# Patient Record
Sex: Female | Born: 1935
Health system: Southern US, Community
[De-identification: ages and names within clinical notes are randomized; demographics above are authoritative.]

## PROBLEM LIST (undated history)

## (undated) DIAGNOSIS — M179 Osteoarthritis of knee, unspecified: Secondary | ICD-10-CM

## (undated) DIAGNOSIS — K635 Polyp of colon: Secondary | ICD-10-CM

## (undated) DIAGNOSIS — E785 Hyperlipidemia, unspecified: Secondary | ICD-10-CM

## (undated) DIAGNOSIS — K5730 Diverticulosis of large intestine without perforation or abscess without bleeding: Secondary | ICD-10-CM

## (undated) DIAGNOSIS — M8580 Other specified disorders of bone density and structure, unspecified site: Secondary | ICD-10-CM

## (undated) DIAGNOSIS — M419 Scoliosis, unspecified: Secondary | ICD-10-CM

## (undated) DIAGNOSIS — IMO0001 Reserved for inherently not codable concepts without codable children: Secondary | ICD-10-CM

## (undated) DIAGNOSIS — D369 Benign neoplasm, unspecified site: Secondary | ICD-10-CM

## (undated) DIAGNOSIS — Z9889 Other specified postprocedural states: Secondary | ICD-10-CM

## (undated) DIAGNOSIS — K589 Irritable bowel syndrome without diarrhea: Secondary | ICD-10-CM

## (undated) DIAGNOSIS — K219 Gastro-esophageal reflux disease without esophagitis: Secondary | ICD-10-CM

## (undated) DIAGNOSIS — M1710 Unilateral primary osteoarthritis, unspecified knee: Secondary | ICD-10-CM

## (undated) DIAGNOSIS — M7060 Trochanteric bursitis, unspecified hip: Secondary | ICD-10-CM

## (undated) DIAGNOSIS — G8929 Other chronic pain: Secondary | ICD-10-CM

## (undated) DIAGNOSIS — R2681 Unsteadiness on feet: Secondary | ICD-10-CM

## (undated) DIAGNOSIS — G629 Polyneuropathy, unspecified: Secondary | ICD-10-CM

## (undated) DIAGNOSIS — M26629 Arthralgia of temporomandibular joint, unspecified side: Secondary | ICD-10-CM

## (undated) DIAGNOSIS — M5030 Other cervical disc degeneration, unspecified cervical region: Secondary | ICD-10-CM

## (undated) DIAGNOSIS — R112 Nausea with vomiting, unspecified: Secondary | ICD-10-CM

## (undated) DIAGNOSIS — M79606 Pain in leg, unspecified: Secondary | ICD-10-CM

## (undated) HISTORY — DX: Scoliosis, unspecified: M41.9

## (undated) HISTORY — DX: Arthralgia of temporomandibular joint, unspecified side: M26.629

## (undated) HISTORY — DX: Benign neoplasm, unspecified site: D36.9

## (undated) HISTORY — DX: Trochanteric bursitis, unspecified hip: M70.60

## (undated) HISTORY — DX: Hyperlipidemia, unspecified: E78.5

## (undated) HISTORY — DX: Other specified disorders of bone density and structure, unspecified site: M85.80

## (undated) HISTORY — DX: Polyp of colon: K63.5

## (undated) HISTORY — DX: Osteoarthritis of knee, unspecified: M17.9

## (undated) HISTORY — DX: Diverticulosis of large intestine without perforation or abscess without bleeding: K57.30

## (undated) HISTORY — DX: Irritable bowel syndrome, unspecified: K58.9

## (undated) HISTORY — PX: TOTAL ABDOMINAL HYSTERECTOMY: SHX209

## (undated) HISTORY — DX: Gastro-esophageal reflux disease without esophagitis: K21.9

## (undated) HISTORY — PX: REPLACEMENT TOTAL KNEE: SUR1224

## (undated) HISTORY — DX: Unilateral primary osteoarthritis, unspecified knee: M17.10

## (undated) HISTORY — DX: Pain in leg, unspecified: M79.606

## (undated) HISTORY — PX: HEMORRHOID SURGERY: SHX153

## (undated) HISTORY — DX: Other chronic pain: G89.29

---

## 1999-04-01 ENCOUNTER — Inpatient Hospital Stay (HOSPITAL_COMMUNITY)
Admission: RE | Admit: 1999-04-01 | Discharge: 1999-04-10 | Payer: Self-pay | Admitting: Physical Medicine and Rehabilitation

## 1999-04-01 ENCOUNTER — Encounter: Payer: Self-pay | Admitting: Physical Medicine and Rehabilitation

## 1999-04-02 ENCOUNTER — Encounter: Payer: Self-pay | Admitting: Physical Medicine and Rehabilitation

## 1999-04-03 ENCOUNTER — Encounter: Payer: Self-pay | Admitting: Physical Medicine and Rehabilitation

## 1999-04-09 ENCOUNTER — Encounter: Payer: Self-pay | Admitting: Physical Medicine and Rehabilitation

## 1999-12-02 HISTORY — PX: CEREBRAL ANEURYSM REPAIR: SHX164

## 2004-05-01 ENCOUNTER — Other Ambulatory Visit: Admission: RE | Admit: 2004-05-01 | Discharge: 2004-05-01 | Payer: Self-pay | Admitting: Family Medicine

## 2004-05-01 ENCOUNTER — Encounter (INDEPENDENT_AMBULATORY_CARE_PROVIDER_SITE_OTHER): Payer: Self-pay | Admitting: *Deleted

## 2004-05-01 LAB — CONVERTED CEMR LAB: Pap Smear: NEGATIVE

## 2004-10-11 ENCOUNTER — Ambulatory Visit: Payer: Self-pay | Admitting: Family Medicine

## 2005-10-10 ENCOUNTER — Ambulatory Visit: Payer: Self-pay | Admitting: Family Medicine

## 2005-12-30 ENCOUNTER — Ambulatory Visit: Payer: Self-pay | Admitting: Family Medicine

## 2006-02-26 ENCOUNTER — Ambulatory Visit: Payer: Self-pay | Admitting: Family Medicine

## 2006-08-06 ENCOUNTER — Ambulatory Visit: Payer: Self-pay | Admitting: Rheumatology

## 2006-12-04 ENCOUNTER — Ambulatory Visit: Payer: Self-pay | Admitting: Family Medicine

## 2007-05-26 ENCOUNTER — Ambulatory Visit: Payer: Self-pay | Admitting: Internal Medicine

## 2008-11-20 ENCOUNTER — Ambulatory Visit: Payer: Self-pay | Admitting: Family Medicine

## 2008-12-01 HISTORY — PX: FOOT SURGERY: SHX648

## 2009-02-06 ENCOUNTER — Encounter: Payer: Self-pay | Admitting: Internal Medicine

## 2009-04-25 ENCOUNTER — Encounter: Payer: Self-pay | Admitting: Internal Medicine

## 2009-05-03 ENCOUNTER — Ambulatory Visit: Payer: Self-pay | Admitting: Rheumatology

## 2009-07-24 ENCOUNTER — Ambulatory Visit: Payer: Self-pay | Admitting: Family Medicine

## 2009-07-24 ENCOUNTER — Encounter (INDEPENDENT_AMBULATORY_CARE_PROVIDER_SITE_OTHER): Payer: Self-pay | Admitting: *Deleted

## 2009-07-24 DIAGNOSIS — K5790 Diverticulosis of intestine, part unspecified, without perforation or abscess without bleeding: Secondary | ICD-10-CM | POA: Insufficient documentation

## 2009-07-24 DIAGNOSIS — Z860100 Personal history of colon polyps, unspecified: Secondary | ICD-10-CM | POA: Insufficient documentation

## 2009-07-24 DIAGNOSIS — Z780 Asymptomatic menopausal state: Secondary | ICD-10-CM | POA: Insufficient documentation

## 2009-07-24 DIAGNOSIS — E781 Pure hyperglyceridemia: Secondary | ICD-10-CM | POA: Insufficient documentation

## 2009-07-24 DIAGNOSIS — M1990 Unspecified osteoarthritis, unspecified site: Secondary | ICD-10-CM | POA: Insufficient documentation

## 2009-07-24 DIAGNOSIS — K5730 Diverticulosis of large intestine without perforation or abscess without bleeding: Secondary | ICD-10-CM | POA: Insufficient documentation

## 2009-07-25 ENCOUNTER — Encounter: Payer: Self-pay | Admitting: Family Medicine

## 2009-08-03 ENCOUNTER — Encounter: Payer: Self-pay | Admitting: Family Medicine

## 2009-08-14 ENCOUNTER — Encounter (INDEPENDENT_AMBULATORY_CARE_PROVIDER_SITE_OTHER): Payer: Self-pay | Admitting: *Deleted

## 2009-08-27 ENCOUNTER — Ambulatory Visit: Payer: Self-pay | Admitting: Family Medicine

## 2009-08-27 DIAGNOSIS — M810 Age-related osteoporosis without current pathological fracture: Secondary | ICD-10-CM | POA: Insufficient documentation

## 2009-11-13 ENCOUNTER — Encounter: Payer: Self-pay | Admitting: Family Medicine

## 2009-12-01 HISTORY — PX: COLONOSCOPY: SHX174

## 2009-12-25 ENCOUNTER — Encounter: Payer: Self-pay | Admitting: Family Medicine

## 2009-12-25 ENCOUNTER — Ambulatory Visit: Payer: Self-pay | Admitting: Unknown Physician Specialty

## 2009-12-25 LAB — HM COLONOSCOPY

## 2010-03-20 ENCOUNTER — Encounter: Payer: Self-pay | Admitting: Family Medicine

## 2010-03-26 ENCOUNTER — Ambulatory Visit: Payer: Self-pay | Admitting: Family Medicine

## 2010-03-26 DIAGNOSIS — K589 Irritable bowel syndrome without diarrhea: Secondary | ICD-10-CM | POA: Insufficient documentation

## 2010-03-26 DIAGNOSIS — K3189 Other diseases of stomach and duodenum: Secondary | ICD-10-CM | POA: Insufficient documentation

## 2010-03-26 DIAGNOSIS — K219 Gastro-esophageal reflux disease without esophagitis: Secondary | ICD-10-CM | POA: Insufficient documentation

## 2010-03-26 DIAGNOSIS — R1013 Epigastric pain: Secondary | ICD-10-CM

## 2010-04-24 ENCOUNTER — Encounter: Payer: Self-pay | Admitting: Family Medicine

## 2010-05-28 ENCOUNTER — Telehealth: Payer: Self-pay | Admitting: Family Medicine

## 2010-08-09 ENCOUNTER — Telehealth (INDEPENDENT_AMBULATORY_CARE_PROVIDER_SITE_OTHER): Payer: Self-pay | Admitting: *Deleted

## 2010-08-13 ENCOUNTER — Ambulatory Visit: Payer: Self-pay | Admitting: Family Medicine

## 2010-08-13 LAB — CONVERTED CEMR LAB
ALT: 12 units/L (ref 0–35)
AST: 15 units/L (ref 0–37)
Albumin: 3.8 g/dL (ref 3.5–5.2)
BUN: 11 mg/dL (ref 6–23)
Basophils Absolute: 0 10*3/uL (ref 0.0–0.1)
Basophils Relative: 0.5 % (ref 0.0–3.0)
CO2: 26 meq/L (ref 19–32)
Calcium: 8.9 mg/dL (ref 8.4–10.5)
Chloride: 107 meq/L (ref 96–112)
Cholesterol: 228 mg/dL — ABNORMAL HIGH (ref 0–200)
Creatinine, Ser: 0.8 mg/dL (ref 0.4–1.2)
Direct LDL: 138.6 mg/dL
Eosinophils Absolute: 0.2 10*3/uL (ref 0.0–0.7)
Eosinophils Relative: 2.2 % (ref 0.0–5.0)
GFR calc non Af Amer: 76.18 mL/min (ref >60)
Glucose, Bld: 83 mg/dL (ref 70–99)
HCT: 38 % (ref 36.0–46.0)
HDL: 40.7 mg/dL (ref >39.00)
Hemoglobin: 13.1 g/dL (ref 12.0–15.0)
Lymphocytes Relative: 22.7 % (ref 12.0–46.0)
Lymphs Abs: 1.6 10*3/uL (ref 0.7–4.0)
MCHC: 34.6 g/dL (ref 30.0–36.0)
MCV: 88 fL (ref 78.0–100.0)
Monocytes Absolute: 0.4 10*3/uL (ref 0.1–1.0)
Monocytes Relative: 6.2 % (ref 3.0–12.0)
Neutro Abs: 4.7 10*3/uL (ref 1.4–7.7)
Neutrophils Relative %: 68.4 % (ref 43.0–77.0)
Phosphorus: 3.2 mg/dL (ref 2.3–4.6)
Platelets: 268 10*3/uL (ref 150.0–400.0)
Potassium: 3.8 meq/L (ref 3.5–5.1)
RBC: 4.32 M/uL (ref 3.87–5.11)
RDW: 13.9 % (ref 11.5–14.6)
Sodium: 142 meq/L (ref 135–145)
TSH: 1.23 microintl units/mL (ref 0.35–5.50)
Total CHOL/HDL Ratio: 6
Triglycerides: 268 mg/dL — ABNORMAL HIGH (ref 0.0–149.0)
VLDL: 53.6 mg/dL — ABNORMAL HIGH (ref 0.0–40.0)
WBC: 6.9 10*3/uL (ref 4.5–10.5)

## 2010-08-14 LAB — CONVERTED CEMR LAB: Vit D, 25-Hydroxy: 12 ng/mL — ABNORMAL LOW (ref 30–89)

## 2010-08-16 ENCOUNTER — Ambulatory Visit: Payer: Self-pay | Admitting: Family Medicine

## 2010-08-16 DIAGNOSIS — E559 Vitamin D deficiency, unspecified: Secondary | ICD-10-CM | POA: Insufficient documentation

## 2010-08-21 ENCOUNTER — Telehealth: Payer: Self-pay | Admitting: Family Medicine

## 2010-08-22 ENCOUNTER — Encounter: Payer: Self-pay | Admitting: Family Medicine

## 2010-08-23 ENCOUNTER — Telehealth: Payer: Self-pay | Admitting: Family Medicine

## 2010-08-24 ENCOUNTER — Ambulatory Visit: Payer: Self-pay | Admitting: Family Medicine

## 2010-09-04 ENCOUNTER — Encounter: Payer: Self-pay | Admitting: Family Medicine

## 2010-09-05 ENCOUNTER — Telehealth: Payer: Self-pay | Admitting: Family Medicine

## 2010-09-13 ENCOUNTER — Telehealth: Payer: Self-pay | Admitting: Family Medicine

## 2010-09-13 DIAGNOSIS — N63 Unspecified lump in unspecified breast: Secondary | ICD-10-CM | POA: Insufficient documentation

## 2010-09-20 ENCOUNTER — Telehealth: Payer: Self-pay | Admitting: Family Medicine

## 2010-09-24 ENCOUNTER — Encounter: Payer: Self-pay | Admitting: Family Medicine

## 2010-11-12 ENCOUNTER — Ambulatory Visit: Payer: Self-pay | Admitting: Family Medicine

## 2010-11-13 ENCOUNTER — Encounter: Payer: Self-pay | Admitting: Family Medicine

## 2010-11-18 LAB — CONVERTED CEMR LAB: Vit D, 25-Hydroxy: 15 ng/mL — ABNORMAL LOW (ref 30–89)

## 2010-12-10 ENCOUNTER — Ambulatory Visit
Admission: RE | Admit: 2010-12-10 | Discharge: 2010-12-10 | Payer: Self-pay | Source: Home / Self Care | Attending: Family Medicine | Admitting: Family Medicine

## 2010-12-10 DIAGNOSIS — M545 Low back pain, unspecified: Secondary | ICD-10-CM | POA: Insufficient documentation

## 2010-12-13 ENCOUNTER — Telehealth: Payer: Self-pay | Admitting: Family Medicine

## 2010-12-31 NOTE — Letter (Signed)
Summary: [facility]/Recheck-Rheumatology/Dr. [facility]/Recheck-[facility]heumatology/Dr. [facility]   Imported By: Eleonore Chiquito 02/13/2009 14:16:51  _____________________________________________________________________  External Attachment:    Type:   Image     Comment:   External Document  Appended Document: [facility]/Recheck-Rheumatology/Dr. [facility] right shoulder injected for subacromial bursitis

## 2010-12-31 NOTE — Assessment & Plan Note (Signed)
Summary: follow up - mult issues   Vital Signs:  Patient profile:   77 year old female Height:      61.25 inches Weight:      178 pounds BMI:     33.48 Temp:     98 degrees F oral Pulse rate:   96 / minute Pulse rhythm:   regular BP sitting:   130 / 80  (left arm) Cuff size:   regular  Vitals Entered By: Liane Comber CMA (July 24, 2009 2:12 PM)  History of Present Illness: here for check up today- mult med issues - and review health mt list  has been feeling ok overall  is worried about wt gain - but wt is stable   has bad diet -- overall some junk food and does not watch it  has not been active -- foot surgery on both feet- is getting better  still some pain- but thinks she could start some exercise- walking  rides a stationary bike on and off  does not tolerate much    lipids -- in past LDL in 130s does not watch diet and not motivated to do so   injured her rotator cuff and had to PT which has helped   bp is good  colonosc 8/05(Dr Markham Jordan)- had polyp and mother had colon ca   last mam 01 -- ? if had one and did not send to me  needs to schedule no lumps on self exam   pap 05 - and no problems at all  no hx of abn paps and has hx of hyst   long time since bone density test -- ? 10 years  takes ca and D occas -does not remember  lost 2 inches of height at least  has not broken any bones   has urgent stools after she eats  ? IBS  is afraid to go anywhere- occ has accidents takes kaopectate occ - if she has to go somewhere   does not want pneumonia vaccine   already had shingles years ago -- does not think she wants vaccine        Allergies (verified): No Known Drug Allergies  Past History:  Family History: Last updated: 07/24/2009 Mother- Colon CA Brother- CVA? HTN, heart problem Brother- ColonCA Maternal Aunt- Colon [place]  Social History: Last updated: 07/24/2009 Retired Married Former Smoker Alcohol use-no Drug use-no Regular  exercise-yes  Risk Factors: Exercise: yes (07/24/2009)  Risk Factors: Smoking Status: quit (07/24/2009)  Past Medical History: GERD, reflux esophagitis/ duodenitis Chronic leg pain/ troch bursitis Osteoarthritis -knees TMJ Seb dermatitis Hyperlipidemia Colonic polyps, hx of Diverticulosis, colon internal  adenoma hemorrhoids scoliosis chronic diarrhea/urgent stools (? IBS)  Past Surgical History: Hysterectomy- total surgery- cerebral/ aneurysm hemprrhoid surgery Dexa Colonoscopy([facility]) colonoscopy (polyp int hemm, diverticulosis) 8/05 EGD- esophagitis foot surg bilat 2010 - hammer toes and "knot"  Review of Systems General:  Denies fatigue, fever, loss of appetite, and malaise. Eyes:  Denies eye pain. CV:  Denies chest pain or discomfort, lightheadness, palpitations, and shortness of breath with exertion. Resp:  Denies cough and wheezing. GI:  Denies abdominal pain, bloody stools, change in bowel habits, and indigestion. GU:  Denies hematuria and urinary frequency. MS:  Denies joint pain, joint redness, and joint swelling. Derm:  Denies itching, lesion(s), poor wound healing, and rash. Neuro:  Denies numbness and tingling. Psych:  mood is generally ok . Endo:  Denies cold intolerance, excessive thirst, excessive urination, and heat intolerance. Heme:  Denies abnormal  bruising and bleeding.  Physical Exam  General:  overweight but generally well appearing  Head:  normocephalic, atraumatic, and no abnormalities observed.   Eyes:  vision grossly intact, pupils equal, pupils round, and pupils reactive to light.  no conjunctival pallor, injection or icterus  Ears:  R ear normal and L ear normal.   Nose:  no nasal discharge.   Mouth:  pharynx pink and moist.   Neck:  supple with full rom and no masses or thyromegally, no JVD or carotid bruit  Chest Wall:  No deformities, masses, or tenderness noted. Breasts:  No mass, nodules, thickening, tenderness, bulging,  retraction, inflamation, nipple discharge or skin changes noted.   Lungs:  Normal respiratory effort, chest expands symmetrically. Lungs are clear to auscultation, no crackles or wheezes. Heart:  Normal rate and regular rhythm. S1 and S2 normal without gallop, murmur, click, rub or other extra sounds. Abdomen:  Bowel sounds positive,abdomen soft and non-tender without masses, organomegaly or hernias noted. no renal bruits  Msk:  No deformity or scoliosis noted of thoracic or lumbar spine.  no acute joint changes  Pulses:  R and L carotid,radial,femoral,dorsalis pedis and posterior tibial pulses are full and equal bilaterally Extremities:  No clubbing, cyanosis, edema, or deformity noted with normal full range of motion of all joints.   Neurologic:  sensation intact to light touch, gait normal, and DTRs symmetrical and normal.   Skin:  many SKs and angiomas  lentigos diffusely Cervical Nodes:  No lymphadenopathy noted Axillary Nodes:  No palpable lymphadenopathy Inguinal Nodes:  No significant adenopathy Psych:  normal affect, talkative and pleasant    Impression & Recommendations:  Problem # 1:  COLONIC POLYPS, HX OF (ICD-V12.72) Assessment Comment Only will refer for 5 year colonoscopy- also in light of family hx  urgent stools have not changed/see below  Orders: Gastroenterology Referral (GI)  Problem # 2:  DIARRHEA, PERSISTENT (ICD-787.91) Assessment: Unchanged chronic urgent loose stools given handout on IBS disc fiber supplementation to try also for colonosc - can disc further with Dr Mechele Collin  no blood or pain  included cbc and tsh and bmet in lab orders  Problem # 3:  SPECIAL SCREENING FOR OSTEOPOROSIS (ICD-V82.81) Assessment: Comment Only is due for screening dexa , pt is post menopausal also mother had OP-per pt report  disc imp of ca and vit D and wt bearing exercise some ht loss but no fx in past Orders: Radiology Referral (Radiology)  Problem # 4:  HYPERLIPIDEMIA  (ICD-272.4) Assessment: Unchanged per pt - not watching diet  adv low sat fat diet  given order for [facility] to check lipids and other labs   Problem # 5:  OTHER SCREENING MAMMOGRAM (ICD-V76.12) Assessment: Comment Only annual mammogram scheduled adv pt to continue regular self breast exams non remarkable breast exam today  Orders: Radiology Referral (Radiology)  Complete Medication List: 1)  Tylenol Extra Strength 500 Mg Tabs (Acetaminophen) .... As needed pain  Patient Instructions: 1)  the current recommendation for calcium intake is 1200-1500 mg daily with 1000 iIU of vitamin D 2)  for urgent stools -- get citrucel fiber supplement - mix with water and take once daily (this may slow down stools) 3)  if you are interested in a pneumonia vaccine in futrue - please call to schedule  4)  if you are interested in shingles vaccine in the future - call your insurance to check on coverage and call us to schedule  5)  we will schedule mammogram  and bone density test at check out  6)  we will refer you to GI for colonoscopy at check out  7)  here is an order to take to [facility] to have your blood drawn   Prior Medications (reviewed today): TYLENOL EXTRA STRENGTH 500 MG TABS (ACETAMINOPHEN) as needed pain Current Allergies (reviewed today): No known allergies  Current Medications (including changes made in today's visit):  TYLENOL EXTRA STRENGTH 500 MG TABS (ACETAMINOPHEN) as needed pain

## 2010-12-31 NOTE — Assessment & Plan Note (Signed)
Summary: SINUS COUGH   Vital Signs:  Patient Profile:   77 Years Old Female Weight:      174.38 pounds Temp:     98.2 degrees F oral Pulse rate:   76 / minute BP sitting:   144 / 68  (left arm)  Vitals Entered By: Wandra Mannan (May 26, 2007 10:35 AM)               Chief Complaint:  sinus and cough.  History of Present Illness: Feels lousy Fighting a sinus infection for about a week Up all night coughing--keeping husband up also No fever, chills or sweats Cough is mostly from PND and gets worse when laying down Trying saline spray and claritin but not working too well No SOB but has to breathe through her mouth Throat is sore from cough Slight ear pain--intermittent Clear rhinorrhea--swallows mucus from cough  No stomach trouble       Review of Systems      See HPI   Physical Exam  General:     NAD Head:     + maxillary sinus tenderness Ears:     R ear normal and L ear normal.   Nose:     marked inflammation bilat Mouth:     no erythema and no exudates.   Neck:     supple and no cervical lymphadenopathy.   Lungs:     normal respiratory effort and normal breath sounds.      Impression & Recommendations:  Problem # 1:  SINUSITIS, ACUTE (ICD-461.9) Assessment: New Duration at least 1 week and worsening Needs relief from night cough also Can use analgesics also--uses tylenol  Her updated medication list for this problem includes:    Amoxicillin 500 Mg Caps (Amoxicillin) .Marland Kitchen... 2 two times a day for 10 days    Codiclear Dh 3.5-300 Mg/25ml Syrp (Hydrocodone-guaifenesin) .Marland Kitchen... 2 teaspoons at bedtime as needed for  severe cough   Medications Added to Medication List This Visit: 1)  Amoxicillin 500 Mg Caps (Amoxicillin) .... 2 two times a day for 10 days 2)  Codiclear Dh 3.5-300 Mg/3ml Syrp (Hydrocodone-guaifenesin) .... 2 teaspoons at bedtime as needed for  severe cough   Patient Instructions: 1)  Please schedule a follow-up appointment as  needed. 2)  Take 650-1000mg  of Tylenol every 4-6 hours as needed for relief of pain or comfort of fever AVOID taking more than 4000mg   in a 24 hour period (can cause liver damage in higher doses).    Prescriptions: CODICLEAR DH 3.5-300 MG/5ML  SYRP (HYDROCODONE-GUAIFENESIN) 2 teaspoons at bedtime as needed for  severe cough  #8oz x 0   Entered and Authorized by:   Cindee Salt MD   Signed by:   Cindee Salt MD on 05/26/2007   Method used:   Print then Give to Patient   RxID:   [id number] AMOXICILLIN 500 MG  CAPS (AMOXICILLIN) 2 two times a day for 10 days  #40 x 0   Entered and Authorized by:   Cindee Salt MD   Signed by:   Cindee Salt MD on 05/26/2007   Method used:   Print then Give to Patient   RxID:   [id number]

## 2010-12-31 NOTE — Consult Note (Signed)
Summary: [facility]  [facility]   Imported By: Maryln Gottron 10/02/2010 13:24:58  _____________________________________________________________________  External Attachment:    Type:   Image     Comment:   External Document

## 2010-12-31 NOTE — Progress Notes (Signed)
Summary: ??celebrex  Phone Note Call from Patient Call back at Home Phone [phone]   Caller: Patient Call For: [name] MD Summary of Call: Patient was seen by Orthopedic dr. this morning. They prescribed her celebrex for the arthritis in her knees. Julie [name] wants to know your opinion of this before starting the med. Please advise.  Initial call taken by: Melody Comas,  May 28, 2010 1:48 PM  Follow-up for Phone Call        this is an anti inflammatory -- possible side effects include stomach pain or ulcers/ worse gerd/ leg swelling/ increase in blood pressure  continue her pepcid and if GI upset stop it  watch for other side effects  do not mix with other nsaids (I see arthrotek on her list )  use with caution  Follow-up by: [name] MD,  May 28, 2010 4:58 PM  Additional Follow-up for Phone Call Additional follow up Details #1::        Patient notified as instructed by telephone. Lewanda Rife LPN  May 28, 2010 5:10 PM

## 2010-12-31 NOTE — Assessment & Plan Note (Signed)
Summary: GO OVER BONE DENSITY TEST/DLO   Vital Signs:  Patient profile:   77 year old female Height:      61.25 inches Weight:      177 pounds BMI:     33.29 Temp:     98.1 degrees F oral Pulse rate:   80 / minute Pulse rhythm:   regular BP sitting:   120 / 76  (left arm) Cuff size:   regular  Vitals Entered By: Liane Comber CMA Duncan Dull) (August 27, 2009 11:59 AM)  History of Present Illness: here for f/u of dexa had it in sept 2010- showed significant osteopenia   thinks she has lost 2 in of ht  LS T score -2.06 and left FN Tscore -2.27  fam hx-- mother had OP with hip fx  had a personal fracture in her wrist over 15 years ago    ca and D is taking that now  takes 2 (ca 1000, and 400 international units vit D) daily  also takes mg   is riding stationary bike is also working with resistance bands for her shoulder takes 2 tylenol each night - this helps   does occasionally have heartburn with spicy foods or greasy foods (does still eat some fried chicken) hot dogs too   no hx of jaw tumor or problem     Allergies: No Known Drug Allergies  Past History:  Past Medical History: Last updated: 08/14/2009 GERD, reflux esophagitis/ duodenitis Chronic leg pain/ troch bursitis Osteoarthritis -knees TMJ Seb dermatitis Hyperlipidemia Colonic polyps, hx of Diverticulosis, colon internal  adenoma hemorrhoids scoliosis chronic diarrhea/urgent stools (? IBS) osteopenia   Past Surgical History: Last updated: 08/14/2009 Hysterectomy- total surgery- cerebral/ aneurysm hemprrhoid surgery Dexa Colonoscopy([place]) colonoscopy (polyp int hemm, diverticulosis) 8/05 EGD- esophagitis foot surg bilat 2010 - hammer toes and "knot" dexa 9/10- osteopenia   Family History: Last updated: 08/27/2009 Mother- Colon CA, OP with hip fx Brother- CVA? HTN, heart problem Brother- ColonCA Maternal Aunt- Colon [place]  Social History: Last updated:  07/24/2009 Retired Married Former Smoker Alcohol use-no Drug use-no Regular exercise-yes  Risk Factors: Exercise: yes (07/24/2009)  Risk Factors: Smoking Status: quit (07/24/2009)  Family History: Mother- Colon [place], OP with hip fx Brother- CVA? HTN, heart problem Brother- ColonCA Maternal Aunt- Colon [place]  Review of Systems General:  Denies fatigue, loss of appetite, and malaise. Eyes:  Denies blurring and eye irritation. CV:  Denies palpitations. Resp:  Denies cough and wheezing. GI:  Complains of indigestion; denies abdominal pain and change in bowel habits; heartburn only if she eats spicy or fatty foods . MS:  Complains of joint pain; knees and feet bother her to walk .  Physical Exam  General:  overweight but generally well appearing  Eyes:  vision grossly intact, pupils equal, pupils round, and pupils reactive to light.   Neck:  nl rom  Msk:  nl rom spine  no bony tenderness  has rounded shoulders- but no kyphosis petite frame  Skin:  Intact without suspicious lesions or rashes fair complexion Cervical Nodes:  No lymphadenopathy noted Psych:  nl affect    Impression & Recommendations:  Problem # 1:  OSTEOPENIA (ICD-733.90) Assessment New disc osteopenia and fx risk  rec made for ca and D (for D - over 1000 international units per day) disc wt bearing exercise she can tolerate  start fosamax- adv if any side eff/ esp reflux-- to stop it and update me  evista would be another opt  disc safety issues/fall  risks info given - handout aafp does have addn risk of fam hx  Her updated medication list for this problem includes:    Fosamax 70 Mg Tabs (Alendronate sodium) .Marland Kitchen... 1 by mouth once per week as directed  Complete Medication List: 1)  Tylenol Extra Strength 500 Mg Tabs (Acetaminophen) .... As needed pain 2)  Calcium 1000 Mg and Vit D 400 Iu  .Marland Kitchen.. 1 by mouth two times a day 3)  Vitamin D3 400 Iu  .Marland Kitchen.. 1 by mouth once daily 4)  Fosamax 70 Mg Tabs  (Alendronate sodium) .Marland Kitchen.. 1 by mouth once per week as directed  Patient Instructions: 1)  continue your current ca and vit D supplement 2)  please buy extra vitamin D 3 supplement 400 international units daily  3)  exercise is good for your bones- especially walking  4)  start fosamax as directed  5)  if any side effects - including heartburn or reflux - stop it and let me know  6)  we will plan your next dexa scan / bone density test in 2 years  Prescriptions: FOSAMAX 70 MG TABS (ALENDRONATE SODIUM) 1 by mouth once per week as directed  #4 x 11   Entered and Authorized by:   [name] MD   Signed by:   [name] MD on 08/27/2009   Method used:   Print then Give to Patient   RxID:   [id number]

## 2010-12-31 NOTE — Assessment & Plan Note (Signed)
Summary: cold/dlo   Vital Signs:  Julie [name] Profile:   77 Years Old Female Weight:      178.38 pounds Temp:     98 degrees F oral Pulse rate:   88 / minute Pulse rhythm:   regular BP sitting:   148 / 90  (left arm) Cuff size:   regular  Vitals Entered ByMarland Kitchen Delilah Shan (November 20, 2008 12:27 PM)                 Chief Complaint:  Cold & URI symptoms.  History of Present Illness: 77 year old Julie [name] here for acute visit:  Acute Visit History:      The Julie [name] complains of cough, headache, nasal discharge, sinus problems, and sore throat.  She denies abdominal pain, chest pain, constipation, diarrhea, earache, eye symptoms, fever, genitourinary symptoms, musculoskeletal symptoms, nausea, rash, and vomiting.  Other comments include: Started on Friday Children and grandkids coming into town Awake all night headache st cough nonprod. no tob .        The cough interferes with her sleep.  There is no history of wheezing, shortness of breath, respiratory retractions, tachypnea, cyanosis, or interference with oral intake associated with her cough.        'Cold' or URI symptoms have been present with the sore throat.  There is no history of dysphagia, drooling, or recent exposure to strep.        She complains of sinus pressure, teeth aching, nasal congestion, and frontal headache.  The Julie [name] has had a past history of sinusitis.  She denies previous sinus surgery or sinusitis in the last 2 months.        Urine output has been normal.  She is tolerating clear liquids.              Review of Systems      See HPI   Physical Exam  General:     Well-developed,well-nourished,in no acute distress; alert,appropriate and cooperative throughout examination Head:     Normocephalic and atraumatic without obvious abnormalities. No apparent alopecia or balding. B maxillary sinuses mod tender to palpate Ears:     R ear more dull and slightly red compared to the L Nose:     no  external deformity.   Mouth:     Oral mucosa and oropharynx without lesions or exudates.  Teeth in good repair. Neck:     No deformities, masses, or tenderness noted. Lungs:     Normal respiratory effort, chest expands symmetrically. Lungs are clear to auscultation, no crackles or wheezes. Heart:     Normal rate and regular rhythm. S1 and S2 normal without gallop, murmur, click, rub or other extra sounds. Abdomen:     Bowel sounds positive,abdomen soft and non-tender without masses, organomegaly or hernias noted.    Impression & Recommendations:  Problem # 1:  URI (ICD-465.9) Assessment: New Treat with supportive care, fluids, Tylenol, Motrin Tessalon as needed  Nyquil at night   Some developing sinus pain - given closeness to Christmas, given ABX paper and told to not fill unless acutely worsening at end of week  The following medications were removed from the medication list:    Codiclear Dh 3.5-300 Mg/69ml Syrp (Hydrocodone-guaifenesin) .Marland Kitchen... 2 teaspoons at bedtime as needed for  severe cough  Her updated medication list for this problem includes:    Tessalon 200 Mg Caps (Benzonatate) .Marland Kitchen... Take one capsule by mouth three times a day as needed for cough   Complete Medication  List: 1)  Tessalon 200 Mg Caps (Benzonatate) .... Take one capsule by mouth three times a day as needed for cough 2)  Azithromycin 250 Mg Tabs (Azithromycin) .... 2 by  mouth today and then 1 daily for 4 days    Prescriptions: AZITHROMYCIN 250 MG  TABS (AZITHROMYCIN) 2 by  mouth today and then 1 daily for 4 days  #6 x 0   Entered and Authorized by:   Hannah Beat MD   Signed by:   Hannah Beat MD on 11/20/2008   Method used:   Print then Give to Julie [name]   RxID:   [id number] TESSALON 200 MG CAPS (BENZONATATE) Take one capsule by mouth three times a day as needed for cough  #40 x 0   Entered and Authorized by:   Hannah Beat MD   Signed by:   Hannah Beat MD on 11/20/2008   Method  used:   Print then Give to Julie [name]   RxID:   [id number]  ]  Current Medications (including changes made in today's visit):  TESSALON 200 MG CAPS (BENZONATATE) Take one capsule by mouth three times a day as needed for cough AZITHROMYCIN 250 MG  TABS (AZITHROMYCIN) 2 by  mouth today and then 1 daily for 4 days

## 2010-12-31 NOTE — Letter (Signed)
Summary: [facility]  [facility]   Imported By: Lanelle Bal 04/01/2010 09:16:40  _____________________________________________________________________  External Attachment:    Type:   Image     Comment:   External Document

## 2010-12-31 NOTE — Assessment & Plan Note (Signed)
Summary: cpx/rbh   Vital Signs:  Patient profile:   77 year old female Height:      61.25 inches Weight:      180.25 pounds BMI:     33.90 Temp:     98.3 degrees F oral Pulse rate:   76 / minute Pulse rhythm:   regular BP sitting:   122 / 80  (left arm) Cuff size:   regular  Vitals Entered By: Lewanda Rife LPN (August 16, 2010 10:31 AM) CC: check up / past hysterect   History of Present Illness: here for check up of chronic med problems and to review health mt list  is ok except for allergies and post nasal drip and cough  is taking claritin  also not sleeping at night  cough is much worse this week- sometimes with white phlegm feels a little worn out but no fever no n/v  wt is down 2 lb with bmi of 33  122/80- good bp  lipids-- are a bit high trig 268, 40, 138  diet is fair -- did try to quit eating fried foods   osteopenia 9/10 ca and vit D-- not taking but got off schedule  has ibs with diarrhea -- needs to f/u with her GI - Markham Jordan  dairy makes diarrhea better     tot hyst in past  pap nl in 05  colonosc 1/11  mam 9/10- needs to schedule  self exam- no breast lumps   TD 06 flu shot - will wait for flu shot clinic  ? pneumovax--is interested  zoster status   Allergies: 1)  ! Codeine  Past History:  Past Medical History: Last updated: 03/26/2010 GERD, reflux esophagitis/ duodenitis Chronic leg pain/ troch bursitis Osteoarthritis -knees TMJ Seb dermatitis Hyperlipidemia Colonic polyps, hx of Diverticulosis, colon internal----------------------GI Dr Mechele Collin  adenoma hemorrhoids IBS wth pp diarrhea   scoliosis chronic diarrhea/urgent stools (? IBS) osteopenia   Past Surgical History: Last updated: 03/26/2010 Hysterectomy- total surgery- cerebral/ aneurysm hemprrhoid surgery Dexa Colonoscopy([place]) colonoscopy (polyp int hemm, diverticulosis) 8/05 EGD- esophagitis foot surg bilat 2010 - hammer toes and "knot" dexa 9/10-  osteopenia  1/11 colonosc - polyps - hyperplastic and adenomatous   Family History: Last updated: 08/27/2009 Mother- Colon CA, OP with hip fx Brother- CVA? HTN, heart problem Brother- ColonCA Maternal Aunt- Colon CA  Social History: Last updated: 07/24/2009 Retired Married Former Smoker Alcohol use-no Drug use-no Regular exercise-yes  Risk Factors: Exercise: yes (07/24/2009)  Risk Factors: Smoking Status: quit (07/24/2009)  Review of Systems General:  Denies fatigue, fever, loss of appetite, and malaise. Eyes:  Denies blurring and eye irritation. CV:  Denies chest pain or discomfort, palpitations, and shortness of breath with exertion. Resp:  Denies cough, shortness of breath, and wheezing. GI:  Complains of diarrhea; denies abdominal pain, bloody stools, change in bowel habits, and indigestion. GU:  Denies abnormal vaginal bleeding, discharge, dysuria, and urinary frequency. MS:  Denies joint pain, joint redness, and joint swelling. Derm:  Denies itching, lesion(s), poor wound healing, and rash. Neuro:  Denies numbness and tingling. Psych:  Denies anxiety and depression. Endo:  Denies cold intolerance, excessive thirst, excessive urination, and heat intolerance. Heme:  Denies abnormal bruising and bleeding.  Physical Exam  General:  overweight but generally well appearing  Head:  normocephalic, atraumatic, and no abnormalities observed.   Eyes:  vision grossly intact, pupils equal, pupils round, and pupils reactive to light.  no conjunctival pallor, injection or icterus  Ears:  R ear normal  and L ear normal.   Nose:  nares are boggy but clear  Mouth:  pharynx pink and moist, no erythema, and no exudates.   Neck:  supple with full rom and no masses or thyromegally, no JVD or carotid bruit  Chest Wall:  No deformities, masses, or tenderness noted. Breasts:  No mass, nodules, thickening, tenderness, bulging, retraction, inflamation, nipple discharge or skin changes  noted.   Lungs:  CTA with good air exch  no rhonchi or wheeze harsh cough  mild rales heard at R base  Heart:  Normal rate and regular rhythm. S1 and S2 normal without gallop, murmur, click, rub or other extra sounds. Abdomen:  Bowel sounds positive,abdomen soft and non-tender without masses, organomegaly or hernias noted. no renal bruits  Msk:  No deformity or scoliosis noted of thoracic or lumbar spine.  no acute joint changes  Pulses:  R and L carotid,radial,femoral,dorsalis pedis and posterior tibial pulses are full and equal bilaterally Extremities:  No clubbing, cyanosis, edema, or deformity noted with normal full range of motion of all joints.   Neurologic:  sensation intact to light touch, gait normal, and DTRs symmetrical and normal.   Skin:  Intact without suspicious lesions or rashes Cervical Nodes:  No lymphadenopathy noted Axillary Nodes:  No palpable lymphadenopathy Inguinal Nodes:  No significant adenopathy Psych:  normal affect, talkative and pleasant    Impression & Recommendations:  Problem # 1:  OTHER SCREENING MAMMOGRAM (ICD-V76.12) Assessment Comment Only annual mammogram scheduled adv pt to continue regular self breast exams non remarkable breast exam today  Orders: Radiology Referral (Radiology) Prescription Created Electronically [phone])  Problem # 2:  IRRITABLE BOWEL SYNDROME (ICD-564.1) Assessment: Deteriorated  this is worse - adv to f/u with GI continue fiber calcium may also help the diarrhea  Orders: Prescription Created Electronically [phone])  Problem # 3:  OSTEOPENIA (ICD-733.90) Assessment: Unchanged  no problems with fosamax tx vit D def start back on ca  dexa next fall  Her updated medication list for this problem includes:    Fosamax 70 Mg Tabs (Alendronate sodium) .Marland Kitchen... 1 by mouth once per week as directed    Ergocalciferol 50000 Unit Caps (Ergocalciferol) .Marland Kitchen... 1 by mouth once weekly for 12 weeks  Orders: Prescription Created  Electronically [phone])  Problem # 4:  UNSPECIFIED VITAMIN D DEFICIENCY (ICD-268.9) Assessment: New  tx this in light of low bone mass 50.000 international units weekly for 12 weeks and then re check  Orders: Prescription Created Electronically [phone])  Problem # 5:  PNEUMONIA, RIGHT LOWER LOBE (ICD-486) Assessment: New  by exam - I suspect very mild/ early pneumonia in R lobe (crackles) cover with zithromax and watch closely 2 wk re check  put off pneumovax until better  pt advised to update me if symptoms worsen or do not improve  Her updated medication list for this problem includes:    Zithromax Z-pak 250 Mg Tabs (Azithromycin) .Marland Kitchen... Take by mouth as directed  Orders: Prescription Created Electronically [phone])  Problem # 6:  HYPERLIPIDEMIA (ICD-272.4) Assessment: Unchanged  lipids mildly high rev low sat fat diet  will continue to follow  Labs Reviewed: SGOT: 15 (08/13/2010)   SGPT: 12 (08/13/2010)   HDL:40.70 (08/13/2010)  Chol:228 (08/13/2010)  Trig:268.0 (08/13/2010)  Orders: Prescription Created Electronically [phone])  Complete Medication List: 1)  Tylenol Extra Strength 500 Mg Tabs (Acetaminophen) .... As needed pain 2)  Calcium 1000 Mg and Vit D 400 Iu  .Marland Kitchen.. 1 by mouth two times a day 3)  Vitamin D3 400 Iu  .Marland Kitchen.. 1 by mouth once daily 4)  Fosamax 70 Mg Tabs (Alendronate sodium) .Marland Kitchen.. 1 by mouth once per week as directed 5)  Claritin 10 Mg Tabs (Loratadine) .... Otc as directed. 6)  Arthrotec 75-200 Mg-mcg Tabs (Diclofenac-misoprostol) .... Take one tablet two times a day with food as needed pain 7)  Pepcid Ac 10 Mg Tabs (Famotidine) .Marland Kitchen.. 1 by mouth once daily 8)  Zithromax Z-pak 250 Mg Tabs (Azithromycin) .... Take by mouth as directed 9)  Ergocalciferol 50000 Unit Caps (Ergocalciferol) .Marland Kitchen.. 1 by mouth once weekly for 12 weeks  Patient Instructions: 1)  the current recommendation for calcium intake is 1200-1500 mg daily  2)  I recommend you follow up with  Dr Markham Jordan for your IBS problem to address that specifically 3)  you can raise your HDL (good cholesterol) by increasing exercise and eating omega 3 fatty acid supplement like fish oil or flax seed oil over the counter 4)  you can lower LDL (bad cholesterol) by limiting saturated fats in diet like red meat, fried foods, egg yolks, fatty breakfast meats, high fat dairy products and shellfish  5)  if you want shingles vaccine - check with your insurance and call us back in the spring 6)  try zyrtec for post nasal drip instead od claritin  7)  take zithromax for upper respiratory infection - update me if worse 8)  follow up with me for a re check in 2 weeks  9)  take the vitamin D weekly as prescribed  10)  make lab appt in 12 weeks for vitamin D level 11)  we will schedule mammogram at check out Prescriptions: FOSAMAX 70 MG TABS (ALENDRONATE SODIUM) 1 by mouth once per week as directed  #4 x 11   Entered and Authorized by:   [name] MD   Signed by:   [name] MD on 08/16/2010   Method used:   Electronically to        [facility] [address] (retail)       [address], [address]       [place], [place]  [address]       Ph: [phone]       Fax: [phone]   RxID:   [id number] ZITHROMAX Z-PAK 250 MG TABS (AZITHROMYCIN) take by mouth as directed  #1 pack x 0   Entered and Authorized by:   [name] MD   Signed by:   [name] MD on 08/16/2010   Method used:   Electronically to        [facility] [address] (retail)       [address], [address]       [place], [place]  [address]       Ph: [phone]       Fax: [phone]   RxID:   [id number] ERGOCALCIFEROL 50000 UNIT CAPS (ERGOCALCIFEROL) 1 by mouth once weekly for 12 weeks  #12 x 0   Entered and Authorized by:   [name] MD   Signed by:   [name] MD on 08/16/2010   Method used:   Electronically to        [facility]  [address] (retail)       [address]       [place], [place]  [address]       Ph: [phone]       Fax: [phone]   RxID:   [id number]   Current Allergies (reviewed today): ! CODEINE  Appended Document: cpx/rbh Copy of physical faxed to [facility] for pt's appt.

## 2010-12-31 NOTE — Letter (Signed)
Summary: [facility]  [facility]   Imported By: Lanelle Bal 05/08/2010 08:21:16  _____________________________________________________________________  External Attachment:    Type:   Image     Comment:   External Document

## 2010-12-31 NOTE — Progress Notes (Signed)
Summary: not any better  Phone Note Call from Patient Call back at Home Phone [phone]   Caller: Patient Call For: [name] MD Summary of Call: Pt was seen on friday for URI.  She finished abx yesterday.  Still has a bad cough, no fever.  Advised her that abx is still working in her system and that the cough can linger. Advised her to drink plenty of fluids to losen congestion. She is worried she has pneumonia and wants more abx or office visit. Initial call taken by: Lowella Petties CMA,  August 21, 2010 8:32 AM  Follow-up for Phone Call        please have her come in for cxr today -- let me know when she arrives and I will order cxr for question pneumonia - thanks Follow-up by: [name] MD,  August 21, 2010 9:48 AM  Additional Follow-up for Phone Call Additional follow up Details #1::        Pt says she doesnt have time to come in here today, asks if she can go to [facility] tomorrow, she is going for a mammogram.  [facility] does not tend to give me reports in a timely manner- and she needs to know that -- but if that is what she wants to do -- is ok send this back to me in am and I will do the order --MT Additional Follow-up by: Lowella Petties CMA,  August 21, 2010 10:01 AM    Additional Follow-up for Phone Call Additional follow up Details #2::    This is what she needs to do.  Her husband is going to a funeral today and cant bring the pt to the office.      Lowella Petties CMA  August 21, 2010 11:02 AM  ok -- I will go ahead and do order so she can schedule with [facility] tomorrow  Follow-up by: [name] MD,  August 21, 2010 11:03 AM  Additional Follow-up for Phone Call Additional follow up Details #3:: Details for Additional Follow-up Action Taken: [facility] for pt to call.             Lowella Petties CMA  August 21, 2010 11:16 AM  Advised pt she can call [facility] and scheule cxr around mammogram appt time  tomorrow.  Order will be sent over, per Dr. Milinda Antis. Additional Follow-up by: Lowella Petties CMA,  August 21, 2010 12:29 PM

## 2010-12-31 NOTE — Assessment & Plan Note (Signed)
Summary: RECHECK CONGESTION PER DR TOWER/RBH   Vital Signs:  Patient profile:   77 year old female Height:      61.25 inches (155.57 cm) Weight:      177.75 pounds (80.80 kg) BMI:     33.43 O2 Sat:      91 % on Room air Temp:     98.1 degrees F (36.72 degrees C) oral Pulse rate:   97 / minute BP sitting:   130 / 80  (left arm) Cuff size:   large  Vitals Entered By: Lucious Groves CMA (August 24, 2010 9:28 AM)  O2 Flow:  Room air CC: Re-check congestion per Dr. Imogene Burn Is Patient Diabetic? No Comments Patient notes that she does not feel any better and has a terrible cough that keeps her awake at night. Patient denies HA, fever and color of mucous being produced with cough is unknown./kb   History of Present Illness: Pt seen in Sat morning clinic.   2 week hx of productive cough. No fever but chills off and on. Just finished course of Z-max but no better on antibioitic. Nonsmoker.  No dyspnea. Cough keeping awake at night.  OTC meds no help.  CXR 2 days ago no acute findings. Cough worse at night.  Hx GERD but no active GERD symptoms. Former smoker.  Current Medications (verified): 1)  Tylenol Extra Strength 500 Mg Tabs (Acetaminophen) .... As Needed Pain 2)  Calcium 1000 Mg and Vit D 400 Iu .Marland Kitchen.. 1 By Mouth Two Times A Day 3)  Vitamin D3 400 Iu .Marland Kitchen.. 1 By Mouth Once Daily 4)  Fosamax 70 Mg Tabs (Alendronate Sodium) .Marland Kitchen.. 1 By Mouth Once Per Week As Directed 5)  Claritin 10 Mg Tabs (Loratadine) .... Otc As Directed. 6)  Arthrotec 75-200 Mg-Mcg Tabs (Diclofenac-Misoprostol) .... Take One Tablet Two Times A Day With Food As Needed Pain 7)  Pepcid Ac 10 Mg Tabs (Famotidine) .Marland Kitchen.. 1 By Mouth Once Daily 8)  Ergocalciferol 50000 Unit Caps (Ergocalciferol) .Marland Kitchen.. 1 By Mouth Once Weekly For 12 Weeks  Allergies (verified): 1)  ! Codeine  Past History:  Past Medical History: Last updated: 03/26/2010 GERD, reflux esophagitis/ duodenitis Chronic leg pain/ troch  bursitis Osteoarthritis -knees TMJ Seb dermatitis Hyperlipidemia Colonic polyps, hx of Diverticulosis, colon internal----------------------GI Dr Mechele Collin  adenoma hemorrhoids IBS wth pp diarrhea   scoliosis chronic diarrhea/urgent stools (? IBS) osteopenia   Social History: Last updated: 07/24/2009 Retired Married Former Smoker Alcohol use-no Drug use-no Regular exercise-yes PMH reviewed for relevance  Physical Exam  General:  Well-developed,well-nourished,in no acute distress; alert,appropriate and cooperative throughout examination Ears:  External ear exam shows no significant lesions or deformities.  Otoscopic examination reveals clear canals, tympanic membranes are intact bilaterally without bulging, retraction, inflammation or discharge. Hearing is grossly normal bilaterally. Mouth:  Oral mucosa and oropharynx without lesions or exudates.  Teeth in good repair. Neck:  No deformities, masses, or tenderness noted. Lungs:  Normal respiratory effort, chest expands symmetrically. Lungs are clear to auscultation, no crackles or wheezes. Heart:  Normal rate and regular rhythm. S1 and S2 normal without gallop, murmur, click, rub or other extra sounds.   Impression & Recommendations:  Problem # 1:  ACUTE BRONCHITIS (ICD-466.0) Try Tessalon for cough suppression.  Prior intolerance to codeine. The following medications were removed from the medication list:    Zithromax Z-pak 250 Mg Tabs (Azithromycin) .Marland Kitchen... Take by mouth as directed Her updated medication list for this problem includes:    Benzonatate 200 Mg  Caps (Benzonatate) ..... One by mouth three times a day as needed cough  Complete Medication List: 1)  Tylenol Extra Strength 500 Mg Tabs (Acetaminophen) .... As needed pain 2)  Calcium 1000 Mg and Vit D 400 Iu  .Marland Kitchen.. 1 by mouth two times a day 3)  Vitamin D3 400 Iu  .Marland Kitchen.. 1 by mouth once daily 4)  Fosamax 70 Mg Tabs (Alendronate sodium) .Marland Kitchen.. 1 by mouth once per week as  directed 5)  Claritin 10 Mg Tabs (Loratadine) .... Otc as directed. 6)  Arthrotec 75-200 Mg-mcg Tabs (Diclofenac-misoprostol) .... Take one tablet two times a day with food as needed pain 7)  Pepcid Ac 10 Mg Tabs (Famotidine) .Marland Kitchen.. 1 by mouth once daily 8)  Ergocalciferol 50000 Unit Caps (Ergocalciferol) .Marland Kitchen.. 1 by mouth once weekly for 12 weeks 9)  Benzonatate 200 Mg Caps (Benzonatate) .... One by mouth three times a day as needed cough  Patient Instructions: 1)  Acute Bronchitis symptoms for less then 10 days are not  helped by antibiotics. Take over the counter cough medications. Call if no improvement in 5-7 days, sooner if increasing cough, fever, or new symptoms ( shortness of breath, chest pain) .  Prescriptions: BENZONATATE 200 MG CAPS (BENZONATATE) one by mouth three times a day as needed cough  #30 x 0   Entered and Authorized by:   Evelena Peat MD   Signed by:   Evelena Peat MD on 08/24/2010   Method used:   Electronically to        [facility] [address] ([facility])       [address], [address]       [place], [place]  [address]       Ph: [phone]       Fax: [phone]   RxID:   [id number]

## 2010-12-31 NOTE — Assessment & Plan Note (Signed)
Summary: stomach problems/alc   Vital Signs:  Patient profile:   77 year old female Height:      61.25 inches Weight:      182.25 pounds BMI:     34.28 Temp:     98.6 degrees F oral Pulse rate:   76 / minute Pulse rhythm:   regular BP sitting:   124 / 76  (left arm) Cuff size:   regular  Vitals Entered By: Lewanda Rife LPN (March 26, 2010 12:17 PM) CC: on 03/23/10 had upper stomach pain and vomited x1. No pain or vomiting since.   History of Present Illness: here for GI complaints  saturday - ate out with family  after eating - got epigastric discomfort - then vomited in bathroom ate broccoli cassarole/ beets and fried squash   no episodes since -- feels just fine   she just had a big GI work up  IBS -- has pp diarrhea    GI hx incl recent colonosc with Dr Mechele Collin in [place] - with both adenomatous and hyperplastic polyp/ hx tics/ fam hx colon ca IBS with pp diarrhea remote hx of esophagitis   takes fosamax takes arthrotek - for arthritis in both knees  (also had voltaren gel to use)      Allergies (verified): 1)  ! Codeine  Past History:  Family History: Last updated: 08/27/2009 Mother- Colon CA, OP with hip fx Brother- CVA? HTN, heart problem Brother- ColonCA Maternal Aunt- Colon CA  Social History: Last updated: 07/24/2009 Retired Married Former Smoker Alcohol use-no Drug use-no Regular exercise-yes  Risk Factors: Exercise: yes (07/24/2009)  Risk Factors: Smoking Status: quit (07/24/2009)  Past Medical History: GERD, reflux esophagitis/ duodenitis Chronic leg pain/ troch bursitis Osteoarthritis -knees TMJ Seb dermatitis Hyperlipidemia Colonic polyps, hx of Diverticulosis, colon internal----------------------GI Dr Mechele Collin  adenoma hemorrhoids IBS wth pp diarrhea   scoliosis chronic diarrhea/urgent stools (? IBS) osteopenia   Past Surgical History: Hysterectomy- total surgery- cerebral/ aneurysm hemprrhoid  surgery Dexa Colonoscopy([place]) colonoscopy (polyp int hemm, diverticulosis) 8/05 EGD- esophagitis foot surg bilat 2010 - hammer toes and "knot" dexa 9/10- osteopenia  1/11 colonosc - polyps - hyperplastic and adenomatous   Review of Systems General:  Denies fatigue, fever, loss of appetite, and malaise. Eyes:  Denies blurring and eye pain. CV:  Denies chest pain or discomfort, palpitations, and swelling of feet. Resp:  Denies cough, shortness of breath, sputum productive, and wheezing. GI:  Complains of gas, hemorrhoids, and indigestion; denies bloody stools, dark tarry stools, and vomiting blood. GU:  Denies dysuria and urinary frequency. MS:  Complains of joint pain and stiffness; denies joint redness and joint swelling. Derm:  Denies lesion(s), poor wound healing, and rash. Psych:  Denies anxiety and depression. Endo:  Denies cold intolerance, excessive thirst, excessive urination, and heat intolerance. Heme:  Denies abnormal bruising and bleeding.  Physical Exam  General:  overweight but generally well appearing  Head:  normocephalic, atraumatic, and no abnormalities observed.   Eyes:  vision grossly intact, pupils equal, pupils round, and pupils reactive to light.  no conjunctival pallor, injection or icterus  Mouth:  pharynx pink and moist.   Neck:  supple with full rom and no masses or thyromegally, no JVD or carotid bruit  Chest Wall:  No deformities, masses, or tenderness noted. Lungs:  Normal respiratory effort, chest expands symmetrically. Lungs are clear to auscultation, no crackles or wheezes. Heart:  Normal rate and regular rhythm. S1 and S2 normal without gallop, murmur, click, rub or other  extra sounds. Abdomen:  Bowel sounds positive,abdomen soft and non-tender without masses, organomegaly or hernias noted. Msk:  poor rom knees  Extremities:  No clubbing, cyanosis, edema, or deformity noted with normal full range of motion of all joints.   Neurologic:   sensation intact to light touch, gait normal, and DTRs symmetrical and normal.   Skin:  Intact without suspicious lesions or rashes no pallor or jaundice  Cervical Nodes:  No lymphadenopathy noted Inguinal Nodes:  No significant adenopathy Psych:  normal affect, talkative and pleasant    Impression & Recommendations:  Problem # 1:  DYSPEPSIA (ICD-536.8) Assessment New one episode brief with vomiting- now totally resolved background of IBS and also esophagitis in past (and recent colonosc) disc potential GI irritants like her fosamax and more recent arthrotek adv to hold arthrotek (in favor of voltaren gel) - unless abs necessary  update asap if this happens again - may consider hold fosamax or image gallbladder   Problem # 2:  GERD (ICD-530.81) Assessment: New with indigestion more than twice weekly (and remote hx of esophagitis )  adv she use med (pepcid) daily instead of as needed-- disc why / risks  upate if not imp- would consider change to PPI wt loss would also help  Her updated medication list for this problem includes:    Pepcid Ac 10 Mg Tabs (Famotidine) .Marland Kitchen... 1 by mouth once daily  Problem # 3:  IRRITABLE BOWEL SYNDROME (ICD-564.1) Assessment: New routine post prandial diarrhea with no imp after stopping mag recent colonosc with polyps  I recommend trial of fiber for this daily / citrucel also immodium as needed occ for trips  update if no imp or f/u with GI/ Dr Markham Jordan   Complete Medication List: 1)  Tylenol Extra Strength 500 Mg Tabs (Acetaminophen) .... As needed pain 2)  Calcium 1000 Mg and Vit D 400 Iu  .Marland Kitchen.. 1 by mouth two times a day 3)  Vitamin D3 400 Iu  .Marland Kitchen.. 1 by mouth once daily 4)  Fosamax 70 Mg Tabs (Alendronate sodium) .Marland Kitchen.. 1 by mouth once per week as directed 5)  Claritin 10 Mg Tabs (Loratadine) .... Otc as directed. 6)  Arthrotec 75-200 Mg-mcg Tabs (Diclofenac-misoprostol) .... Take one tablet two times a day with food as needed pain 7)  Pepcid Ac 10  Mg Tabs (Famotidine) .Marland Kitchen.. 1 by mouth once daily  Patient Instructions: 1)  stop the arthrotek - this can cause stomach upset (or only take it when absolutely necessary) 2)  use your voltaren gel instead  3)  if you have another episode of vomiting or pain let me know  4)  take the pepcid every day instead of as needed ( if that does not work will have to try prilosec)  5)  also for irritable bowel - start a fiber supplement daily like citrucel   Current Allergies (reviewed today): ! CODEINE

## 2010-12-31 NOTE — Letter (Signed)
Summary: [facility]  [facility]   Imported By: Lanelle Bal 12/10/2009 10:45:09  _____________________________________________________________________  External Attachment:    Type:   Image     Comment:   External Document

## 2010-12-31 NOTE — Letter (Signed)
Summary: [name]  [facility]   Imported By: Maryln Gottron 05/07/2009 16:00:33  _____________________________________________________________________  External Attachment:    Type:   Image     Comment:   External Document  Appended Document: [name] capsulitis of shoulder sending to PT  tramadol

## 2010-12-31 NOTE — Progress Notes (Signed)
----   Converted from flag ---- ---- 08/08/2010 9:16 PM, Colon Flattery Tower MD wrote: please check lipid/renal/ cbc with diff/ ast/alt/ tsh / vit d level for 272, gerd, osteopenia - thanks  ---- 08/08/2010 11:01 AM, Liane Comber CMA (AAMA) wrote: Lab orders please! Hello! This pt is scheduled for cpx labs next week, which labs to draw and dx codes to use? Thanks Tasha ------------------------------

## 2010-12-31 NOTE — Progress Notes (Signed)
Summary: wants refill on tesselon  Phone Note Call from Patient Call back at Home Phone [phone]   Caller: Patient Call For: [name] MD Summary of Call: [facility].  She was given tesselon, which helped.  She says the cough is back and she is asking if she can get a refill called to [facility] [address]. She says this really helped her last time. Initial call taken by: Lowella Petties CMA,  September 20, 2010 9:19 AM  Follow-up for Phone Call        that is fine - but if cough worsens or becomes productive please f/u px written on EMR for call in  Follow-up by: [name] MD,  September 20, 2010 9:29 AM  Additional Follow-up for Phone Call Additional follow up Details #1::        Medication phoned to [facility] [address] pharmacy as instructed. Patient notified as instructed by telephone. Lewanda Rife LPN  September 20, 2010 10:59 AM     Prescriptions: BENZONATATE 200 MG CAPS (BENZONATATE) one by mouth three times a day as needed cough  #30 x 1   Entered and Authorized by:   [name] MD   Signed by:   Lewanda Rife LPN on 35/57/3220   Method used:   Telephoned to ...       [facility] [address]       [address], [address]       [place], [place]  [address]       Ph: [phone]       Fax: [phone]   RxID:   [id number]

## 2010-12-31 NOTE — Progress Notes (Signed)
Summary: wants surgical consult  Phone Note Call from Patient Call back at Home Phone [phone]   Caller: Patient Call For: [name] MD Summary of Call: Pt has decided that she wants a surgical consult regarding her breast ultrasound.  She wants to see either Dr. Renda Rolls or Dr. Rosezetta Schlatter in [place] and she would like to see someone asap, she has a trip planned in december and wants to be over the bx by then. Initial call taken by: Lowella Petties CMA,  September 13, 2010 8:54 AM  Follow-up for Phone Call        will do ref for [facility] Follow-up by: [name] MD,  September 13, 2010 9:52 AM  New Problems: BREAST MASS, RIGHT (ICD-611.72)   New Problems: BREAST MASS, RIGHT (ICD-611.72)

## 2010-12-31 NOTE — Letter (Signed)
Summary: Generic Letter  [facility]  [address] [place], [place] [address]   Phone: [phone]  Fax: [phone]    08/14/2009   [name] [address] [place], [place]  [address]    Dear Ms. Bryden,     Your mammogram was normal, please repeat screening in one year.    Sincerely,   Liane Comber CMA (AAMA)

## 2010-12-31 NOTE — Procedures (Signed)
Summary: Colonoscopy by Dr[facility]  Colonoscopy by Dr[facility]   Imported By: Beau Fanny 12/28/2009 09:41:12  _____________________________________________________________________  External Attachment:    Type:   Image     Comment:   External Document  Appended Document: Colonoscopy by Dr[facility]Robert [facility]    Clinical Lists Changes  Observations: Added new observation of PAST MED HX: GERD, reflux esophagitis/ duodenitis Chronic leg pain/ troch bursitis Osteoarthritis -knees TMJ Seb dermatitis Hyperlipidemia Colonic polyps, hx of Diverticulosis, colon internal----------------------GI Dr Mechele Collin  adenoma hemorrhoids scoliosis chronic diarrhea/urgent stools (? IBS) osteopenia   (12/28/2009 12:38) Added new observation of COLONOSCOPY: Adenomatous Polyp (12/25/2009 12:38)       Past Medical History:    GERD, reflux esophagitis/ duodenitis    Chronic leg pain/ troch bursitis    Osteoarthritis -knees    TMJ    Seb dermatitis    Hyperlipidemia    Colonic polyps, hx of    Diverticulosis, colon internal----------------------GI Dr Mechele Collin     adenoma hemorrhoids    scoliosis    chronic diarrhea/urgent stools (? IBS)    osteopenia     Preventive Care Screening  Colonoscopy:    Date:  12/25/2009    Results:  Adenomatous Polyp

## 2010-12-31 NOTE — Progress Notes (Signed)
Summary: please review test results  Phone Note Call from Patient Call back at Home Phone [phone]   Caller: Patient Call For: [name] MD Summary of Call: Pt had repeat mammogram views and and ultrasound yesterday and she is asking that you review report and advise her on what to do.  They suggested she have a biopsy, or repeat tests in 6 months. Initial call taken by: Lowella Petties CMA,  September 05, 2010 11:09 AM  Follow-up for Phone Call        the report recommends 6 month re- imaging and I am comfortable with that  of course - if she is very nerous about that and would rather have a surgery consult to review it with her - I am happy to do the referral so let me know  Follow-up by: [name] MD,  September 05, 2010 12:35 PM  Additional Follow-up for Phone Call Additional follow up Details #1::        Patient notified as instructed by telephone. Pt said she would have to think about whether to wait 6 months or get a surgical referral. Pt will call back when she decides what to do.Lewanda Rife LPN  September 05, 2010 3:40 PM

## 2010-12-31 NOTE — Progress Notes (Signed)
Summary: asking for x-ray results and pt update on condition  Phone Note Call from Patient Call back at Home Phone [phone]   Caller: Patient Call For: [name] MD Summary of Call: Pt is asking for results of CXR.  Please review report and advise. Initial call taken by: Lowella Petties CMA,  August 23, 2010 12:20 PM  Follow-up for Phone Call        x ray looks ok - no pneumonia or bronchitis seen which is re- assuring  how are symptoms now? Follow-up by: [name] MD,  August 23, 2010 1:35 PM  Additional Follow-up for Phone Call Additional follow up Details #1::        Patient notified as instructed by telephone. Pt said still coughing, no better. Cough is productive but pt swallows phlegm so she does not know the color. Pt still has congestion in chest and congestion in her head. pt is light headed on and off. Pt does not have a thermometer but she does feel warm to touch on and off. Pt said she feels bad. Pt is supposed to go to family gathering this [place] and pt wonders if she is contagious. Pt is taking OTC Zyrtec. [facility] [address] if [facility] is needed. Pt can be reached at [phone].Lewanda Rife LPN  August 23, 2010 3:03 PM     Additional Follow-up for Phone Call Additional follow up Details #2::    needs to be seen if still feeling that bad - even though cxr was re- assuring  she may still be contagious  [facility] no [facility]ts [facility] today Follow-up by: [name] MD,  August 23, 2010 3:27 PM  Additional Follow-up for Phone Call Additional follow up Details #3:: Details for Additional Follow-up Action Taken: Patient notified as instructed by telephone. Zella Ball is making an appt [facility].Lewanda Rife LPN  August 23, 2010 4:24 PM

## 2011-01-02 NOTE — Progress Notes (Signed)
Summary: called with name of medicine  Phone Note Call from Patient Call back at Home Phone [phone]   Caller: Patient Call For: [name] MD Summary of Call: Pt called and said that she was supposed to call you with the name of a medicine that she was taking, it's arthrotec- but she is no longer taking this because she is out of it.  [facility]ses [facility] [address]. Initial call taken by: Lowella Petties CMA, AAMA,  December 13, 2010 10:19 AM  Follow-up for Phone Call        ok- I will remove it from the list  Follow-up by: [name] MD,  December 13, 2010 3:34 PM

## 2011-01-02 NOTE — Assessment & Plan Note (Signed)
Summary: F/U AFTER LABS/DLO   Vital Signs:  Patient profile:   77 year old female Height:      61.25 inches Weight:      181.25 pounds BMI:     34.09 Temp:     99.2 degrees F oral Pulse rate:   96 / minute Pulse rhythm:   regular BP sitting:   128 / 80  (left arm) Cuff size:   large  Vitals Entered By: Lewanda Rife LPN (December 10, 2010 2:09 PM) CC: follow-up visit after labs and head congested with non productive cough at night.   History of Present Illness: here for f/u of low vit D (it went from 12 to 15) also cough and head congestion  also having lower back pain   not really sure if she took the vitamin D or not  got off schedule  got her new 12 week schedule   thinks she has a cold  lots of head congestion for 2 weeks zyrtec is helping  is running its course and getting better  dry cough - she denies productivity   now having some lower back pain -- without known injury -- less than 6 months  does not exercise reguarly  in conj with pain in R buttock area - worse when she gets up  this does not feel like her hip bursitis worse when getting up best when reclining in a chair  in bed at night -- low back hurts - helps to prop up feet     has chronic oa in the knees and a lot of foot surgery- keep her from exercise  not ready for knee replacement       Allergies: 1)  ! Codeine  Past History:  Past Medical History: Last updated: 03/26/2010 GERD, reflux esophagitis/ duodenitis Chronic leg pain/ troch bursitis Osteoarthritis -knees TMJ Seb dermatitis Hyperlipidemia Colonic polyps, hx of Diverticulosis, colon internal----------------------GI Dr Mechele Collin  adenoma hemorrhoids IBS wth pp diarrhea   scoliosis chronic diarrhea/urgent stools (? IBS) osteopenia   Past Surgical History: Last updated: 03/26/2010 Hysterectomy- total surgery- cerebral/ aneurysm hemprrhoid surgery Dexa Colonoscopy([facility]) colonoscopy (polyp int hemm, diverticulosis)  8/05 EGD- esophagitis foot surg bilat 2010 - hammer toes and "knot" dexa 9/10- osteopenia  1/11 colonosc - polyps - hyperplastic and adenomatous   Family History: Last updated: 08/27/2009 Mother- Colon CA, OP with hip fx Brother- CVA? HTN, heart problem Brother- ColonCA Maternal Aunt- Colon CA  Social History: Last updated: 07/24/2009 Retired Married Former Smoker Alcohol use-no Drug use-no Regular exercise-yes  Risk Factors: Exercise: yes (07/24/2009)  Risk Factors: Smoking Status: quit (07/24/2009)  Review of Systems General:  Complains of fatigue; denies chills and fever. Eyes:  Denies blurring and eye irritation. ENT:  Complains of nasal congestion, postnasal drainage, and sore throat; denies earache and sinus pressure. CV:  Denies chest pain or discomfort and palpitations. Resp:  Complains of cough; denies pleuritic, shortness of breath, sputum productive, and wheezing. GI:  Denies abdominal pain and change in bowel habits. GU:  Denies dysuria, hematuria, urinary frequency, and urinary hesitancy. MS:  Complains of low back pain and stiffness; denies cramps and muscle weakness. Derm:  Denies itching, lesion(s), poor wound healing, and rash. Neuro:  Denies numbness and tingling. Endo:  Denies excessive thirst and excessive urination. Heme:  Denies abnormal bruising and bleeding.  Physical Exam  General:  overweight but generally well appearing  Head:  normocephalic, atraumatic, and no abnormalities observed.  no sinus tenderness  Eyes:  vision grossly  intact, pupils equal, pupils round, pupils reactive to light, and no injection.   Ears:  R ear normal and L ear normal.   Nose:  nares are injected and congested bilaterally  Mouth:  pharynx pink and moist, no erythema, and no exudates.   Neck:  supple with full rom and no masses or thyromegally, no JVD or carotid bruit  Chest Wall:  No deformities, masses, or tenderness noted. Lungs:  Normal respiratory effort,  chest expands symmetrically. Lungs are clear to auscultation, no crackles or wheezes. Heart:  Normal rate and regular rhythm. S1 and S2 normal without gallop, murmur, click, rub or other extra sounds. Abdomen:  no suprapubic tenderness or fullness felt  Msk:  kyphosis noted poor rom knees slt L5- S1 tenderness  tender in R lat musculature and SI joint  neg slr  nl rot hips LS flex 90 deg and ext 10 deg pain on R lat flex also   Extremities:  No clubbing, cyanosis, edema, or deformity noted with normal full range of motion of all joints.   Neurologic:  strength normal in all extremities, sensation intact to pinprick, gait normal, and DTRs symmetrical and normal.   Skin:  Intact without suspicious lesions or rashes Cervical Nodes:  No lymphadenopathy noted Inguinal Nodes:  No significant adenopathy Psych:  normal affect, talkative and pleasant    Impression & Recommendations:  Problem # 1:  BACK PAIN, LUMBAR (ICD-724.2) Assessment New this is new and worse on R with some perispinal muscular tenderness and poss SI tenderness ? strain vs disc vs oa check x ray trial of flexeril at night  update after x ray Her updated medication list for this problem includes:    Tylenol Extra Strength 500 Mg Tabs (Acetaminophen) .Marland Kitchen... As needed pain    Arthrotec 75-200 Mg-mcg Tabs (Diclofenac-misoprostol) .Marland Kitchen... Take one tablet two times a day with food as needed pain    Flexeril 10 Mg Tabs (Cyclobenzaprine hcl) .Marland Kitchen... 1 by mouth at bedtime as needed back pain warn- may sedate  Orders: T-Lumbar Spine 2 Views (72100TC) Prescription Created Electronically [phone])  Problem # 2:  VIRAL URI (ICD-465.9) Assessment: New recommend sympt care- see pt instructions   pt advised to update me if symptoms worsen or do not improve  The following medications were removed from the medication list:    Claritin 10 Mg Tabs (Loratadine) ..... Otc as directed.    Benzonatate 200 Mg Caps (Benzonatate) ..... One by  mouth three times a day as needed cough Her updated medication list for this problem includes:    Tylenol Extra Strength 500 Mg Tabs (Acetaminophen) .Marland Kitchen... As needed pain    Arthrotec 75-200 Mg-mcg Tabs (Diclofenac-misoprostol) .Marland Kitchen... Take one tablet two times a day with food as needed pain    Zyrtec Allergy 10 Mg Tabs (Cetirizine hcl) ..... Otc as directed.  Problem # 3:  UNSPECIFIED VITAMIN D DEFICIENCY (ICD-268.9) Assessment: Unchanged pt generally confused about her medicines - did long review  is now taking 12 wk of high dose vit d and will schedule labs in 12 weeks  disc imp of this to bone and overall health  Complete Medication List: 1)  Tylenol Extra Strength 500 Mg Tabs (Acetaminophen) .... As needed pain 2)  Calcium 1000 Mg and Vit D 400 Iu  .Marland Kitchen.. 1 by mouth two times a day 3)  Vitamin D3 400 Iu  .Marland Kitchen.. 1 by mouth once daily 4)  Fosamax 70 Mg Tabs (Alendronate sodium) .Marland Kitchen.. 1 by mouth once per  week as directed 5)  Arthrotec 75-200 Mg-mcg Tabs (Diclofenac-misoprostol) .... Take one tablet two times a day with food as needed pain 6)  Pepcid Ac 10 Mg Tabs (Famotidine) .Marland Kitchen.. 1 by mouth once daily as needed 7)  Ergocalciferol 50000 Unit Caps (Ergocalciferol) .Marland Kitchen.. 1 by mouth once weekly for 12 weeks 8)  Zyrtec Allergy 10 Mg Tabs (Cetirizine hcl) .... Otc as directed. 9)  Flexeril 10 Mg Tabs (Cyclobenzaprine hcl) .Marland Kitchen.. 1 by mouth at bedtime as needed back pain warn- may sedate  Patient Instructions: 1)  you can try mucinex over the counter twice daily as directed and nasal saline spray for congestion 2)  tylenol over the counter as directed may help with aches, headache and fever 3)  call if symptoms worsen or if not improved in 4-5 days  4)  try the flexeril at night for back pain - is a muscle relaxer -- this can make you sleepy- so use caution 5)  will do back x ray today - and then update you with a plan  6)  take your vitamin D as directed 7)  schedule lab in 12 weeks for vit D level  (she would like Tasha to draw her blood if possible )  8)  let me know if you are on arthrotek or not  Prescriptions: FLEXERIL 10 MG TABS (CYCLOBENZAPRINE HCL) 1 by mouth at bedtime as needed back pain warn- may sedate  #30 x 0   Entered and Authorized by:   [name] MD   Signed by:   [name] MD on 12/10/2010   Method used:   Electronically to        [facility] [address]       [address], [address]       [place], [place]  [address]       Ph: [phone]       Fax: [phone]   RxID:   [id number]    Orders Added: 1)  T-Lumbar Spine 2 Views [72100TC] 2)  Prescription Created Electronically [G8553] 3)  Est. Patient Level IV [[address]]    Current Allergies (reviewed today): ! CODEINE

## 2011-01-13 ENCOUNTER — Telehealth: Payer: Self-pay | Admitting: Family Medicine

## 2011-01-22 NOTE — Progress Notes (Signed)
Summary: refill request for flexeril  Phone Note Refill Request Call back at Home Phone [phone] Message from:  Patient  Refills Requested: Medication #1:  FLEXERIL 10 MG TABS 1 by mouth at bedtime as needed back pain warn- may sedate. Phoned request from pt, [facility] [address], pt says she has been out of this for 3 days.  Initial call taken by: Lowella Petties CMA, AAMA,  January 13, 2011 9:44 AM  Follow-up for Phone Call        px written on EMR for call in  Follow-up by: [name] MD,  January 13, 2011 11:59 AM  Additional Follow-up for Phone Call Additional follow up Details #1::        Medication phoned to [facility] [address] pharmacy as instructed. Patient notified as instructed by telephone. Lewanda Rife LPN  January 13, 2011 1:01 PM     Prescriptions: FLEXERIL 10 MG TABS (CYCLOBENZAPRINE HCL) 1 by mouth at bedtime as needed back pain warn- may sedate  #30 x 1   Entered and Authorized by:   [name] MD   Signed by:   [name] MD on 01/13/2011   Method used:   Telephoned to ...       [facility] [address]       [address]       [place], [place]  [address]       Ph: [phone]       Fax: [phone]   RxID:   [id number]

## 2011-03-03 ENCOUNTER — Other Ambulatory Visit: Payer: Self-pay | Admitting: Family Medicine

## 2011-03-03 DIAGNOSIS — E559 Vitamin D deficiency, unspecified: Secondary | ICD-10-CM

## 2011-03-04 ENCOUNTER — Other Ambulatory Visit (INDEPENDENT_AMBULATORY_CARE_PROVIDER_SITE_OTHER): Payer: Medicare Other | Admitting: Family Medicine

## 2011-03-04 DIAGNOSIS — E559 Vitamin D deficiency, unspecified: Secondary | ICD-10-CM

## 2011-03-05 LAB — VITAMIN D 25 HYDROXY (VIT D DEFICIENCY, FRACTURES): Vit D, 25-Hydroxy: 45 ng/mL (ref 30–89)

## 2011-03-06 ENCOUNTER — Telehealth: Payer: Self-pay

## 2011-03-06 NOTE — Telephone Encounter (Signed)
Finish the 50,000 unit pills weekly until gone Then start the 2000 iu daily F/u with me in about 3 months

## 2011-03-06 NOTE — Telephone Encounter (Signed)
I advise she go for the additional views She can also speak to Dr Lemar Livings about it if she wants to Vit D level is much better  Now get 2000 iu vit D3 otc  I do not like the shake diet programs - do not recommend them

## 2011-03-06 NOTE — Telephone Encounter (Signed)
Patient notified as instructed by telephone. Pt said she is still taking Vit  d3 400iu daily and still has 8 pills of the Vitamin D [address] iu that she takes weekly. Pt said she forgot to take the weekly pill sometimes. Should pt continue taking the Vit D 50000iu  Until finished or should she stop the 50000 iu pill now. Pt said she will stop the Vit D3 400iu pill and start the Vit D 2000iu daily. Pt also wants to know when she needs to be seen again. Please advise.

## 2011-03-06 NOTE — Telephone Encounter (Signed)
Patient notified as instructed by telephone. Pt will call back for appt. 

## 2011-03-06 NOTE — Telephone Encounter (Signed)
Pt is scheduled for add'l views Mon 03/10/11 at [facility]. Pt said this will be the 3rd time she has gone for more views and pt does not want to go. Pt wants Dr Royden Purl opinion  If she should go for add'l views of mammogram. I explalined Dr Milinda Antis is out of the office  Until Monday and pt said she needs an answer now.  Pt went for surgical consult to Dr Lemar Livings recently so I advised pt if she needed immediate answer to call Dr Rutherford Nail office at [phone]. Pt also wants to know recent lab results done here at [facility] and she wants to know if she needs f/u appt with Dr Milinda Antis or not. Pt also wants Dr Royden Purl opinion about the Lean protein Shake diet (it is a meal supplement drink). Pt said her neighbor gave her some packets and when pt drank the shake her diarrhea worsened. I told pt not to drink anymore of the shakes until advised by Dr Milinda Antis. Pt understands it will be next week before she gets call back about labs and the shake diet. Pt can be reached at [phone].Please advise.

## 2011-03-10 LAB — HM MAMMOGRAPHY

## 2011-03-13 ENCOUNTER — Telehealth: Payer: Self-pay

## 2011-03-13 NOTE — Telephone Encounter (Signed)
Notified pt the mammogram done on 03/10/11 was OK. There were benign appearing calcifications seen and radiologist wants 6 month bilateral diagnostic mammogram. Pt said technician told her that the 6 month mammogram would be in Oct but pt left before getting an exact date. I offered to call and schedule for pt but she said she would call in AM and make appt and call me back with appt date.

## 2011-03-14 ENCOUNTER — Telehealth: Payer: Self-pay

## 2011-03-14 ENCOUNTER — Encounter: Payer: Self-pay | Admitting: Family Medicine

## 2011-03-14 NOTE — Telephone Encounter (Signed)
Head congested, when blows nose mucus is clear, feels warm (pt does not have thermometer), irritated throat, sinus drainage,occasional productive cough with clear mucus. No earache. Pt has another appt today so cannot be seen. For 3 days pt has taken Allegra allergy OTC and no relief from symptoms. Pt plans to go out of town 03/18/11 and would like suggestion of another OTC med or prescription med to take. Pt [facility] [address] if pharmacy needed. Pt can be reached at [phone] and may leave message on answering machine.

## 2011-03-14 NOTE — Telephone Encounter (Signed)
Pt called back and 6 month bilateral diagnostic mammogram is scheduled for 09-09-11 at 2:30pm at [facility]. Report sent for scanning.

## 2011-03-14 NOTE — Telephone Encounter (Signed)
Recommend start daily nasal saline irrigation (OTC) as well as simple mucinex with plenty of fluid.  If not better, to update [place] next week.  If worsening over weekend, may need to seek care at [facility].

## 2011-03-14 NOTE — Telephone Encounter (Signed)
Pt notified mammogram is OK, benign appearing calcifications seen and 6 mth bilateral diagnostic mammogram scheduled 09/09/11 at 2:30pm at [facility]. Report sent for scanning.

## 2011-03-14 NOTE — Telephone Encounter (Signed)
Patient notified as instructed by telephone. 

## 2011-03-24 ENCOUNTER — Encounter: Payer: Self-pay | Admitting: Family Medicine

## 2011-04-29 ENCOUNTER — Other Ambulatory Visit: Payer: Self-pay | Admitting: *Deleted

## 2011-04-29 MED ORDER — CYCLOBENZAPRINE HCL 10 MG PO TABS: ORAL_TABLET | ORAL | Status: DC

## 2011-04-29 NOTE — Telephone Encounter (Signed)
Rx called in as directed.   

## 2011-04-29 NOTE — Telephone Encounter (Signed)
Px written for call in   

## 2011-07-29 ENCOUNTER — Encounter: Payer: Self-pay | Admitting: Family Medicine

## 2011-07-30 ENCOUNTER — Encounter: Payer: Self-pay | Admitting: Family Medicine

## 2011-07-30 ENCOUNTER — Ambulatory Visit (INDEPENDENT_AMBULATORY_CARE_PROVIDER_SITE_OTHER): Payer: Medicare Other | Admitting: Family Medicine

## 2011-07-30 DIAGNOSIS — M545 Low back pain, unspecified: Secondary | ICD-10-CM

## 2011-07-30 DIAGNOSIS — M6281 Muscle weakness (generalized): Secondary | ICD-10-CM

## 2011-07-30 DIAGNOSIS — R29898 Other symptoms and signs involving the musculoskeletal system: Secondary | ICD-10-CM | POA: Insufficient documentation

## 2011-07-30 NOTE — Patient Instructions (Signed)
Please schedule MRI of lumbar spine at check out (will have to see if it is ok to do with your clips in brain)  I will make a plan based on results  Work on weight loss for back pain

## 2011-07-30 NOTE — Progress Notes (Signed)
Subjective:    Patient ID: Julie [name], female    DOB: 1932/02/11, 77 y.o.   MRN: [id number]  HPI Pt is here for back pain  Has hx of degenerative disk dz and also scoliosis  Wt is up 1 lb   Back hurts in low back - worse when trying to get out of a seat Can hurt on either side  Also can get struck with muscle spasms at times that are brief  Is very restless at night -- but muscle relaxer and 2 tylenol seem to help   Takes tylenol during the day occasionally - makes her sleepy   Pain does shoot her R leg No numbness or weakness in that leg or foot   Rides stationary bike to exercise   Has scoliosis in her LS and TS  Also deg disc at T12 and also at L4-5 Also facet joint injection  She discussed this with her orthopedist -- Dr Ninfa Linden(? At [facility] ortho) at [place] [facility] He recommended MRI  And thought injection may help  She wanted to show him some of her x ray reports      Also has pain in L knee - getting synervisc type injections in that  ? If will need knee replacement in the future  Patient Active Problem List  Diagnoses  . UNSPECIFIED VITAMIN D DEFICIENCY  . HYPERLIPIDEMIA  . GERD  . DYSPEPSIA  . DIVERTICULOSIS, COLON  . IRRITABLE BOWEL SYNDROME  . BREAST MASS, RIGHT  . OSTEOARTHRITIS  . OSTEOPENIA  . DIARRHEA, PERSISTENT  . COLONIC POLYPS, HX OF  . POSTMENOPAUSAL STATUS  . BACK PAIN, LUMBAR  . Weakness of left leg   Past Medical History  Diagnosis Date  . GERD (gastroesophageal reflux disease)   . Esophagitis   . Duodenitis   . Chronic leg pain   . Trochanteric bursitis   . OA (osteoarthritis) of knee   . TMJ syndrome   . Seborrheic dermatitis   . HLD (hyperlipidemia)   . Colon polyp   . Diverticulosis of colon     internal----Dr. Mechele Collin  . Hemorrhoids   . Adenoma   . IBS (irritable bowel syndrome)     with PP diarrhea  . Scoliosis   . Osteopenia    Past Surgical History  Procedure Date  . Total abdominal hysterectomy   .  Cerebral aneurysm repair   . Hemorrhoid surgery   . Foot surgery 2010    bilateral hammer toes and "knot"  . Colonoscopy 1/11    polyps-hyperplastic and adenomatous   History  Substance Use Topics  . Smoking status: Former Games developer  . Smokeless tobacco: Not on file  . Alcohol Use: No   Family History  Problem Relation Age of Onset  . Colon cancer Mother   . Osteoporosis      hip fracture  . Stroke Brother   . Hypertension Brother   . Other Brother     Heart problem  . Colon cancer Brother   . Colon cancer Maternal Aunt    Allergies  Allergen Reactions  . Codeine     REACTION: headache and nausea and vomiting   Current Outpatient Prescriptions on File Prior to Visit  Medication Sig Dispense Refill  . acetaminophen (TYLENOL) 500 MG tablet Take 500 mg by mouth every 6 (six) hours as needed.        . Calcium Carb-Cholecalciferol (CALCIUM 1000 + D PO) Take 1 tablet by mouth daily.        Marland Kitchen  Cholecalciferol (VITAMIN D3) 400 UNITS CAPS Take 1 capsule by mouth daily.        . cyclobenzaprine (FLEXERIL) 10 MG tablet Take one tablet by mouth at bedtime as needed for back pain (may cause drowsiness)  30 tablet  1  . famotidine (PEPCID AC) 10 MG chewable tablet Chew 10 mg by mouth daily as needed.        Marland Kitchen alendronate (FOSAMAX) 70 MG tablet Take 70 mg by mouth every 7 (seven) days. Take with a full glass of water on an empty stomach.       . cetirizine (ZYRTEC) 10 MG tablet Take 10 mg by mouth daily.        . Vitamin D, Ergocalciferol, (DRISDOL) 50000 UNITS CAPS Take 50,000 Units by mouth every 7 (seven) days.               Review of Systems Review of Systems  Constitutional: Negative for fever, appetite change, fatigue and unexpected weight change.  Eyes: Negative for pain and visual disturbance.  Respiratory: Negative for cough and shortness of breath.   Cardiovascular: Negative.  for cp or palpitations  Gastrointestinal: Negative for nausea, diarrhea and constipation.    Genitourinary: Negative for urgency and frequency.  Skin: Negative for pallor. or rash MSK pos for back pain  Neurological: Negative for weakness, light-headedness, numbness and headaches.  Hematological: Negative for adenopathy. Does not bruise/bleed easily.  Psychiatric/Behavioral: Negative for dysphoric mood. The patient is not nervous/anxious.         Objective:   Physical Exam  Constitutional: She appears well-developed and well-nourished.  HENT:  Head: Normocephalic and atraumatic.  Eyes: Conjunctivae and EOM are normal. Pupils are equal, round, and reactive to light.  Neck: Normal range of motion. Neck supple. No JVD present. No thyromegaly present.  Cardiovascular: Normal rate, regular rhythm and normal heart sounds.   Pulmonary/Chest: Effort normal and breath sounds normal. No respiratory distress. She has no wheezes.  Abdominal: Soft. Bowel sounds are normal.  Musculoskeletal: She exhibits tenderness. She exhibits no edema.       thoracoscoliosis noted Tenderness over L4-L5 spinous processes  No thoracic tenderness R iliac/piriformis tenderness Pos R SLR for leg pain  L leg - unable to raise due to weakness of quadricep  Lymphadenopathy:    She has no cervical adenopathy.  Neurological: She is alert. She has normal reflexes. She displays no atrophy and no tremor. No cranial nerve deficit or sensory deficit. She exhibits normal muscle tone. Coordination normal.       Weakness of L quadricep noted  Favors R leg with gait   Skin: Skin is warm and dry. No rash noted. No erythema. No pallor.  Psychiatric: She has a normal mood and affect.          Assessment & Plan:

## 2011-07-30 NOTE — Assessment & Plan Note (Signed)
With multiple etiologies incl thoracolumbar scoliosis and deg disk dz (lumbar and low thoracic ) as well as facet joint arthritis  Pain is worsening and pt is becoming more disabled - she is ready to take next step in dx and tx  On exam today - has significant weakness in L leg and pos SLR on R  I feel this warrants MRI of spine if able to do (pt has clips from prior aneurysm in brain)  Will see if this is doable - then decide tx based on result

## 2011-08-03 ENCOUNTER — Ambulatory Visit
Admission: RE | Admit: 2011-08-03 | Discharge: 2011-08-03 | Disposition: A | Discharge status: Home / Self Care | Payer: Medicare Other | Source: Ambulatory Visit | Attending: Family Medicine | Admitting: Family Medicine

## 2011-08-03 DIAGNOSIS — M545 Low back pain, unspecified: Secondary | ICD-10-CM

## 2011-08-03 DIAGNOSIS — R29898 Other symptoms and signs involving the musculoskeletal system: Secondary | ICD-10-CM

## 2011-08-07 ENCOUNTER — Telehealth: Payer: Self-pay | Admitting: Family Medicine

## 2011-08-07 DIAGNOSIS — M546 Pain in thoracic spine: Secondary | ICD-10-CM | POA: Insufficient documentation

## 2011-08-07 NOTE — Telephone Encounter (Signed)
Message copied by Judy Pimple on Thu Aug 07, 2011  2:16 PM ------      Message from: Patience Musca      Created: Wed Aug 06, 2011  6:09 PM      Regarding: orthopedic referral       Please see result note.      ----- Message -----         From: Roxy Manns, MD         Sent: 08/05/2011   9:21 PM           To: Yetta Glassman, LPN            MRI shows some arthritis (facet joint), and scoliosis (as expected) as well as some disks bulging       Next I would like to refer her to orthopedics

## 2011-08-07 NOTE — Telephone Encounter (Signed)
I do not think she will likely need surgery- more likely possible injections Mostly I just want her evaluated Will ref to ortho

## 2011-08-07 NOTE — Telephone Encounter (Signed)
Patient notified as instructed by telephone. 

## 2011-09-10 ENCOUNTER — Encounter: Payer: Self-pay | Admitting: Family Medicine

## 2011-09-15 ENCOUNTER — Encounter: Payer: Self-pay | Admitting: *Deleted

## 2011-10-22 ENCOUNTER — Ambulatory Visit: Payer: Self-pay | Admitting: Orthopedic Surgery

## 2011-11-06 ENCOUNTER — Telehealth: Payer: Self-pay | Admitting: Internal Medicine

## 2011-11-06 NOTE — Telephone Encounter (Signed)
Patient called and stated she didn't know if she had food poison or stomach bug but she just when on a trip and they ate at the [employer] and she has been sick emesis and diarrhea she states that she thinks she is dehydrated and she has a headache and wanted to know what she could take until she could get in tomorrow for her appt.  I advised her to Tylenol and drink lots of liquids until tomorrow.

## 2011-11-06 NOTE — Telephone Encounter (Signed)
If she has dry mouth/ rapid heartbeat/ headache / dizziness (signs of dehydration ) and cannot keep anything down , I want her to go to UC or ER where she can get IV fluids  Otherwise take steady small sips of fluids until she can f/u

## 2011-11-07 NOTE — Telephone Encounter (Signed)
Patient notified as instructed by telephone. Pt said no symptoms other than h/a. If pt worsens she will go to ER and if still symptoms next week pt will call back.

## 2012-07-10 ENCOUNTER — Emergency Department: Payer: Self-pay | Admitting: Emergency Medicine

## 2012-07-12 ENCOUNTER — Emergency Department: Payer: Self-pay | Admitting: Emergency Medicine

## 2013-01-03 ENCOUNTER — Other Ambulatory Visit: Payer: Self-pay | Admitting: Orthopedic Surgery

## 2013-01-11 ENCOUNTER — Encounter (HOSPITAL_BASED_OUTPATIENT_CLINIC_OR_DEPARTMENT_OTHER): Payer: Self-pay | Admitting: *Deleted

## 2013-01-11 NOTE — Progress Notes (Signed)
Pt has no heart or resp problems

## 2013-01-18 ENCOUNTER — Encounter (HOSPITAL_BASED_OUTPATIENT_CLINIC_OR_DEPARTMENT_OTHER)
Admission: RE | Disposition: A | Discharge status: Home / Self Care | Payer: Self-pay | Source: Ambulatory Visit | Attending: Orthopedic Surgery

## 2013-01-18 ENCOUNTER — Ambulatory Visit (HOSPITAL_BASED_OUTPATIENT_CLINIC_OR_DEPARTMENT_OTHER)
Admission: RE | Admit: 2013-01-18 | Discharge: 2013-01-18 | Disposition: A | Discharge status: Home / Self Care | Payer: Medicare Other | Source: Ambulatory Visit | Attending: Orthopedic Surgery | Admitting: Orthopedic Surgery

## 2013-01-18 ENCOUNTER — Encounter (HOSPITAL_BASED_OUTPATIENT_CLINIC_OR_DEPARTMENT_OTHER): Payer: Self-pay | Admitting: Certified Registered Nurse Anesthetist

## 2013-01-18 ENCOUNTER — Ambulatory Visit (HOSPITAL_BASED_OUTPATIENT_CLINIC_OR_DEPARTMENT_OTHER): Payer: Medicare Other | Admitting: Certified Registered Nurse Anesthetist

## 2013-01-18 ENCOUNTER — Encounter (HOSPITAL_BASED_OUTPATIENT_CLINIC_OR_DEPARTMENT_OTHER): Payer: Self-pay | Admitting: *Deleted

## 2013-01-18 DIAGNOSIS — G5600 Carpal tunnel syndrome, unspecified upper limb: Secondary | ICD-10-CM | POA: Insufficient documentation

## 2013-01-18 DIAGNOSIS — IMO0002 Reserved for concepts with insufficient information to code with codable children: Secondary | ICD-10-CM | POA: Insufficient documentation

## 2013-01-18 HISTORY — PX: CARPAL TUNNEL RELEASE: SHX101

## 2013-01-18 HISTORY — PX: WRIST OSTEOTOMY: SHX1093

## 2013-01-18 HISTORY — DX: Other cervical disc degeneration, unspecified cervical region: M50.30

## 2013-01-18 LAB — POCT HEMOGLOBIN-HEMACUE: Hemoglobin: 13.8 g/dL (ref 12.0–15.0)

## 2013-01-18 SURGERY — CARPAL TUNNEL RELEASE
Anesthesia: Regional | Site: Wrist | Laterality: Left | Wound class: Clean

## 2013-01-18 MED ORDER — HYDROMORPHONE HCL PF 1 MG/ML IJ SOLN: 0.2500 mg | INTRAMUSCULAR | Status: DC | PRN

## 2013-01-18 MED ORDER — SUCCINYLCHOLINE CHLORIDE 20 MG/ML IJ SOLN
INTRAMUSCULAR | Status: DC | PRN
  Administered 2013-01-18: 100 mg via INTRAVENOUS

## 2013-01-18 MED ORDER — CHLORHEXIDINE GLUCONATE 4 % EX LIQD: 60.0000 mL | Freq: Once | CUTANEOUS | Status: DC

## 2013-01-18 MED ORDER — CEFAZOLIN SODIUM-DEXTROSE 2-3 GM-% IV SOLR
2.0000 g | INTRAVENOUS | Status: AC
  Administered 2013-01-18: 2 g via INTRAVENOUS

## 2013-01-18 MED ORDER — MIDAZOLAM HCL 2 MG/2ML IJ SOLN
1.0000 mg | INTRAMUSCULAR | Status: DC | PRN
  Administered 2013-01-18: 2 mg via INTRAVENOUS

## 2013-01-18 MED ORDER — FENTANYL CITRATE 0.05 MG/ML IJ SOLN
50.0000 ug | INTRAMUSCULAR | Status: DC | PRN
  Administered 2013-01-18 (×2): 50 ug via INTRAVENOUS

## 2013-01-18 MED ORDER — BUPIVACAINE-EPINEPHRINE PF 0.5-1:200000 % IJ SOLN
INTRAMUSCULAR | Status: DC | PRN
  Administered 2013-01-18: 25 mL

## 2013-01-18 MED ORDER — ONDANSETRON HCL 4 MG/2ML IJ SOLN: 4.0000 mg | Freq: Once | INTRAMUSCULAR | Status: DC | PRN

## 2013-01-18 MED ORDER — FENTANYL CITRATE 0.05 MG/ML IJ SOLN
INTRAMUSCULAR | Status: DC | PRN
  Administered 2013-01-18 (×2): 25 ug via INTRAVENOUS

## 2013-01-18 MED ORDER — PROPOFOL 10 MG/ML IV BOLUS
INTRAVENOUS | Status: DC | PRN
  Administered 2013-01-18: 50 mg via INTRAVENOUS
  Administered 2013-01-18: 30 mg via INTRAVENOUS
  Administered 2013-01-18: 200 mg via INTRAVENOUS

## 2013-01-18 MED ORDER — OXYCODONE HCL 5 MG PO TABS: 5.0000 mg | ORAL_TABLET | Freq: Once | ORAL | Status: DC | PRN

## 2013-01-18 MED ORDER — ONDANSETRON HCL 4 MG/2ML IJ SOLN
INTRAMUSCULAR | Status: DC | PRN
  Administered 2013-01-18: 4 mg via INTRAVENOUS

## 2013-01-18 MED ORDER — OXYCODONE HCL 5 MG/5ML PO SOLN: 5.0000 mg | Freq: Once | ORAL | Status: DC | PRN

## 2013-01-18 MED ORDER — LACTATED RINGERS IV SOLN
INTRAVENOUS | Status: DC
  Administered 2013-01-18 (×2): via INTRAVENOUS

## 2013-01-18 MED ORDER — DEXAMETHASONE SODIUM PHOSPHATE 10 MG/ML IJ SOLN
INTRAMUSCULAR | Status: DC | PRN
  Administered 2013-01-18: 5 mg via INTRAVENOUS
  Administered 2013-01-18: 10 mg

## 2013-01-18 MED ORDER — OXYCODONE-ACETAMINOPHEN 7.5-325 MG PO TABS: 1.0000 | ORAL_TABLET | ORAL | Status: DC | PRN

## 2013-01-18 MED ORDER — LIDOCAINE HCL (CARDIAC) 20 MG/ML IV SOLN
INTRAVENOUS | Status: DC | PRN
  Administered 2013-01-18: 80 mg via INTRAVENOUS

## 2013-01-18 MED ORDER — CEFAZOLIN SODIUM-DEXTROSE 2-3 GM-% IV SOLR: 2.0000 g | INTRAVENOUS | Status: DC

## 2013-01-18 SURGICAL SUPPLY — 87 items
BAG DECANTER FOR FLEXI CONT (MISCELLANEOUS) IMPLANT
BANDAGE GAUZE ELAST BULKY 4 IN (GAUZE/BANDAGES/DRESSINGS) ×2 IMPLANT
BIT DRILL 2 FAST STEP (BIT) ×1 IMPLANT
BIT DRILL 2.5X4 QC (BIT) ×1 IMPLANT
BLADE AVERAGE 25X9 (BLADE) IMPLANT
BLADE MINI RND TIP GREEN BEAV (BLADE) ×1 IMPLANT
BLADE SURG 15 STRL LF DISP TIS (BLADE) ×2 IMPLANT
BLADE SURG 15 STRL SS (BLADE) ×4
BNDG CMPR 9X4 STRL LF SNTH (GAUZE/BANDAGES/DRESSINGS) ×1
BNDG COHESIVE 3X5 TAN STRL LF (GAUZE/BANDAGES/DRESSINGS) ×2 IMPLANT
BNDG ESMARK 4X9 LF (GAUZE/BANDAGES/DRESSINGS) ×2 IMPLANT
BONE CHIP PRESERV 5CC PCAN5 (Bone Implant) ×2 IMPLANT
BUR EGG 3PK/BX (BURR) IMPLANT
CANISTER SUCTION 1200CC (MISCELLANEOUS) IMPLANT
CHLORAPREP W/TINT 26ML (MISCELLANEOUS) ×2 IMPLANT
CLOTH BEACON ORANGE TIMEOUT ST (SAFETY) ×2 IMPLANT
CORDS BIPOLAR (ELECTRODE) ×2 IMPLANT
COVER MAYO STAND STRL (DRAPES) ×2 IMPLANT
COVER TABLE BACK 60X90 (DRAPES) ×2 IMPLANT
CUFF TOURNIQUET SINGLE 18IN (TOURNIQUET CUFF) ×2 IMPLANT
DECANTER SPIKE VIAL GLASS SM (MISCELLANEOUS) IMPLANT
DRAIN TLS ROUND 10FR (DRAIN) IMPLANT
DRAPE EXTREMITY T 121X128X90 (DRAPE) ×2 IMPLANT
DRAPE INCISE IOBAN 66X45 STRL (DRAPES) IMPLANT
DRAPE OEC MINIVIEW 54X84 (DRAPES) ×2 IMPLANT
DRAPE PED LAPAROTOMY (DRAPES) IMPLANT
DRAPE SURG 17X23 STRL (DRAPES) ×2 IMPLANT
DRSG KUZMA FLUFF (GAUZE/BANDAGES/DRESSINGS) ×2 IMPLANT
DRSG PAD ABDOMINAL 8X10 ST (GAUZE/BANDAGES/DRESSINGS) IMPLANT
ELECT REM PT RETURN 9FT ADLT (ELECTROSURGICAL)
ELECTRODE REM PT RTRN 9FT ADLT (ELECTROSURGICAL) IMPLANT
GAUZE SPONGE 4X4 16PLY XRAY LF (GAUZE/BANDAGES/DRESSINGS) IMPLANT
GAUZE XEROFORM 1X8 LF (GAUZE/BANDAGES/DRESSINGS) ×3 IMPLANT
GLOVE BIO SURGEON STRL SZ 6.5 (GLOVE) ×3 IMPLANT
GLOVE BIOGEL M 8.0 STRL (GLOVE) ×1 IMPLANT
GLOVE BIOGEL PI IND STRL 8 (GLOVE) IMPLANT
GLOVE BIOGEL PI IND STRL 8.5 (GLOVE) ×1 IMPLANT
GLOVE BIOGEL PI INDICATOR 8 (GLOVE) ×1
GLOVE BIOGEL PI INDICATOR 8.5 (GLOVE) ×1
GLOVE INDICATOR 7.0 STRL GRN (GLOVE) ×1 IMPLANT
GLOVE SURG ORTHO 8.0 STRL STRW (GLOVE) ×2 IMPLANT
GOWN BRE IMP PREV XXLGXLNG (GOWN DISPOSABLE) ×3 IMPLANT
GOWN PREVENTION PLUS XLARGE (GOWN DISPOSABLE) ×2 IMPLANT
GRAFT BNE CANC CHIPS 1-8 5CC (Bone Implant) IMPLANT
K-WIRE .062X4 (WIRE) ×1 IMPLANT
LOOP VESSEL MAXI BLUE (MISCELLANEOUS) IMPLANT
NEEDLE 27GAX1X1/2 (NEEDLE) IMPLANT
NS IRRIG 1000ML POUR BTL (IV SOLUTION) ×2 IMPLANT
PACK BASIN DAY SURGERY FS (CUSTOM PROCEDURE TRAY) ×2 IMPLANT
PAD CAST 3X4 CTTN HI CHSV (CAST SUPPLIES) ×1 IMPLANT
PADDING CAST ABS 3INX4YD NS (CAST SUPPLIES) ×1
PADDING CAST ABS 4INX4YD NS (CAST SUPPLIES) ×1
PADDING CAST ABS COTTON 3X4 (CAST SUPPLIES) ×1 IMPLANT
PADDING CAST ABS COTTON 4X4 ST (CAST SUPPLIES) ×1 IMPLANT
PADDING CAST COTTON 3X4 STRL (CAST SUPPLIES) ×2
PENCIL BUTTON HOLSTER BLD 10FT (ELECTRODE) IMPLANT
PLATE SHORT 21.6X48.9 NRRW LT (Plate) ×1 IMPLANT
SCREW BN 12X3.5XNS CORT TI (Screw) IMPLANT
SCREW CORT 3.5X10 LNG (Screw) ×2 IMPLANT
SCREW CORT 3.5X12 (Screw) ×2 IMPLANT
SCREW PEG LOCK 2.5X16 (Peg) ×1 IMPLANT
SCREW PEG LOCK 2.5X18 (Peg) ×3 IMPLANT
SCREW PEG LOCK 2.5X20 (Peg) ×2 IMPLANT
SLEEVE SCD COMPRESS KNEE MED (MISCELLANEOUS) ×1 IMPLANT
SPLINT PLASTER CAST XFAST 3X15 (CAST SUPPLIES) IMPLANT
SPLINT PLASTER XTRA FASTSET 3X (CAST SUPPLIES)
SPONGE GAUZE 4X4 12PLY (GAUZE/BANDAGES/DRESSINGS) ×2 IMPLANT
SPONGE LAP 4X18 X RAY DECT (DISPOSABLE) IMPLANT
STOCKINETTE 4X48 STRL (DRAPES) ×2 IMPLANT
SUCTION FRAZIER TIP 10 FR DISP (SUCTIONS) IMPLANT
SUT CHROMIC 4 0 RB 1X27 (SUTURE) IMPLANT
SUT ETHIBOND 3-0 V-5 (SUTURE) IMPLANT
SUT MERSILENE 4 0 P 3 (SUTURE) IMPLANT
SUT NOVA NAB GS-22 2 0 T19 (SUTURE) IMPLANT
SUT VIC AB 0 CT1 27 (SUTURE)
SUT VIC AB 0 CT1 27XBRD ANBCTR (SUTURE) IMPLANT
SUT VIC AB 2-0 SH 27 (SUTURE)
SUT VIC AB 2-0 SH 27XBRD (SUTURE) IMPLANT
SUT VICRYL 4-0 PS2 18IN ABS (SUTURE) ×2 IMPLANT
SUT VICRYL RAPID 5 0 P 3 (SUTURE) IMPLANT
SUT VICRYL RAPIDE 4/0 PS 2 (SUTURE) ×2 IMPLANT
SYR BULB 3OZ (MISCELLANEOUS) ×2 IMPLANT
SYR CONTROL 10ML LL (SYRINGE) IMPLANT
TOWEL OR 17X24 6PK STRL BLUE (TOWEL DISPOSABLE) ×2 IMPLANT
TUBE CONNECTING 20X1/4 (TUBING) IMPLANT
UNDERPAD 30X30 INCONTINENT (UNDERPADS AND DIAPERS) ×2 IMPLANT
WATER STERILE IRR 1000ML POUR (IV SOLUTION) ×2 IMPLANT

## 2013-01-18 NOTE — H&P (Signed)
Julie [name] is a 79 year old right hand dominant female referred by Dr. Otelia Sergeant for a consultation with problems with her left hand. She suffered a fracture in July when she fell at [facility] in [place]. She was seen at Urgent Care and referred to the [facility] where she was placed in a splint. They saw her multiple times changing her cast. She has continued complaints of pain and discomfort in her hand. She centers these on the radial aspect. She was told she has arthritis. No history of diabetes, thyroid problems, or gout. She has had back problems for which she has been seeing Dr. Otelia Sergeant. She has been taking Hydrocodone. She states that nothing helps. She complains of constant, moderate burning type pain with a feeling of weakness. Activity makes it worse and rest makes it better. She has been wearing a brace. She has no prior history of injury.   PAST MEDICAL HISTORY: She is allergic to Codeine. She is on Cymbalta, Hydrocodone. She has had a hysterectomy, collectomy and aneurysm .  FAMILY H ISTORY: Negative.  SOCIAL HISTORY: She does not smoke or drink. She is married.  REVIEW OF SYSTEMS: Negative except for sleep disorder. Julie [name] is an 79 y.o. female.   Chief Complaint: Numbness , pain left wrist HPI: see above  Past Medical History  Diagnosis Date  . GERD (gastroesophageal reflux disease)   . Esophagitis   . Duodenitis   . Chronic leg pain   . Trochanteric bursitis   . OA (osteoarthritis) of knee   . TMJ syndrome   . Seborrheic dermatitis   . HLD (hyperlipidemia)   . Colon polyp   . Diverticulosis of colon     internal----Dr. Mechele Collin  . Hemorrhoids   . Adenoma   . IBS (irritable bowel syndrome)     with PP diarrhea  . Scoliosis   . Osteopenia   . DDD (degenerative disc disease), cervical     Past Surgical History  Procedure Laterality Date  . Total abdominal hysterectomy    . Hemorrhoid surgery    . Foot surgery  2010    bilateral hammer toes and  "knot"  . Colonoscopy  1/11    polyps-hyperplastic and adenomatous  . Cerebral aneurysm repair  2001    Family History  Problem Relation Age of Onset  . Colon cancer Mother   . Osteoporosis      hip fracture  . Stroke Brother   . Hypertension Brother   . Other Brother     Heart problem  . Colon cancer Brother   . Colon cancer Maternal Aunt    Social History:  reports that she has never smoked. She does not have any smokeless tobacco history on file. She reports that she does not drink alcohol or use illicit drugs.  Allergies:  Allergies  Allergen Reactions  . Codeine     REACTION: headache and nausea and vomiting    Medications Prior to Admission  Medication Sig Dispense Refill  . acetaminophen (TYLENOL) 500 MG tablet Take 500 mg by mouth every 6 (six) hours as needed.        . Calcium Carb-Cholecalciferol (CALCIUM 1000 + D PO) Take 1 tablet by mouth daily.        . Cholecalciferol (VITAMIN D3) 400 UNITS CAPS Take 1 capsule by mouth daily.        . famotidine (PEPCID AC) 10 MG chewable tablet Chew 10 mg by mouth daily as needed.        Marland Kitchen  HYDROcodone-acetaminophen (NORCO/VICODIN) 5-325 MG per tablet Take 1 tablet by mouth every 6 (six) hours as needed for pain.      . Vitamin D, Ergocalciferol, (DRISDOL) 50000 UNITS CAPS Take 50,000 Units by mouth every 7 (seven) days.        . cetirizine (ZYRTEC) 10 MG tablet Take 10 mg by mouth daily.        . cyclobenzaprine (FLEXERIL) 10 MG tablet Take one tablet by mouth at bedtime as needed for back pain (may cause drowsiness)  30 tablet  1    No results found for this or any previous visit (from the past 48 hour(s)).  No results found.   Pertinent items are noted in HPI.  Blood pressure 145/66, pulse 76, temperature 97.6 F (36.4 C), temperature source Oral, resp. rate 16, height 5\' 1"  (1.549 m), weight 77.565 kg (171 lb), SpO2 96.00%.  General appearance: alert, cooperative and appears stated age Head: Normocephalic, without  obvious abnormality Neck: no adenopathy and no JVD Resp: clear to auscultation bilaterally Cardio: regular rate and rhythm, S1, S2 normal, no murmur, click, rub or gallop GI: soft, non-tender; bowel sounds normal; no masses,  no organomegaly Extremities: extremities normal, atraumatic, no cyanosis or edema Pulses: 2+ and symmetric Skin: Skin color, texture, turgor normal. No rashes or lesions Neurologic: Grossly normal Incision/Wound: na  Assessment/Plan rays of her wrist reveals a distal radius malunion with a 60 degree angulation dorsally, a mid carpal instability present. She does have mild degenerative change at the [facility] joint of her thumb. She is complaining of catching of her left thumb, popping especially in the morning. She does have a nodule present. There is no discrete triggering at the present time. She shows hyperextension of the MCP joints. CMC grind and stress does produce pain for her. Finkelstein's is negative. These are all negative on the right not affected side.  Diagnosis: (1) Malunion distal radius with mid carpal instability. (2) CMC arthritis, STS and  carpal tunnel syndrome. (3) Questionable dystrophy. Plan CTR with corrective  Osteotomy radius  Julie [name] R 01/18/2013, 9:21 AM

## 2013-01-18 NOTE — Anesthesia Postprocedure Evaluation (Signed)
  Anesthesia Post-op Note  Patient: Julie [name]  Procedure(s) Performed: Procedure(s) with comments: CARPAL TUNNEL RELEASE (Left) - OSTEOTOMY LEFT DISTAL RADIUS BONE CHIPS CARPAL TUNNEL RELEASE LEFT  WRIST OSTEOTOMY (Left)  Patient Location: PACU  Anesthesia Type:GA combined with regional for post-op pain  Level of Consciousness: awake, alert  and oriented  Airway and Oxygen Therapy: Patient Spontanous Breathing  Post-op Pain: none  Post-op Assessment: Post-op Vital signs reviewed  Post-op Vital Signs: Reviewed  Complications: No apparent anesthesia complications

## 2013-01-18 NOTE — Anesthesia Procedure Notes (Addendum)
Procedure Name: Intubation Date/Time: 01/18/2013 9:52 AM Performed by: Burna Cash Pre-anesthesia Checklist: Patient identified, Emergency Drugs available, Suction available and Patient being monitored Patient Re-evaluated:Patient Re-evaluated prior to inductionOxygen Delivery Method: Circle System Utilized Preoxygenation: Pre-oxygenation with 100% oxygen Intubation Type: IV induction Ventilation: Mask ventilation without difficulty Grade View: Grade I Tube type: Oral Tube size: 7.0 mm Number of attempts: 1 Airway Equipment and Method: stylet and oral airway Placement Confirmation: ETT inserted through vocal cords under direct vision,  positive ETCO2 and breath sounds checked- equal and bilateral Secured at: 20 cm Tube secured with: Tape Dental Injury: Teeth and Oropharynx as per pre-operative assessment    Anesthesia Regional Block:  Supraclavicular block  Pre-Anesthetic Checklist: ,, timeout performed, Correct Patient, Correct Site, Correct Laterality, Correct Procedure, Correct Position, site marked, Risks and benefits discussed,  Surgical consent,  Pre-op evaluation,  At surgeon's request and post-op pain management  Laterality: Left and Upper  Prep: chloraprep       Needles:  Injection technique: Single-shot  Needle Type: Echogenic Needle     Needle Length: 5cm 5 cm Needle Gauge: 21 and 21 G    Additional Needles:  Procedures: ultrasound guided (picture in chart) Supraclavicular block Narrative:  Start time: 01/18/2013 9:24 AM End time: 01/18/2013 9:30 AM Injection made incrementally with aspirations every 5 mL.  Performed by: Personally  Anesthesiologist: Sheldon Silvan  Supraclavicular block

## 2013-01-18 NOTE — Op Note (Signed)
Julie [name], Julie [name]             ACCOUNT NO.:  [id number]  MEDICAL RECORD NO.:  [id number]  LOCATION:                                 FACILITY:  PHYSICIAN:  Cindee Salt, M.D.            DATE OF BIRTH:  DATE OF PROCEDURE:  01/18/2013 DATE OF DISCHARGE:                              OPERATIVE REPORT   PREOPERATIVE DIAGNOSIS:  Malunion left distal radius with carpal tunnel syndrome, left hand.  POSTOPERATIVE DIAGNOSIS:  Malunion left distal radius with carpal tunnel syndrome, left hand.  OPERATION:  Osteotomy with allograft, volar plating with [employer], DVR plate, left wrist with carpal tunnel release, left hand.  SURGEON:  Cindee Salt, MD  ASSISTANT:  Betha Loa, MD  ANESTHESIA:  Supraclavicular block general.  ANESTHESIOLOGIST:  Sheldon Silvan, MD  HISTORY:  The patient is a 79 year old female, who suffered a fall approximately 8 months ago, suffering a distal radius fracture which has gone on to a malunion with significant dorsal extension of her distal fragment with significant mid carpal instability and carpal tunnel syndrome.  EMG nerve conductions positive.  She has elected to undergo carpal tunnel release along with osteotomy of the distal radius in effort to realign this.  Pre, peri, and postoperative course have been discussed along with risks and complications.  She is aware that there is no guarantee with the surgery; possibility of infection; recurrence of injury to arteries, nerves, tendons, incomplete relief of symptoms, dystrophy.  In the preoperative area, the patient was seen, the extremity marked by both patient and surgeon.  Antibiotic given.  DESCRIPTION OF PROCEDURE:  The patient was brought to the operating room where a supraclavicular block general anesthetic was carried out without difficulty.  She was prepped using ChloraPrep, supine position with the left arm free.  A 3-minute dry time was allowed.  Time-out taken, confirming patient and  procedure.  The limb was exsanguinated with an Esmarch bandage.  Tourniquet placed high and the arm was inflated to 275 mmHg.  A radial incision was made, carried to the radial aspect of the radial styloid, then ulnarly.  This was carried down through subcutaneous tissue.  Bleeders were electrocauterized with bipolar. Dissection carried through the flexor carpi radialis tendon sheath. Radial artery was identified along with the superficial branch.  The pronator quadratus was incised.  Brachioradialis was step cut to allow repair at the end of the procedure.  This was extremely tight, shortened.  The first dorsal compartment tendons were identified along with the wrist extensors.  This was then elevated subperiosteally.  The malunion was immediately apparent.  A left DVR plate was then selected. This was placed on the distal fragment after elevating the pronator quadratus to the ulnar side of her wrist.  The plate was fixated with two 0.62 K-wires.  X-rays taken confirming placement just proximal to the articular surface in good position.  The locking screws were then placed.  These measured between 18 and 22 mm and were inset with fully threaded locking screws.  This firmly fixed the distal fragment.  The proximal plate was well elevated off from the distal radius.  A 0.62 K- wire was then  placed for anticipated placement of the osteotomy.  The periosteum was protected and a oscillating saw was used to cut the bone after confirmation of this being approximately leveled with the old fracture.  The dorsal periosteum was then cut proximally, allowed to retract distally and the plate was affixed to the proximal radius, clamped in position, confirming position.  This derotated the distal fragment, placed it into the osteotomy site with the articular surface well aligned.  Screws were in the articular surface.  The radial tilt was reapproximated to approximately neutral to slight volar  flexion. The plate was then affixed proximally with two 10 and one 12 mm screw. This firmly fixed the construct in position.  This was copiously irrigated with saline.  A separate incision was then made for carpal tunnel release longitudinally in the palm, carried down through subcutaneous tissue.  Bleeders again electrocauterized.  Palmar fascia was split.  Superficial palmar arch identified.  Flexor tendon to the ring little finger identified to the ulnar side of median nerve.  Carpal retinaculum was incised with sharp dissection.  Right angle and Sewall retractor were placed between skin and forearm fascia.  The fascia was released for approximately a centimeter and half to 2 cm proximal to the wrist crease.  The canal was explored.  Air compression to the nerve was apparent.  Motor branch was noted to be intact.  This was irrigated and closed with interrupted 4-0 Vicryl Rapide sutures.  The bone graft was then placed into the distal radius osteotomy site.  This was chipped allograft, 5 mL used.  This was confirmed on x-ray.  This was done after drilling the proximal cortex of the distal fragment to allow revascularization in that this was in the shaft of the radius proximally.  X-rays confirmed good bone graft placement.  The brachioradialis was then repaired with 2-0 Vicryl sutures.  The pronator quadratus was able to be closed entirely over the plate with interrupted 2-0 Vicryl, the subcutaneous tissue closed with interrupted 2-0 Vicryl, and the skin with interrupted 4-0 Vicryl Rapide sutures.  Sterile compressive dressing, dorsal palmar splint applied.  On deflation of the tourniquet, all fingers immediately pinked.  She was taken to the recovery room for observation in satisfactory condition.  She will be discharged home to return to the [facility] [place] in 1 week on [place] [facility].          ______________________________ Cindee Salt, M.D.     GK/MEDQ  D:  01/18/2013   T:  01/18/2013  Job:  [id number]

## 2013-01-18 NOTE — Op Note (Signed)
  Dictation Number [id number]

## 2013-01-18 NOTE — Transfer of Care (Signed)
Immediate Anesthesia Transfer of Care Note  Patient: Julie [name]  Procedure(s) Performed: Procedure(s) with comments: CARPAL TUNNEL RELEASE (Left) - OSTEOTOMY LEFT DISTAL RADIUS BONE CHIPS CARPAL TUNNEL RELEASE LEFT  WRIST OSTEOTOMY (Left)  Patient Location: PACU  Anesthesia Type:GA combined with regional for post-op pain  Level of Consciousness: sedated  Airway & Oxygen Therapy: Patient Spontanous Breathing and Patient connected to face mask oxygen  Post-op Assessment: Report given to PACU RN and Post -op Vital signs reviewed and stable  Post vital signs: Reviewed and stable  Complications: No apparent anesthesia complications

## 2013-01-18 NOTE — Progress Notes (Signed)
  Assisted Dr. Crews with left, ultrasound guided, supraclavicular block. Side rails up, monitors on throughout procedure. See vital signs in flow sheet. Tolerated Procedure well. 

## 2013-01-18 NOTE — Addendum Note (Signed)
Addendum created 01/18/13 1332 by Kerby Nora, MD   Modules edited: Anesthesia Blocks and Procedures, Anesthesia Medication Administration

## 2013-01-18 NOTE — Brief Op Note (Signed)
01/18/2013  11:35 AM  PATIENT:  Wyn Quaker  79 y.o. female  PRE-OPERATIVE DIAGNOSIS:  malunion left distal radius carpal tunnel syndrome   POST-OPERATIVE DIAGNOSIS:  malunion left distal radius carpal tunnel syndrome  PROCEDURE:  Procedure(s) with comments: CARPAL TUNNEL RELEASE (Left) - OSTEOTOMY LEFT DISTAL RADIUS BONE CHIPS CARPAL TUNNEL RELEASE LEFT  WRIST OSTEOTOMY (Left)  SURGEON:  Surgeon(s) and Role:    * Nicki Reaper, MD - Primary    * Tami Ribas, MD - Assisting  PHYSICIAN ASSISTANT:   ASSISTANTS: K Jayani Rozman,MD   ANESTHESIA:   regional and general  EBL:  Total I/O In: 1300 [I.V.:1300] Out: -   BLOOD ADMINISTERED:none  DRAINS: none   LOCAL MEDICATIONS USED:  NONE  SPECIMEN:  No Specimen  DISPOSITION OF SPECIMEN:  N/A  COUNTS:  YES  TOURNIQUET:   Total Tourniquet Time Documented: Upper Arm (Left) - 86 minutes Total: Upper Arm (Left) - 86 minutes   DICTATION: .Other Dictation: Dictation Number [id number]  PLAN OF CARE: Discharge to home after PACU  PATIENT DISPOSITION:  PACU - hemodynamically stable.

## 2013-01-18 NOTE — Progress Notes (Signed)
Slight swelling noted to site where block performed (L neck)- Dr. Ivin Booty aware.

## 2013-01-18 NOTE — Anesthesia Preprocedure Evaluation (Signed)

## 2013-01-20 ENCOUNTER — Encounter (HOSPITAL_BASED_OUTPATIENT_CLINIC_OR_DEPARTMENT_OTHER): Payer: Self-pay | Admitting: Orthopedic Surgery

## 2013-03-14 ENCOUNTER — Ambulatory Visit (INDEPENDENT_AMBULATORY_CARE_PROVIDER_SITE_OTHER): Payer: Medicare Other | Admitting: Family Medicine

## 2013-03-14 ENCOUNTER — Encounter: Payer: Self-pay | Admitting: Family Medicine

## 2013-03-14 VITALS — BP 146/84 | HR 80 | Temp 98.8°F | Ht 61.25 in | Wt 168.2 lb

## 2013-03-14 DIAGNOSIS — N9089 Other specified noninflammatory disorders of vulva and perineum: Secondary | ICD-10-CM | POA: Insufficient documentation

## 2013-03-14 DIAGNOSIS — M949 Disorder of cartilage, unspecified: Secondary | ICD-10-CM

## 2013-03-14 DIAGNOSIS — M899 Disorder of bone, unspecified: Secondary | ICD-10-CM

## 2013-03-14 NOTE — Progress Notes (Signed)
Subjective:    Patient ID: Julie [name], female    DOB: 1932-12-01, 79 y.o.   MRN: [id number]  HPI Here for a growth? On vulvar area  She noticed it recently while bathing - and it is tender to the past 4 mo , and thinks it may have grown  Cannot tell if it is bleeding or draining  No affect on urination  Never had anything like it before   Is tender to the touch but not painful in between   Washes area with soap ([employer])   Is r side and superior   No vaginal discharge or itching    Had a bad year- bad back problems and also fractured her arm (surgery)   Patient Active Problem List  Diagnosis  . UNSPECIFIED VITAMIN D DEFICIENCY  . HYPERLIPIDEMIA  . GERD  . DYSPEPSIA  . DIVERTICULOSIS, COLON  . IRRITABLE BOWEL SYNDROME  . BREAST MASS, RIGHT  . OSTEOARTHRITIS  . OSTEOPENIA  . DIARRHEA, PERSISTENT  . COLONIC POLYPS, HX OF  . POSTMENOPAUSAL STATUS  . BACK PAIN, LUMBAR  . Weakness of left leg  . Back pain, thoracic   Past Medical History  Diagnosis Date  . GERD (gastroesophageal reflux disease)   . Esophagitis   . Duodenitis   . Chronic leg pain   . Trochanteric bursitis   . OA (osteoarthritis) of knee   . TMJ syndrome   . Seborrheic dermatitis   . HLD (hyperlipidemia)   . Colon polyp   . Diverticulosis of colon     internal----Dr. Mechele Collin  . Hemorrhoids   . Adenoma   . IBS (irritable bowel syndrome)     with PP diarrhea  . Scoliosis   . Osteopenia   . DDD (degenerative disc disease), cervical    Past Surgical History  Procedure Laterality Date  . Total abdominal hysterectomy    . Hemorrhoid surgery    . Foot surgery  2010    bilateral hammer toes and "knot"  . Colonoscopy  1/11    polyps-hyperplastic and adenomatous  . Cerebral aneurysm repair  2001  . Carpal tunnel release Left 01/18/2013    Procedure: CARPAL TUNNEL RELEASE;  Surgeon: Nicki Reaper, MD;  Location: [facility];  Service: Orthopedics;  Laterality: Left;   OSTEOTOMY LEFT DISTAL RADIUS BONE CHIPS CARPAL TUNNEL RELEASE LEFT   . Wrist osteotomy Left 01/18/2013    Procedure: WRIST OSTEOTOMY;  Surgeon: Nicki Reaper, MD;  Location: [facility];  Service: Orthopedics;  Laterality: Left;   History  Substance Use Topics  . Smoking status: Never Smoker   . Smokeless tobacco: Not on file  . Alcohol Use: No   Family History  Problem Relation Age of Onset  . Colon cancer Mother   . Osteoporosis      hip fracture  . Stroke Brother   . Hypertension Brother   . Other Brother     Heart problem  . Colon cancer Brother   . Colon cancer Maternal Aunt    Allergies  Allergen Reactions  . Codeine     REACTION: headache and nausea and vomiting   Current Outpatient Prescriptions on File Prior to Visit  Medication Sig Dispense Refill  . acetaminophen (TYLENOL) 500 MG tablet Take 500 mg by mouth every 6 (six) hours as needed.        . Calcium Carb-Cholecalciferol (CALCIUM 1000 + D PO) Take 1 tablet by mouth daily.        Marland Kitchen  Cholecalciferol (VITAMIN D3) 400 UNITS CAPS Take 1 capsule by mouth daily.        . famotidine (PEPCID AC) 10 MG chewable tablet Chew 10 mg by mouth daily as needed.        Marland Kitchen oxyCODONE-acetaminophen (PERCOCET) 7.5-325 MG per tablet Take 1 tablet by mouth every 4 (four) hours as needed for pain.  30 tablet  0  . Vitamin D, Ergocalciferol, (DRISDOL) 50000 UNITS CAPS Take 50,000 Units by mouth every 7 (seven) days.         No current facility-administered medications on file prior to visit.    Review of Systems    Review of Systems  Constitutional: Negative for fever, appetite change, fatigue and unexpected weight change.  Eyes: Negative for pain and visual disturbance.  Respiratory: Negative for cough and shortness of breath.   Cardiovascular: Negative for cp or palpitations    Gastrointestinal: Negative for nausea, diarrhea and constipation.  Genitourinary: Negative for urgency and frequency.  Skin: Negative for  pallor or rash   MSK pos for chronic back pain / and also arm pain healing from fracture  Neurological: Negative for weakness, light-headedness, numbness and headaches.  Hematological: Negative for adenopathy. Does not bruise/bleed easily.  Psychiatric/Behavioral: Negative for dysphoric mood. The patient is not nervous/anxious.      Objective:   Physical Exam  Constitutional: She appears well-developed and well-nourished. No distress.  HENT:  Head: Normocephalic and atraumatic.  Eyes: Conjunctivae and EOM are normal. Pupils are equal, round, and reactive to light.  Neck: Normal range of motion. Neck supple.  Cardiovascular: Normal rate and regular rhythm.   Pulmonary/Chest: Effort normal and breath sounds normal.  Genitourinary: No labial fusion. There is lesion on the right labia. There is no rash, tenderness or injury on the right labia. There is no rash, tenderness, lesion or injury on the left labia. No bleeding around the vagina. No signs of injury around the vagina. No vaginal discharge found.  R upper labia majory- 3-4 mm flesh colored skin tag  Non tender, no redness or signs of infection   Musculoskeletal:  L arm in brace   Neurological: She is alert.  Skin: Skin is warm and dry. No rash noted.  Psychiatric: She has a normal mood and affect.          Assessment & Plan:

## 2013-03-14 NOTE — Patient Instructions (Addendum)
The area I see on your vulva looks like a skin tag Unless it gets bigger or bothers you - I would not do anything except watch it  Please let me know if this worsens - and we would set you up with a gynecologist for that   In the future - if you need to use a walker to prevent falls- please do

## 2013-03-14 NOTE — Assessment & Plan Note (Signed)
This appears benign- R labia majora Adv to keep an eye on it - if it grows or becomes bothersome- will refer to gyn to remove it

## 2013-07-04 ENCOUNTER — Telehealth: Payer: Self-pay

## 2013-07-04 NOTE — Telephone Encounter (Signed)
Pt has lab appt on 07/06/13 and pt wants to know if needs to fast; advised pt note said pt does need to fast;pt voiced understanding.

## 2013-07-05 ENCOUNTER — Telehealth: Payer: Self-pay | Admitting: Family Medicine

## 2013-07-05 DIAGNOSIS — M949 Disorder of cartilage, unspecified: Secondary | ICD-10-CM

## 2013-07-05 DIAGNOSIS — M899 Disorder of bone, unspecified: Secondary | ICD-10-CM

## 2013-07-05 DIAGNOSIS — E785 Hyperlipidemia, unspecified: Secondary | ICD-10-CM

## 2013-07-05 DIAGNOSIS — K219 Gastro-esophageal reflux disease without esophagitis: Secondary | ICD-10-CM

## 2013-07-05 DIAGNOSIS — E559 Vitamin D deficiency, unspecified: Secondary | ICD-10-CM

## 2013-07-05 NOTE — Telephone Encounter (Signed)
Message copied by Judy Pimple on Tue Jul 05, 2013  9:46 PM ------      Message from: Julie [name]      Created: Tue Jun 21, 2013 11:57 AM      Regarding: f/u labs Wed 8/6       Please order  future cpx labs for pt's upcoming lab appt.      Thanks      Tasha       ------

## 2013-07-06 ENCOUNTER — Other Ambulatory Visit (INDEPENDENT_AMBULATORY_CARE_PROVIDER_SITE_OTHER): Payer: Medicare Other

## 2013-07-06 DIAGNOSIS — E559 Vitamin D deficiency, unspecified: Secondary | ICD-10-CM

## 2013-07-06 DIAGNOSIS — M899 Disorder of bone, unspecified: Secondary | ICD-10-CM

## 2013-07-06 DIAGNOSIS — R197 Diarrhea, unspecified: Secondary | ICD-10-CM

## 2013-07-06 DIAGNOSIS — K5730 Diverticulosis of large intestine without perforation or abscess without bleeding: Secondary | ICD-10-CM

## 2013-07-06 DIAGNOSIS — K219 Gastro-esophageal reflux disease without esophagitis: Secondary | ICD-10-CM

## 2013-07-06 DIAGNOSIS — R29898 Other symptoms and signs involving the musculoskeletal system: Secondary | ICD-10-CM

## 2013-07-06 DIAGNOSIS — E785 Hyperlipidemia, unspecified: Secondary | ICD-10-CM

## 2013-07-06 LAB — CBC WITH DIFFERENTIAL/PLATELET
Basophils Absolute: 0 10*3/uL (ref 0.0–0.1)
Basophils Relative: 0.5 % (ref 0.0–3.0)
Eosinophils Absolute: 0.1 10*3/uL (ref 0.0–0.7)
Eosinophils Relative: 0.7 % (ref 0.0–5.0)
HCT: 39.3 % (ref 36.0–46.0)
Hemoglobin: 12.9 g/dL (ref 12.0–15.0)
Lymphocytes Relative: 19.1 % (ref 12.0–46.0)
Lymphs Abs: 1.6 10*3/uL (ref 0.7–4.0)
MCHC: 32.8 g/dL (ref 30.0–36.0)
MCV: 86.2 fl (ref 78.0–100.0)
Monocytes Absolute: 0.6 10*3/uL (ref 0.1–1.0)
Monocytes Relative: 7.1 % (ref 3.0–12.0)
Neutro Abs: 6.2 10*3/uL (ref 1.4–7.7)
Neutrophils Relative %: 72.6 % (ref 43.0–77.0)
Platelets: 295 10*3/uL (ref 150.0–400.0)
RBC: 4.56 Mil/uL (ref 3.87–5.11)
RDW: 14 % (ref 11.5–14.6)
WBC: 8.6 10*3/uL (ref 4.5–10.5)

## 2013-07-06 LAB — COMPREHENSIVE METABOLIC PANEL
ALT: 11 U/L (ref 0–35)
AST: 14 U/L (ref 0–37)
Albumin: 3.8 g/dL (ref 3.5–5.2)
Alkaline Phosphatase: 98 U/L (ref 39–117)
BUN: 14 mg/dL (ref 6–23)
CO2: 25 mEq/L (ref 19–32)
Calcium: 9 mg/dL (ref 8.4–10.5)
Chloride: 104 mEq/L (ref 96–112)
Creatinine, Ser: 0.9 mg/dL (ref 0.4–1.2)
GFR: 65.78 mL/min (ref >60.00)
Glucose, Bld: 79 mg/dL (ref 70–99)
Potassium: 3.8 mEq/L (ref 3.5–5.1)
Sodium: 137 mEq/L (ref 135–145)
Total Bilirubin: 0.6 mg/dL (ref 0.3–1.2)
Total Protein: 7.3 g/dL (ref 6.0–8.3)

## 2013-07-06 LAB — LIPID PANEL
Cholesterol: 211 mg/dL — ABNORMAL HIGH (ref 0–200)
HDL: 47.2 mg/dL (ref >39.00)
Total CHOL/HDL Ratio: 4
Triglycerides: 197 mg/dL — ABNORMAL HIGH (ref 0.0–149.0)
VLDL: 39.4 mg/dL (ref 0.0–40.0)

## 2013-07-06 LAB — LDL CHOLESTEROL, DIRECT: Direct LDL: 134.7 mg/dL

## 2013-07-06 LAB — TSH: TSH: 1.39 u[IU]/mL (ref 0.35–5.50)

## 2013-07-07 LAB — VITAMIN D 25 HYDROXY (VIT D DEFICIENCY, FRACTURES): Vit D, 25-Hydroxy: 17 ng/mL — ABNORMAL LOW (ref 30–89)

## 2013-07-13 ENCOUNTER — Ambulatory Visit (INDEPENDENT_AMBULATORY_CARE_PROVIDER_SITE_OTHER): Payer: Medicare Other | Admitting: Family Medicine

## 2013-07-13 ENCOUNTER — Encounter: Payer: Self-pay | Admitting: Family Medicine

## 2013-07-13 VITALS — BP 112/72 | HR 71 | Temp 98.7°F | Ht 61.75 in | Wt 171.8 lb

## 2013-07-13 DIAGNOSIS — Z0000 Encounter for general adult medical examination without abnormal findings: Secondary | ICD-10-CM

## 2013-07-13 DIAGNOSIS — E559 Vitamin D deficiency, unspecified: Secondary | ICD-10-CM

## 2013-07-13 DIAGNOSIS — E785 Hyperlipidemia, unspecified: Secondary | ICD-10-CM

## 2013-07-13 DIAGNOSIS — M899 Disorder of bone, unspecified: Secondary | ICD-10-CM

## 2013-07-13 DIAGNOSIS — M949 Disorder of cartilage, unspecified: Secondary | ICD-10-CM

## 2013-07-13 DIAGNOSIS — Z9181 History of falling: Secondary | ICD-10-CM | POA: Insufficient documentation

## 2013-07-13 DIAGNOSIS — Z23 Encounter for immunization: Secondary | ICD-10-CM

## 2013-07-13 MED ORDER — ERGOCALCIFEROL 1.25 MG (50000 UT) PO CAPS: 50000.0000 [IU] | ORAL_CAPSULE | ORAL | Status: DC

## 2013-07-13 NOTE — Patient Instructions (Addendum)
You have a high fall risk- I prefer that you use your walker at all times  In the future when you are ready- I would like to refer you to physical therapy to work on balance If you are interested in a shingles/zoster vaccine - call your insurance to check on coverage,( you should not get it within 1 month of other vaccines) , then call us for a prescription  for it to take to a pharmacy that gives the shot , or make a nurse visit to get it here depending on your coverage  Pneumonia vaccine Please send for last mammogram report at check out  Take the ergocalciferol (vitamin D)- once weekly for 12 weeks Schedule labs in 12 weeks for vitamin D level  We will schedule a bone density test at check out

## 2013-07-13 NOTE — Progress Notes (Signed)
Subjective:    Patient ID: Julie [name], female    DOB: [birth year], 79 y.o.   MRN: [id number]  HPI I have personally reviewed the Medicare Annual Wellness questionnaire and have noted 1. The patient's medical and social history 2. Their use of alcohol, tobacco or illicit drugs 3. Their current medications and supplements 4. The patient's functional ability including ADL's, fall risks, home safety risks and hearing or visual             impairment. 5. Diet and physical activities 6. Evidence for depression or mood disorders  The patients weight, height, BMI have been recorded in the chart and visual acuity is per eye clinic.  I have made referrals, counseling and provided education to the patient based review of the above and I have provided the pt with a written personalized care plan for preventive services.  Feels fair  Back gives her a lot of trouble   See scanned forms.  Routine anticipatory guidance given to patient.  See health maintenance. Flu- shot in fall of 2013 Shingles- has had shingles but not the vaccine  PNA-will get the vaccine today Tetanus had vaccine 6/06 Colonoscopy 1/11 - polyps - ? If has a recall at her age  Breast cancer screening-last time needed a follow up - in [place] - ? When -does not think it is due  No lumps on self exam No gyn problems  Advance directive-has a living will and power of attorney (husband)  Cognitive function addressed- see scanned forms- and if abnormal then additional documentation follows.  No major problems with memory   Falls- fell on Monday night- got up out of a chair and lost her balance - fell into her walker  She thinks her bad knee is a problem She needs to use her walker all the time  May consider PT in the future-but has had to go through a lot lately fx in 2013  Mood--is fine/ not depressed   Osteopenia - needs dexa set up  Vit D def- level is 17- does not take any vit D  Does not exercise due to her  chronic back pain much (she has a pedal machine she can use sitting)  Lab Results  Component Value Date   CHOL 211* 07/06/2013   HDL 47.20 07/06/2013   LDLDIRECT 134.7 07/06/2013   TRIG 197.0* 07/06/2013   CHOLHDL 4 07/06/2013      Chemistry      Component Value Date/Time   NA 137 07/06/2013 0950   K 3.8 07/06/2013 0950   CL 104 07/06/2013 0950   CO2 25 07/06/2013 0950   BUN 14 07/06/2013 0950   CREATININE 0.9 07/06/2013 0950      Component Value Date/Time   CALCIUM 9.0 07/06/2013 0950   ALKPHOS 98 07/06/2013 0950   AST 14 07/06/2013 0950   ALT 11 07/06/2013 0950   BILITOT 0.6 07/06/2013 0950        PMH and SH reviewed  Meds, vitals, and allergies reviewed.   ROS: See HPI.  Otherwise negative.     Patient Active Problem List   Diagnosis Date Noted  . Encounter for Medicare annual wellness exam 07/13/2013  . Risk for falls 07/13/2013  . Skin tag of labia 03/14/2013  . Back pain, thoracic 08/07/2011  . Weakness of left leg 07/30/2011  . BACK PAIN, LUMBAR 12/10/2010  . BREAST MASS, RIGHT 09/13/2010  . UNSPECIFIED VITAMIN D DEFICIENCY 08/16/2010  . GERD 03/26/2010  . DYSPEPSIA 03/26/2010  .  IRRITABLE BOWEL SYNDROME 03/26/2010  . OSTEOPENIA 08/27/2009  . HYPERLIPIDEMIA 07/24/2009  . DIVERTICULOSIS, COLON 07/24/2009  . OSTEOARTHRITIS 07/24/2009  . DIARRHEA, PERSISTENT 07/24/2009  . COLONIC POLYPS, HX OF 07/24/2009  . POSTMENOPAUSAL STATUS 07/24/2009   Past Medical History  Diagnosis Date  . GERD (gastroesophageal reflux disease)   . Esophagitis   . Duodenitis   . Chronic leg pain   . Trochanteric bursitis   . OA (osteoarthritis) of knee   . TMJ syndrome   . Seborrheic dermatitis   . HLD (hyperlipidemia)   . Colon polyp   . Diverticulosis of colon     internal----Dr. Mechele Collin  . Hemorrhoids   . Adenoma   . IBS (irritable bowel syndrome)     with PP diarrhea  . Scoliosis   . Osteopenia   . DDD (degenerative disc disease), cervical    Past Surgical History  Procedure Laterality  Date  . Total abdominal hysterectomy    . Hemorrhoid surgery    . Foot surgery  2010    bilateral hammer toes and "knot"  . Colonoscopy  1/11    polyps-hyperplastic and adenomatous  . Cerebral aneurysm repair  2001  . Carpal tunnel release Left 01/18/2013    Procedure: CARPAL TUNNEL RELEASE;  Surgeon: Nicki Reaper, MD;  Location: [facility];  Service: Orthopedics;  Laterality: Left;  OSTEOTOMY LEFT DISTAL RADIUS BONE CHIPS CARPAL TUNNEL RELEASE LEFT   . Wrist osteotomy Left 01/18/2013    Procedure: WRIST OSTEOTOMY;  Surgeon: Nicki Reaper, MD;  Location: [facility];  Service: Orthopedics;  Laterality: Left;   History  Substance Use Topics  . Smoking status: Never Smoker   . Smokeless tobacco: Not on file  . Alcohol Use: No   Family History  Problem Relation Age of Onset  . Colon cancer Mother   . Osteoporosis      hip fracture  . Stroke Brother   . Hypertension Brother   . Other Brother     Heart problem  . Colon cancer Brother   . Colon cancer Maternal Aunt    Allergies  Allergen Reactions  . Codeine     REACTION: headache and nausea and vomiting   Current Outpatient Prescriptions on File Prior to Visit  Medication Sig Dispense Refill  . acetaminophen (TYLENOL) 500 MG tablet Take 500 mg by mouth every 6 (six) hours as needed.        . famotidine (PEPCID AC) 10 MG chewable tablet Chew 10 mg by mouth daily as needed.        Marland Kitchen oxyCODONE-acetaminophen (PERCOCET) 7.5-325 MG per tablet Take 1 tablet by mouth every 4 (four) hours as needed for pain.  30 tablet  0   No current facility-administered medications on file prior to visit.    Review of Systems Review of Systems  Constitutional: Negative for fever, appetite change, fatigue and unexpected weight change.  Eyes: Negative for pain and visual disturbance.  Respiratory: Negative for cough and shortness of breath.   Cardiovascular: Negative for cp or palpitations    Gastrointestinal: Negative  for nausea, diarrhea and constipation.  Genitourinary: Negative for urgency and frequency.  Skin: Negative for pallor or rash   MSK pos for chronic back pain  Neurological: Negative for weakness, light-headedness, numbness and headaches. pos for poor balance  Hematological: Negative for adenopathy. Does not bruise/bleed easily.  Psychiatric/Behavioral: Negative for dysphoric mood. The patient is not nervous/anxious.         Objective:  Physical Exam  Constitutional: She appears well-developed and well-nourished. No distress.  HENT:  Head: Normocephalic and atraumatic.  Right Ear: External ear normal.  Left Ear: External ear normal.  Nose: Nose normal.  Mouth/Throat: Oropharynx is clear and moist.  Eyes: Conjunctivae and EOM are normal. Pupils are equal, round, and reactive to light. Right eye exhibits no discharge. Left eye exhibits no discharge. No scleral icterus.  Neck: Normal range of motion. Neck supple. No JVD present. Carotid bruit is not present. No thyromegaly present.  Cardiovascular: Normal rate, regular rhythm, normal heart sounds and intact distal pulses.  Exam reveals no gallop.   Pulmonary/Chest: Effort normal and breath sounds normal. No respiratory distress. She has no wheezes.  Abdominal: Soft. Bowel sounds are normal. She exhibits no distension, no abdominal bruit and no mass. There is no tenderness.  Genitourinary: No breast swelling, tenderness, discharge or bleeding.  Breast exam: No mass, nodules, thickening, tenderness, bulging, retraction, inflamation, nipple discharge or skin changes noted.  No axillary or clavicular LA.  Chaperoned exam.    Musculoskeletal: She exhibits no edema and no tenderness.  Poor rom spine  Lymphadenopathy:    She has no cervical adenopathy.  Neurological: She is alert. She has normal reflexes. No cranial nerve deficit. She exhibits normal muscle tone. Coordination normal.  Wide based gait-unsteady (not shuffling)  Skin: Skin is warm  and dry. No rash noted. No erythema. No pallor.  Psychiatric: She has a normal mood and affect.          Assessment & Plan:

## 2013-07-14 NOTE — Assessment & Plan Note (Signed)
Disc goals for lipids and reasons to control them Rev labs with pt Rev low sat fat diet in detail   

## 2013-07-14 NOTE — Assessment & Plan Note (Signed)
Will start ergocalciferol 50.000 u weekly for 12 weeks and then re check  Disc imp to overall and bone health

## 2013-07-14 NOTE — Assessment & Plan Note (Signed)
Reviewed health habits including diet and exercise and skin cancer prevention Also reviewed health mt list, fam hx and immunizations  See HPI

## 2013-07-14 NOTE — Assessment & Plan Note (Signed)
Schedule dexa  Disc imp of ca and D and fall prevention  Adv she use walker at all times Exercise disc

## 2013-07-18 ENCOUNTER — Ambulatory Visit: Payer: Self-pay | Admitting: Family Medicine

## 2013-07-18 LAB — HM DEXA SCAN

## 2013-07-25 ENCOUNTER — Encounter: Payer: Self-pay | Admitting: Family Medicine

## 2013-07-26 ENCOUNTER — Encounter: Payer: Self-pay | Admitting: Family Medicine

## 2013-07-28 ENCOUNTER — Encounter: Payer: Self-pay | Admitting: *Deleted

## 2013-07-28 ENCOUNTER — Encounter: Payer: Self-pay | Admitting: Family Medicine

## 2013-08-09 ENCOUNTER — Telehealth: Payer: Self-pay

## 2013-08-09 MED ORDER — ZOSTER VACCINE LIVE 19400 UNT/0.65ML ~~LOC~~ SOLR: 0.6500 mL | Freq: Once | SUBCUTANEOUS | Status: DC

## 2013-08-09 NOTE — Telephone Encounter (Signed)
I will send the px for the zoster vaccine and let her know about mammogram when I get it

## 2013-08-09 NOTE — Telephone Encounter (Signed)
Pt request shingles vaccine prescription sent to [facility].. Pt request cb when rx sent. Pt will wait until after 09/*13/14 before getting flu vaccine since she had a pneumonia vaccine on 07/13/13. Pt also requested mammogram report; Lyla Son spoke with [facility] and they are waiting for outside images before sending mammogram report.

## 2013-08-10 NOTE — Telephone Encounter (Signed)
Pt left v/m requesting cb [phone]

## 2013-08-10 NOTE — Telephone Encounter (Signed)
Left voicemail letting pt know Rx for vaccine sent to pharmacy and we will call her once we get her mammogram results back

## 2013-08-11 NOTE — Telephone Encounter (Signed)
Left voicemail returning pt call.

## 2013-08-12 ENCOUNTER — Encounter: Payer: Self-pay | Admitting: Family Medicine

## 2013-08-15 NOTE — Telephone Encounter (Signed)
We received mammogram results, left voicemail requesting pt to call office

## 2013-08-18 ENCOUNTER — Encounter: Payer: Self-pay | Admitting: *Deleted

## 2013-08-18 NOTE — Telephone Encounter (Signed)
Letter mailed with mammogram results to pt

## 2013-09-06 ENCOUNTER — Encounter: Payer: Self-pay | Admitting: Family Medicine

## 2013-09-06 ENCOUNTER — Ambulatory Visit (INDEPENDENT_AMBULATORY_CARE_PROVIDER_SITE_OTHER)
Admission: RE | Admit: 2013-09-06 | Discharge: 2013-09-06 | Disposition: A | Discharge status: Home / Self Care | Payer: Medicare Other | Source: Ambulatory Visit | Attending: Family Medicine | Admitting: Family Medicine

## 2013-09-06 ENCOUNTER — Ambulatory Visit (INDEPENDENT_AMBULATORY_CARE_PROVIDER_SITE_OTHER): Payer: Medicare Other | Admitting: Family Medicine

## 2013-09-06 VITALS — BP 122/68 | HR 99 | Temp 98.5°F | Ht 61.75 in | Wt 176.2 lb

## 2013-09-06 DIAGNOSIS — S8990XA Unspecified injury of unspecified lower leg, initial encounter: Secondary | ICD-10-CM

## 2013-09-06 DIAGNOSIS — M810 Age-related osteoporosis without current pathological fracture: Secondary | ICD-10-CM

## 2013-09-06 DIAGNOSIS — S99922A Unspecified injury of left foot, initial encounter: Secondary | ICD-10-CM

## 2013-09-06 NOTE — Patient Instructions (Addendum)
I do not see a fracture on your xray today- but I'm waiting for the radiologist to read the film Elevate foot Use intermittent cold and warm treatment  We will call with xray report  Here is a copy of your bone density  When your toe is healed up - we can discuss some options for fracture prevention Continue calcium and vitamin D

## 2013-09-06 NOTE — Progress Notes (Signed)
Subjective:    Patient ID: Julie [name], female    DOB: [birth year], 79 y.o.   MRN: [id number]  HPI Here for L 3rd toe injury  She stubbed it on carpet edge and heard it pop - Sunday evening  It hurt a lot and continues  Soaking it in epsom salts and water  Cannot bear wt without pain  Black and blue and swollen   Last dexa in aug shows osteoporosis  Pt was prev not compliant with ca and D  No prev meds for this  No significant hx of fx / fragility Some ht loss and kyphosis    Patient Active Problem List   Diagnosis Date Noted  . Injury of toe on left foot 09/06/2013  . Encounter for Medicare annual wellness exam 07/13/2013  . Risk for falls 07/13/2013  . Skin tag of labia 03/14/2013  . Back pain, thoracic 08/07/2011  . Weakness of left leg 07/30/2011  . BACK PAIN, LUMBAR 12/10/2010  . BREAST MASS, RIGHT 09/13/2010  . UNSPECIFIED VITAMIN D DEFICIENCY 08/16/2010  . GERD 03/26/2010  . DYSPEPSIA 03/26/2010  . IRRITABLE BOWEL SYNDROME 03/26/2010  . Osteoporosis, unspecified 08/27/2009  . HYPERLIPIDEMIA 07/24/2009  . DIVERTICULOSIS, COLON 07/24/2009  . OSTEOARTHRITIS 07/24/2009  . DIARRHEA, PERSISTENT 07/24/2009  . COLONIC POLYPS, HX OF 07/24/2009  . POSTMENOPAUSAL STATUS 07/24/2009   Past Medical History  Diagnosis Date  . GERD (gastroesophageal reflux disease)   . Esophagitis   . Duodenitis   . Chronic leg pain   . Trochanteric bursitis   . OA (osteoarthritis) of knee   . TMJ syndrome   . Seborrheic dermatitis   . HLD (hyperlipidemia)   . Colon polyp   . Diverticulosis of colon     internal----Dr. Mechele Collin  . Hemorrhoids   . Adenoma   . IBS (irritable bowel syndrome)     with PP diarrhea  . Scoliosis   . Osteopenia   . DDD (degenerative disc disease), cervical    Past Surgical History  Procedure Laterality Date  . Total abdominal hysterectomy    . Hemorrhoid surgery    . Foot surgery  2010    bilateral hammer toes and "knot"  . Colonoscopy  1/11    polyps-hyperplastic and adenomatous  . Cerebral aneurysm repair  2001  . Carpal tunnel release Left 01/18/2013    Procedure: CARPAL TUNNEL RELEASE;  Surgeon: Nicki Reaper, MD;  Location: [facility];  Service: Orthopedics;  Laterality: Left;  OSTEOTOMY LEFT DISTAL RADIUS BONE CHIPS CARPAL TUNNEL RELEASE LEFT   . Wrist osteotomy Left 01/18/2013    Procedure: WRIST OSTEOTOMY;  Surgeon: Nicki Reaper, MD;  Location: [facility];  Service: Orthopedics;  Laterality: Left;   History  Substance Use Topics  . Smoking status: Never Smoker   . Smokeless tobacco: Not on file  . Alcohol Use: No   Family History  Problem Relation Age of Onset  . Colon cancer Mother   . Osteoporosis      hip fracture  . Stroke Brother   . Hypertension Brother   . Other Brother     Heart problem  . Colon cancer Brother   . Colon cancer Maternal Aunt    Allergies  Allergen Reactions  . Codeine     REACTION: headache and nausea and vomiting   Current Outpatient Prescriptions on File Prior to Visit  Medication Sig Dispense Refill  . acetaminophen (TYLENOL) 500 MG tablet Take 500 mg by mouth every  6 (six) hours as needed.        . ergocalciferol (VITAMIN D2) 50000 UNITS capsule Take 1 capsule (50,000 Units total) by mouth once a week.  12 capsule  0  . famotidine (PEPCID AC) 10 MG chewable tablet Chew 10 mg by mouth daily as needed.        . Glucosamine Sulfate 1000 MG CAPS Take 2,000 mg by mouth daily.      Marland Kitchen oxyCODONE-acetaminophen (PERCOCET) 7.5-325 MG per tablet Take 1 tablet by mouth every 4 (four) hours as needed for pain.  30 tablet  0   No current facility-administered medications on file prior to visit.    Review of Systems Review of Systems  Constitutional: Negative for fever, appetite change, fatigue and unexpected weight change.  Eyes: Negative for pain and visual disturbance.  Respiratory: Negative for cough and shortness of breath.   Cardiovascular: Negative for cp or  palpitations    Gastrointestinal: Negative for nausea, diarrhea and constipation.  Genitourinary: Negative for urgency and frequency.  Skin: Negative for pallor or rash   MSK pos for L 3rd toe pain and bruising Neurological: Negative for weakness, light-headedness, numbness and headaches.  Hematological: Negative for adenopathy. Does not bruise/bleed easily.  Psychiatric/Behavioral: Negative for dysphoric mood. The patient is not nervous/anxious.         Objective:   Physical Exam  Constitutional: She appears well-developed and well-nourished. No distress.  HENT:  Head: Normocephalic and atraumatic.  Eyes: Conjunctivae and EOM are normal. Pupils are equal, round, and reactive to light.  Neck: Normal range of motion. Neck supple.  Cardiovascular: Normal rate and regular rhythm.   Musculoskeletal: She exhibits edema and tenderness.  Mild kyphosis  L 3rd toe is ecchymotic and mildly swollen , tender proximally  Nl rom with pain  No crepitus Nl perf and sensation  Neurological: She is alert.  Skin: Skin is warm and dry. No erythema.  Psychiatric: She has a normal mood and affect.          Assessment & Plan:

## 2013-09-07 NOTE — Assessment & Plan Note (Signed)
Ecchymosis and swelling/ tenderness No fx seen on xr For comfort- taped to her 2nd toe with paper tape Recommend elevation and cool/ warm compress intermittently  Update if not starting to improve in a week or if worsening

## 2013-09-07 NOTE — Assessment & Plan Note (Signed)
Rev dexa  May benefit from tx  Will disc after toe is healed  Rev ca and D Exercise when able

## 2013-09-21 ENCOUNTER — Encounter: Payer: Self-pay | Admitting: Family Medicine

## 2013-09-21 ENCOUNTER — Ambulatory Visit (INDEPENDENT_AMBULATORY_CARE_PROVIDER_SITE_OTHER): Payer: Medicare Other | Admitting: Family Medicine

## 2013-09-21 ENCOUNTER — Ambulatory Visit (INDEPENDENT_AMBULATORY_CARE_PROVIDER_SITE_OTHER)
Admission: RE | Admit: 2013-09-21 | Discharge: 2013-09-21 | Disposition: A | Discharge status: Home / Self Care | Payer: Medicare Other | Source: Ambulatory Visit | Attending: Family Medicine | Admitting: Family Medicine

## 2013-09-21 VITALS — BP 122/68 | HR 71 | Temp 97.7°F | Ht 61.75 in | Wt 174.2 lb

## 2013-09-21 DIAGNOSIS — M25539 Pain in unspecified wrist: Secondary | ICD-10-CM

## 2013-09-21 DIAGNOSIS — M79641 Pain in right hand: Secondary | ICD-10-CM | POA: Insufficient documentation

## 2013-09-21 DIAGNOSIS — M25531 Pain in right wrist: Secondary | ICD-10-CM

## 2013-09-21 DIAGNOSIS — M79609 Pain in unspecified limb: Secondary | ICD-10-CM

## 2013-09-21 DIAGNOSIS — M1990 Unspecified osteoarthritis, unspecified site: Secondary | ICD-10-CM

## 2013-09-21 LAB — SEDIMENTATION RATE: Sed Rate: 52 mm/hr — ABNORMAL HIGH (ref 0–22)

## 2013-09-21 NOTE — Progress Notes (Signed)
Subjective:    Patient ID: Julie [name], female    DOB: 25-Mar-1932, 79 y.o.   MRN: [id number]  HPI Here for arthritis pain in her arms She can "hardly use her hands"  Wears a brace on her L hand - for a fx  Now has much difficulty getting up   Dr Otelia Sergeant looked at her R wrist - and agreed she has changes of arthritis  The pain starts in wrist and goes all the way up her arm to the neck  Neck is very difficult to move  Shoulders hurt   Is having a hard time raising her arm to do her hair Also difficulty in wiping herself after a bm (had to cancel her colonoscopy)   No red or hot joints  Some swelling in R wrist  Some pain in her muscles   No injury to her r wrist - but using it more since she broke her L wrist   Takes ibuprofen 2 pills otc bid  Takes percocet for her back at night   Needs vitamin D checked after completing 3 mo of tx   Sees Dr Merlyn Lot for hand specialist   Patient Active Problem List   Diagnosis Date Noted  . Injury of toe on left foot 09/06/2013  . Encounter for Medicare annual wellness exam 07/13/2013  . Risk for falls 07/13/2013  . Skin tag of labia 03/14/2013  . Back pain, thoracic 08/07/2011  . Weakness of left leg 07/30/2011  . BACK PAIN, LUMBAR 12/10/2010  . BREAST MASS, RIGHT 09/13/2010  . UNSPECIFIED VITAMIN D DEFICIENCY 08/16/2010  . GERD 03/26/2010  . DYSPEPSIA 03/26/2010  . IRRITABLE BOWEL SYNDROME 03/26/2010  . Osteoporosis, unspecified 08/27/2009  . HYPERLIPIDEMIA 07/24/2009  . DIVERTICULOSIS, COLON 07/24/2009  . OSTEOARTHRITIS 07/24/2009  . DIARRHEA, PERSISTENT 07/24/2009  . COLONIC POLYPS, HX OF 07/24/2009  . POSTMENOPAUSAL STATUS 07/24/2009   Past Medical History  Diagnosis Date  . GERD (gastroesophageal reflux disease)   . Esophagitis   . Duodenitis   . Chronic leg pain   . Trochanteric bursitis   . OA (osteoarthritis) of knee   . TMJ syndrome   . Seborrheic dermatitis   . HLD (hyperlipidemia)   . Colon polyp   .  Diverticulosis of colon     internal----Dr. Mechele Collin  . Hemorrhoids   . Adenoma   . IBS (irritable bowel syndrome)     with PP diarrhea  . Scoliosis   . Osteopenia   . DDD (degenerative disc disease), cervical    Past Surgical History  Procedure Laterality Date  . Total abdominal hysterectomy    . Hemorrhoid surgery    . Foot surgery  2010    bilateral hammer toes and "knot"  . Colonoscopy  1/11    polyps-hyperplastic and adenomatous  . Cerebral aneurysm repair  2001  . Carpal tunnel release Left 01/18/2013    Procedure: CARPAL TUNNEL RELEASE;  Surgeon: Nicki Reaper, MD;  Location: [facility];  Service: Orthopedics;  Laterality: Left;  OSTEOTOMY LEFT DISTAL RADIUS BONE CHIPS CARPAL TUNNEL RELEASE LEFT   . Wrist osteotomy Left 01/18/2013    Procedure: WRIST OSTEOTOMY;  Surgeon: Nicki Reaper, MD;  Location: [facility];  Service: Orthopedics;  Laterality: Left;   History  Substance Use Topics  . Smoking status: Never Smoker   . Smokeless tobacco: Not on file  . Alcohol Use: No   Family History  Problem Relation Age of Onset  . Colon  cancer Mother   . Osteoporosis      hip fracture  . Stroke Brother   . Hypertension Brother   . Other Brother     Heart problem  . Colon cancer Brother   . Colon cancer Maternal Aunt    Allergies  Allergen Reactions  . Codeine     REACTION: headache and nausea and vomiting   Current Outpatient Prescriptions on File Prior to Visit  Medication Sig Dispense Refill  . acetaminophen (TYLENOL) 500 MG tablet Take 500 mg by mouth every 6 (six) hours as needed.        . ergocalciferol (VITAMIN D2) 50000 UNITS capsule Take 1 capsule (50,000 Units total) by mouth once a week.  12 capsule  0  . famotidine (PEPCID AC) 10 MG chewable tablet Chew 10 mg by mouth daily as needed.        . Glucosamine Sulfate 1000 MG CAPS Take 2,000 mg by mouth daily.      Marland Kitchen oxyCODONE-acetaminophen (PERCOCET) 7.5-325 MG per tablet Take 1 tablet  by mouth every 4 (four) hours as needed for pain.  30 tablet  0   No current facility-administered medications on file prior to visit.    Review of Systems Review of Systems  Constitutional: Negative for fever, appetite change,  and unexpected weight change.  Eyes: Negative for pain and visual disturbance.  Respiratory: Negative for cough and shortness of breath.   Cardiovascular: Negative for cp or palpitations    Gastrointestinal: Negative for nausea, diarrhea and constipation.  Genitourinary: Negative for urgency and frequency.  Skin: Negative for pallor or rash   MSK pos for knee pain from end stage OA (s/p injection) , pain in toe from recent injury and pain in upper extremeties and neck (no swelling other than R wrist) Neurological: Negative for weakness, light-headedness, numbness and headaches.  Hematological: Negative for adenopathy. Does not bruise/bleed easily.  Psychiatric/Behavioral: Negative for dysphoric mood. The patient is not nervous/anxious.         Objective:   Physical Exam  Constitutional: She appears well-developed and well-nourished.  obese and well appearing - sitting on her walker  HENT:  Head: Normocephalic and atraumatic.  Eyes: Conjunctivae and EOM are normal. Pupils are equal, round, and reactive to light.  Neck: Normal range of motion. Neck supple.  No cervical tenderness or crepitus  Full rom with some pain on full flex and ext   Cardiovascular: Normal rate and regular rhythm.   Pulmonary/Chest: Effort normal and breath sounds normal.  Musculoskeletal: She exhibits edema and tenderness.  R wrist is diffusely enlarged (? Swelling vs deformity) with tenderness medially and no skin change or crepitus  Also tender in MCP joints  Pain to flex MCP and make a fist  Pain to flex wrist - with limited rom  Pain to supinate   No elbow deformity or tenderness  No shoulder deformity or tenderness Neg hawkings test  Limited abd of arm (to 90 deg) Tender  in trapezius bilaterally   Lymphadenopathy:    She has no cervical adenopathy.  Neurological: She is alert. She has normal reflexes.  Skin: Skin is warm and dry. No rash noted. No erythema. No pallor.  Psychiatric: Her mood appears anxious.  Somewhat tearful today discussing marital problems - husband is not sympathetic to her ongoing pain issues          Assessment & Plan:

## 2013-09-21 NOTE — Patient Instructions (Signed)
Xray of wrist and hand today- looking for arthritis and erosive changes  Also some labs for other conditions that cause joint pain (PMR or rheumatoid arthritis)  We will contact you with results and make a plan

## 2013-09-21 NOTE — Assessment & Plan Note (Signed)
Xray today  Suspect OA but also want to r/o autoimmune joint issue

## 2013-09-21 NOTE — Assessment & Plan Note (Signed)
With swelling/deformity and limited rom with pain  Has been favoring this hand since she fx the other wrist  Xray today  Labs for rheum dz  She takes percocet qhs , ibuprofen bid  May consider f/u Dr Merlyn Lot if labs are nl  In light of UE pain in whole extremety --- checking ESR to r/o PMR as well

## 2013-09-21 NOTE — Assessment & Plan Note (Signed)
End stage in knee-s/p lubricant injection -sees Dr Otelia Sergeant Suspect also in spine and hands R wrist and hand xray today

## 2013-09-22 ENCOUNTER — Telehealth: Payer: Self-pay | Admitting: Family Medicine

## 2013-09-22 DIAGNOSIS — M25531 Pain in right wrist: Secondary | ICD-10-CM

## 2013-09-22 DIAGNOSIS — M79641 Pain in right hand: Secondary | ICD-10-CM

## 2013-09-22 LAB — RHEUMATOID FACTOR: Rhuematoid fact SerPl-aCnc: <10 IU/mL (ref <=14)

## 2013-09-22 LAB — ANA: Anti Nuclear Antibody(ANA): NEGATIVE

## 2013-09-22 NOTE — Telephone Encounter (Signed)
Then I want to get her back to her hand specialist  I will do a referral to get her in asap

## 2013-09-22 NOTE — Telephone Encounter (Signed)
Message copied by Judy Pimple on Thu Sep 22, 2013  4:20 PM ------      Message from: Shon Millet      Created: Thu Sep 22, 2013  4:10 PM       Pt advise of xray results and she let me know that her pain and swelling is worse, pt said she is in so much pain she couldn't even hold her fork to eat, please advise what she should do ------

## 2013-09-23 NOTE — Telephone Encounter (Signed)
Pt aware.

## 2013-10-05 ENCOUNTER — Other Ambulatory Visit (INDEPENDENT_AMBULATORY_CARE_PROVIDER_SITE_OTHER): Payer: Medicare Other

## 2013-10-05 ENCOUNTER — Telehealth (INDEPENDENT_AMBULATORY_CARE_PROVIDER_SITE_OTHER): Payer: Medicare Other | Admitting: Family Medicine

## 2013-10-05 DIAGNOSIS — M810 Age-related osteoporosis without current pathological fracture: Secondary | ICD-10-CM

## 2013-10-05 DIAGNOSIS — E785 Hyperlipidemia, unspecified: Secondary | ICD-10-CM

## 2013-10-05 DIAGNOSIS — M79641 Pain in right hand: Secondary | ICD-10-CM

## 2013-10-05 DIAGNOSIS — M25531 Pain in right wrist: Secondary | ICD-10-CM

## 2013-10-05 DIAGNOSIS — E559 Vitamin D deficiency, unspecified: Secondary | ICD-10-CM

## 2013-10-05 DIAGNOSIS — M25539 Pain in unspecified wrist: Secondary | ICD-10-CM

## 2013-10-05 DIAGNOSIS — M79609 Pain in unspecified limb: Secondary | ICD-10-CM

## 2013-10-05 LAB — SEDIMENTATION RATE: Sed Rate: 34 mm/hr — ABNORMAL HIGH (ref 0–22)

## 2013-10-05 NOTE — Telephone Encounter (Signed)
Message copied by Judy Pimple on Wed Oct 05, 2013  6:13 AM ------      Message from: Alvina Chou      Created: Tue Oct 04, 2013  9:35 AM      Regarding: Lab orders for Wednesday, 11.5.14       F/u labs ------

## 2013-10-06 LAB — VITAMIN D 25 HYDROXY (VIT D DEFICIENCY, FRACTURES): Vit D, 25-Hydroxy: 37 ng/mL (ref 30–89)

## 2013-10-11 ENCOUNTER — Encounter: Payer: Self-pay | Admitting: Family Medicine

## 2013-10-11 ENCOUNTER — Ambulatory Visit (INDEPENDENT_AMBULATORY_CARE_PROVIDER_SITE_OTHER): Payer: Medicare Other | Admitting: Family Medicine

## 2013-10-11 VITALS — BP 122/68 | HR 78 | Temp 99.1°F | Ht 61.75 in | Wt 173.0 lb

## 2013-10-11 DIAGNOSIS — Z860100 Personal history of colon polyps, unspecified: Secondary | ICD-10-CM

## 2013-10-11 DIAGNOSIS — M353 Polymyalgia rheumatica: Secondary | ICD-10-CM

## 2013-10-11 MED ORDER — PREDNISONE 20 MG PO TABS: 20.0000 mg | ORAL_TABLET | Freq: Every day | ORAL | Status: DC

## 2013-10-11 NOTE — Patient Instructions (Signed)
I think you have a condition called PMR (polymyalgia rheumatica)- it causes pain mainly in the upper body I recommend you take prednisone 20 mg daily for a month- and then we will gradually lower the dose  Let me know in the next week if you are not improving  Follow up with me in about a month

## 2013-10-11 NOTE — Progress Notes (Signed)
Subjective:    Patient ID: Julie [name], female    DOB: 06-Feb-1932, 79 y.o.   MRN: [id number]  HPI Here for f/u of UE pain  Saw hand ortho doctor after having neg Rheum panel here (with the exception of ESR at 54)  Seen there- dx [facility] arthritis as well as ulnar joint deg change and CS deg change Put in splint Put on pred dosepak rec see Dr Ophelia Charter for neck ( actually saw Dr Otelia Sergeant and will be having nerve tests soon for neck)  Repeat ESR 34   Initially the steroids really helped - and they helped immensely  Medrol dosepack followed by 10 mg prednisone  Now is confused about dosing - 10 mg daily   It does give her difficulty sleeping   Was given hydrocodone   Patient Active Problem List   Diagnosis Date Noted  . Right wrist pain 09/21/2013  . Right hand pain 09/21/2013  . Injury of toe on left foot 09/06/2013  . Encounter for Medicare annual wellness exam 07/13/2013  . Risk for falls 07/13/2013  . Skin tag of labia 03/14/2013  . Back pain, thoracic 08/07/2011  . Weakness of left leg 07/30/2011  . BACK PAIN, LUMBAR 12/10/2010  . BREAST MASS, RIGHT 09/13/2010  . UNSPECIFIED VITAMIN D DEFICIENCY 08/16/2010  . GERD 03/26/2010  . DYSPEPSIA 03/26/2010  . IRRITABLE BOWEL SYNDROME 03/26/2010  . Osteoporosis, unspecified 08/27/2009  . HYPERLIPIDEMIA 07/24/2009  . DIVERTICULOSIS, COLON 07/24/2009  . OSTEOARTHRITIS 07/24/2009  . DIARRHEA, PERSISTENT 07/24/2009  . COLONIC POLYPS, HX OF 07/24/2009  . POSTMENOPAUSAL STATUS 07/24/2009   Past Medical History  Diagnosis Date  . GERD (gastroesophageal reflux disease)   . Esophagitis   . Duodenitis   . Chronic leg pain   . Trochanteric bursitis   . OA (osteoarthritis) of knee   . TMJ syndrome   . Seborrheic dermatitis   . HLD (hyperlipidemia)   . Colon polyp   . Diverticulosis of colon     internal----Dr. Mechele Collin  . Hemorrhoids   . Adenoma   . IBS (irritable bowel syndrome)     with PP diarrhea  . Scoliosis   .  Osteopenia   . DDD (degenerative disc disease), cervical    Past Surgical History  Procedure Laterality Date  . Total abdominal hysterectomy    . Hemorrhoid surgery    . Foot surgery  2010    bilateral hammer toes and "knot"  . Colonoscopy  1/11    polyps-hyperplastic and adenomatous  . Cerebral aneurysm repair  2001  . Carpal tunnel release Left 01/18/2013    Procedure: CARPAL TUNNEL RELEASE;  Surgeon: Nicki Reaper, MD;  Location: [facility];  Service: Orthopedics;  Laterality: Left;  OSTEOTOMY LEFT DISTAL RADIUS BONE CHIPS CARPAL TUNNEL RELEASE LEFT   . Wrist osteotomy Left 01/18/2013    Procedure: WRIST OSTEOTOMY;  Surgeon: Nicki Reaper, MD;  Location: [facility];  Service: Orthopedics;  Laterality: Left;   History  Substance Use Topics  . Smoking status: Never Smoker   . Smokeless tobacco: Not on file  . Alcohol Use: No   Family History  Problem Relation Age of Onset  . Colon cancer Mother   . Osteoporosis      hip fracture  . Stroke Brother   . Hypertension Brother   . Other Brother     Heart problem  . Colon cancer Brother   . Colon cancer Maternal Aunt    Allergies  Allergen Reactions  . Codeine     REACTION: headache and nausea and vomiting   Current Outpatient Prescriptions on File Prior to Visit  Medication Sig Dispense Refill  . acetaminophen (TYLENOL) 500 MG tablet Take 500 mg by mouth every 6 (six) hours as needed.        . ergocalciferol (VITAMIN D2) 50000 UNITS capsule Take 1 capsule (50,000 Units total) by mouth once a week.  12 capsule  0  . famotidine (PEPCID AC) 10 MG chewable tablet Chew 10 mg by mouth daily as needed.        . Glucosamine Sulfate 1000 MG CAPS Take 2,000 mg by mouth daily.      Marland Kitchen oxyCODONE-acetaminophen (PERCOCET) 7.5-325 MG per tablet Take 1 tablet by mouth every 4 (four) hours as needed for pain.  30 tablet  0   No current facility-administered medications on file prior to visit.      Review of  Systems Review of Systems  Constitutional: Negative for fever, appetite change,  and unexpected weight change. pos for fatigue  Eyes: Negative for pain and visual disturbance.  Respiratory: Negative for cough and shortness of breath.   Cardiovascular: Negative for cp or palpitations    Gastrointestinal: Negative for nausea, diarrhea and constipation.  Genitourinary: Negative for urgency and frequency.  Skin: Negative for pallor or rash   MSK pos for chronic pain / worse in UE with some wrist swelling  Neurological: Negative for weakness, light-headedness, numbness and headaches.  Hematological: Negative for adenopathy. Does not bruise/bleed easily.  Psychiatric/Behavioral: Negative for dysphoric mood. The patient is not nervous/anxious.         Objective:   Physical Exam  Constitutional: She appears well-developed and well-nourished. No distress.  obese and well appearing   HENT:  Head: Normocephalic and atraumatic.  Mouth/Throat: Oropharynx is clear and moist.  Eyes: Conjunctivae and EOM are normal. Pupils are equal, round, and reactive to light. No scleral icterus.  Neck: Normal range of motion. Neck supple.  Cardiovascular: Normal rate and regular rhythm.   Pulmonary/Chest: Effort normal and breath sounds normal.  Musculoskeletal: She exhibits tenderness. She exhibits no edema.  Limited rom - in upper ext / shoulder girdle and wrists due to pain Wearing soft splints on both wrists  No myofascial trigger points No temporal tenderness  Lymphadenopathy:    She has no cervical adenopathy.  Neurological: She is alert. She has normal reflexes. No cranial nerve deficit. She exhibits normal muscle tone. Coordination normal.  Skin: Skin is warm and dry. No rash noted. No erythema.  Psychiatric: She has a normal mood and affect.          Assessment & Plan:

## 2013-10-11 NOTE — Progress Notes (Signed)
Pre-visit discussion using our clinic review tool. No additional management support is needed unless otherwise documented below in the visit note.  

## 2013-10-12 NOTE — Assessment & Plan Note (Addendum)
Mixed picture of this with OA of neck and wrists/hands  Pt had great imp at original higher pred dose/ medrol and now symptoms are back   (also interval dec in ESR with steroids) Handout given on disorder Px pred 20 daily for 1 mo and then f/u Disc side eff in great detail as well Will update if no imp

## 2013-10-31 ENCOUNTER — Encounter (HOSPITAL_BASED_OUTPATIENT_CLINIC_OR_DEPARTMENT_OTHER): Payer: Self-pay | Admitting: *Deleted

## 2013-10-31 ENCOUNTER — Other Ambulatory Visit: Payer: Self-pay | Admitting: Orthopedic Surgery

## 2013-10-31 NOTE — Progress Notes (Signed)
Pt has severe arthritis-on prednisone-will need [employer]

## 2013-11-01 ENCOUNTER — Ambulatory Visit (HOSPITAL_BASED_OUTPATIENT_CLINIC_OR_DEPARTMENT_OTHER): Payer: Medicare Other | Admitting: Anesthesiology

## 2013-11-01 ENCOUNTER — Encounter (HOSPITAL_BASED_OUTPATIENT_CLINIC_OR_DEPARTMENT_OTHER)
Admission: RE | Disposition: A | Discharge status: Home / Self Care | Payer: Self-pay | Source: Ambulatory Visit | Attending: Orthopedic Surgery

## 2013-11-01 ENCOUNTER — Encounter (HOSPITAL_BASED_OUTPATIENT_CLINIC_OR_DEPARTMENT_OTHER): Payer: Self-pay | Admitting: Orthopedic Surgery

## 2013-11-01 ENCOUNTER — Encounter (HOSPITAL_BASED_OUTPATIENT_CLINIC_OR_DEPARTMENT_OTHER): Payer: Medicare Other | Admitting: Anesthesiology

## 2013-11-01 ENCOUNTER — Ambulatory Visit (HOSPITAL_BASED_OUTPATIENT_CLINIC_OR_DEPARTMENT_OTHER)
Admission: RE | Admit: 2013-11-01 | Discharge: 2013-11-01 | Disposition: A | Discharge status: Home / Self Care | Payer: Medicare Other | Source: Ambulatory Visit | Attending: Orthopedic Surgery | Admitting: Orthopedic Surgery

## 2013-11-01 DIAGNOSIS — IMO0002 Reserved for concepts with insufficient information to code with codable children: Secondary | ICD-10-CM | POA: Insufficient documentation

## 2013-11-01 DIAGNOSIS — W19XXXA Unspecified fall, initial encounter: Secondary | ICD-10-CM | POA: Insufficient documentation

## 2013-11-01 DIAGNOSIS — S63639A Sprain of interphalangeal joint of unspecified finger, initial encounter: Secondary | ICD-10-CM | POA: Insufficient documentation

## 2013-11-01 HISTORY — PX: OPEN REDUCTION INTERNAL FIXATION (ORIF) FINGER WITH RADIAL BONE GRAFT: SHX5666

## 2013-11-01 SURGERY — OPEN REDUCTION INTERNAL FIXATION (ORIF) FINGER WITH RADIAL BONE GRAFT
Anesthesia: General | Site: Finger | Laterality: Left

## 2013-11-01 MED ORDER — FENTANYL CITRATE 0.05 MG/ML IJ SOLN
INTRAMUSCULAR | Status: DC | PRN
  Administered 2013-11-01: 75 ug via INTRAVENOUS
  Administered 2013-11-01: 25 ug via INTRAVENOUS

## 2013-11-01 MED ORDER — LACTATED RINGERS IV SOLN
INTRAVENOUS | Status: DC
  Administered 2013-11-01: 12:00:00 via INTRAVENOUS

## 2013-11-01 MED ORDER — FENTANYL CITRATE 0.05 MG/ML IJ SOLN: 50.0000 ug | INTRAMUSCULAR | Status: DC | PRN

## 2013-11-01 MED ORDER — HYDROCODONE-ACETAMINOPHEN 5-325 MG PO TABS: 1.0000 | ORAL_TABLET | Freq: Four times a day (QID) | ORAL | Status: DC | PRN

## 2013-11-01 MED ORDER — CHLORHEXIDINE GLUCONATE 4 % EX LIQD: 60.0000 mL | Freq: Once | CUTANEOUS | Status: DC

## 2013-11-01 MED ORDER — CEFAZOLIN SODIUM-DEXTROSE 2-3 GM-% IV SOLR
2.0000 g | INTRAVENOUS | Status: AC
  Administered 2013-11-01: 2 g via INTRAVENOUS

## 2013-11-01 MED ORDER — DEXAMETHASONE SODIUM PHOSPHATE 10 MG/ML IJ SOLN
INTRAMUSCULAR | Status: DC | PRN
  Administered 2013-11-01: 5 mg via INTRAVENOUS

## 2013-11-01 MED ORDER — MIDAZOLAM HCL 2 MG/2ML IJ SOLN: 1.0000 mg | INTRAMUSCULAR | Status: DC | PRN

## 2013-11-01 MED ORDER — PROPOFOL 10 MG/ML IV BOLUS
INTRAVENOUS | Status: DC | PRN
  Administered 2013-11-01: 150 mg via INTRAVENOUS
  Administered 2013-11-01: 30 mg via INTRAVENOUS

## 2013-11-01 MED ORDER — ONDANSETRON HCL 4 MG/2ML IJ SOLN
INTRAMUSCULAR | Status: DC | PRN
  Administered 2013-11-01: 4 mg via INTRAVENOUS

## 2013-11-01 MED ORDER — FENTANYL CITRATE 0.05 MG/ML IJ SOLN
INTRAMUSCULAR | Status: AC
  Filled 2013-11-01: qty 2

## 2013-11-01 MED ORDER — HYDROCODONE-ACETAMINOPHEN 5-325 MG PO TABS
1.0000 | ORAL_TABLET | Freq: Once | ORAL | Status: AC
  Administered 2013-11-01: 1 via ORAL

## 2013-11-01 MED ORDER — PROPOFOL 10 MG/ML IV BOLUS
INTRAVENOUS | Status: AC
  Filled 2013-11-01: qty 20

## 2013-11-01 MED ORDER — 0.9 % SODIUM CHLORIDE (POUR BTL) OPTIME
TOPICAL | Status: DC | PRN
  Administered 2013-11-01: 200 mL

## 2013-11-01 MED ORDER — FENTANYL CITRATE 0.05 MG/ML IJ SOLN
INTRAMUSCULAR | Status: AC
  Filled 2013-11-01: qty 4

## 2013-11-01 MED ORDER — LIDOCAINE HCL (CARDIAC) 20 MG/ML IV SOLN
INTRAVENOUS | Status: DC | PRN
  Administered 2013-11-01: 100 mg via INTRAVENOUS

## 2013-11-01 MED ORDER — PROMETHAZINE HCL 25 MG/ML IJ SOLN: 6.2500 mg | INTRAMUSCULAR | Status: DC | PRN

## 2013-11-01 MED ORDER — FENTANYL CITRATE 0.05 MG/ML IJ SOLN
25.0000 ug | INTRAMUSCULAR | Status: DC | PRN
  Administered 2013-11-01 (×2): 25 ug via INTRAVENOUS

## 2013-11-01 MED ORDER — BUPIVACAINE HCL (PF) 0.25 % IJ SOLN
INTRAMUSCULAR | Status: DC | PRN
  Administered 2013-11-01: 5 mL

## 2013-11-01 MED ORDER — HYDROCODONE-ACETAMINOPHEN 5-325 MG PO TABS
ORAL_TABLET | ORAL | Status: AC
  Filled 2013-11-01: qty 1

## 2013-11-01 MED ORDER — FENTANYL CITRATE 0.05 MG/ML IJ SOLN: 50.0000 ug | Freq: Once | INTRAMUSCULAR | Status: DC

## 2013-11-01 MED ORDER — MIDAZOLAM HCL 2 MG/2ML IJ SOLN
INTRAMUSCULAR | Status: AC
  Filled 2013-11-01: qty 2

## 2013-11-01 SURGICAL SUPPLY — 54 items
BANDAGE GAUZE ELAST BULKY 4 IN (GAUZE/BANDAGES/DRESSINGS) IMPLANT
BLADE MINI RND TIP GREEN BEAV (BLADE) IMPLANT
BLADE SURG 15 STRL LF DISP TIS (BLADE) ×1 IMPLANT
BLADE SURG 15 STRL SS (BLADE) ×2
BNDG CMPR 9X4 STRL LF SNTH (GAUZE/BANDAGES/DRESSINGS) ×1
BNDG COHESIVE 3X5 TAN STRL LF (GAUZE/BANDAGES/DRESSINGS) ×1 IMPLANT
BNDG ESMARK 4X9 LF (GAUZE/BANDAGES/DRESSINGS) ×2 IMPLANT
CHLORAPREP W/TINT 26ML (MISCELLANEOUS) ×2 IMPLANT
CORDS BIPOLAR (ELECTRODE) ×2 IMPLANT
COVER MAYO STAND STRL (DRAPES) ×2 IMPLANT
COVER TABLE BACK 60X90 (DRAPES) ×2 IMPLANT
CUFF TOURNIQUET SINGLE 18IN (TOURNIQUET CUFF) ×1 IMPLANT
DECANTER SPIKE VIAL GLASS SM (MISCELLANEOUS) IMPLANT
DRAPE EXTREMITY T 121X128X90 (DRAPE) ×2 IMPLANT
DRAPE OEC MINIVIEW 54X84 (DRAPES) ×2 IMPLANT
DRAPE SURG 17X23 STRL (DRAPES) ×2 IMPLANT
GAUZE XEROFORM 1X8 LF (GAUZE/BANDAGES/DRESSINGS) ×2 IMPLANT
GLOVE BIO SURGEON STRL SZ 6.5 (GLOVE) ×1 IMPLANT
GLOVE BIO SURGEON STRL SZ7.5 (GLOVE) ×1 IMPLANT
GLOVE BIOGEL PI IND STRL 7.0 (GLOVE) IMPLANT
GLOVE BIOGEL PI IND STRL 8 (GLOVE) IMPLANT
GLOVE BIOGEL PI IND STRL 8.5 (GLOVE) ×1 IMPLANT
GLOVE BIOGEL PI INDICATOR 7.0 (GLOVE) ×1
GLOVE BIOGEL PI INDICATOR 8 (GLOVE) ×1
GLOVE BIOGEL PI INDICATOR 8.5 (GLOVE) ×1
GLOVE ECLIPSE 6.5 STRL STRAW (GLOVE) ×1 IMPLANT
GLOVE EXAM NITRILE LRG STRL (GLOVE) ×1 IMPLANT
GLOVE SURG ORTHO 8.0 STRL STRW (GLOVE) ×2 IMPLANT
GOWN BRE IMP PREV XXLGXLNG (GOWN DISPOSABLE) ×3 IMPLANT
GOWN PREVENTION PLUS XLARGE (GOWN DISPOSABLE) ×1 IMPLANT
K-WIRE PROS .028 4 (WIRE) ×1 IMPLANT
NEEDLE 27GAX1X1/2 (NEEDLE) ×1 IMPLANT
NS IRRIG 1000ML POUR BTL (IV SOLUTION) ×2 IMPLANT
PACK BASIN DAY SURGERY FS (CUSTOM PROCEDURE TRAY) ×2 IMPLANT
PAD CAST 3X4 CTTN HI CHSV (CAST SUPPLIES) ×1 IMPLANT
PADDING CAST ABS 4INX4YD NS (CAST SUPPLIES) ×1
PADDING CAST ABS COTTON 4X4 ST (CAST SUPPLIES) ×1 IMPLANT
PADDING CAST COTTON 3X4 STRL (CAST SUPPLIES)
SLEEVE SCD COMPRESS KNEE MED (MISCELLANEOUS) ×1 IMPLANT
SPLINT FINGER 5/8X2.25 (CAST SUPPLIES) IMPLANT
SPLINT PLASTALUME 2 1/4 (CAST SUPPLIES) ×2
SPLINT PLASTER CAST XFAST 3X15 (CAST SUPPLIES) IMPLANT
SPLINT PLASTER XTRA FASTSET 3X (CAST SUPPLIES)
SPONGE GAUZE 4X4 12PLY (GAUZE/BANDAGES/DRESSINGS) ×2 IMPLANT
STOCKINETTE 4X48 STRL (DRAPES) ×2 IMPLANT
SUT CHROMIC 5 0 P 3 (SUTURE) IMPLANT
SUT MERSILENE 4 0 P 3 (SUTURE) IMPLANT
SUT VICRYL 4-0 PS2 18IN ABS (SUTURE) IMPLANT
SUT VICRYL RAPID 5 0 P 3 (SUTURE) IMPLANT
SUT VICRYL RAPIDE 4/0 PS 2 (SUTURE) ×2 IMPLANT
SYR BULB 3OZ (MISCELLANEOUS) ×2 IMPLANT
SYR CONTROL 10ML LL (SYRINGE) ×1 IMPLANT
TOWEL OR 17X24 6PK STRL BLUE (TOWEL DISPOSABLE) ×2 IMPLANT
UNDERPAD 30X30 INCONTINENT (UNDERPADS AND DIAPERS) ×2 IMPLANT

## 2013-11-01 NOTE — Op Note (Signed)
Dictation Number [id number]

## 2013-11-01 NOTE — Anesthesia Postprocedure Evaluation (Signed)
  Anesthesia Post-op Note  Patient: Julie [name]  Procedure(s) Performed: Procedure(s): OPEN REDUCTION INTERNAL FIXATION (ORIF) LEFT SMALL FINGER (Left)  Patient Location: PACU  Anesthesia Type:General  Level of Consciousness: awake and alert   Airway and Oxygen Therapy: Patient Spontanous Breathing  Post-op Pain: mild  Post-op Assessment: Post-op Vital signs reviewed, Patient's Cardiovascular Status Stable, Respiratory Function Stable, Patent Airway, No signs of Nausea or vomiting and Pain level controlled  Post-op Vital Signs: Reviewed and stable  Complications: No apparent anesthesia complications

## 2013-11-01 NOTE — Anesthesia Preprocedure Evaluation (Signed)
Anesthesia Evaluation  Patient identified by MRN, date of birth, ID band Patient awake    Reviewed: Allergy & Precautions, H&P , NPO status , Patient's Chart, lab work & pertinent test results  Airway Mallampati: II TM Distance: <3 FB Neck ROM: Full    Dental   Pulmonary  breath sounds clear to auscultation        Cardiovascular Rhythm:Regular Rate:Normal     Neuro/Psych H/o cerebral art aneurysm    GI/Hepatic GERD-  Controlled,  Endo/Other    Renal/GU      Musculoskeletal   Abdominal (+) + obese,   Peds  Hematology   Anesthesia Other Findings TMJ  Reproductive/Obstetrics                           Anesthesia Physical Anesthesia Plan  ASA: II  Anesthesia Plan: General   Post-op Pain Management:    Induction: Intravenous  Airway Management Planned: LMA  Additional Equipment:   Intra-op Plan:   Post-operative Plan: Extubation in OR  Informed Consent: I have reviewed the patients History and Physical, chart, labs and discussed the procedure including the risks, benefits and alternatives for the proposed anesthesia with the patient or authorized representative who has indicated his/her understanding and acceptance.     Plan Discussed with: CRNA and Surgeon  Anesthesia Plan Comments:         Anesthesia Quick Evaluation

## 2013-11-01 NOTE — H&P (Signed)
Julie [name] is a 79 year-old right-hand dominant female who suffered a fall on 10/26/13 sustaining injury to her left small finger. She buddy taped this. She complains of pain, swelling at the PIP joint of her left small finger. She has no prior history of injuries. She states that she lost her balance falling into TV console when she was attempting to answer the phone.  Her sister is in Hospice and she was concerned with respect to this.  She states that she simply lost her balance.  She complains of a moderate aching type pain with a feeling of swelling.  Any movement causes increased discomfort for her. She has no history of diabetes.   ALLERGIES:  Codeine.  MEDICATIONS:  Cymbalta.  She has taken hydrocodone.    SURGICAL HISTORY:  Hysterectomy, colectomy, aneurysm, correction of malunion and carpal tunnel release on her right hand.  FAMILY MEDICAL HISTORY:  Negative.  SOCIAL HISTORY:   She does not smoke or drink. She is married.   REVIEW OF SYSTEMS:   Positive for sleep disorder, otherwise negative 14 points. Julie [name] is an 79 y.o. female.   Chief Complaint: central slip fracture left small finger HPI: see above  Past Medical History  Diagnosis Date  . GERD (gastroesophageal reflux disease)   . Esophagitis   . Duodenitis   . Chronic leg pain   . Trochanteric bursitis   . OA (osteoarthritis) of knee   . TMJ syndrome   . Seborrheic dermatitis   . HLD (hyperlipidemia)   . Colon polyp   . Diverticulosis of colon     internal----Dr. Mechele Collin  . Hemorrhoids   . Adenoma   . IBS (irritable bowel syndrome)     with PP diarrhea  . Scoliosis   . Osteopenia   . DDD (degenerative disc disease), cervical     Past Surgical History  Procedure Laterality Date  . Total abdominal hysterectomy    . Hemorrhoid surgery    . Foot surgery  2010    bilateral hammer toes and "knot"  . Colonoscopy  1/11    polyps-hyperplastic and adenomatous  . Carpal tunnel release Left  01/18/2013    Procedure: CARPAL TUNNEL RELEASE;  Surgeon: Nicki Reaper, MD;  Location: [facility];  Service: Orthopedics;  Laterality: Left;  OSTEOTOMY LEFT DISTAL RADIUS BONE CHIPS CARPAL TUNNEL RELEASE LEFT   . Wrist osteotomy Left 01/18/2013    Procedure: WRIST OSTEOTOMY;  Surgeon: Nicki Reaper, MD;  Location: [facility];  Service: Orthopedics;  Laterality: Left;  . Cerebral aneurysm repair  2001    Family History  Problem Relation Age of Onset  . Colon cancer Mother   . Osteoporosis      hip fracture  . Stroke Brother   . Hypertension Brother   . Other Brother     Heart problem  . Colon cancer Brother   . Colon cancer Maternal Aunt    Social History:  reports that she has never smoked. She does not have any smokeless tobacco history on file. She reports that she does not drink alcohol or use illicit drugs.  Allergies:  Allergies  Allergen Reactions  . Codeine     REACTION: headache and nausea and vomiting    No prescriptions prior to admission    No results found for this or any previous visit (from the past 48 hour(s)).  No results found.   Pertinent items are noted in HPI.  Height 5\' 2"  (1.575  m), weight 170 lb (77.111 kg).  General appearance: alert, cooperative and appears stated age Head: Normocephalic, without obvious abnormality Neck: no JVD Resp: clear to auscultation bilaterally Cardio: regular rate and rhythm, S1, S2 normal, no murmur, click, rub or gallop GI: soft, non-tender; bowel sounds normal; no masses,  no organomegaly Extremities: extremities normal, atraumatic, no cyanosis or edema Pulses: 2+ and symmetric Skin: Skin color, texture, turgor normal. No rashes or lesions Neurologic: Grossly normal Incision/Wound: na  Assessment/Plan RADIOGRAPHS:   X-rays reveal an avulsion of the central slip PIP joint left small finger.  DIAGNOSIS:  Central slip fracture left small finger.  RECOMMENDATIONS/PLAN:   We would  recommend ORIF of this with repair of the extensor tendon to prevent boutonniere deformity.    She is sent to therapy for the application of a splint, hand-based at the present time.  This is scheduled as an outpatient under regional anesthesia.  Miliano Cotten R 11/01/2013, 10:32 AM

## 2013-11-01 NOTE — Brief Op Note (Signed)
11/01/2013  1:39 PM  PATIENT:  Julie [name]  79 y.o. female  PRE-OPERATIVE DIAGNOSIS:  CENTRAL SLIP FRACTURE LEFT SMALL FINGER  POST-OPERATIVE DIAGNOSIS:  CENTRAL SLIP FRACTURE LEFT SMALL FINGER  PROCEDURE:  Procedure(s): OPEN REDUCTION INTERNAL FIXATION (ORIF) LEFT SMALL FINGER (Left)  SURGEON:  Surgeon(s) and Role:    * Nicki Reaper, MD - Primary    * Tami Ribas, MD - Assisting  PHYSICIAN ASSISTANT:   ASSISTANTS: K Jolinda Pinkstaff,MD   ANESTHESIA:   local and general  EBL:  Total I/O In: 700 [I.V.:700] Out: -   BLOOD ADMINISTERED:none  DRAINS: none   LOCAL MEDICATIONS USED:  MARCAINE     SPECIMEN:  No Specimen  DISPOSITION OF SPECIMEN:  N/A  COUNTS:  YES  TOURNIQUET:   Total Tourniquet Time Documented: Upper Arm (Left) - 19 minutes Total: Upper Arm (Left) - 19 minutes   DICTATION: .Other Dictation: Dictation Number [id number]  PLAN OF CARE: Discharge to home after PACU  PATIENT DISPOSITION:  PACU - hemodynamically stable.

## 2013-11-01 NOTE — Anesthesia Procedure Notes (Signed)
Procedure Name: LMA Insertion Date/Time: 11/01/2013 1:07 PM Performed by: Burna Cash Pre-anesthesia Checklist: Patient identified, Emergency Drugs available, Suction available and Patient being monitored Patient Re-evaluated:Patient Re-evaluated prior to inductionOxygen Delivery Method: Circle System Utilized Preoxygenation: Pre-oxygenation with 100% oxygen Intubation Type: IV induction Ventilation: Mask ventilation without difficulty LMA: LMA inserted LMA Size: 4.0 Number of attempts: 1 Airway Equipment and Method: bite block Placement Confirmation: positive ETCO2 Tube secured with: Tape Dental Injury: Teeth and Oropharynx as per pre-operative assessment

## 2013-11-01 NOTE — Transfer of Care (Signed)
Immediate Anesthesia Transfer of Care Note  Patient: Julie [name]  Procedure(s) Performed: Procedure(s): OPEN REDUCTION INTERNAL FIXATION (ORIF) LEFT SMALL FINGER (Left)  Patient Location: PACU  Anesthesia Type:General  Level of Consciousness: sedated  Airway & Oxygen Therapy: Patient Spontanous Breathing and Patient connected to face mask oxygen  Post-op Assessment: Report given to PACU RN and Post -op Vital signs reviewed and stable  Post vital signs: Reviewed and stable  Complications: No apparent anesthesia complications

## 2013-11-02 ENCOUNTER — Encounter (HOSPITAL_BASED_OUTPATIENT_CLINIC_OR_DEPARTMENT_OTHER): Payer: Self-pay | Admitting: Orthopedic Surgery

## 2013-11-02 NOTE — Op Note (Signed)
[name], Julie [name] NO.:  [id number]  MEDICAL RECORD NO.:  [id number]  LOCATION:                                 FACILITY:  PHYSICIAN:  Cindee Salt, M.D.            DATE OF BIRTH:  DATE OF PROCEDURE:  11/01/2013 DATE OF DISCHARGE:                              OPERATIVE REPORT   PREOPERATIVE DIAGNOSIS:  Fracture attachment central slip, left small finger.  POSTOPERATIVE DIAGNOSIS:  Fracture attachment central slip, left small finger.  OPERATION:  Open reduction and internal fixation, repair of central slip of left small finger.  SURGEON:  Cindee Salt, MD  ASSISTANT:  Betha Loa MD  ANESTHESIA:  General with metacarpal block.  ANESTHESIOLOGIST:  Bedelia Person, MD  HISTORY:  The patient is a 79 year old female who suffered a fall with a fracture of her little finger and insertion of the central slip in a small fragment.  She is already showing a near deformity to the finger. She is admitted for repair of the central slip of her left small finger. She is aware of risks and complications including infection, recurrence, stiffness, recurrence of the deformity or arthritic changes, possibility of further surgery being necessary.  In the preoperative area, the patient is seen, the extremity marked by both patient and surgeon. Antibiotic given.  PROCEDURE IN DETAIL:  The patient was brought to the operating room where general anesthetic was carried out without difficulty.  She was prepped using ChloraPrep, supine position, left arm free.  A 3-minute dry time was allowed.  Time-out taken confirming patient and procedure. The limb was exsanguinated with an Esmarch bandage.  Tourniquet placed on the upper arm was inflated to 250 mmHg.  Curvilinear incision was made over the proximal middle phalanx of the left small finger and carried down through subcutaneous tissue.  Bleeders were electrocauterized with bipolar.  The dissection carried down.  The central slip  injury was immediately apparent.  This was opened, cleared with a Beaver blade.  The drill holes were placed for re-insertion of the central tendon back into the fracture fragment.  A 4-0 Mersilene suture was then passed through this leaving the fracture fragment intact as an anchor proximally.  The finger was brought into full extension, pinned with a 0.028 K-wire, and a repair performed to the tendon.  X- rays confirmed full extension of the PIP joint in good position.  The pin was bent and cut short.  The wound was irrigated.  The skin closed with interrupted 5-0 Vicryl Rapide.  A metacarpal block given with 0.25% Marcaine without epinephrine, 6 mL was used.  Sterile compressive dressing and splint to the finger applied.  On deflation of the tourniquet, remaining fingers pinked.  She was taken to the recovery room for observation in satisfactory condition.  She will be discharged home to return in 1 week on Norco.          ______________________________ Cindee Salt, M.D.     GK/MEDQ  D:  11/01/2013  T:  11/02/2013  Job:  [id number]

## 2013-11-16 ENCOUNTER — Ambulatory Visit: Payer: Medicare Other | Admitting: Family Medicine

## 2013-11-18 ENCOUNTER — Ambulatory Visit: Payer: Medicare Other | Admitting: Family Medicine

## 2014-01-01 HISTORY — PX: FRACTURE SURGERY: SHX138

## 2014-03-01 HISTORY — PX: FRACTURE SURGERY: SHX138

## 2014-04-26 ENCOUNTER — Ambulatory Visit (INDEPENDENT_AMBULATORY_CARE_PROVIDER_SITE_OTHER): Payer: Medicare Other | Admitting: Family Medicine

## 2014-04-26 ENCOUNTER — Encounter: Payer: Self-pay | Admitting: Family Medicine

## 2014-04-26 VITALS — BP 128/72 | HR 66 | Temp 98.3°F | Ht 61.75 in | Wt 177.2 lb

## 2014-04-26 DIAGNOSIS — Z9181 History of falling: Secondary | ICD-10-CM | POA: Insufficient documentation

## 2014-04-26 DIAGNOSIS — R531 Weakness: Secondary | ICD-10-CM | POA: Insufficient documentation

## 2014-04-26 DIAGNOSIS — R29818 Other symptoms and signs involving the nervous system: Secondary | ICD-10-CM

## 2014-04-26 DIAGNOSIS — R2689 Other abnormalities of gait and mobility: Secondary | ICD-10-CM | POA: Insufficient documentation

## 2014-04-26 DIAGNOSIS — M6281 Muscle weakness (generalized): Secondary | ICD-10-CM

## 2014-04-26 DIAGNOSIS — M1990 Unspecified osteoarthritis, unspecified site: Secondary | ICD-10-CM

## 2014-04-26 NOTE — Progress Notes (Signed)
Pre visit review using our clinic review tool, if applicable. No additional management support is needed unless otherwise documented below in the visit note. 

## 2014-04-26 NOTE — Progress Notes (Signed)
Subjective:    Patient ID: Julie [name], female    DOB: 01-19-1932, 80 y.o.   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)  Was told this could affect balance   No hearing impairment  No hx of vertigo or chronic dizziness  She thinks she has baseline L sided weakness from previous brain surgery (she does not see a neurologist regularly- is at [facility]) -has never seen a neurologist around here  No headaches except for sinus headaches  No numbness or neuropathy  Has chronic pain in L knee -needs a knee replacement  Also chronic back pain - is problematic (has had shots in her back)--she has never been told that she has foot drop  No confusion or memory problems   Medicine wise - she takes norco if when she goes to bed (from Dr Louanne Skye)  No longer taking prednisone -her PMR symptoms are better   She has railings on her steps  Commode is fixed with rails also /handicapped  Shower is fixed with railings as well  Her husband waits in bathroom when she takes a shower Has shower chair - but she cannot get up from it (or any chair) on her own  Has carpet-no rugs   Declines moving /declines assit living at this time  Husband helps  Other family is not local  Has a sister in [place]    She has not done PT to prevent falls - would be interested in that    Patient Active Problem List   Diagnosis Date Noted  . PMR (polymyalgia rheumatica) 10/11/2013  . Right wrist pain 09/21/2013  . Right hand pain 09/21/2013  . Injury of toe on left foot 09/06/2013  . Encounter for Medicare annual wellness exam 07/13/2013  . Risk for falls 07/13/2013  . Skin tag of labia 03/14/2013  . Back pain, thoracic 08/07/2011  . Weakness of left leg 07/30/2011  . BACK PAIN, LUMBAR 12/10/2010  . BREAST MASS, RIGHT 09/13/2010  . UNSPECIFIED VITAMIN D DEFICIENCY 08/16/2010  . GERD 03/26/2010  . DYSPEPSIA 03/26/2010  . IRRITABLE BOWEL SYNDROME 03/26/2010  . Osteoporosis, unspecified 08/27/2009  . HYPERLIPIDEMIA 07/24/2009  . DIVERTICULOSIS, COLON 07/24/2009  . OSTEOARTHRITIS 07/24/2009  . DIARRHEA, PERSISTENT 07/24/2009  . COLONIC POLYPS, HX OF 07/24/2009  . POSTMENOPAUSAL STATUS 07/24/2009   Past Medical History  Diagnosis Date  . GERD (gastroesophageal reflux disease)   . Esophagitis   . Duodenitis   . Chronic leg pain   . Trochanteric bursitis   . OA (osteoarthritis) of knee   . TMJ syndrome   . Seborrheic dermatitis   . HLD (hyperlipidemia)   . Colon polyp   . Diverticulosis of colon     internal----Dr. Vira Agar  . Hemorrhoids   .  Adenoma   . IBS (irritable bowel syndrome)     with PP diarrhea  . Scoliosis   . Osteopenia   . DDD (degenerative disc disease), cervical    Past Surgical History  Procedure Laterality Date  . Total abdominal hysterectomy    . Hemorrhoid surgery    . Foot surgery  2010    bilateral hammer toes and "knot"  . Colonoscopy  1/11    polyps-hyperplastic and adenomatous  . Carpal tunnel release Left 01/18/2013    Procedure: CARPAL TUNNEL RELEASE;  Surgeon: Wynonia Sours, MD;  Location: [place];  Service: Orthopedics;  Laterality: Left;  OSTEOTOMY LEFT DISTAL RADIUS BONE CHIPS CARPAL TUNNEL RELEASE LEFT   . Wrist osteotomy Left 01/18/2013    Procedure: WRIST OSTEOTOMY;  Surgeon: Wynonia Sours, MD;  Location: [place];   Service: Orthopedics;  Laterality: Left;  . Cerebral aneurysm repair  2001  . Open reduction internal fixation (orif) finger with radial bone graft Left 11/01/2013    Procedure: OPEN REDUCTION INTERNAL FIXATION (ORIF) LEFT SMALL FINGER;  Surgeon: Wynonia Sours, MD;  Location: [place];  Service: Orthopedics;  Laterality: Left;   History  Substance Use Topics  . Smoking status: Never Smoker   . Smokeless tobacco: Not on file  . Alcohol Use: No   Family History  Problem Relation Age of Onset  . Colon cancer Mother   . Osteoporosis      hip fracture  . Stroke Brother   . Hypertension Brother   . Other Brother     Heart problem  . Colon cancer Brother   . Colon cancer Maternal Aunt    Allergies  Allergen Reactions  . Codeine     REACTION: headache and nausea and vomiting   Current Outpatient Prescriptions on File Prior to Visit  Medication Sig Dispense Refill  . acetaminophen (TYLENOL) 500 MG tablet Take 500 mg by mouth every 6 (six) hours as needed.        . ergocalciferol (VITAMIN D2) 50000 UNITS capsule Take 1 capsule (50,000 Units total) by mouth once a week.  12 capsule  0  . famotidine (PEPCID AC) 10 MG chewable tablet Chew 10 mg by mouth daily as needed.        . Glucosamine Sulfate 1000 MG CAPS Take 2,000 mg by mouth daily.      Marland Kitchen HYDROcodone-acetaminophen (NORCO) 5-325 MG per tablet Take 1 tablet by mouth every 6 (six) hours as needed for moderate pain.  30 tablet  0  . HYDROcodone-acetaminophen (NORCO/VICODIN) 5-325 MG per tablet Take 1 tablet by mouth every 6 (six) hours as needed for moderate pain.      . predniSONE (DELTASONE) 20 MG tablet Take 1 tablet (20 mg total) by mouth daily with breakfast.  30 tablet  1   No current facility-administered medications on file prior to visit.    Review of Systems Review of Systems  Constitutional: Negative for fever, appetite change,  and unexpected weight change. pos for easy fatigability ENT neg for sinus pain  or ear pain  Eyes: Negative for pain and pos for  visual disturbance.  Respiratory: Negative for cough and shortness of breath.   Cardiovascular: Negative for cp or palpitations    Gastrointestinal: Negative for nausea, diarrhea and constipation.  Genitourinary: Negative for urgency and frequency. pos for urge incontinence  Skin: Negative for pallor or rash   Neurological: Negative for  light-headedness, numbness and headaches. pos for  baseline L sided weakness MSK pos for L knee pain and limited rom  Hematological: Negative for adenopathy. Does not bruise/bleed easily.  Psychiatric/Behavioral: Negative for dysphoric mood. The patient is  nervous/anxious.         Objective:   Physical Exam  Constitutional: She appears well-developed and well-nourished. No distress.  Frail appearing elderly female   HENT:  Head: Normocephalic and atraumatic.  Mouth/Throat: Oropharynx is clear and moist.  Eyes: Conjunctivae and EOM are normal. Pupils are equal, round, and reactive to light. Right eye exhibits no discharge. Left eye exhibits no discharge. No scleral icterus.  Neck: Normal range of motion. Neck supple. No JVD present. Carotid bruit is not present. No thyromegaly present.  Cardiovascular: Normal rate, regular rhythm and intact distal pulses.  Exam reveals no gallop.   Pulmonary/Chest: Effort normal and breath sounds normal. No respiratory distress. She has no wheezes. She has no rales.  Abdominal: She exhibits no abdominal bruit.  Musculoskeletal: She exhibits no edema and no tenderness.  No myofascial tenderness  Limited rom L knee   Lymphadenopathy:    She has no cervical adenopathy.  Neurological: She is alert. She has normal reflexes. She displays no atrophy. No cranial nerve deficit or sensory deficit. She exhibits normal muscle tone. Gait abnormal. Coordination normal.  Shuffling gait with sliding of L foot and wide base  4/5 strength L foot and leg  Gets along well with wheeled  walker (with seat and brakes)   Skin: Skin is warm and dry. No rash noted. No erythema. No pallor.  Psychiatric: She has a normal mood and affect.          Assessment & Plan:

## 2014-04-26 NOTE — Patient Instructions (Addendum)
Get a smaller table for your kitchen as soon as possible - so you can use your walker in ALL rooms of the house  Have husband help you out of the shower chair (use it) Discuss the norco medicine with Dr Louanne Skye - in light of fall risk with it  Use the knee brace and consider knee surgery if needed  Consider getting a medi alert necklace  Stop at check out for referral to PT for balance/fall prevention and strengthening  If no improvement we may want to send you to neurology     Fall Prevention and Home Safety Falls cause injuries and can affect all age groups. It is possible to use preventive measures to significantly decrease the likelihood of falls. There are many simple measures which can make your home safer and prevent falls. OUTDOORS  Repair cracks and edges of walkways and driveways.  Remove high doorway thresholds.  Trim shrubbery on the main path into your home.  Have good outside lighting.  Clear walkways of tools, rocks, debris, and clutter.  Check that handrails are not broken and are securely fastened. Both sides of steps should have handrails.  Have leaves, snow, and ice cleared regularly.  Use sand or salt on walkways during winter months.  In the garage, clean up grease or oil spills. BATHROOM  Install night lights.  Install grab bars by the toilet and in the tub and shower.  Use non-skid mats or decals in the tub or shower.  Place a plastic non-slip stool in the shower to sit on, if needed.  Keep floors dry and clean up all water on the floor immediately.  Remove soap buildup in the tub or shower on a regular basis.  Secure bath mats with non-slip, double-sided rug tape.  Remove throw rugs and tripping hazards from the floors. BEDROOMS  Install night lights.  Make sure a bedside light is easy to reach.  Do not use oversized bedding.  Keep a telephone by your bedside.  Have a firm chair with side arms to use for getting dressed.  Remove throw  rugs and tripping hazards from the floor. KITCHEN  Keep handles on pots and pans turned toward the center of the stove. Use back burners when possible.  Clean up spills quickly and allow time for drying.  Avoid walking on wet floors.  Avoid hot utensils and knives.  Position shelves so they are not too high or low.  Place commonly used objects within easy reach.  If necessary, use a sturdy step stool with a grab bar when reaching.  Keep electrical cables out of the way.  Do not use floor polish or wax that makes floors slippery. If you must use wax, use non-skid floor wax.  Remove throw rugs and tripping hazards from the floor. STAIRWAYS  Never leave objects on stairs.  Place handrails on both sides of stairways and use them. Fix any loose handrails. Make sure handrails on both sides of the stairways are as long as the stairs.  Check carpeting to make sure it is firmly attached along stairs. Make repairs to worn or loose carpet promptly.  Avoid placing throw rugs at the top or bottom of stairways, or properly secure the rug with carpet tape to prevent slippage. Get rid of throw rugs, if possible.  Have an electrician put in a light switch at the top and bottom of the stairs. OTHER FALL PREVENTION TIPS  Wear low-heel or rubber-soled shoes that are supportive and fit well.  Wear closed toe shoes.  When using a stepladder, make sure it is fully opened and both spreaders are firmly locked. Do not climb a closed stepladder.  Add color or contrast paint or tape to grab bars and handrails in your home. Place contrasting color strips on first and last steps.  Learn and use mobility aids as needed. Install an electrical emergency response system.  Turn on lights to avoid dark areas. Replace light bulbs that burn out immediately. Get light switches that glow.  Arrange furniture to create clear pathways. Keep furniture in the same place.  Firmly attach carpet with non-skid or  double-sided tape.  Eliminate uneven floor surfaces.  Select a carpet pattern that does not visually hide the edge of steps.  Be aware of all pets. OTHER HOME SAFETY TIPS  Set the water temperature for 120 F (48.8 C).  Keep emergency numbers on or near the telephone.  Keep smoke detectors on every level of the home and near sleeping areas. Document Released: 11/07/2002 Document Revised: 05/18/2012 Document Reviewed: 02/06/2012 [facility] Patient Information 2014 [place].

## 2014-04-27 NOTE — Assessment & Plan Note (Signed)
Per pt since her aneurysm surgery  No doubt inc her fall risk Ref to PT

## 2014-04-27 NOTE — Assessment & Plan Note (Signed)
Pt will try a brace Needs knee repl This also inc fall risk

## 2014-04-27 NOTE — Assessment & Plan Note (Signed)
Multifactorial and age rel with many falls Ref to PT for vestib training and to inc strength/stamina

## 2014-04-27 NOTE — Assessment & Plan Note (Addendum)
Multifactorial- age /habitus/chronic pain/ OA with limited rom knee/baseline L sided weakness  Ref to PT for balance and weakness  Use rolling walker when able  Use light weight aluminum walker in house and remove table from kitchen so she can also use it there  Disc rails in home -see handout re: fall prev  If no imp -consider neuro consult

## 2014-05-09 ENCOUNTER — Encounter: Payer: Self-pay | Admitting: Family Medicine

## 2014-05-31 ENCOUNTER — Encounter: Payer: Self-pay | Admitting: Family Medicine

## 2014-07-01 ENCOUNTER — Encounter: Payer: Self-pay | Admitting: Family Medicine

## 2014-09-08 ENCOUNTER — Ambulatory Visit (INDEPENDENT_AMBULATORY_CARE_PROVIDER_SITE_OTHER): Payer: Medicare Other | Admitting: Family Medicine

## 2014-09-08 VITALS — BP 134/84 | HR 72 | Ht 63.0 in | Wt 175.0 lb

## 2014-09-08 DIAGNOSIS — M25562 Pain in left knee: Secondary | ICD-10-CM

## 2014-09-08 DIAGNOSIS — M1991 Primary osteoarthritis, unspecified site: Secondary | ICD-10-CM

## 2014-09-08 DIAGNOSIS — M1990 Unspecified osteoarthritis, unspecified site: Secondary | ICD-10-CM

## 2014-09-08 MED ORDER — DICLOFENAC SODIUM 2 % TD SOLN: TRANSDERMAL | Status: DC

## 2014-09-08 NOTE — Patient Instructions (Signed)
Good to see you.  xrays downstairs today.  Ice 20 minutes 2 times daily. Usually after activity and before bed. Exercises 3 times a week. Alternate the knee and hamstring.  Pennsaid twice daily as neede to the knee Wear brace daily Take tylenol 500 mg three times a day is the best evidence based medicine we have for arthritis. DO NOT TAKE THOUGH IF YOU TOOK YOUR PAIN MEDICINE  Vitamin D 2000 IU daily Tumeric 500mg  twice daily.  Capsaicin topically up to four times a day may also help with pain. Cortisone injections are an option if these interventions do not seem to make a difference or need more relief.  If cortisone injections do not help, there are different types of shots that may help but they take longer to take effect.  We can discuss this at follow up.  It's important that you continue to stay active. Shoe inserts with good arch support may be helpful.  Spenco orthotics at [employer] could help.  Water aerobics and cycling with low resistance are the best two types of exercise for arthritis. Come back and see me in 3 weeks.

## 2014-09-08 NOTE — Progress Notes (Signed)
[facility] [address], [place] [address] Phone: [phone] Subjective:    I'm seeing this patient by the request  of:  [name], MD   CC: Left knee pain GLO:[id number] Julie [name] is a 80 y.o. female coming in with complaint of left knee pain. Patient does have a past medical history significant for left-sided weakness after surgery for an aneurysm. Patient also has a history of polymyalgia rheumatica. Patient notes that she has osteoarthritic changes in multiple joints unfortunately feels like this could be the same problem in her knee. Patient states that it is becoming more difficult to ambulate. Patient states that the severity of pain seems to be increasing in mostly over the medial aspect of the knee. Patient does have poor balance. Patient has been ambulating with the aid of a cane from time to time secondary to this poor balance. Patient states that the pain in the left knee seems to be worse and starting to give her difficulty with her regular daily activities. Patient has tried some home remedies without any significant improvement. States that the severity is 7/10 and can wake her up at night. Patient has seen another provider for the knee before and has been told that she has severe arthritis in the discussed the possibility of surgical intervention which patient declined. Patient would like to avoid it significantly.     Past medical history, social, surgical and family history all reviewed in electronic medical record.   Review of Systems: No headache, visual changes, nausea, vomiting, diarrhea, constipation, dizziness, abdominal pain, skin rash, fevers, chills, night sweats, weight loss, swollen lymph nodes, body aches, joint swelling, muscle aches, chest pain, shortness of breath, mood changes.   Objective Blood pressure 134/84, pulse 72, height 5\' 3"  (1.6 m), weight 175 lb (79.379 kg), SpO2 96.00%.  General: No apparent distress  alert and oriented x3 mood and affect normal, dressed appropriately.  HEENT: Pupils equal, extraocular movements intact  Respiratory: Patient's speak in full sentences and does not appear short of breath  Cardiovascular: +1 lower extremity edema, non tender, no erythema  Skin: Warm dry intact with no signs of infection or rash on extremities or on axial skeleton.  Abdomen: Soft nontender  Neuro: Cranial nerves II through XII are intact, neurovascularly intact in all extremities with 2+ DTRs and 2+ pulses.  Lymph: No lymphadenopathy of posterior or anterior cervical chain or axillae bilaterally.  Gait normal with good balance and coordination.  MSK:  Mildly tender with near full range of motion and good stability and symmetric strength and tone of shoulders, elbows, wrist, hip,  and ankles bilaterally. Multiple joints have osteophytic changes Knee: Left Normal to inspection with no erythema or effusion or obvious bony abnormalities. Lipoma noted over the medial aspect of the knee Patient is tender to palpation mostly over the medial joint line as well as posterior aspect of the hamstring. ROM full in flexion and extension and lower leg rotation. Ligaments with solid consistent endpoints including ACL, PCL, LCL, MCL. Negative Mcmurray's, Apley's, and Thessalonian tests. Mild painful patellar compression. Patellar glide with mild crepitus. Patellar and quadriceps tendons unremarkable.  Contralateral knee minimal crepitus but full range of motion in minimally tender to palpation.  After informed written and verbal consent, patient was seated on exam table. Left knee was prepped with alcohol swab and utilizing anterolateral approach, patient's left knee space was injected with 4:1  marcaine 0.5%: Kenalog 40mg /dL. Patient tolerated the procedure well without  immediate complications.   Impression and Recommendations:     This case required medical decision making of moderate complexity.

## 2014-09-09 ENCOUNTER — Encounter: Payer: Self-pay | Admitting: Family Medicine

## 2014-09-09 DIAGNOSIS — M1991 Primary osteoarthritis, unspecified site: Secondary | ICD-10-CM | POA: Insufficient documentation

## 2014-09-09 NOTE — Assessment & Plan Note (Signed)
The patient was given an injection today. Patient does have pain medication from another provider and can continue this point we also discussed the possibility of scheduling Tylenol. We discussed not taking the Tylenol at the same time of the pain medication to. We also discussed other over-the-counter medications that can be helpful. Patient was given a trial of anti-inflammatories topically that could be beneficial. We discussed an icing protocol. We discussed range of motion exercises at this time. I would like to get x-rays to further evaluate the amount of arthritis but I do think that this be severe to end-stage. Discussed the possibility of formal physical therapy for ambulation as well as balance as well with patient we'll consider, patient will come back and see me again in 3-4 weeks for further evaluation. Depending on results patient may be a candidate for viscous supplementation.

## 2014-09-15 ENCOUNTER — Other Ambulatory Visit (INDEPENDENT_AMBULATORY_CARE_PROVIDER_SITE_OTHER): Payer: Medicare Other

## 2014-09-15 ENCOUNTER — Telehealth: Payer: Self-pay | Admitting: Family Medicine

## 2014-09-15 DIAGNOSIS — M810 Age-related osteoporosis without current pathological fracture: Secondary | ICD-10-CM

## 2014-09-15 DIAGNOSIS — Z0000 Encounter for general adult medical examination without abnormal findings: Secondary | ICD-10-CM

## 2014-09-15 DIAGNOSIS — E559 Vitamin D deficiency, unspecified: Secondary | ICD-10-CM

## 2014-09-15 DIAGNOSIS — E785 Hyperlipidemia, unspecified: Secondary | ICD-10-CM

## 2014-09-15 DIAGNOSIS — Z9181 History of falling: Secondary | ICD-10-CM

## 2014-09-15 LAB — COMPREHENSIVE METABOLIC PANEL
ALT: 11 U/L (ref 0–35)
AST: 14 U/L (ref 0–37)
Albumin: 3.2 g/dL — ABNORMAL LOW (ref 3.5–5.2)
Alkaline Phosphatase: 77 U/L (ref 39–117)
BUN: 17 mg/dL (ref 6–23)
CO2: 23 mEq/L (ref 19–32)
Calcium: 9.1 mg/dL (ref 8.4–10.5)
Chloride: 103 mEq/L (ref 96–112)
Creatinine, Ser: 1 mg/dL (ref 0.4–1.2)
GFR: 57.92 mL/min — ABNORMAL LOW (ref >60.00)
Glucose, Bld: 84 mg/dL (ref 70–99)
Potassium: 3.9 mEq/L (ref 3.5–5.1)
Sodium: 135 mEq/L (ref 135–145)
Total Bilirubin: 0.7 mg/dL (ref 0.2–1.2)
Total Protein: 7.1 g/dL (ref 6.0–8.3)

## 2014-09-15 LAB — CBC WITH DIFFERENTIAL/PLATELET
Basophils Absolute: 0 K/uL (ref 0.0–0.1)
Basophils Relative: 0.3 % (ref 0.0–3.0)
Eosinophils Absolute: 0.1 K/uL (ref 0.0–0.7)
Eosinophils Relative: 1.2 % (ref 0.0–5.0)
HCT: 38 % (ref 36.0–46.0)
Hemoglobin: 12.5 g/dL (ref 12.0–15.0)
Lymphocytes Relative: 17.5 % (ref 12.0–46.0)
Lymphs Abs: 1.5 K/uL (ref 0.7–4.0)
MCHC: 32.8 g/dL (ref 30.0–36.0)
MCV: 82.5 fl (ref 78.0–100.0)
Monocytes Absolute: 0.4 K/uL (ref 0.1–1.0)
Monocytes Relative: 5.1 % (ref 3.0–12.0)
Neutro Abs: 6.7 K/uL (ref 1.4–7.7)
Neutrophils Relative %: 75.9 % (ref 43.0–77.0)
Platelets: 290 K/uL (ref 150.0–400.0)
RBC: 4.6 Mil/uL (ref 3.87–5.11)
RDW: 14.3 % (ref 11.5–15.5)
WBC: 8.8 K/uL (ref 4.0–10.5)

## 2014-09-15 LAB — LIPID PANEL
Cholesterol: 205 mg/dL — ABNORMAL HIGH (ref 0–200)
HDL: 35.2 mg/dL — ABNORMAL LOW (ref >39.00)
NonHDL: 169.8
Total CHOL/HDL Ratio: 6
Triglycerides: 214 mg/dL — ABNORMAL HIGH (ref 0.0–149.0)
VLDL: 42.8 mg/dL — ABNORMAL HIGH (ref 0.0–40.0)

## 2014-09-15 LAB — TSH: TSH: 0.97 u[IU]/mL (ref 0.35–4.50)

## 2014-09-15 LAB — LDL CHOLESTEROL, DIRECT: Direct LDL: 123.4 mg/dL

## 2014-09-15 LAB — VITAMIN D 25 HYDROXY (VIT D DEFICIENCY, FRACTURES): VITD: 48.39 ng/mL (ref 30.00–100.00)

## 2014-09-15 NOTE — Telephone Encounter (Signed)
Message copied by Abner Greenspan on Fri Sep 15, 2014  7:43 AM ------      Message from: Ellamae Sia      Created: Fri Sep 08, 2014 11:49 AM      Regarding: Lab orders for Friday, 10.16.15       Patient is scheduled for CPX labs, please order future labs, Thanks , Terri       ------

## 2014-09-22 ENCOUNTER — Ambulatory Visit (INDEPENDENT_AMBULATORY_CARE_PROVIDER_SITE_OTHER): Payer: Medicare Other | Admitting: Family Medicine

## 2014-09-22 ENCOUNTER — Encounter: Payer: Self-pay | Admitting: Family Medicine

## 2014-09-22 VITALS — BP 128/74 | HR 76 | Temp 98.7°F | Ht 61.0 in | Wt 179.0 lb

## 2014-09-22 DIAGNOSIS — Z9181 History of falling: Secondary | ICD-10-CM

## 2014-09-22 DIAGNOSIS — Z1211 Encounter for screening for malignant neoplasm of colon: Secondary | ICD-10-CM

## 2014-09-22 DIAGNOSIS — Z23 Encounter for immunization: Secondary | ICD-10-CM

## 2014-09-22 DIAGNOSIS — M810 Age-related osteoporosis without current pathological fracture: Secondary | ICD-10-CM

## 2014-09-22 DIAGNOSIS — Z0000 Encounter for general adult medical examination without abnormal findings: Secondary | ICD-10-CM

## 2014-09-22 DIAGNOSIS — E785 Hyperlipidemia, unspecified: Secondary | ICD-10-CM

## 2014-09-22 DIAGNOSIS — E559 Vitamin D deficiency, unspecified: Secondary | ICD-10-CM

## 2014-09-22 NOTE — Progress Notes (Signed)
Subjective:    Patient ID: Julie [name], female    DOB: 1932/05/20, 80 y.o.   MRN: [id number]  HPI I have personally reviewed the Medicare Annual Wellness questionnaire and have noted 1. The patient's medical and social history 2. Their use of alcohol, tobacco or illicit drugs 3. Their current medications and supplements 4. The patient's functional ability including ADL's, fall risks, home safety risks and hearing or visual             impairment. 5. Diet and physical activities 6. Evidence for depression or mood disorders  The patients weight, height, BMI have been recorded in the chart and visual acuity is per eye clinic.  I have made referrals, counseling and provided education to the patient based review of the above and I have provided the pt with a written personalized care plan for preventive services.  Doing about the same  Working on knee problems -seeing Karlye Ihrig at [place] - giving her pain med  She wants to avoid a knee replacement  Using a walker  Doing exercises and wears a knee brace    See scanned forms.  Routine anticipatory guidance given to patient.  See health maintenance. Colon cancer screening colonosc 1/11 (polyp) - and def recall due to age  Breast cancer screening 8/14 - and needs to schedule  Self breast exam no lumps or changes  Flu vaccine 10/15  Tetanus vaccine 6/06  Pneumovax 8/14  Wants to get prevnar today Zoster vaccine 10/14   Advance directive- has that written up  Cognitive function addressed- see scanned forms- and if abnormal then additional documentation follows. No major problems -not concerned   Osteoporosis dexa 8/14  Takes ca and D Has had fracture in the past  D level is 48 No recent fractures   Had one fall since last visit - walked out the front door and fell backwards  Will use railings from now on  Otherwise uses her walker   Hyperlipidemia Lab Results  Component Value Date   CHOL 205* 09/15/2014   CHOL 211*  07/06/2013   CHOL 228* 08/13/2010   Lab Results  Component Value Date   HDL 35.20* 09/15/2014   HDL 47.20 07/06/2013   HDL 40.70 08/13/2010   No results found for this basename: LDLCALC   Lab Results  Component Value Date   TRIG 214.0* 09/15/2014   TRIG 197.0* 07/06/2013   TRIG 268.0* 08/13/2010   Lab Results  Component Value Date   CHOLHDL 6 09/15/2014   CHOLHDL 4 07/06/2013   CHOLHDL 6 08/13/2010   Lab Results  Component Value Date   LDLDIRECT 123.4 09/15/2014   LDLDIRECT 134.7 07/06/2013   LDLDIRECT 138.6 08/13/2010   HDL went down - she has been less active with knee problems  LDL came down a bit  Diet is not optimal (she tries to cut back but she has an addiction to sweets)     PMH and SH reviewed  Meds, vitals, and allergies reviewed.   ROS: See HPI.  Otherwise negative.     Wt is up 4 lb with bmi od 33  OP- dexa 8/14 D level is 48  Patient Active Problem List   Diagnosis Date Noted  . Colon cancer screening 09/22/2014  . Osteoarthritis of left lower extremity 09/09/2014  . History of falling 04/26/2014  . Poor balance 04/26/2014  . Left-sided weakness 04/26/2014  . PMR (polymyalgia rheumatica) 10/11/2013  . Encounter for Medicare annual wellness exam 07/13/2013  .  Risk for falls 07/13/2013  . Skin tag of labia 03/14/2013  . Back pain, thoracic 08/07/2011  . Weakness of left leg 07/30/2011  . BACK PAIN, LUMBAR 12/10/2010  . BREAST MASS, RIGHT 09/13/2010  . Vitamin D deficiency 08/16/2010  . GERD 03/26/2010  . DYSPEPSIA 03/26/2010  . IRRITABLE BOWEL SYNDROME 03/26/2010  . Osteoporosis 08/27/2009  . Hyperlipidemia 07/24/2009  . DIVERTICULOSIS, COLON 07/24/2009  . OSTEOARTHRITIS 07/24/2009  . DIARRHEA, PERSISTENT 07/24/2009  . COLONIC POLYPS, HX OF 07/24/2009  . POSTMENOPAUSAL STATUS 07/24/2009   Past Medical History  Diagnosis Date  . GERD (gastroesophageal reflux disease)   . Esophagitis   . Duodenitis   . Chronic leg pain   . Trochanteric bursitis    . OA (osteoarthritis) of knee   . TMJ syndrome   . Seborrheic dermatitis   . HLD (hyperlipidemia)   . Colon polyp   . Diverticulosis of colon     internal----Dr. Vira Agar  . Hemorrhoids   . Adenoma   . IBS (irritable bowel syndrome)     with PP diarrhea  . Scoliosis   . Osteopenia   . DDD (degenerative disc disease), cervical    Past Surgical History  Procedure Laterality Date  . Total abdominal hysterectomy    . Hemorrhoid surgery    . Foot surgery  2010    bilateral hammer toes and "knot"  . Colonoscopy  1/11    polyps-hyperplastic and adenomatous  . Carpal tunnel release Left 01/18/2013    Procedure: CARPAL TUNNEL RELEASE;  Surgeon: Wynonia Sours, MD;  Location: [facility];  Service: Orthopedics;  Laterality: Left;  OSTEOTOMY LEFT DISTAL RADIUS BONE CHIPS CARPAL TUNNEL RELEASE LEFT   . Wrist osteotomy Left 01/18/2013    Procedure: WRIST OSTEOTOMY;  Surgeon: Wynonia Sours, MD;  Location: [facility];  Service: Orthopedics;  Laterality: Left;  . Cerebral aneurysm repair  2001  . Open reduction internal fixation (orif) finger with radial bone graft Left 11/01/2013    Procedure: OPEN REDUCTION INTERNAL FIXATION (ORIF) LEFT SMALL FINGER;  Surgeon: Wynonia Sours, MD;  Location: [facility];  Service: Orthopedics;  Laterality: Left;   History  Substance Use Topics  . Smoking status: Never Smoker   . Smokeless tobacco: Not on file  . Alcohol Use: No   Family History  Problem Relation Age of Onset  . Colon cancer Mother   . Osteoporosis      hip fracture  . Stroke Brother   . Hypertension Brother   . Other Brother     Heart problem  . Colon cancer Brother   . Colon cancer Maternal Aunt    Allergies  Allergen Reactions  . Codeine     REACTION: headache and nausea and vomiting   Current Outpatient Prescriptions on File Prior to Visit  Medication Sig Dispense Refill  . acetaminophen (TYLENOL) 500 MG tablet Take 500 mg by mouth  every 6 (six) hours as needed.        . Diclofenac Sodium 2 % SOLN Apply twice daily.  112 g  3  . diphenhydramine-acetaminophen (TYLENOL PM) 25-500 MG TABS Take 1 tablet by mouth at bedtime as needed.      . famotidine (PEPCID AC) 10 MG chewable tablet Chew 10 mg by mouth daily as needed.        . Glucosamine Sulfate 1000 MG CAPS Take 2,000 mg by mouth daily.      Marland Kitchen HYDROcodone-acetaminophen (NORCO) 5-325 MG per tablet  Take 1 tablet by mouth every 6 (six) hours as needed for moderate pain.  30 tablet  0  . HYDROcodone-acetaminophen (NORCO/VICODIN) 5-325 MG per tablet Take 1 tablet by mouth every 6 (six) hours as needed for moderate pain.       No current facility-administered medications on file prior to visit.     Review of Systems Review of Systems  Constitutional: Negative for fever, appetite change, fatigue and unexpected weight change.  Eyes: Negative for pain and visual disturbance.  Respiratory: Negative for cough and shortness of breath.   Cardiovascular: Negative for cp or palpitations    Gastrointestinal: Negative for nausea, diarrhea and constipation.  Genitourinary: Negative for urgency and frequency.  Skin: Negative for pallor or rash   MSK pos for ongoing knee pain  Neurological: Negative for weakness, light-headedness, numbness and headaches. pos for poor balance and hx of falls  Hematological: Negative for adenopathy. Does not bruise/bleed easily.  Psychiatric/Behavioral: Negative for dysphoric mood. The patient is not nervous/anxious.         Objective:   Physical Exam  Constitutional: She appears well-developed and well-nourished. No distress.  obese and well appearing   HENT:  Head: Normocephalic and atraumatic.  Right Ear: External ear normal.  Left Ear: External ear normal.  Nose: Nose normal.  Mouth/Throat: Oropharynx is clear and moist.  Eyes: Conjunctivae and EOM are normal. Pupils are equal, round, and reactive to light. Right eye exhibits no discharge.  Left eye exhibits no discharge. No scleral icterus.  Neck: Normal range of motion. Neck supple. No JVD present. No thyromegaly present.  Cardiovascular: Normal rate, regular rhythm, normal heart sounds and intact distal pulses.  Exam reveals no gallop.   Pulmonary/Chest: Effort normal and breath sounds normal. No respiratory distress. She has no wheezes. She has no rales.  Abdominal: Soft. Bowel sounds are normal. She exhibits no distension and no mass. There is no tenderness.  Musculoskeletal: She exhibits no edema and no tenderness.  Pt uses walker for ambulation - walks steadily with it   Has brace on L knee   Cannot get on table due to knee pain today   Lymphadenopathy:    She has no cervical adenopathy.  Neurological: She is alert. She has normal reflexes. No cranial nerve deficit. She exhibits normal muscle tone. Coordination normal.  Skin: Skin is warm and dry. No rash noted. No erythema. No pallor.  Psychiatric: She has a normal mood and affect.          Assessment & Plan:   Problem List Items Addressed This Visit     Musculoskeletal and Integument   Osteoporosis     dexa 8/14 No fractures Disc need for calcium/ vitamin D/ wt bearing exercise and bone density test every 2 y to monitor Disc safety/ fracture risk in detail   D level is therapeutic       Other   Vitamin D deficiency     Vitamin D level is therapeutic with current supplementation Disc importance of this to bone and overall health     Hyperlipidemia     Disc goals for lipids and reasons to control them Rev labs with pt Rev low sat fat diet in detail HDL down-disc inc in activity to improve this - would consider water exercise Low sat fat diet for HDL    Encounter for Medicare annual wellness exam - Primary     Reviewed health habits including diet and exercise and skin cancer prevention Reviewed appropriate screening tests  for age  Also reviewed health mt list, fam hx and immunization status , as  well as social and family history   See HPI imms updated  Pt will schedule  Labs rev in detail      Risk for falls     Rev this - rev safety in home  Will no longer use stairs w/o rails  Feels she is safe  Has walker to use at all times      Colon cancer screening     Given age - IFOB stool card given-would consider colonosc if she wanted one if if IFOB was pos     Relevant Orders      Fecal occult blood, imunochemical    Other Visit Diagnoses   Need for vaccination with 13-polyvalent pneumococcal conjugate vaccine        Relevant Orders       Pneumococcal conjugate vaccine 13-valent (Completed)

## 2014-09-22 NOTE — Progress Notes (Signed)
Pre visit review using our clinic review tool, if applicable. No additional management support is needed unless otherwise documented below in the visit note. 

## 2014-09-22 NOTE — Patient Instructions (Signed)
Please do stool card for colon cancer screening  prevnar vaccine today  Don't forget to schedule your mammogram  Watch diet for cholesterol  (Avoid red meat/ fried foods/ egg yolks/ fatty breakfast meats/ butter, cheese and high fat dairy/ and shellfish )    Fat and Cholesterol Control Diet Fat and cholesterol levels in your blood and organs are influenced by your diet. High levels of fat and cholesterol may lead to diseases of the heart, small and large blood vessels, gallbladder, liver, and pancreas. CONTROLLING FAT AND CHOLESTEROL WITH DIET Although exercise and lifestyle factors are important, your diet is key. That is because certain foods are known to raise cholesterol and others to lower it. The goal is to balance foods for their effect on cholesterol and more importantly, to replace saturated and trans fat with other types of fat, such as monounsaturated fat, polyunsaturated fat, and omega-3 fatty acids. On average, a person should consume no more than 15 to 17 g of saturated fat daily. Saturated and trans fats are considered "bad" fats, and they will raise LDL cholesterol. Saturated fats are primarily found in animal products such as meats, butter, and cream. However, that does not mean you need to give up all your favorite foods. Today, there are good tasting, low-fat, low-cholesterol substitutes for most of the things you like to eat. Choose low-fat or nonfat alternatives. Choose round or loin cuts of red meat. These types of cuts are lowest in fat and cholesterol. Chicken (without the skin), fish, veal, and ground [place] breast are great choices. Eliminate fatty meats, such as hot dogs and salami. Even shellfish have little or no saturated fat. Have a 3 oz (85 g) portion when you eat lean meat, poultry, or fish. Trans fats are also called "partially hydrogenated oils." They are oils that have been scientifically manipulated so that they are solid at room temperature resulting in a longer  shelf life and improved taste and texture of foods in which they are added. Trans fats are found in stick margarine, some tub margarines, cookies, crackers, and baked goods.  When baking and cooking, oils are a great substitute for butter. The monounsaturated oils are especially beneficial since it is believed they lower LDL and raise HDL. The oils you should avoid entirely are saturated tropical oils, such as coconut and palm.  Remember to eat a lot from food groups that are naturally free of saturated and trans fat, including fish, fruit, vegetables, beans, grains (barley, rice, couscous, bulgur wheat), and pasta (without cream sauces).  IDENTIFYING FOODS THAT LOWER FAT AND CHOLESTEROL  Soluble fiber may lower your cholesterol. This type of fiber is found in fruits such as apples, vegetables such as broccoli, potatoes, and carrots, legumes such as beans, peas, and lentils, and grains such as barley. Foods fortified with plant sterols (phytosterol) may also lower cholesterol. You should eat at least 2 g per day of these foods for a cholesterol lowering effect.  Read package labels to identify low-saturated fats, trans fat free, and low-fat foods at the supermarket. Select cheeses that have only 2 to 3 g saturated fat per ounce. Use a heart-healthy tub margarine that is free of trans fats or partially hydrogenated oil. When buying baked goods (cookies, crackers), avoid partially hydrogenated oils. Breads and muffins should be made from whole grains (whole-wheat or whole oat flour, instead of "flour" or "enriched flour"). Buy non-creamy canned soups with reduced salt and no added fats.  FOOD PREPARATION TECHNIQUES  Never deep-fry. If  you must fry, either stir-fry, which uses very little fat, or use non-stick cooking sprays. When possible, broil, bake, or roast meats, and steam vegetables. Instead of putting butter or margarine on vegetables, use lemon and herbs, applesauce, and cinnamon (for squash and sweet  potatoes). Use nonfat yogurt, salsa, and low-fat dressings for salads.  LOW-SATURATED FAT / LOW-FAT FOOD SUBSTITUTES Meats / Saturated Fat (g)  Avoid: Steak, marbled (3 oz/85 g) / 11 g  Choose: Steak, lean (3 oz/85 g) / 4 g  Avoid: Hamburger (3 oz/85 g) / 7 g  Choose: Hamburger, lean (3 oz/85 g) / 5 g  Avoid: Ham (3 oz/85 g) / 6 g  Choose: Ham, lean cut (3 oz/85 g) / 2.4 g  Avoid: Chicken, with skin, dark meat (3 oz/85 g) / 4 g  Choose: Chicken, skin removed, dark meat (3 oz/85 g) / 2 g  Avoid: Chicken, with skin, light meat (3 oz/85 g) / 2.5 g  Choose: Chicken, skin removed, light meat (3 oz/85 g) / 1 g Dairy / Saturated Fat (g)  Avoid: Whole milk (1 cup) / 5 g  Choose: Low-fat milk, 2% (1 cup) / 3 g  Choose: Low-fat milk, 1% (1 cup) / 1.5 g  Choose: Skim milk (1 cup) / 0.3 g  Avoid: Hard cheese (1 oz/28 g) / 6 g  Choose: Skim milk cheese (1 oz/28 g) / 2 to 3 g  Avoid: Cottage cheese, 4% fat (1 cup) / 6.5 g  Choose: Low-fat cottage cheese, 1% fat (1 cup) / 1.5 g  Avoid: Ice cream (1 cup) / 9 g  Choose: Sherbet (1 cup) / 2.5 g  Choose: Nonfat frozen yogurt (1 cup) / 0.3 g  Choose: Frozen fruit bar / trace  Avoid: Whipped cream (1 tbs) / 3.5 g  Choose: Nondairy whipped topping (1 tbs) / 1 g Condiments / Saturated Fat (g)  Avoid: Mayonnaise (1 tbs) / 2 g  Choose: Low-fat mayonnaise (1 tbs) / 1 g  Avoid: Butter (1 tbs) / 7 g  Choose: Extra light margarine (1 tbs) / 1 g  Avoid: Coconut oil (1 tbs) / 11.8 g  Choose: Olive oil (1 tbs) / 1.8 g  Choose: Corn oil (1 tbs) / 1.7 g  Choose: Safflower oil (1 tbs) / 1.2 g  Choose: Sunflower oil (1 tbs) / 1.4 g  Choose: Soybean oil (1 tbs) / 2.4 g  Choose: Canola oil (1 tbs) / 1 g Document Released: 11/17/2005 Document Revised: 03/14/2013 Document Reviewed: 02/15/2014 Exit[employer] Patient Information 2015 [place] , [place]. This information is not intended to replace advice given to you by your health care  provider. Make sure you discuss any questions you have with your health care provider.

## 2014-09-24 NOTE — Assessment & Plan Note (Signed)
Disc goals for lipids and reasons to control them Rev labs with pt Rev low sat fat diet in detail HDL down-disc inc in activity to improve this - would consider water exercise Low sat fat diet for HDL

## 2014-09-24 NOTE — Assessment & Plan Note (Signed)
dexa 8/14 No fractures Disc need for calcium/ vitamin D/ wt bearing exercise and bone density test every 2 y to monitor Disc safety/ fracture risk in detail   D level is therapeutic

## 2014-09-24 NOTE — Assessment & Plan Note (Signed)
Rev this - rev safety in home  Will no longer use stairs w/o rails  Feels she is safe  Has walker to use at all times

## 2014-09-24 NOTE — Assessment & Plan Note (Signed)
Given age - IFOB stool card given-would consider colonosc if she wanted one if if IFOB was pos

## 2014-09-24 NOTE — Assessment & Plan Note (Addendum)
Reviewed health habits including diet and exercise and skin cancer prevention Reviewed appropriate screening tests for age  Also reviewed health mt list, fam hx and immunization status , as well as social and family history   See HPI imms updated  Pt will schedule  Labs rev in detail

## 2014-09-24 NOTE — Assessment & Plan Note (Signed)
Vitamin D level is therapeutic with current supplementation Disc importance of this to bone and overall health  

## 2014-09-29 ENCOUNTER — Ambulatory Visit (INDEPENDENT_AMBULATORY_CARE_PROVIDER_SITE_OTHER)
Admission: RE | Admit: 2014-09-29 | Discharge: 2014-09-29 | Disposition: A | Discharge status: Home / Self Care | Payer: Medicare Other | Source: Ambulatory Visit | Attending: Family Medicine | Admitting: Family Medicine

## 2014-09-29 ENCOUNTER — Encounter: Payer: Self-pay | Admitting: Family Medicine

## 2014-09-29 ENCOUNTER — Ambulatory Visit (INDEPENDENT_AMBULATORY_CARE_PROVIDER_SITE_OTHER): Payer: Medicare Other | Admitting: Family Medicine

## 2014-09-29 VITALS — BP 142/72 | HR 88 | Wt 180.0 lb

## 2014-09-29 DIAGNOSIS — M1990 Unspecified osteoarthritis, unspecified site: Secondary | ICD-10-CM

## 2014-09-29 DIAGNOSIS — M1991 Primary osteoarthritis, unspecified site: Secondary | ICD-10-CM

## 2014-09-29 NOTE — Progress Notes (Signed)
  [name] [address] [place], [place] [address] Phone: [phone] Subjective:     CC: Left knee pain follow-up Julie [name] is a 80 y.o. female coming in with complaint of left knee pain. Patient does have a past medical history significant for left-sided weakness after surgery for an aneurysm. Patient also has a history of polymyalgia rheumatica. Patient notes that she has osteoarthritic changes in multiple joints unfortunately feels like this could be the same problem in her knee.   Patient was seen previously for severe medial joint line tenderness of the left knee. Patient likely does have severe osteophytic changes but patient did not get x-rays that were ordered yet. Patient states though that the injection as well as the bracing has decreased the pain by approximately 50%. Patient has not been doing the icing and home exercises. Patient states that she can walk longer distances. Patient denies any worsening of any swelling. Patient states at the end of the day she does not have a throbbing sensation. Still though having some pain at all times.  Of note patient has had multiple different cortisone injections as well as a viscous supplementation done greater than one year ago.     Past medical history, social, surgical and family history all reviewed in electronic medical record.   Review of Systems: No headache, visual changes, nausea, vomiting, diarrhea, constipation, dizziness, abdominal pain, skin rash, fevers, chills, night sweats, weight loss, swollen lymph nodes, body aches, joint swelling, muscle aches, chest pain, shortness of breath, mood changes.   Objective Blood pressure 142/72, pulse 88, weight 180 lb (81.647 kg), SpO2 92.00%.  General: No apparent distress alert and oriented x3 mood and affect normal, dressed appropriately.  HEENT: Pupils equal, extraocular movements intact  Respiratory: Patient's speak in full  sentences and does not appear short of breath  Cardiovascular: +1 lower extremity edema, non tender, no erythema  Skin: Warm dry intact with no signs of infection or rash on extremities or on axial skeleton.  Abdomen: Soft nontender  Neuro: Cranial nerves II through XII are intact, neurovascularly intact in all extremities with 2+ DTRs and 2+ pulses.  Lymph: No lymphadenopathy of posterior or anterior cervical chain or axillae bilaterally.  Gait normal with good balance and coordination.  MSK:  Mildly tender with near full range of motion and good stability and symmetric strength and tone of shoulders, elbows, wrist, hip,  and ankles bilaterally. Multiple joints have osteophytic changes Knee: Left Normal to inspection with no erythema or effusion or obvious bony abnormalities. Lipoma noted over the medial aspect of the knee no change in size Continued tenderness over the medial joint line. ROM full in flexion and extension and lower leg rotation. Ligaments with solid consistent endpoints including ACL, PCL, LCL, MCL. Negative Mcmurray's, Apley's, and Thessalonian tests. Mild painful patellar compression. Patellar glide with mild crepitus. Patellar and quadriceps tendons unremarkable.  Contralateral knee minimal crepitus but full range of motion in minimally tender to palpation.    Impression and Recommendations:     This case required medical decision making of moderate complexity.

## 2014-09-29 NOTE — Patient Instructions (Signed)
Good to see you Julie [name] is your friend. Wear the brace and tighten the bottom.  Continue the pennsaid DO THE EXERCISES 3 TIMES A WEEK.  Come back in 1 month.

## 2014-09-29 NOTE — Assessment & Plan Note (Signed)
Patient has had moderate improvement with conservative therapy at this time. We discussed the possibility of repeating viscous supplementation which patient declined at this time. We also discussed the possibility of repeating formal physical therapy or even aquatic therapy which patient declined as well. Encourage patient to wear better shoes and she patient again proper technique on wearing the brace. Discuss importance of the home exercises and to do the more of a regular basis. Patient will try these interventions and come back again in 4 weeks for further evaluation and treatment.  Spent greater than 25 minutes with patient face-to-face and had greater than 50% of counseling including as described above in assessment and plan.

## 2014-10-25 ENCOUNTER — Ambulatory Visit: Payer: Self-pay | Admitting: Family Medicine

## 2014-10-25 ENCOUNTER — Encounter: Payer: Self-pay | Admitting: Family Medicine

## 2014-10-30 ENCOUNTER — Encounter: Payer: Self-pay | Admitting: Family Medicine

## 2014-10-30 ENCOUNTER — Encounter: Payer: Self-pay | Admitting: *Deleted

## 2014-10-30 ENCOUNTER — Ambulatory Visit (INDEPENDENT_AMBULATORY_CARE_PROVIDER_SITE_OTHER): Payer: Medicare Other | Admitting: Family Medicine

## 2014-10-30 VITALS — BP 118/74 | HR 68 | Ht 61.0 in | Wt 180.0 lb

## 2014-10-30 DIAGNOSIS — M1991 Primary osteoarthritis, unspecified site: Secondary | ICD-10-CM

## 2014-10-30 DIAGNOSIS — M1990 Unspecified osteoarthritis, unspecified site: Secondary | ICD-10-CM

## 2014-10-30 NOTE — Progress Notes (Signed)
  [name] [place], [name] [address] Phone: [phone] Subjective:     CC: Left knee pain follow-up MWN:[id number] [name] is a 80 y.o. female coming in with complaint of left knee pain. Patient does have a past medical history significant for left-sided weakness after surgery for an aneurysm. Patient also has a history of polymyalgia rheumatica. Patient notes that she has osteoarthritic changes in multiple joints unfortunately feels like this could be the same problem in her knee.   Patient was seen previously for severe medial joint line tenderness of the left knee. Patient likely does have severe osteophytic changes. Patient continues to wear the brace. Patient is now ambulating with the aid of a walker due to recent fall which caused her to have significant pain of the left wrist. Patient has been seen by Dr. Fredna Dow for the wrist. Patient states that she is able to angulate better and is only having pain when she gets up from a seated position. Denies any new symptoms. Denies any swelling. Patient did respond very well to injection previously.  Of note patient has had multiple different cortisone injections as well as a viscous supplementation done greater than one year ago.     Past medical history, social, surgical and family history all reviewed in electronic medical record.   Review of Systems: No headache, visual changes, nausea, vomiting, diarrhea, constipation, dizziness, abdominal pain, skin rash, fevers, chills, night sweats, weight loss, swollen lymph nodes, body aches, joint swelling, muscle aches, chest pain, shortness of breath, mood changes.   Objective Blood pressure 118/74, pulse 68, height 5\' 1"  (1.549 m), weight 180 lb (81.647 kg).  General: No apparent distress alert and oriented x3 mood and affect normal, dressed appropriately.  HEENT: Pupils equal, extraocular movements intact  Respiratory: Patient's speak in  full sentences and does not appear short of breath  Cardiovascular: +1 lower extremity edema, non tender, no erythema  Skin: Warm dry intact with no signs of infection or rash on extremities or on axial skeleton.  Abdomen: Soft nontender  Neuro: Cranial nerves II through XII are intact, neurovascularly intact in all extremities with 2+ DTRs and 2+ pulses.  Lymph: No lymphadenopathy of posterior or anterior cervical chain or axillae bilaterally.  Gait normal with good balance and coordination.  MSK:  Mildly tender with near full range of motion and good stability and symmetric strength and tone of shoulders, elbows, wrist, hip,  and ankles bilaterally. Multiple joints have osteophytic changes Knee: Left Normal to inspection with no erythema or effusion or obvious bony abnormalities. Lipoma noted over the medial aspect of the knee no change in size Continued tenderness over the medial joint line less than previous exam. ROM full in flexion and extension and lower leg rotation. Ligaments with solid consistent endpoints including ACL, PCL, LCL, MCL. Negative Mcmurray's, Apley's, and Thessalonian tests. Mild painful patellar compression but less than previous exam Patellar glide with mild crepitus. Patellar and quadriceps tendons unremarkable.  Contralateral knee minimal crepitus but full range of motion in minimally tender to palpation.    Impression and Recommendations:     This case required medical decision making of moderate complexity.

## 2014-10-30 NOTE — Patient Instructions (Signed)
I am sorry about the hand.  For your knee lets continue with what we are doing.  Please try to do the exercises 3 times a week, this will help.  Ice is your friend  Continue the brace.  See me again after the new year and if needed can do another steroid injection.

## 2014-10-30 NOTE — Assessment & Plan Note (Signed)
Discussing and had great length with patient. We discussed continuing the topical anti-inflammatories, home exercises, and patient will continue her pain medications. Patient has not been doing the exercises regularly and we discussed how this is very important. We discussed the possibility of formal physical therapy which patient adamantly declined. Discuss if patient continues to have weakness on the left side of her body after a CVA possibly walking with the walker would be beneficial for a longer course of time. Discussed that we can repeat steroid injection in one month if necessary. Patient will follow-up in one month. Patient also would be a candidate for viscous supplementation which she's had in the past.   Spent greater than 25 minutes with patient face-to-face and had greater than 50% of counseling including as described above in assessment and plan.

## 2014-12-04 ENCOUNTER — Ambulatory Visit: Payer: Medicare Other | Admitting: Family Medicine

## 2014-12-04 DIAGNOSIS — M25532 Pain in left wrist: Secondary | ICD-10-CM | POA: Diagnosis not present

## 2014-12-04 DIAGNOSIS — M25332 Other instability, left wrist: Secondary | ICD-10-CM | POA: Diagnosis not present

## 2014-12-11 ENCOUNTER — Encounter: Payer: Self-pay | Admitting: Family Medicine

## 2014-12-11 ENCOUNTER — Ambulatory Visit (INDEPENDENT_AMBULATORY_CARE_PROVIDER_SITE_OTHER): Payer: Medicare Other | Admitting: Family Medicine

## 2014-12-11 VITALS — BP 110/68 | HR 71 | Ht 61.0 in | Wt 181.0 lb

## 2014-12-11 DIAGNOSIS — M1990 Unspecified osteoarthritis, unspecified site: Secondary | ICD-10-CM

## 2014-12-11 DIAGNOSIS — M1991 Primary osteoarthritis, unspecified site: Secondary | ICD-10-CM

## 2014-12-11 NOTE — Progress Notes (Signed)
  [facility] [place], [place] [address] Phone: [phone] Subjective:     CC: Left knee pain follow-up CBU:[id number] Julie [name] is a 81 y.o. female coming in with complaint of left knee pain. Patient does have a past medical history significant for left-sided weakness after surgery for an aneurysm. Patient also has a history of polymyalgia rheumatica. Patient notes that she has osteoarthritic changes in multiple joints unfortunately feels like this could be the same problem in her knee.   Patient was seen previously for severe medial joint line tenderness of the left knee. Patient likely does have severe osteophytic changes. Patient continues to wear the brace.  Of note patient has had multiple different cortisone injections as well as a viscous supplementation done greater than one year ago.patient's last steroid injection was 09/08/2014     Past medical history, social, surgical and family history all reviewed in electronic medical record.   Review of Systems: No headache, visual changes, nausea, vomiting, diarrhea, constipation, dizziness, abdominal pain, skin rash, fevers, chills, night sweats, weight loss, swollen lymph nodes, body aches, joint swelling, muscle aches, chest pain, shortness of breath, mood changes.   Objective There were no vitals taken for this visit.  General: No apparent distress alert and oriented x3 mood and affect normal, dressed appropriately.  HEENT: Pupils equal, extraocular movements intact  Respiratory: Patient's speak in full sentences and does not appear short of breath  Cardiovascular: +1 lower extremity edema, non tender, no erythema  Skin: Warm dry intact with no signs of infection or rash on extremities or on axial skeleton.  Abdomen: Soft nontender  Neuro: Cranial nerves II through XII are intact, neurovascularly intact in all extremities with 2+ DTRs and 2+ pulses.  Lymph: No lymphadenopathy of  posterior or anterior cervical chain or axillae bilaterally.  Gait normal with good balance and coordination.  MSK:  Mildly tender with near full range of motion and good stability and symmetric strength and tone of shoulders, elbows, wrist, hip,  and ankles bilaterally. Multiple joints have osteophytic changes Knee: Left Normal to inspection with no erythema or effusion or obvious bony abnormalities. Lipoma noted over the medial aspect of the knee no change in size Continued tenderness over the medial joint line less than previous exam. ROM full in flexion and extension and lower leg rotation. Ligaments with solid consistent endpoints including ACL, PCL, LCL, MCL. Negative Mcmurray's, Apley's, and Thessalonian tests. Mild painful patellar compression but less than previous exam Patellar glide with mild crepitus. Patellar and quadriceps tendons unremarkable.  Contralateral knee minimal crepitus but full range of motion in minimally tender to palpation.  After informed written and verbal consent, patient was seated on exam table. Left knee was prepped with alcohol swab and utilizing anterolateral approach, patient's left knee space was injected with 4:1  marcaine 0.5%: Kenalog 40mg /dL. Patient tolerated the procedure well without immediate complications.   Impression and Recommendations:     This case required medical decision making of moderate complexity.

## 2014-12-11 NOTE — Patient Instructions (Addendum)
It is wonderful to see you.  Happy New Year!  Ice is your friend.  Continue to work hard on your strength and be careful walking. Wear brace when you need it.   Continue the topical medicine and the vitamins.   See me again in 1 month.

## 2014-12-11 NOTE — Assessment & Plan Note (Signed)
Patient does have severe arthritis in the left knee in with patient's left-sided weakness from her CVA this is likely never going to get better. We discussed the possibility of surgical intervention again which patient does not want to have done. Patient is getting pain medication from another provider. We discussed the over-the-counter medications and allergies of beneficial. Patient did get a refill of the topical anti-inflammatories today. We discussed continuing the icing protocol and trying to keep the muscle strength. Patient knows that we can do the steroid injections every 3 months if needed. Patient would be a candidate for repeat viscous supplementation if she would like. Patient will come back in one month for further evaluation and treatment.

## 2014-12-13 DIAGNOSIS — M19032 Primary osteoarthritis, left wrist: Secondary | ICD-10-CM | POA: Diagnosis not present

## 2015-01-10 ENCOUNTER — Ambulatory Visit (INDEPENDENT_AMBULATORY_CARE_PROVIDER_SITE_OTHER): Payer: Medicare Other | Admitting: Family Medicine

## 2015-01-10 ENCOUNTER — Other Ambulatory Visit (INDEPENDENT_AMBULATORY_CARE_PROVIDER_SITE_OTHER): Payer: Medicare Other

## 2015-01-10 ENCOUNTER — Encounter: Payer: Self-pay | Admitting: Family Medicine

## 2015-01-10 VITALS — BP 136/84 | HR 70 | Ht 61.0 in | Wt 180.0 lb

## 2015-01-10 DIAGNOSIS — M79675 Pain in left toe(s): Secondary | ICD-10-CM | POA: Diagnosis not present

## 2015-01-10 DIAGNOSIS — M1990 Unspecified osteoarthritis, unspecified site: Secondary | ICD-10-CM | POA: Diagnosis not present

## 2015-01-10 DIAGNOSIS — S92919A Unspecified fracture of unspecified toe(s), initial encounter for closed fracture: Secondary | ICD-10-CM | POA: Insufficient documentation

## 2015-01-10 DIAGNOSIS — S92912A Unspecified fracture of left toe(s), initial encounter for closed fracture: Secondary | ICD-10-CM

## 2015-01-10 DIAGNOSIS — M1991 Primary osteoarthritis, unspecified site: Secondary | ICD-10-CM

## 2015-01-10 DIAGNOSIS — M19032 Primary osteoarthritis, left wrist: Secondary | ICD-10-CM | POA: Diagnosis not present

## 2015-01-10 NOTE — Assessment & Plan Note (Signed)
Doing well, patient did well after the steroid injection. Patient can have this every 3-4 months if necessary. Patient continues to have difficulty she would be a candidate for viscous supplementation. Patient will come back in 2 weeks for her foot and we will discuss them.

## 2015-01-10 NOTE — Progress Notes (Signed)
Pre visit review using our clinic review tool, if applicable. No additional management support is needed unless otherwise documented below in the visit note. 

## 2015-01-10 NOTE — Assessment & Plan Note (Signed)
Ice, post op boot, would not be a surgical candidate.  Patient's could be put in a Cam Walker but due to patient's balance difficulty at think that this will make things worse. Patient will try the postop boot and come back in 2 weeks. We will ultrasound again to make sure that patient is healing appropriately. Encourage patient to increase her vitamin D level at this time.

## 2015-01-10 NOTE — Progress Notes (Signed)
[name] [place], [place] [address] Phone: [phone] Subjective:     CC: Left knee pain follow-up Julie [name] is a 81 y.o. female coming in with complaint of left knee pain. Patient does have a past medical history significant for left-sided weakness after surgery for an aneurysm. Patient also has a history of polymyalgia rheumatica. Patient notes that she has osteoarthritic changes in multiple joints unfortunately feels like this could be the same problem in her knee.   Patient was seen previously for severe medial joint line tenderness of the left knee. Patient does have severe arthritis. Patient was given a steroid injection 1 week ago. Patient was to do home exercises, bracing, we discussed an icing protocol. Patient states she is doing better. Date that the brace does help but seems to slide down on her when walking. Denies any swelling. Denies giving out on her at this time.  Patient is also having left foot pain. 2 weeks ago patient did fall. Stated having significant bruising of her second toe. Still has pain. Patient did get a postop boot which has been beneficial. Has had surgery on this toe previously for a hammertoe. Denies any numbness. Still gives her difficulty she is walking outside the boot.     Past medical history, social, surgical and family history all reviewed in electronic medical record.   Review of Systems: No headache, visual changes, nausea, vomiting, diarrhea, constipation, dizziness, abdominal pain, skin rash, fevers, chills, night sweats, weight loss, swollen lymph nodes, body aches, joint swelling, muscle aches, chest pain, shortness of breath, mood changes.   Objective Blood pressure 136/84, pulse 70, height 5\' 1"  (1.549 Julie [name]), weight 180 lb (81.647 kg), SpO2 93 %.  General: No apparent distress alert and oriented x3 mood and affect normal, dressed appropriately.  HEENT: Pupils equal, extraocular  movements intact  Respiratory: Patient's speak in full sentences and does not appear short of breath  Cardiovascular: +1 lower extremity edema, non tender, no erythema  Skin: Warm dry intact with no signs of infection or rash on extremities or on axial skeleton.  Abdomen: Soft nontender  Neuro: Cranial nerves II through XII are intact, neurovascularly intact in all extremities with 2+ DTRs and 2+ pulses.  Lymph: No lymphadenopathy of posterior or anterior cervical chain or axillae bilaterally.  Gait normal with good balance and coordination.  MSK:  Mildly tender with near full range of motion and good stability and symmetric strength and tone of shoulders, elbows, wrist, hip,  and ankles bilaterally. Multiple joints have osteophytic changes Knee: Left Normal to inspection with no erythema or effusion or obvious bony abnormalities. Lipoma noted over the medial aspect of the knee no change in size Continued tenderness over the medial joint line less than previous exam. ROM full in flexion and extension and lower leg rotation. Ligaments with solid consistent endpoints including ACL, PCL, LCL, MCL. Negative Mcmurray's, Apley's, and Thessalonian tests. Mild painful patellar compression but less than previous exam Patellar glide with mild crepitus. Patellar and quadriceps tendons unremarkable.  Contralateral knee minimal crepitus but full range of motion in minimally tender to palpation. No significant change from previous exam  Bilateral foot exam shows the patient does have severe narrow feet that it does have pes planus bilaterally. Patient does have  Limited musculoskeletal ultrasound was performed and interpreted by Julie [name], Julie [name]  Limited ultrasound of the second phalanx shows the patient does have a very minimally displaced angulated fracture distal  to the PIP joint. Some callus formation noted. Impression: Proximal phalanx fracture of the second toe on the left foot   Impression and  Recommendations:     This case required medical decision making of moderate complexity.

## 2015-01-10 NOTE — Patient Instructions (Addendum)
Good to see you Ice still can help Increase vitamin D 4000IU daily.  We will continue to watch the knee. You are doing well.  If you need new velcro look at [facility].  Have a great valentines day See me again in 2 weeks.

## 2015-01-24 ENCOUNTER — Ambulatory Visit: Payer: Medicare Other | Admitting: Family Medicine

## 2015-02-02 ENCOUNTER — Ambulatory Visit (INDEPENDENT_AMBULATORY_CARE_PROVIDER_SITE_OTHER): Payer: Medicare Other | Admitting: Family Medicine

## 2015-02-02 ENCOUNTER — Encounter: Payer: Self-pay | Admitting: Family Medicine

## 2015-02-02 VITALS — BP 132/82 | HR 81 | Ht 61.0 in | Wt 180.0 lb

## 2015-02-02 DIAGNOSIS — M1990 Unspecified osteoarthritis, unspecified site: Secondary | ICD-10-CM

## 2015-02-02 DIAGNOSIS — S92912A Unspecified fracture of left toe(s), initial encounter for closed fracture: Secondary | ICD-10-CM

## 2015-02-02 DIAGNOSIS — M1991 Primary osteoarthritis, unspecified site: Secondary | ICD-10-CM

## 2015-02-02 NOTE — Assessment & Plan Note (Signed)
Patient did have an injection January 11. Patient would be able to have another injection in 3 weeks. We discussed icing regimen, home exercises, as well as the topical anti-inflammatory. Patient was showed how to wear the brace when greater detail. Patient would be a candidate for viscous supplementation if she decided but wanted to hold today.

## 2015-02-02 NOTE — Progress Notes (Signed)
Pre visit review using our clinic review tool, if applicable. No additional management support is needed unless otherwise documented below in the visit note. 

## 2015-02-02 NOTE — Progress Notes (Signed)
[facility] [place], [place] [address] Phone: [phone] Subjective:     CC: Left knee pain follow-up QBH:[id number] Julie [name] is a 81 y.o. female coming in with complaint of left knee pain. Patient does have a past medical history significant for left-sided weakness after surgery for an aneurysm. Patient also has a history of polymyalgia rheumatica. Patient notes that she has osteoarthritic changes in multiple joints unfortunately feels like this could be the same problem in her knee.   Patient was seen previously for severe medial joint line tenderness of the left knee. Patient does have severe arthritis. Patient was given a steroid injection back in January. Patient states pain is starting to return.  Patient is also having left foot pain. Patient was found to have a minimally displaced angulated fracture of the second metatarsal. We discussed the possibility of patient surgical repair the patient elected to try conservative therapy. Patient states that it continues to be significant a sore. Patient denies though any swelling or any new symptoms. Possibly over the course of time and is improving slowly.     Past medical history, social, surgical and family history all reviewed in electronic medical record.   Review of Systems: No headache, visual changes, nausea, vomiting, diarrhea, constipation, dizziness, abdominal pain, skin rash, fevers, chills, night sweats, weight loss, swollen lymph nodes, body aches, joint swelling, muscle aches, chest pain, shortness of breath, mood changes.   Objective Blood pressure 132/82, pulse 81, height 5\' 1"  (1.549 m), weight 180 lb (81.647 kg), SpO2 90 %.  General: No apparent distress alert and oriented x3 mood and affect normal, dressed appropriately.  HEENT: Pupils equal, extraocular movements intact  Respiratory: Patient's speak in full sentences and does not appear short of breath  Cardiovascular:  +1 lower extremity edema, non tender, no erythema  Skin: Warm dry intact with no signs of infection or rash on extremities or on axial skeleton.  Abdomen: Soft nontender  Neuro: Cranial nerves II through XII are intact, neurovascularly intact in all extremities with 2+ DTRs and 2+ pulses.  Lymph: No lymphadenopathy of posterior or anterior cervical chain or axillae bilaterally.  Gait normal with good balance and coordination.  MSK:  Mildly tender with near full range of motion and good stability and symmetric strength and tone of shoulders, elbows, wrist, hip,  and ankles bilaterally. Multiple joints have osteophytic changes Knee: Left Normal to inspection with no erythema or effusion or obvious bony abnormalities. Lipoma noted over the medial aspect of the knee no change in size Continued tenderness over the medial joint line less than previous exam. ROM full in flexion and extension and lower leg rotation. Ligaments with solid consistent endpoints including ACL, PCL, LCL, MCL. Negative Mcmurray's, Apley's, and Thessalonian tests. Mild painful patellar compression but less than previous exam Patellar glide with mild crepitus. Patellar and quadriceps tendons unremarkable.  Contralateral knee minimal crepitus but full range of motion in minimally tender to palpation. No significant change from previous exam  Bilateral foot exam shows the patient does have severe narrow feet that it does have pes planus bilaterally. Patient does have still some tenderness over the second metatarsal. Patient does have some mild foot drop but appears on the left foot.  Limited musculoskeletal ultrasound was performed and interpreted by Hulan Saas, M  Limited ultrasound of the second phalanx shows the patient does have a continued displaced angulated fracture distal to the PIP joint. Some callus formation noted. Impression: Proximal  phalanx fracture of the second toe on the left foot with interval healing     Impression and Recommendations:     This case required medical decision making of moderate complexity.

## 2015-02-02 NOTE — Assessment & Plan Note (Signed)
Patient has showed some healing on ultrasound today. Patient still has some mild angulation of the toe but I do think because the patient's other comorbidities she would be not a surgical candidate and I do not feel that repeat breaking the toe would make any significant benefit. We discussed continuing some the icing as well as the topical anti-inflammatory as needed. We discussed that this will probably take 6-12 weeks to fully heal. Patient will wear the postop boot for another 2 weeks and then transition into a shoe. Patient and will come back and see me again in 3 weeks.

## 2015-02-02 NOTE — Patient Instructions (Signed)
The toe looks like it is doing well Continue the boot for 2 weeks Into a shoe in the 3rd week Conitnue the vitamin D  See me in 3 weeks and if knee hurts we can do an injection as well.

## 2015-02-09 DIAGNOSIS — S52531A Colles' fracture of right radius, initial encounter for closed fracture: Secondary | ICD-10-CM | POA: Diagnosis not present

## 2015-02-09 DIAGNOSIS — S52531D Colles' fracture of right radius, subsequent encounter for closed fracture with routine healing: Secondary | ICD-10-CM | POA: Diagnosis not present

## 2015-02-12 ENCOUNTER — Encounter (HOSPITAL_BASED_OUTPATIENT_CLINIC_OR_DEPARTMENT_OTHER): Payer: Self-pay | Admitting: *Deleted

## 2015-02-12 DIAGNOSIS — S52529A Torus fracture of lower end of unspecified radius, initial encounter for closed fracture: Secondary | ICD-10-CM | POA: Diagnosis not present

## 2015-02-12 DIAGNOSIS — S52531D Colles' fracture of right radius, subsequent encounter for closed fracture with routine healing: Secondary | ICD-10-CM | POA: Diagnosis not present

## 2015-02-12 NOTE — Progress Notes (Signed)
Pt came in-has had surgery here x2-no labs needed

## 2015-02-13 ENCOUNTER — Ambulatory Visit (HOSPITAL_BASED_OUTPATIENT_CLINIC_OR_DEPARTMENT_OTHER): Payer: Medicare Other | Admitting: Anesthesiology

## 2015-02-13 ENCOUNTER — Encounter (HOSPITAL_BASED_OUTPATIENT_CLINIC_OR_DEPARTMENT_OTHER)
Admission: RE | Disposition: A | Discharge status: Home / Self Care | Payer: Self-pay | Source: Ambulatory Visit | Attending: Orthopedic Surgery

## 2015-02-13 ENCOUNTER — Ambulatory Visit (HOSPITAL_BASED_OUTPATIENT_CLINIC_OR_DEPARTMENT_OTHER)
Admission: RE | Admit: 2015-02-13 | Discharge: 2015-02-14 | Disposition: A | Discharge status: Home / Self Care | Payer: Medicare Other | Source: Ambulatory Visit | Attending: Orthopedic Surgery | Admitting: Orthopedic Surgery

## 2015-02-13 ENCOUNTER — Encounter (HOSPITAL_BASED_OUTPATIENT_CLINIC_OR_DEPARTMENT_OTHER): Payer: Self-pay | Admitting: Orthopedic Surgery

## 2015-02-13 DIAGNOSIS — Z9049 Acquired absence of other specified parts of digestive tract: Secondary | ICD-10-CM | POA: Insufficient documentation

## 2015-02-13 DIAGNOSIS — Z90710 Acquired absence of both cervix and uterus: Secondary | ICD-10-CM | POA: Diagnosis not present

## 2015-02-13 DIAGNOSIS — Z86010 Personal history of colonic polyps: Secondary | ICD-10-CM | POA: Insufficient documentation

## 2015-02-13 DIAGNOSIS — M1710 Unilateral primary osteoarthritis, unspecified knee: Secondary | ICD-10-CM | POA: Insufficient documentation

## 2015-02-13 DIAGNOSIS — M419 Scoliosis, unspecified: Secondary | ICD-10-CM | POA: Diagnosis not present

## 2015-02-13 DIAGNOSIS — K219 Gastro-esophageal reflux disease without esophagitis: Secondary | ICD-10-CM | POA: Insufficient documentation

## 2015-02-13 DIAGNOSIS — M5030 Other cervical disc degeneration, unspecified cervical region: Secondary | ICD-10-CM | POA: Diagnosis not present

## 2015-02-13 DIAGNOSIS — S52571A Other intraarticular fracture of lower end of right radius, initial encounter for closed fracture: Secondary | ICD-10-CM | POA: Insufficient documentation

## 2015-02-13 DIAGNOSIS — E785 Hyperlipidemia, unspecified: Secondary | ICD-10-CM | POA: Insufficient documentation

## 2015-02-13 DIAGNOSIS — S52501A Unspecified fracture of the lower end of right radius, initial encounter for closed fracture: Secondary | ICD-10-CM | POA: Diagnosis not present

## 2015-02-13 DIAGNOSIS — W19XXXA Unspecified fall, initial encounter: Secondary | ICD-10-CM | POA: Diagnosis not present

## 2015-02-13 DIAGNOSIS — Z886 Allergy status to analgesic agent status: Secondary | ICD-10-CM | POA: Diagnosis not present

## 2015-02-13 DIAGNOSIS — M8580 Other specified disorders of bone density and structure, unspecified site: Secondary | ICD-10-CM | POA: Diagnosis not present

## 2015-02-13 DIAGNOSIS — S52531A Colles' fracture of right radius, initial encounter for closed fracture: Secondary | ICD-10-CM | POA: Diagnosis not present

## 2015-02-13 DIAGNOSIS — Z4889 Encounter for other specified surgical aftercare: Secondary | ICD-10-CM

## 2015-02-13 HISTORY — PX: OPEN REDUCTION INTERNAL FIXATION (ORIF) DISTAL RADIAL FRACTURE: SHX5989

## 2015-02-13 SURGERY — OPEN REDUCTION INTERNAL FIXATION (ORIF) DISTAL RADIUS FRACTURE
Anesthesia: Regional | Site: Wrist | Laterality: Right

## 2015-02-13 MED ORDER — ACETAMINOPHEN 325 MG PO TABS: 650.0000 mg | ORAL_TABLET | Freq: Four times a day (QID) | ORAL | Status: DC | PRN

## 2015-02-13 MED ORDER — FENTANYL CITRATE 0.05 MG/ML IJ SOLN
50.0000 ug | INTRAMUSCULAR | Status: DC | PRN
  Administered 2015-02-13 (×2): 50 ug via INTRAVENOUS

## 2015-02-13 MED ORDER — CHLORHEXIDINE GLUCONATE 4 % EX LIQD: 60.0000 mL | Freq: Once | CUTANEOUS | Status: DC

## 2015-02-13 MED ORDER — MIDAZOLAM HCL 2 MG/2ML IJ SOLN: 1.0000 mg | INTRAMUSCULAR | Status: DC | PRN

## 2015-02-13 MED ORDER — ONDANSETRON HCL 4 MG/2ML IJ SOLN
4.0000 mg | Freq: Three times a day (TID) | INTRAMUSCULAR | Status: DC
Start: 2015-02-13 — End: 2015-02-13

## 2015-02-13 MED ORDER — FENTANYL CITRATE 0.05 MG/ML IJ SOLN
INTRAMUSCULAR | Status: AC
  Filled 2015-02-13: qty 2

## 2015-02-13 MED ORDER — ONDANSETRON HCL 4 MG/2ML IJ SOLN: 4.0000 mg | Freq: Four times a day (QID) | INTRAMUSCULAR | Status: DC | PRN

## 2015-02-13 MED ORDER — ESMOLOL HCL 10 MG/ML IV SOLN
INTRAVENOUS | Status: DC | PRN
  Administered 2015-02-13: 10 mg via INTRAVENOUS

## 2015-02-13 MED ORDER — ONDANSETRON HCL 4 MG PO TABS: 4.0000 mg | ORAL_TABLET | Freq: Three times a day (TID) | ORAL | Status: DC | PRN

## 2015-02-13 MED ORDER — PROPOFOL 10 MG/ML IV BOLUS
INTRAVENOUS | Status: DC | PRN
  Administered 2015-02-13: 50 mg via INTRAVENOUS
  Administered 2015-02-13: 100 mg via INTRAVENOUS

## 2015-02-13 MED ORDER — ONDANSETRON HCL 4 MG/2ML IJ SOLN
INTRAMUSCULAR | Status: AC
  Filled 2015-02-13: qty 2

## 2015-02-13 MED ORDER — PROMETHAZINE HCL 25 MG/ML IJ SOLN
6.2500 mg | Freq: Four times a day (QID) | INTRAMUSCULAR | Status: DC | PRN
  Administered 2015-02-13: 6.25 mg via INTRAVENOUS
  Filled 2015-02-13: qty 1

## 2015-02-13 MED ORDER — FENTANYL CITRATE 0.05 MG/ML IJ SOLN
INTRAMUSCULAR | Status: AC
  Filled 2015-02-13: qty 4

## 2015-02-13 MED ORDER — OXYCODONE HCL 5 MG/5ML PO SOLN: 5.0000 mg | Freq: Once | ORAL | Status: AC | PRN

## 2015-02-13 MED ORDER — LACTATED RINGERS IV SOLN
INTRAVENOUS | Status: DC
  Administered 2015-02-13: 10 mL/h via INTRAVENOUS
  Administered 2015-02-13: 14:00:00 via INTRAVENOUS

## 2015-02-13 MED ORDER — ONDANSETRON HCL 4 MG/5ML PO SOLN: 4.0000 mg | Freq: Once | ORAL | Status: DC

## 2015-02-13 MED ORDER — FENTANYL CITRATE 0.05 MG/ML IJ SOLN: 25.0000 ug | INTRAMUSCULAR | Status: DC | PRN

## 2015-02-13 MED ORDER — CEFAZOLIN SODIUM-DEXTROSE 2-3 GM-% IV SOLR
2.0000 g | INTRAVENOUS | Status: AC
  Administered 2015-02-13: 2 g via INTRAVENOUS

## 2015-02-13 MED ORDER — DEXAMETHASONE SODIUM PHOSPHATE 4 MG/ML IJ SOLN
INTRAMUSCULAR | Status: DC | PRN
  Administered 2015-02-13: 10 mg via INTRAVENOUS

## 2015-02-13 MED ORDER — OXYCODONE HCL 5 MG PO TABS: 5.0000 mg | ORAL_TABLET | Freq: Once | ORAL | Status: AC | PRN

## 2015-02-13 MED ORDER — HYDROCODONE-ACETAMINOPHEN 5-325 MG PO TABS: 1.0000 | ORAL_TABLET | Freq: Four times a day (QID) | ORAL | Status: DC | PRN

## 2015-02-13 MED ORDER — ONDANSETRON HCL 4 MG/2ML IJ SOLN: 4.0000 mg | Freq: Three times a day (TID) | INTRAMUSCULAR | Status: DC | PRN

## 2015-02-13 MED ORDER — ONDANSETRON HCL 4 MG/2ML IJ SOLN
INTRAMUSCULAR | Status: DC | PRN
  Administered 2015-02-13: 4 mg via INTRAVENOUS

## 2015-02-13 MED ORDER — LIDOCAINE HCL (CARDIAC) 20 MG/ML IV SOLN
INTRAVENOUS | Status: DC | PRN
  Administered 2015-02-13: 30 mg via INTRAVENOUS
  Administered 2015-02-13: 50 mg via INTRAVENOUS

## 2015-02-13 MED ORDER — LACTATED RINGERS IV SOLN
INTRAVENOUS | Status: DC
  Administered 2015-02-13: 17:00:00 via INTRAVENOUS

## 2015-02-13 SURGICAL SUPPLY — 75 items
BIT DRILL 2.0 LNG QUCK RELEASE (BIT) IMPLANT
BIT DRILL 2.8X5 QR DISP (BIT) ×1 IMPLANT
BLADE MINI RND TIP GREEN BEAV (BLADE) IMPLANT
BLADE SURG 15 STRL LF DISP TIS (BLADE) ×1 IMPLANT
BLADE SURG 15 STRL SS (BLADE) ×2
BNDG CMPR 9X4 STRL LF SNTH (GAUZE/BANDAGES/DRESSINGS) ×1
BNDG COHESIVE 3X5 TAN STRL LF (GAUZE/BANDAGES/DRESSINGS) ×2 IMPLANT
BNDG ESMARK 4X9 LF (GAUZE/BANDAGES/DRESSINGS) ×2 IMPLANT
BNDG GAUZE ELAST 4 BULKY (GAUZE/BANDAGES/DRESSINGS) ×2 IMPLANT
CHLORAPREP W/TINT 26ML (MISCELLANEOUS) ×2 IMPLANT
CORDS BIPOLAR (ELECTRODE) ×2 IMPLANT
COVER BACK TABLE 60X90IN (DRAPES) ×2 IMPLANT
COVER MAYO STAND STRL (DRAPES) ×2 IMPLANT
CUFF TOURNIQUET SINGLE 18IN (TOURNIQUET CUFF) ×1 IMPLANT
DECANTER SPIKE VIAL GLASS SM (MISCELLANEOUS) IMPLANT
DRAPE EXTREMITY T 121X128X90 (DRAPE) ×2 IMPLANT
DRAPE OEC MINIVIEW 54X84 (DRAPES) ×2 IMPLANT
DRAPE SURG 17X23 STRL (DRAPES) ×2 IMPLANT
DRILL 2.0 LNG QUICK RELEASE (BIT) ×2
DRSG KUZMA FLUFF (GAUZE/BANDAGES/DRESSINGS) IMPLANT
ELECT REM PT RETURN 9FT ADLT (ELECTROSURGICAL)
ELECTRODE REM PT RTRN 9FT ADLT (ELECTROSURGICAL) IMPLANT
GAUZE SPONGE 4X4 12PLY STRL (GAUZE/BANDAGES/DRESSINGS) ×2 IMPLANT
GAUZE XEROFORM 1X8 LF (GAUZE/BANDAGES/DRESSINGS) ×2 IMPLANT
GLOVE BIO SURGEON STRL SZ7.5 (GLOVE) ×1 IMPLANT
GLOVE BIOGEL PI IND STRL 7.0 (GLOVE) IMPLANT
GLOVE BIOGEL PI IND STRL 8 (GLOVE) IMPLANT
GLOVE BIOGEL PI IND STRL 8.5 (GLOVE) ×1 IMPLANT
GLOVE BIOGEL PI INDICATOR 7.0 (GLOVE) ×2
GLOVE BIOGEL PI INDICATOR 8 (GLOVE) ×1
GLOVE BIOGEL PI INDICATOR 8.5 (GLOVE) ×1
GLOVE ECLIPSE 6.5 STRL STRAW (GLOVE) ×1 IMPLANT
GLOVE SURG ORTHO 8.0 STRL STRW (GLOVE) ×2 IMPLANT
GOWN STRL REUS W/ TWL LRG LVL3 (GOWN DISPOSABLE) ×1 IMPLANT
GOWN STRL REUS W/TWL LRG LVL3 (GOWN DISPOSABLE) ×2
GOWN STRL REUS W/TWL XL LVL3 (GOWN DISPOSABLE) ×3 IMPLANT
GUIDEWIRE ORTHO 0.054X6 (WIRE) ×3 IMPLANT
K-WIRE .045X4 (WIRE) IMPLANT
NDL PRECISIONGLIDE 27X1.5 (NEEDLE) IMPLANT
NEEDLE PRECISIONGLIDE 27X1.5 (NEEDLE) IMPLANT
NS IRRIG 1000ML POUR BTL (IV SOLUTION) ×2 IMPLANT
PACK BASIN DAY SURGERY FS (CUSTOM PROCEDURE TRAY) ×2 IMPLANT
PAD CAST 3X4 CTTN HI CHSV (CAST SUPPLIES) ×1 IMPLANT
PADDING CAST ABS 3INX4YD NS (CAST SUPPLIES)
PADDING CAST ABS 4INX4YD NS (CAST SUPPLIES) ×1
PADDING CAST ABS COTTON 3X4 (CAST SUPPLIES) IMPLANT
PADDING CAST ABS COTTON 4X4 ST (CAST SUPPLIES) ×1 IMPLANT
PADDING CAST COTTON 3X4 STRL (CAST SUPPLIES) ×2
PENCIL BUTTON HOLSTER BLD 10FT (ELECTRODE) IMPLANT
PLATE ACULOCK 2 NARROW RT (Plate) ×1 IMPLANT
SCREW ACTK 2 NL HEX 3.5.11 (Screw) ×1 IMPLANT
SCREW BN FT 16X2.3XLCK HEX CRT (Screw) IMPLANT
SCREW CORT FX14X2.3XLCK NS (Screw) IMPLANT
SCREW CORTICAL LOCKING 2.3X14M (Screw) ×2 IMPLANT
SCREW CORTICAL LOCKING 2.3X16M (Screw) ×8 IMPLANT
SCREW CORTICAL LOCKING 2.3X18M (Screw) ×2 IMPLANT
SCREW FX16X2.3XLCK SMTH NS CRT (Screw) IMPLANT
SCREW FX18X2.3XSMTH LCK NS CRT (Screw) IMPLANT
SCREW NON LOCK 3.5X10MM (Screw) ×1 IMPLANT
SCREW NON LOCK 3.5X8MM (Screw) ×1 IMPLANT
SLEEVE SCD COMPRESS KNEE MED (MISCELLANEOUS) ×2 IMPLANT
SLING ARM FOAM STRAP LRG (SOFTGOODS) ×1 IMPLANT
SPLINT PLASTER CAST XFAST 3X15 (CAST SUPPLIES) IMPLANT
SPLINT PLASTER XTRA FASTSET 3X (CAST SUPPLIES) ×10
STOCKINETTE 4X48 STRL (DRAPES) ×2 IMPLANT
SUT VIC AB 0 CT1 27 (SUTURE)
SUT VIC AB 0 CT1 27XBRD ANBCTR (SUTURE) IMPLANT
SUT VIC AB 2-0 SH 27 (SUTURE)
SUT VIC AB 2-0 SH 27XBRD (SUTURE) IMPLANT
SUT VIC AB 3-0 FS2 27 (SUTURE) IMPLANT
SUT VICRYL 4-0 PS2 18IN ABS (SUTURE) ×2 IMPLANT
SYR BULB 3OZ (MISCELLANEOUS) ×2 IMPLANT
SYR CONTROL 10ML LL (SYRINGE) IMPLANT
TOWEL OR 17X24 6PK STRL BLUE (TOWEL DISPOSABLE) ×2 IMPLANT
UNDERPAD 30X30 INCONTINENT (UNDERPADS AND DIAPERS) ×1 IMPLANT

## 2015-02-13 NOTE — Anesthesia Procedure Notes (Addendum)
Procedure Name: Intubation Date/Time: 02/13/2015 1:16 PM Performed by: Toula Moos L Pre-anesthesia Checklist: Patient identified, Emergency Drugs available, Suction available, Patient being monitored and Timeout performed Patient Re-evaluated:Patient Re-evaluated prior to inductionOxygen Delivery Method: Circle System Utilized Preoxygenation: Pre-oxygenation with 100% oxygen Intubation Type: IV induction Ventilation: Mask ventilation without difficulty Laryngoscope Size: Miller and 3 Grade View: Grade II Tube type: Oral Tube size: 7.0 mm Number of attempts: 1 Airway Equipment and Method: Stylet and Oral airway Placement Confirmation: ETT inserted through vocal cords under direct vision,  positive ETCO2 and breath sounds checked- equal and bilateral Secured at: 23 cm Tube secured with: Tape Dental Injury: Teeth and Oropharynx as per pre-operative assessment  Comments: RSI

## 2015-02-13 NOTE — Discharge Instructions (Addendum)
[facility] Instructions Hand Surgery  Wound Care: Keep your hand elevated above the level of your heart.  Do not allow it to dangle by your side.  Keep the dressing dry and do not remove it unless your doctor advises you to do so.  He will usually change it at the time of your post-op visit.  Moving your fingers is advised to stimulate circulation but will depend on the site of your surgery.  If you have a splint applied, your doctor will advise you regarding movement.  Activity: Do not drive or operate machinery today.  Rest today and then you may return to your normal activity and work as indicated by your physician.  Diet:  Drink liquids today or eat a light diet.  You may resume a regular diet tomorrow.    General expectations: Pain for two to three days. Fingers may become slightly swollen.  Call your doctor if any of the following occur: Severe pain not relieved by pain medication. Elevated temperature. Dressing soaked with blood. Inability to move fingers. White or bluish color to fingers.  Regional Anesthesia Blocks  1. Numbness or the inability to move the "blocked" extremity may last from 3-48 hours after placement. The length of time depends on the medication injected and your individual response to the medication. If the numbness is not going away after 48 hours, call your surgeon.  2. The extremity that is blocked will need to be protected until the numbness is gone and the  Strength has returned. Because you cannot feel it, you will need to take extra care to avoid injury. Because it may be weak, you may have difficulty moving it or using it. You may not know what position it is in without looking at it while the block is in effect.  3. Bruising and tenderness at the needle site are common side effects and will resolve in a few days.  4. Persistent numbness or new problems with movement should be communicated to the surgeon or the [facility]  [phone] [place] [phone]).   Post Anesthesia Home Care Instructions  Activity: Get plenty of rest for the remainder of the day. A responsible adult should stay with you for 24 hours following the procedure.  For the next 24 hours, DO NOT: -Drive a car -Paediatric nurse -Drink alcoholic beverages -Take any medication unless instructed by your physician -Make any legal decisions or sign important papers.  Meals: Start with liquid foods such as gelatin or soup. Progress to regular foods as tolerated. Avoid greasy, spicy, heavy foods. If nausea and/or vomiting occur, drink only clear liquids until the nausea and/or vomiting subsides. Call your physician if vomiting continues.  Special Instructions/Symptoms: Your throat may feel dry or sore from the anesthesia or the breathing tube placed in your throat during surgery. If this causes discomfort, gargle with warm salt water. The discomfort should disappear within 24 hours.

## 2015-02-13 NOTE — Anesthesia Postprocedure Evaluation (Signed)
Anesthesia Post Note  Patient: Julie [name]  Procedure(s) Performed: Procedure(s) (LRB): OPEN REDUCTION INTERNAL FIXATION (ORIF) RIGHT DISTAL RADIUS (Right)  Anesthesia type: General  Patient location: PACU  Post pain: Pain level controlled and Adequate analgesia  Post assessment: Post-op Vital signs reviewed, Patient's Cardiovascular Status Stable, Respiratory Function Stable, Patent Airway and Pain level controlled  Last Vitals:  Filed Vitals:   02/13/15 1515  BP: 153/75  Pulse: 88  Temp:   Resp: 17    Post vital signs: Reviewed and stable  Level of consciousness: awake, alert  and oriented  Complications: No apparent anesthesia complications

## 2015-02-13 NOTE — Transfer of Care (Signed)
Immediate Anesthesia Transfer of Care Note  Patient: Julie [name]  Procedure(s) Performed: Procedure(s): OPEN REDUCTION INTERNAL FIXATION (ORIF) RIGHT DISTAL RADIUS (Right)  Patient Location: PACU  Anesthesia Type:General  Level of Consciousness: awake and alert   Airway & Oxygen Therapy: Patient Spontanous Breathing and Patient connected to face mask oxygen  Post-op Assessment: Report given to RN and Post -op Vital signs reviewed and stable  Post vital signs: Reviewed and stable  Last Vitals:  Filed Vitals:   02/13/15 1305  BP:   Pulse: 97  Temp:   Resp: 19    Complications: No apparent anesthesia complications

## 2015-02-13 NOTE — Brief Op Note (Signed)
02/13/2015  2:28 PM  PATIENT:  Julie [name]  81 y.o. female  PRE-OPERATIVE DIAGNOSIS:  RIGHT DISTAL RADIUS FRACTURE   POST-OPERATIVE DIAGNOSIS:  RIGHT DISTAL RADIUS FRACTURE   PROCEDURE:  Procedure(s): OPEN REDUCTION INTERNAL FIXATION (ORIF) RIGHT DISTAL RADIUS (Right)  SURGEON:  Surgeon(s) and Role:    * Daryll Brod, MD - Primary    * Leanora Cover, MD - Assisting  PHYSICIAN ASSISTANT:   ASSISTANTS: K Nate Common,MD   ANESTHESIA:   regional and general  EBL:  Total I/O In: 1400 [I.V.:1400] Out: -   BLOOD ADMINISTERED:none  DRAINS: none   LOCAL MEDICATIONS USED:  NONE  SPECIMEN:  No Specimen  DISPOSITION OF SPECIMEN:  N/A  COUNTS:  YES  TOURNIQUET:   Total Tourniquet Time Documented: Upper Arm (Right) - 54 minutes Total: Upper Arm (Right) - 54 minutes   DICTATION: .Other Dictation: Dictation Number [id number]  PLAN OF CARE: Discharge to home after PACU  PATIENT DISPOSITION:  PACU - hemodynamically stable.

## 2015-02-13 NOTE — Progress Notes (Signed)
Assisted Dr. Hodierne with right, ultrasound guided, supraclavicular block. Side rails up, monitors on throughout procedure. See vital signs in flow sheet. Tolerated Procedure well.  

## 2015-02-13 NOTE — Anesthesia Preprocedure Evaluation (Signed)
Anesthesia Evaluation  Patient identified by MRN, date of birth, ID band Patient awake    Reviewed: Allergy & Precautions, NPO status , Patient's Chart, lab work & pertinent test results  Airway Mallampati: II   Neck ROM: full    Dental   Pulmonary neg pulmonary ROS,          Cardiovascular negative cardio ROS      Neuro/Psych    GI/Hepatic GERD-  ,  Endo/Other  obese  Renal/GU      Musculoskeletal  (+) Arthritis -,   Abdominal   Peds  Hematology   Anesthesia Other Findings   Reproductive/Obstetrics                             Anesthesia Physical Anesthesia Plan  ASA: II  Anesthesia Plan: General and Regional   Post-op Pain Management: MAC Combined w/ Regional for Post-op pain   Induction: Intravenous  Airway Management Planned: LMA  Additional Equipment:   Intra-op Plan:   Post-operative Plan:   Informed Consent: I have reviewed the patients History and Physical, chart, labs and discussed the procedure including the risks, benefits and alternatives for the proposed anesthesia with the patient or authorized representative who has indicated his/her understanding and acceptance.     Plan Discussed with: CRNA, Anesthesiologist and Surgeon  Anesthesia Plan Comments:         Anesthesia Quick Evaluation

## 2015-02-13 NOTE — H&P (Signed)
Julie [name] is a 81 year old right hand dominant female who has been seen for a long period of time. She suffered a fall on 02-08-15. She is complaining of pain in her left wrist with a hematoma on the ulnar aspect. She was seen in the office and referred for a CT scan. The CT scan has been doing revealing an impacted fracture of her right distal radius. She was placed in a splint at the time of surgery. She is s/p osteotomy for malunion on her left side. She was given Vicodin to take for discomfort.  PAST MEDICAL HISTORY:  She is allergic to Codeine. She is on Cymbalta and Hydrocodone. She has had a hysterectomy and colectomy, aneurysm resection and osteotomy of her left distal radius, open reduction repair central slip of her small finger left hand.  FAMILY MEDICAL HISTORY: Negative.  SOCIAL HISTORY:  She does not smoke or use alcohol. She is married.  REVIEW OF SYSTEMS: Positive for sleep disorder, balance problems, otherwise negative 14 points.  Julie [name] is an 81 y.o. female.   Chief Complaint: right distal radius fracture HPI: see above  Past Medical History  Diagnosis Date  . GERD (gastroesophageal reflux disease)   . Esophagitis   . Duodenitis   . Chronic leg pain   . Trochanteric bursitis   . OA (osteoarthritis) of knee   . TMJ syndrome   . Seborrheic dermatitis   . HLD (hyperlipidemia)   . Colon polyp   . Diverticulosis of colon     internal----Dr. Vira Agar  . Hemorrhoids   . Adenoma   . IBS (irritable bowel syndrome)     with PP diarrhea  . Scoliosis   . Osteopenia   . DDD (degenerative disc disease), cervical     Past Surgical History  Procedure Laterality Date  . Total abdominal hysterectomy    . Hemorrhoid surgery    . Foot surgery  2010    bilateral hammer toes and "knot"  . Colonoscopy  1/11    polyps-hyperplastic and adenomatous  . Carpal tunnel release Left 01/18/2013    Procedure: CARPAL TUNNEL RELEASE;  Surgeon: Wynonia Sours, MD;  Location: [facility];  Service: Orthopedics;  Laterality: Left;  OSTEOTOMY LEFT DISTAL RADIUS BONE CHIPS CARPAL TUNNEL RELEASE LEFT   . Wrist osteotomy Left 01/18/2013    Procedure: WRIST OSTEOTOMY;  Surgeon: Wynonia Sours, MD;  Location: [facility];  Service: Orthopedics;  Laterality: Left;  . Cerebral aneurysm repair  2001  . Open reduction internal fixation (orif) finger with radial bone graft Left 11/01/2013    Procedure: OPEN REDUCTION INTERNAL FIXATION (ORIF) LEFT SMALL FINGER;  Surgeon: Wynonia Sours, MD;  Location: [facility];  Service: Orthopedics;  Laterality: Left;    Family History  Problem Relation Age of Onset  . Colon cancer Mother   . Osteoporosis      hip fracture  . Stroke Brother   . Hypertension Brother   . Other Brother     Heart problem  . Colon cancer Brother   . Colon cancer Maternal Aunt    Social History:  reports that she has never smoked. She does not have any smokeless tobacco history on file. She reports that she does not drink alcohol or use illicit drugs.  Allergies:  Allergies  Allergen Reactions  . Codeine     REACTION: headache and nausea and vomiting    No prescriptions prior to admission    No  results found for this or any previous visit (from the past 42 hour(s)).  No results found.   Pertinent items are noted in HPI.  Height 5\' 2"  (1.575 m), weight 79.833 kg (176 lb).  General appearance: alert, cooperative and appears stated age Head: Normocephalic, without obvious abnormality Neck: no JVD Resp: clear to auscultation bilaterally Cardio: regular rate and rhythm, S1, S2 normal, no murmur, click, rub or gallop GI: soft, non-tender; bowel sounds normal; no masses,  no organomegaly Extremities: hematoma and deformity right distal radius Pulses: 2+ and symmetric Skin: Skin color, texture, turgor normal. No rashes or lesions Neurologic: Grossly normal Incision/Wound: na  Assessment/Plan X-rays reveal a  comminuted intraarticular fracture of her right distal radius.   CT scan reveals comminuted, impacted, dorsally angulated distal radius fracture  Plan is ORIF with bone grafting right distal radius. This will be scheduled as an outpatient under regional anesthesia. Pre, peri and post op care are discussed along with risks and complications. Patient is aware there is no guarantee with surgery, possibility of infection, injury to arteries, nerves, and tendons, incomplete relief, nonunion, loss of fixation and dystrophy.  Ryley Teater R 02/13/2015, 8:30 AM

## 2015-02-13 NOTE — Op Note (Addendum)
Other Dictation: Dictation Number [id number] Intra-operative fluoroscopic images in the AP, lateral, and oblique views were taken and evaluated by myself.  Reduction and hardware placement were confirmed.  There was no intraarticular penetration of permanent hardware.

## 2015-02-14 ENCOUNTER — Encounter (HOSPITAL_BASED_OUTPATIENT_CLINIC_OR_DEPARTMENT_OTHER): Payer: Self-pay | Admitting: Orthopedic Surgery

## 2015-02-14 DIAGNOSIS — Z886 Allergy status to analgesic agent status: Secondary | ICD-10-CM | POA: Diagnosis not present

## 2015-02-14 DIAGNOSIS — K219 Gastro-esophageal reflux disease without esophagitis: Secondary | ICD-10-CM | POA: Diagnosis not present

## 2015-02-14 DIAGNOSIS — S52571A Other intraarticular fracture of lower end of right radius, initial encounter for closed fracture: Secondary | ICD-10-CM | POA: Diagnosis not present

## 2015-02-14 DIAGNOSIS — E785 Hyperlipidemia, unspecified: Secondary | ICD-10-CM | POA: Diagnosis not present

## 2015-02-14 DIAGNOSIS — M5030 Other cervical disc degeneration, unspecified cervical region: Secondary | ICD-10-CM | POA: Diagnosis not present

## 2015-02-14 DIAGNOSIS — Z9049 Acquired absence of other specified parts of digestive tract: Secondary | ICD-10-CM | POA: Diagnosis not present

## 2015-02-14 DIAGNOSIS — M419 Scoliosis, unspecified: Secondary | ICD-10-CM | POA: Diagnosis not present

## 2015-02-14 DIAGNOSIS — Z90710 Acquired absence of both cervix and uterus: Secondary | ICD-10-CM | POA: Diagnosis not present

## 2015-02-14 DIAGNOSIS — M1710 Unilateral primary osteoarthritis, unspecified knee: Secondary | ICD-10-CM | POA: Diagnosis not present

## 2015-02-14 DIAGNOSIS — M8580 Other specified disorders of bone density and structure, unspecified site: Secondary | ICD-10-CM | POA: Diagnosis not present

## 2015-02-14 DIAGNOSIS — Z86010 Personal history of colonic polyps: Secondary | ICD-10-CM | POA: Diagnosis not present

## 2015-02-14 NOTE — Addendum Note (Signed)
Addendum  created 02/14/15 1062 by Ernesta Amble Naythen Heikkila   Modules edited: Charges VN

## 2015-02-14 NOTE — Op Note (Signed)
Julie [name], Julie [name]             ACCOUNT NO.:  [id number]  MEDICAL RECORD NO.:  [id number]  LOCATION:                                 FACILITY:  PHYSICIAN:  Daryll Brod, M.D.            DATE OF BIRTH:  DATE OF PROCEDURE:  02/13/2015 DATE OF DISCHARGE:                              OPERATIVE REPORT   PREOPERATIVE DIAGNOSIS:  Comminuted intra-articular fracture, right distal radius.  POSTOPERATIVE DIAGNOSIS:  Comminuted intra-articular fracture, right distal radius.  OPERATION:  Open reduction and internal fixation with Acumed volar plate, right wrist.  SURGEON:  Daryll Brod, M.D.  ASSISTANT:  Leanora Cover, MD  ANESTHESIA:  Supraclavicular block, general.  ANESTHESIOLOGIST:  Albertha Ghee, MD.  HISTORY:  The patient is an 81 year old female, who suffered a fall with fracture of her right distal radius.  X-rays revealed a probable fracture.  CT scan revealed a comminuted impacted intra-articular fracture of her right distal radius.  She is admitted for open reduction and internal fixation, possible allograft.  Pre, peri, and postoperative course have been discussed with her and her husband and they are aware that there is no guarantee with the surgery, possibility of infection; recurrence of injury to arteries, nerves, tendons, incomplete relief of symptoms, and dystrophy.  In the preoperative area, the patient is seen, the extremity marked by both the patient and surgeon.  Antibiotic given.  PROCEDURE IN DETAIL:  The patient was brought to the operating room, where a general anesthetic was given, a supraclavicular block was given in the preoperative area.  She was prepped using ChloraPrep, supine position with the right arm free.  A 3-minute dry time was allowed. Time-out taken, confirming the patient and procedure.  The limb was exsanguinated with an Esmarch bandage.  Tourniquet placed on the upper arm was inflated to 250 mmHg.  A volar incision was made for  application of a distal volar radial plate, carried down through subcutaneous tissue directly over the flexor carpi radialis tendon.  The tendon was identified, the sheath opened, the floor was then incised, the dissection carried down to the pronator quadratus.  This was incised and elevated off from the distal radius.  Fracture was impacted.  The brachioradialis was released taking care to protect the first dorsal extensor compartment.  The fracture was then manipulated, reduced, confirmed on AP and lateral x-rays.  The narrow volar plate from Acumed, the Acumed set was then selected, this was pinned, found be slightly filtered, was re-adjusted after removal of the retaining pins, position appeared acceptable on lateral x-rays.  The fixation gliding screw was applied proximally, this was a 10 mm screw.  The distal pins were then selected, these were placed from the ulnar to the radial side with locking pegs on the ulnar side and locking screws in the radial styloid. These measured between 16 and 18 mm.  The remaining 2 screws were placed proximally, these measured 11 and 8 mm.  This firmly fixed the fracture in place.  Pronation and supination was full with full flexion/extension of her wrist.  AP, lateral, and oblique x-rays revealed that the fracture was reduced in AP and lateral directions.  There  was no penetration of the joint by any of the plates and screws.  The wound was copiously irrigated with saline.  As much of the pronator quadratus was repaired as possible.  The subcutaneous tissue was then closed with interrupted 4-0 Vicryl as was the pronator quadratus and the skin repaired with interrupted 4-0 nylon sutures.  X-rays confirmed positioning of the plate and screws in good position with reduction of the fracture.  We were not able to place any bone graft into the fracture site.  Once it was reduced and plated, the dorsal palmar splints were then applied.  On deflation of  the tourniquet, all fingers immediately pinked.  She was taken to the recovery room for observation in a satisfactory condition.  She will be discharged home to return to the [facility] in 1 week on [facility].          ______________________________ Daryll Brod, M.D.     GK/MEDQ  D:  02/13/2015  T:  02/14/2015  Job:  [id number]

## 2015-02-21 DIAGNOSIS — S52531A Colles' fracture of right radius, initial encounter for closed fracture: Secondary | ICD-10-CM | POA: Diagnosis not present

## 2015-02-21 DIAGNOSIS — S52531D Colles' fracture of right radius, subsequent encounter for closed fracture with routine healing: Secondary | ICD-10-CM | POA: Diagnosis not present

## 2015-02-26 ENCOUNTER — Encounter: Payer: Self-pay | Admitting: Family Medicine

## 2015-02-26 ENCOUNTER — Ambulatory Visit (INDEPENDENT_AMBULATORY_CARE_PROVIDER_SITE_OTHER): Payer: Medicare Other | Admitting: Family Medicine

## 2015-02-26 VITALS — BP 124/80 | HR 64 | Ht 61.0 in | Wt 181.0 lb

## 2015-02-26 DIAGNOSIS — S92912A Unspecified fracture of left toe(s), initial encounter for closed fracture: Secondary | ICD-10-CM | POA: Diagnosis not present

## 2015-02-26 DIAGNOSIS — M1990 Unspecified osteoarthritis, unspecified site: Secondary | ICD-10-CM

## 2015-02-26 DIAGNOSIS — M1991 Primary osteoarthritis, unspecified site: Secondary | ICD-10-CM

## 2015-02-26 NOTE — Progress Notes (Signed)
Pre visit review using our clinic review tool, if applicable. No additional management support is needed unless otherwise documented below in the visit note. 

## 2015-02-26 NOTE — Patient Instructions (Signed)
Good to see you Get in a shoe in 1 week Continue the brace for the knee Continue the exercises Continue the vitamin D that will help with the healing.  See me again in 3 weeks and we will check on the knee and get you get the orthotics.

## 2015-02-26 NOTE — Progress Notes (Signed)
[name], [name] Phone: [phone] Subjective:     CC: Left knee pain follow-up Toe fracture follow-up  RSW:[id number] Julie [name] is a 81 y.o. female coming in with complaint of left knee pain. Patient does have a past medical history significant for left-sided weakness after surgery for an aneurysm. Patient also has a history of polymyalgia rheumatica.   Patient was seen previously for severe medial joint line tenderness of the left knee. Patient does have severe arthritis. Patient was given a steroid injection back in January. Patient states pain is starting to return. Patient did recently have a fall and this seemed to aggravate her knee some more. Patient is taking pain medications. Patient is wearing the brace with some mild improvement.  Patient is also having left foot pain. Patient was found to have a second metatarsal stress fracture and was put in a postop boot. Patient states that it is improving but slowly. Patient states that it only hurts with a significant amount walking. Denies any numbness or tingling. Denies any swelling of the foot.     Past medical history, social, surgical and family history all reviewed in electronic medical record.   Review of Systems: No headache, visual changes, nausea, vomiting, diarrhea, constipation, dizziness, abdominal pain, skin rash, fevers, chills, night sweats, weight loss, swollen lymph nodes, body aches, joint swelling, muscle aches, chest pain, shortness of breath, mood changes.   Objective Height 5\' 1"  (1.549 m), weight 181 lb (82.101 kg).  General: No apparent distress alert and oriented x3 mood and affect normal, dressed appropriately.  HEENT: Pupils equal, extraocular movements intact  Respiratory: Patient's speak in full sentences and does not appear short of breath  Cardiovascular: +1 lower extremity edema, non tender, no erythema  Skin: Warm dry intact  with no signs of infection or rash on extremities or on axial skeleton.  Abdomen: Soft nontender  Neuro: Cranial nerves II through XII are intact, neurovascularly intact in all extremities with 2+ DTRs and 2+ pulses.  Lymph: No lymphadenopathy of posterior or anterior cervical chain or axillae bilaterally.  Gait normal with good balance and coordination.  MSK:  Mildly tender with near full range of motion and good stability and symmetric strength and tone of shoulders, elbows, wrist, hip,  and ankles bilaterally. Multiple joints have osteophytic changes Knee: Left Normal to inspection with no erythema or effusion or obvious bony abnormalities. Lipoma noted over the medial aspect of the knee no change in size Worsening medial joint line tenderness ROM full in flexion and extension and lower leg rotation. Ligaments with solid consistent endpoints including ACL, PCL, LCL, MCL. Negative Mcmurray's, Apley's, and Thessalonian tests. Mild painful patellar compression but less than previous exam Patellar glide with mild crepitus. Patellar and quadriceps tendons unremarkable.  Contralateral knee minimal crepitus but full range of motion in minimally tender to palpation. Mild worsening of left knee compared to previous exam  Bilateral foot exam shows the patient does have severe narrow feet that it does have pes planus bilaterally. Patient does have some mild foot drop but appears on the left foot. Continued tenderness over the second metatarsal but improved  Limited musculoskeletal ultrasound was performed and interpreted by Hulan Saas, M  Limited ultrasound of the second phalanx shows the patient does improvement of the angulation that was noted previously. Patient does have increasing callus formation from previous exam. Impression: Proximal phalanx fracture of the second toe on the left foot with  continued  After informed written and verbal consent, patient was seated on exam table. Left knee  was prepped with alcohol swab and utilizing anterolateral approach, patient's left knee space was injected with 4:1  marcaine 0.5%: Kenalog 40mg /dL. Patient tolerated the procedure well without immediate complications.   Impression and Recommendations:     This case required medical decision making of moderate complexity.

## 2015-02-26 NOTE — Assessment & Plan Note (Signed)
Patient and repeat injection again today. This is 10 weeks from patient's previous injection. If patient has any worsening pain she would be a candidate for viscous supplementation. Patient will continue to wear the brace and continue with topical anti-inflammatories. We discussed the icing protocol and the importance of the home exercises. Patient will follow-up in 3 weeks.

## 2015-02-26 NOTE — Assessment & Plan Note (Signed)
Patient is doing somewhat better but patient does have narrow feet and significant breakdown of the transverse arch that likely is contributing to the discomfort. Patient will be put into custom orthotics in the near future. I would like patient to wear the postop boot daily for another week and then slowly transition into a regular shoe. Patient has any worsening symptoms she'll go back to the postop boot. Patient knows that this will take likely another 3-6 weeks to be pain-free.

## 2015-03-15 ENCOUNTER — Emergency Department
Admit: 2015-03-15 | Disposition: A | Discharge status: Home / Self Care | Payer: Self-pay | Admitting: Emergency Medicine

## 2015-03-15 DIAGNOSIS — S0101XA Laceration without foreign body of scalp, initial encounter: Secondary | ICD-10-CM | POA: Diagnosis not present

## 2015-03-15 DIAGNOSIS — S62002A Unspecified fracture of navicular [scaphoid] bone of left wrist, initial encounter for closed fracture: Secondary | ICD-10-CM | POA: Diagnosis not present

## 2015-03-15 DIAGNOSIS — M84442A Pathological fracture, left hand, initial encounter for fracture: Secondary | ICD-10-CM | POA: Diagnosis not present

## 2015-03-15 DIAGNOSIS — S0990XA Unspecified injury of head, initial encounter: Secondary | ICD-10-CM | POA: Diagnosis not present

## 2015-03-15 DIAGNOSIS — S6992XA Unspecified injury of left wrist, hand and finger(s), initial encounter: Secondary | ICD-10-CM | POA: Diagnosis not present

## 2015-03-15 DIAGNOSIS — S0190XA Unspecified open wound of unspecified part of head, initial encounter: Secondary | ICD-10-CM | POA: Diagnosis not present

## 2015-03-15 DIAGNOSIS — W19XXXA Unspecified fall, initial encounter: Secondary | ICD-10-CM | POA: Diagnosis not present

## 2015-03-16 ENCOUNTER — Telehealth: Payer: Self-pay

## 2015-03-16 NOTE — Telephone Encounter (Signed)
She needs one -please schedule that or have her get it wherever she is being seen

## 2015-03-16 NOTE — Telephone Encounter (Signed)
Patient informed she does need a tetanus shot.  She will call insurance to see how about getting shot.  She will also call back to schedule an appointment to get staples removed.

## 2015-03-16 NOTE — Telephone Encounter (Signed)
Pt left v/m; on 03/15/15 pt fell and hit head and had 8 staples. Pt not sure when last tetanus shot and wants to know if needs tetanus shot; immunization record has tetanus shot 05/01/2005.

## 2015-03-19 ENCOUNTER — Telehealth: Payer: Self-pay | Admitting: Family Medicine

## 2015-03-19 NOTE — Telephone Encounter (Signed)
That will be fine.   Thank you

## 2015-03-19 NOTE — Telephone Encounter (Signed)
Patient had an appt on 04/19 but needed to reschedule because she fell again and needs to get staples removed from her head tomorrow. (she wanted to make sure I told you that). I went ahead and rescheduled her for 04/27 at 3:15. I did see that she is to see Leveda Anna also with this appt. I just wanted to be sure that this appt time is ok.

## 2015-03-20 ENCOUNTER — Ambulatory Visit: Payer: Medicare Other | Admitting: Family Medicine

## 2015-03-21 DIAGNOSIS — S52531D Colles' fracture of right radius, subsequent encounter for closed fracture with routine healing: Secondary | ICD-10-CM | POA: Diagnosis not present

## 2015-03-26 ENCOUNTER — Encounter: Payer: Self-pay | Admitting: Family Medicine

## 2015-03-26 ENCOUNTER — Ambulatory Visit (INDEPENDENT_AMBULATORY_CARE_PROVIDER_SITE_OTHER): Payer: Medicare Other | Admitting: Family Medicine

## 2015-03-26 VITALS — BP 132/76 | HR 78 | Temp 98.4°F | Wt 176.5 lb

## 2015-03-26 DIAGNOSIS — S0191XA Laceration without foreign body of unspecified part of head, initial encounter: Secondary | ICD-10-CM | POA: Insufficient documentation

## 2015-03-26 DIAGNOSIS — R8290 Unspecified abnormal findings in urine: Secondary | ICD-10-CM

## 2015-03-26 DIAGNOSIS — R2689 Other abnormalities of gait and mobility: Secondary | ICD-10-CM | POA: Diagnosis not present

## 2015-03-26 DIAGNOSIS — Z9181 History of falling: Secondary | ICD-10-CM

## 2015-03-26 DIAGNOSIS — Z23 Encounter for immunization: Secondary | ICD-10-CM | POA: Diagnosis not present

## 2015-03-26 LAB — POCT URINALYSIS DIPSTICK
Blood, UA: NEGATIVE
Glucose, UA: NEGATIVE
Ketones, UA: NEGATIVE
Nitrite, UA: NEGATIVE
Spec Grav, UA: 1.025
Urobilinogen, UA: 0.2
pH, UA: 5.5

## 2015-03-26 MED ORDER — HYDROCODONE-ACETAMINOPHEN 5-325 MG PO TABS: 1.0000 | ORAL_TABLET | Freq: Four times a day (QID) | ORAL | Status: DC | PRN

## 2015-03-26 NOTE — Assessment & Plan Note (Signed)
8 staples removed in anterior scalp  Large scab with dried blood present-no s/s of infection Td today ok'd her for a gentle shampoo

## 2015-03-26 NOTE — Progress Notes (Signed)
Pre visit review using our clinic review tool, if applicable. No additional management support is needed unless otherwise documented below in the visit note. 

## 2015-03-26 NOTE — Assessment & Plan Note (Signed)
One plus leuk on ua  Sent for cx inst to inc water intake

## 2015-03-26 NOTE — Progress Notes (Signed)
Subjective:    Patient ID: Julie [name], female    DOB: July 20, 1932, 81 y.o.   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 Observation after surgery 02/13/2015  . Phalanx fracture, foot 01/10/2015  . Colon cancer screening 09/22/2014  . Osteoarthritis of left lower extremity 09/09/2014  . History of falling 04/26/2014  . Poor balance 04/26/2014  . Left-sided weakness 04/26/2014  . PMR (polymyalgia rheumatica) 10/11/2013  . Encounter for Medicare annual wellness exam 07/13/2013  . Risk for falls 07/13/2013  . Skin tag of labia 03/14/2013  . Back pain, thoracic 08/07/2011  . Weakness of left leg 07/30/2011  . BACK PAIN, LUMBAR 12/10/2010  . BREAST MASS, RIGHT 09/13/2010  . Vitamin D deficiency 08/16/2010  . GERD 03/26/2010  . DYSPEPSIA 03/26/2010  . IRRITABLE BOWEL SYNDROME 03/26/2010  . Osteoporosis 08/27/2009  . Hyperlipidemia 07/24/2009  . DIVERTICULOSIS, COLON 07/24/2009  . OSTEOARTHRITIS 07/24/2009  . DIARRHEA, PERSISTENT 07/24/2009  . COLONIC POLYPS, HX OF 07/24/2009  . POSTMENOPAUSAL STATUS 07/24/2009   Past Medical History  Diagnosis Date  . GERD (gastroesophageal reflux disease)   . Esophagitis   . Duodenitis   . Chronic leg pain   . Trochanteric bursitis   . OA  (osteoarthritis) of knee   . TMJ syndrome   . Seborrheic dermatitis   . HLD (hyperlipidemia)   . Colon polyp   . Diverticulosis of colon     internal----Dr. Vira Agar  . Hemorrhoids   . Adenoma   . IBS (irritable bowel syndrome)     with PP diarrhea  . Scoliosis   . Osteopenia   . DDD (degenerative disc disease), cervical    Past Surgical History  Procedure Laterality Date  . Total abdominal hysterectomy    . Hemorrhoid surgery    . Foot surgery  2010    bilateral hammer toes and "knot"  . Colonoscopy  1/11    polyps-hyperplastic and adenomatous  . Carpal tunnel release Left 01/18/2013    Procedure: CARPAL TUNNEL RELEASE;  Surgeon: Wynonia Sours, MD;  Location: [place];  Service: Orthopedics;  Laterality: Left;  OSTEOTOMY LEFT DISTAL RADIUS BONE CHIPS CARPAL TUNNEL RELEASE LEFT   . Wrist osteotomy Left 01/18/2013    Procedure: WRIST OSTEOTOMY;  Surgeon: Wynonia Sours, MD;  Location: [place];  Service: Orthopedics;  Laterality: Left;  . Cerebral aneurysm repair  2001  . Open reduction internal fixation (orif) finger with radial bone graft Left 11/01/2013    Procedure: OPEN REDUCTION INTERNAL FIXATION (ORIF) LEFT SMALL FINGER;  Surgeon: Wynonia Sours, MD;  Location: [place];  Service: Orthopedics;  Laterality: Left;  . Open reduction internal fixation (orif) distal radial fracture Right 02/13/2015    Procedure: OPEN REDUCTION INTERNAL FIXATION (ORIF) RIGHT DISTAL RADIUS;  Surgeon: Daryll Brod, MD;  Location: [place];  Service: Orthopedics;  Laterality: Right;   History  Substance Use Topics  . Smoking status: Never Smoker   . Smokeless tobacco: Not on file  . Alcohol Use: No   Family History  Problem Relation Age of Onset  . Colon cancer Mother   . Osteoporosis      hip fracture  . Stroke Brother   . Hypertension Brother   . Other Brother     Heart problem  . Colon cancer Brother   . Colon cancer Maternal  Aunt    Allergies  Allergen Reactions  . Codeine     REACTION: headache and nausea and vomiting   Current Outpatient Prescriptions on File Prior to Visit  Medication Sig Dispense Refill  . acetaminophen (TYLENOL) 500 MG tablet Take 500 mg by mouth every 6 (six) hours as needed.      . diphenhydramine-acetaminophen (TYLENOL PM) 25-500 MG TABS Take 1 tablet by mouth at bedtime as needed.    . famotidine (PEPCID AC) 10 MG chewable tablet Chew 10 mg by mouth daily as needed.      . Glucosamine Sulfate 1000 MG CAPS Take 2,000 mg by mouth daily.    Marland Kitchen HYDROcodone-acetaminophen (NORCO) 5-325 MG per tablet Take 1 tablet by mouth every 6 (six) hours as needed for moderate pain. 30 tablet 0  . ondansetron (ZOFRAN) 4 MG tablet Take 1 tablet (4 mg total) by mouth every 8 (eight) hours as needed for nausea or vomiting. 20 tablet 0   No current facility-administered medications on file prior to visit.       Review of Systems Review of Systems  Constitutional: Negative for fever, appetite change, fatigue and unexpected weight change.  Eyes: Negative for pain and visual disturbance.  Respiratory: Negative for cough and shortness of breath.   Cardiovascular: Negative for cp or palpitations    Gastrointestinal: Negative for nausea, diarrhea and constipation.  Genitourinary: Negative for urgency and frequency. neg for dysuria or hematuria  Skin: Negative for pallor or rash  pos for scalp laceration with staples Neurological: Negative for weakness,, numbness and headaches. pos for poor balance and falls  Hematological: Negative for adenopathy. Does not bruise/bleed easily.  Psychiatric/Behavioral: Negative for dysphoric mood. The patient is not nervous/anxious.         Objective:   Physical Exam  Constitutional: She appears well-developed and well-nourished. No distress.  Frail appearing obese elderly female  HENT:  Head: Normocephalic.  Mouth/Throat: Oropharynx is clear and moist.  Eyes:  Conjunctivae and EOM are normal. Pupils are equal, round, and reactive to light. Right eye exhibits no discharge. Left eye exhibits no discharge. No scleral icterus.  Neck: Normal range of motion. Neck supple. No JVD present. Carotid bruit is not present. No thyromegaly present.  Cardiovascular: Normal rate, regular rhythm and normal heart sounds.   Pulmonary/Chest: Effort normal and breath sounds normal. No respiratory distress. She has no wheezes. She has no rales.  Abdominal: Soft. Bowel sounds are normal. She exhibits no distension. There is no tenderness.  Musculoskeletal: She exhibits tenderness. She exhibits no edema.  Lymphadenopathy:    She has no cervical adenopathy.  Neurological: She is alert. She has normal reflexes. She displays no atrophy. No cranial nerve deficit  or sensory deficit. She exhibits normal muscle tone. Coordination and gait abnormal.  Pt has very poor balance -gait is unstable even with wheeled walker   Skin: Skin is warm and dry. No rash noted. No erythema.  Scalp laceration anterior- with 10 staples and large amt of scab and dried blood  Wound edges well approx and no signs of infection  Staples removed   Psychiatric: She has a normal mood and affect.          Assessment & Plan:   Problem List Items Addressed This Visit      Other   Abnormal urine odor    One plus leuk on ua  Sent for cx inst to inc water intake       Relevant Orders   Urinalysis Dipstick (Completed)   Urine culture   History of falling - Primary    Multiple falls with injuries Multifactorial-poor balance and remote hx of cerebral aneurysm as well  Elderly- uses walker at all times Hx of severe back pain -takes occ hydrocodone- but only at night when going to bed and supervised   Ref to neuro for eval and tx       Relevant Orders   Ambulatory referral to Neurology   Laceration of head    8 staples removed in anterior scalp  Large scab with dried blood present-no s/s of  infection Td today ok'd her for a gentle shampoo      Relevant Orders   Td : Tetanus/diphtheria >7yo Preservative  free (Completed)   Poor balance    Multifactorial causing multiple falls  Pt has hx of cerebral aneurysm in the past as well  PT for balance has not helped Ref to neuro for eval and tx       Relevant Orders   Ambulatory referral to Neurology    Other Visit Diagnoses    Need for TD vaccine        Relevant Orders    Td : Tetanus/diphtheria >7yo Preservative  free (Completed)

## 2015-03-26 NOTE — Assessment & Plan Note (Signed)
Multiple falls with injuries Multifactorial-poor balance and remote hx of cerebral aneurysm as well  Elderly- uses walker at all times Hx of severe back pain -takes occ hydrocodone- but only at night when going to bed and supervised   Ref to neuro for eval and tx

## 2015-03-26 NOTE — Assessment & Plan Note (Signed)
Multifactorial causing multiple falls  Pt has hx of cerebral aneurysm in the past as well  PT for balance has not helped Ref to neuro for eval and tx

## 2015-03-26 NOTE — Patient Instructions (Signed)
Please give a urine specimen on the way out  Tetanus shot today  Staples are all out  It is ok to get a gentle shampoo  Stop at check out to discuss a neurology referral

## 2015-03-28 ENCOUNTER — Ambulatory Visit (INDEPENDENT_AMBULATORY_CARE_PROVIDER_SITE_OTHER): Payer: Medicare Other | Admitting: Family Medicine

## 2015-03-28 ENCOUNTER — Encounter: Payer: Self-pay | Admitting: Family Medicine

## 2015-03-28 VITALS — BP 122/80 | HR 87 | Ht 61.0 in | Wt 175.0 lb

## 2015-03-28 DIAGNOSIS — M1990 Unspecified osteoarthritis, unspecified site: Secondary | ICD-10-CM | POA: Diagnosis not present

## 2015-03-28 DIAGNOSIS — S92912A Unspecified fracture of left toe(s), initial encounter for closed fracture: Secondary | ICD-10-CM | POA: Diagnosis not present

## 2015-03-28 DIAGNOSIS — S0191XA Laceration without foreign body of unspecified part of head, initial encounter: Secondary | ICD-10-CM

## 2015-03-28 DIAGNOSIS — R29898 Other symptoms and signs involving the musculoskeletal system: Secondary | ICD-10-CM

## 2015-03-28 DIAGNOSIS — S20211A Contusion of right front wall of thorax, initial encounter: Secondary | ICD-10-CM

## 2015-03-28 DIAGNOSIS — M1991 Primary osteoarthritis, unspecified site: Secondary | ICD-10-CM

## 2015-03-28 DIAGNOSIS — S20219A Contusion of unspecified front wall of thorax, initial encounter: Secondary | ICD-10-CM | POA: Insufficient documentation

## 2015-03-28 LAB — URINE CULTURE: Colony Count: 40000

## 2015-03-28 NOTE — Assessment & Plan Note (Signed)
Healing at this time and patient was put in orthotics. We discussed wearing them slowly over the course of time. Patient is to see if this will help slow stability and help with some of her balance problems. Patient is also be following up with neurology.

## 2015-03-28 NOTE — Patient Instructions (Addendum)
Good to see you Julie [name] is your friend.  I think you will like your orthotics.  For your rib do the topical medicine 2 times daily.  See Nuerology, I think they will be helpful.  Try the orthotics and increase wear time slowly to get used to them.  The knee is doing well and we will continue to monitor See me again in 4 weeks to make sure you are doing well.

## 2015-03-28 NOTE — Progress Notes (Signed)
Patient was fitted for a : standard, cushioned, semi-rigid orthotic. The orthotic was heated and afterward the patient was in a seated position and the orthotic molded. The patient was positioned in subtalar neutral position and 10 degrees of ankle dorsiflexion in a non-weight bearing stance. After completion of molding, patient did have orthotic management which included instructions on acclimating to the orthotics, signs of ill fit as well as care for the orthotic.  I spent 30 minutes with the patient discussing the fit of the orthotics, signs to watch for related to improper fit and adjusting them to her other shoes.   The blank was ground to a stable position for weight bearing. Size: 8 (Igli Active)  Base: Carbon fiber Additional Posting and Padding: The following postings were fitted onto the molded orthotics to help maintain a talar neutral position - Wedge posting for transverse arch:  None    Silicone posting for longitudinal arch: 250/100 Bilaterally  The patient ambulated these, and they were very comfortable and supportive.

## 2015-03-28 NOTE — Progress Notes (Signed)
[name] [place] [place], Julie [name] [id number] Phone: [phone] Subjective:     CC: Left knee pain follow-up Toe fracture follow-up  POE:UMPNTIRWER [name] is a 81 y.o. female coming in with complaint of left knee pain. Patient does have a past medical history significant for left-sided weakness after surgery for an aneurysm. Patient also has a history of polymyalgia rheumatica.   Patient was seen previously for severe medial joint line tenderness of the left knee. Patient does have severe arthritis. Was given a steroid injection 3 weeks ago. Patient continued to do conservative therapy as well as where the medial unloader brace. Patient states that he is doing relatively well. Patient has not notice it as much because she did have a fall. Patient did fall she is having more of the right side pain.  Patient is also having left foot pain. Patient was found to have a second metatarsal stress fracture and was healing slowly but well. Patient was able to transition into her shoes after last visit. Patient states better but thinks it is from not doing as much activity as she was doing previously due to her recent fall.    Past medical history, social, surgical and family history all reviewed in electronic medical record.   Review of Systems: No headache, visual changes, nausea, vomiting, diarrhea, constipation, dizziness, abdominal pain, skin rash, fevers, chills, night sweats, weight loss, swollen lymph nodes, body aches, joint swelling, muscle aches, chest pain, shortness of breath, mood changes.   Objective Blood pressure 122/80, pulse 87, height 5\' 1"  (1.549 m), weight 175 lb (79.379 kg), SpO2 96 %.  General: No apparent distress alert and oriented x3 mood and affect normal, dressed appropriately. Patient's scalp does have a healing laceration on the anterior left side. HEENT: Pupils equal, extraocular movements intact  Respiratory: Patient's  speak in full sentences and does not appear short of breath  Cardiovascular: +1 lower extremity edema, non tender, no erythema  Skin: Warm dry intact with no signs of infection or rash on extremities or on axial skeleton.  Abdomen: Soft nontender tender to palpation over the auxiliary line on the left side over ribs 6 and 7. Neuro: Cranial nerves II through XII are intact, neurovascularly intact in all extremities with 2+ DTRs and 2+ pulses.  Lymph: No lymphadenopathy of posterior or anterior cervical chain or axillae bilaterally.  Gait more antalgic gait and previously MSK:  Mildly tender with near full range of motion and good stability and symmetric strength and tone of shoulders, elbows, wrist, hip,  and ankles bilaterally. Multiple joints have osteophytic changes Knee: Left Normal to inspection with no erythema or effusion or obvious bony abnormalities. Lipoma noted over the medial aspect of the knee no change in size Milliken decrease in medial joint line tenderness ROM full in flexion and extension and lower leg rotation. Ligaments with solid consistent endpoints including ACL, PCL, LCL, MCL. Negative Mcmurray's, Apley's, and Thessalonian tests. Mild painful patellar compression still present Patellar glide with mild crepitus. Patellar and quadriceps tendons unremarkable.  Contralateral knee minimal crepitus but full range of motion in minimally tender to palpation. Mild worsening of left knee compared to previous exam  Bilateral foot exam shows the patient does have severe narrow feet that it does have pes planus bilaterally. Patient does have some mild foot drop but appears on the left foot. Minimal tenderness still over the second metatarsal toe but significantly improved.  Spent  50 minutes with patient  face-to-face and had greater than 50% of counseling including as described above in assessment and plan.   Impression and Recommendations:

## 2015-03-28 NOTE — Progress Notes (Signed)
Pre visit review using our clinic review tool, if applicable. No additional management support is needed unless otherwise documented below in the visit note. 

## 2015-03-28 NOTE — Assessment & Plan Note (Signed)
Healing slowly at this time.

## 2015-03-28 NOTE — Assessment & Plan Note (Signed)
Following up with neurology.

## 2015-03-28 NOTE — Assessment & Plan Note (Signed)
Knee overall seems to be doing relatively well. Patient will continue with the conservative therapy, bracing, home exercises and icing. She will come back and see me again in 4 weeks for further evaluation and treatment.

## 2015-03-28 NOTE — Assessment & Plan Note (Signed)
Discussed deep breathing and HEP, ice and monitor.

## 2015-03-29 ENCOUNTER — Encounter: Payer: Self-pay | Admitting: Neurology

## 2015-03-29 ENCOUNTER — Ambulatory Visit (INDEPENDENT_AMBULATORY_CARE_PROVIDER_SITE_OTHER): Payer: Medicare Other | Admitting: Neurology

## 2015-03-29 VITALS — BP 126/70 | HR 70 | Resp 20 | Ht 61.0 in | Wt 172.9 lb

## 2015-03-29 DIAGNOSIS — R531 Weakness: Secondary | ICD-10-CM | POA: Diagnosis not present

## 2015-03-29 DIAGNOSIS — E8889 Other specified metabolic disorders: Secondary | ICD-10-CM | POA: Diagnosis not present

## 2015-03-29 DIAGNOSIS — R2681 Unsteadiness on feet: Secondary | ICD-10-CM | POA: Diagnosis not present

## 2015-03-29 DIAGNOSIS — G255 Other chorea: Secondary | ICD-10-CM | POA: Diagnosis not present

## 2015-03-29 DIAGNOSIS — R292 Abnormal reflex: Secondary | ICD-10-CM

## 2015-03-29 DIAGNOSIS — W19XXXA Unspecified fall, initial encounter: Secondary | ICD-10-CM

## 2015-03-29 NOTE — Progress Notes (Signed)
Julie [name] was seen today in neurologic consultation at the request of [name], MD.  The consultation is for the evaluation of falls and gait instability.  The records that were made available to me were reviewed.  No one accompanies her back to the room.  Pt states that she has had balance issues for "about a year," although she saw Dr. [name] for the same in 2012.  She states that she is having a lot of falls with no warning beforehand.  She doesn't feel dizzy or lightheaded.  Just about 4-6 weeks ago, she broke her wrist and had to have surgery.  Then a week ago, she fell over the lift chair and she had to have staples in the head.  She estimates that she has a fall a few times a month.  She has used a walker for a few years; she has never had a fall when she was actually using her walker.  Her legs don't feel weak.  No swallowing trouble.  States that her speech has not changed, but her friends all state that she talks "too low."  She has no tremor.  No urinary incontinence but may rarely have an accident.  She denies paresthesias of the feet.  Her sister is 7 years older than she is and she has had some falls as well, but not as frequent as the pt (and states that her sister is blind from macular degeneration).   She denies any abnormal movements, but then says "I think I must be nervous in the hands as they move all the time."  She states that her husband has had to help her dress since 2014, when she broke her left wrist, and she can no longer hook her bra.   She has not driven since aneurysm surgery in 2001 because of peripheral vision loss.    Pt has seen Dr. [name] in 09/2011 for the same and I reviewed his note (only one note I have).  He ordered an MRI of the brain was completed on 10/06/2011.  I only have the report.  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  She apparently had an EMG done of the lower extremities that was reported to be unremarkable.  She has been seen at [facility] for her aneurysm and that is where it was clipped in 2001.  She states that she did well after surgery but did lose peripheral vision on the L after surgery.    ALLERGIES:   Allergies  Allergen Reactions  . Codeine     REACTION: headache and nausea and vomiting    CURRENT MEDICATIONS:  Outpatient Encounter Prescriptions as of 03/29/2015  Medication Sig  . acetaminophen (TYLENOL) 500 MG tablet Take 500 mg by mouth every 6 (six) hours as needed.    . diphenhydramine-acetaminophen (TYLENOL PM) 25-500 MG TABS Take 1 tablet by mouth at bedtime as needed.  . famotidine (PEPCID AC) 10 MG chewable tablet Chew 10 mg by mouth daily as needed.    . Glucosamine Sulfate 1000 MG CAPS Take 2,000 mg by mouth daily.  Marland Kitchen HYDROcodone-acetaminophen (NORCO) 5-325 MG per tablet Take 1 tablet by mouth every 6 (six) hours as needed for moderate pain. Use with caution of sedation  . ondansetron (ZOFRAN) 4 MG tablet Take 1 tablet (4 mg total) by mouth every 8 (eight) hours as needed for  nausea or vomiting.    PAST MEDICAL HISTORY:   Past Medical History  Diagnosis Date  . GERD (gastroesophageal reflux disease)   . Esophagitis   . Duodenitis   . Chronic leg pain   . Trochanteric bursitis   . OA (osteoarthritis) of knee   . TMJ syndrome   . Seborrheic dermatitis   . HLD (hyperlipidemia)   . Colon polyp   . Diverticulosis of colon     internal----Dr. Vira Agar  . Hemorrhoids   . Adenoma   . IBS (irritable bowel syndrome)     with PP diarrhea  . Scoliosis   . Osteopenia   . DDD (degenerative disc disease), cervical     PAST SURGICAL HISTORY:   Past Surgical History  Procedure Laterality Date  . Total abdominal hysterectomy    . Hemorrhoid surgery    . Foot surgery  2010    bilateral hammer toes and "knot"  . Colonoscopy  1/11    polyps-hyperplastic and  adenomatous  . Carpal tunnel release Left 01/18/2013    Procedure: CARPAL TUNNEL RELEASE;  Surgeon: Wynonia Sours, MD;  Location: [place];  Service: Orthopedics;  Laterality: Left;  OSTEOTOMY LEFT DISTAL RADIUS BONE CHIPS CARPAL TUNNEL RELEASE LEFT   . Wrist osteotomy Left 01/18/2013    Procedure: WRIST OSTEOTOMY;  Surgeon: Wynonia Sours, MD;  Location: [place];  Service: Orthopedics;  Laterality: Left;  . Cerebral aneurysm repair  2001  . Open reduction internal fixation (orif) finger with radial bone graft Left 11/01/2013    Procedure: OPEN REDUCTION INTERNAL FIXATION (ORIF) LEFT SMALL FINGER;  Surgeon: Wynonia Sours, MD;  Location: [place];  Service: Orthopedics;  Laterality: Left;  . Open reduction internal fixation (orif) distal radial fracture Right 02/13/2015    Procedure: OPEN REDUCTION INTERNAL FIXATION (ORIF) RIGHT DISTAL RADIUS;  Surgeon: Daryll Brod, MD;  Location: [place];  Service: Orthopedics;  Laterality: Right;    SOCIAL HISTORY:   History   Social History  . Marital Status: Married    Spouse Name: N/A  . Number of Children: N/A  . Years of Education: N/A   Occupational History  . Retired     Radiation protection practitioner   Social History Main Topics  . Smoking status: Former Research scientist (life sciences)  . Smokeless tobacco: Never Used  . Alcohol Use: 0.0 oz/week    0 Standard drinks or equivalent per week     Comment: rare  . Drug Use: No  . Sexual Activity: Not on file     Comment: smoked alittle as teen   Other Topics Concern  . Not on file   Social History Narrative   Retired      Married      Regular exercise          FAMILY HISTORY:   Family Status  Relation Status Death Age  . Mother Deceased     colon cancer  . Father Deceased     complications of ?cataract surgery  . Brother Deceased     3, colon CA, COPD  . Sister Deceased     ? CAD  . Sister Alive     2, macular degeneration,   . Child Alive     2,  healthy    ROS:  A complete 10 system review of systems was obtained and was unremarkable apart from what is mentioned above.  PHYSICAL EXAMINATION:    VITALS:   Filed Vitals:  03/29/15 1222  BP: 126/70  Pulse: 70  Resp: 20  Height: 5\' 1"  (1.549 m)  Weight: 172 lb 14.4 oz (78.427 kg)  SpO2: 97%   NOTE:  Pt had to have assistance taking off/putting on shoes for examination.  Required MA assistance in the bathroom.    GEN:  Normal appears female in no acute distress.  Appears stated age. HEENT:  Normocephalic, atraumatic. The mucous membranes are moist. The superficial temporal arteries are without ropiness or tenderness.  There were no tongue fasciculations. Cardiovascular: Regular rate and rhythm. Lungs: Clear to auscultation bilaterally. Neck/Heme: There are no carotid bruits noted bilaterally.  NEUROLOGICAL: Orientation:  The patient is alert and oriented x 3.  Fund of knowledge is appropriate.  Recent and remote memory intact.  Attention span and concentration normal.  Repeats and names without difficulty. Cranial nerves: There is good facial symmetry. The pupils are equal round and reactive to light bilaterally. Fundoscopic exam reveals clear disc margins bilaterally. Extraocular muscles are intact and there is a left homonymous hemianopsia.   Speech is fluent and clear. She is hypophonic.  Soft palate rises symmetrically and there is no tongue deviation. Hearing is intact to conversational tone. Tone: Tone is good throughout. Sensation: Sensation is intact to light touch and pinprick throughout (facial, trunk, extremities). Vibration decreased at the bilateral big toe. There is no extinction with double simultaneous stimulation. There is no sensory dermatomal level identified. Coordination:  The patient has no difficulty with RAM's or FNF bilaterally. Motor: the patient has a long wrist splint on the right wrist/hand that was placed there after her recent surgery.  She has the  left fourth and fifth digits of the hand buddy taped together after an injury.  She has a knee brace on the left.  Manual motor testing is limited somewhat by pain from recent surgery as well as these orthotics that are on her.  She has decreased grasp on the right, but again this may be from her recent surgery and pain she reports when testing.  She has decreased biceps strength on the right as well, but again reports that it hurts her wrist.  Strength in the deltoid and triceps was good and equal bilaterally.  Strength in the proximal legs is 4+/5 and 5/5 in the distal RLE and 4/5 in the distal LLE (says due to knee pain). DTR's: Deep tendon reflexes are 2+/4 at the bilateral biceps, triceps, brachioradialis, patella and trace at the bilateral achilles.  Plantar response is upgoing (striatal toe) on the left and downgoing on the right. Gait and Station: The patient had extreme difficulty getting up from a seated position without assistance.  She was already sitting on a rather high walker seat.  She reported that the difficulty getting up is because of left knee pain.  Once she turned around and began to use her walker, then the left foot drop is noted.  She drags the left leg mildly, again reporting that it is because of knee pain. Abnormal movements: No tremor.  She does have a choreiform-like movement of the right leg that she does not notice.  Asked her several times if this was purposeful movement, and she denied but also did not notice that it was particularly moving.  Labs:  ceruloplasmin, copper, PTH, ferritin, sedimentation rate, ANA, antiphospholipid antibody, lupus anticoagulant, RPR, B12, antigliadin antibody, cpk, aldolase    IMPRESSION/PLAN  1. Multiple falls of unknown etiology  -complex case.  Has some choreiform movements of the right  leg and will do labs, as above.  However, she really would be quite old for a new diagnosis of HD.  Will hold on checking the HD panel as suspicion  lower for this and because of cost.  -EMG of the lower extremities  -May need repeat neuroimaging but decided to hold off on that as sx's long standing and had imaging in 2012, which was after this started.  -picture made somewhat confusing by hx of old aneurysm that created left homonymous hemianopsia and an upgoing toe on the left (at least I assume that this is with a left upgoing toe is from).  -further recommendations will follow the above testing.

## 2015-03-30 LAB — LUPUS ANTICOAGULANT PANEL
DRVVT: 26.2 secs (ref <42.9)
Lupus Anticoagulant: NOT DETECTED
PTT Lupus Anticoagulant: 38.6 secs (ref 28.0–43.0)

## 2015-03-30 LAB — VITAMIN B12: Vitamin B-12: 262 pg/mL (ref 211–911)

## 2015-03-30 LAB — RPR

## 2015-03-30 LAB — CK: Total CK: 21 U/L (ref 7–177)

## 2015-03-30 LAB — FERRITIN: Ferritin: 36 ng/mL (ref 10–291)

## 2015-03-30 LAB — PARATHYROID HORMONE, INTACT (NO CA): PTH: 58 pg/mL (ref 14–64)

## 2015-03-30 LAB — ANA: Anti Nuclear Antibody(ANA): NEGATIVE

## 2015-03-30 LAB — GLIADIN ANTIBODIES, SERUM
Gliadin IgA: 4 Units (ref <20)
Gliadin IgG: 2 Units (ref <20)

## 2015-03-30 LAB — CARDIOLIPIN ANTIBODIES, IGG, IGM, IGA
Anticardiolipin IgA: 0 APL U/mL (ref <22)
Anticardiolipin IgG: 6 GPL U/mL (ref <23)
Anticardiolipin IgM: 23 MPL U/mL — ABNORMAL HIGH (ref <11)

## 2015-03-30 LAB — SEDIMENTATION RATE: Sed Rate: 19 mm/hr (ref 0–30)

## 2015-03-31 LAB — COPPER, SERUM: Copper: 129 ug/dL (ref 70–175)

## 2015-04-01 LAB — ALDOLASE: Aldolase: 4.5 U/L (ref <=8.1)

## 2015-04-02 LAB — CERULOPLASMIN: Ceruloplasmin: 33 mg/dL (ref 18–53)

## 2015-04-03 LAB — PHOSPHATIDYLSERINE IGG, IGA, IGM
Phosphatidylserine IgG Autoantibodies: 5 U/mL (ref <16)
Phosphatydalserine, IgA: 8 U/mL (ref <20)
Phosphatydalserine, IgM: 34 U/mL — ABNORMAL HIGH (ref <22)

## 2015-04-04 ENCOUNTER — Telehealth: Payer: Self-pay | Admitting: Neurology

## 2015-04-04 DIAGNOSIS — M810 Age-related osteoporosis without current pathological fracture: Secondary | ICD-10-CM | POA: Diagnosis not present

## 2015-04-04 DIAGNOSIS — R0781 Pleurodynia: Secondary | ICD-10-CM | POA: Diagnosis not present

## 2015-04-04 NOTE — Telephone Encounter (Signed)
-----   Message from [place], DO sent at 04/03/2015  4:04 PM EDT ----- Tell pt that most of labs were fine.  B12 a little low so start B12 - 1032mcg daily

## 2015-04-04 NOTE — Telephone Encounter (Signed)
Patient made aware. Will start B12 supplement and call with any questions.

## 2015-04-06 ENCOUNTER — Telehealth: Payer: Self-pay

## 2015-04-06 NOTE — Telephone Encounter (Signed)
Pt received alendronate from Dr Legrand Como. Orthopedist; pt was advised would help strengthen bones but pt is concerned about possible side effects of alendronate; pt has not started med and request cb with Dr Marliss Coots opinion if pt should take med.

## 2015-04-06 NOTE — Telephone Encounter (Signed)
Left voicemail requesting pt to call office 

## 2015-04-06 NOTE — Telephone Encounter (Signed)
There are pros and cons - if side effects develop , then stop it  If she wants to disc in more detail -follow up

## 2015-04-09 NOTE — Telephone Encounter (Signed)
Pt advise of Dr. Marliss Coots comments. Pt said she will think about it and if need be she will call back and schedule a f/u with Dr. Glori Bickers

## 2015-04-18 DIAGNOSIS — S52531D Colles' fracture of right radius, subsequent encounter for closed fracture with routine healing: Secondary | ICD-10-CM | POA: Diagnosis not present

## 2015-04-23 ENCOUNTER — Encounter: Payer: Medicare Other | Admitting: Neurology

## 2015-04-24 ENCOUNTER — Ambulatory Visit (INDEPENDENT_AMBULATORY_CARE_PROVIDER_SITE_OTHER): Payer: Medicare Other | Admitting: Neurology

## 2015-04-24 DIAGNOSIS — W19XXXA Unspecified fall, initial encounter: Secondary | ICD-10-CM | POA: Diagnosis not present

## 2015-04-24 DIAGNOSIS — R531 Weakness: Secondary | ICD-10-CM

## 2015-04-24 DIAGNOSIS — R2681 Unsteadiness on feet: Secondary | ICD-10-CM | POA: Diagnosis not present

## 2015-04-24 NOTE — Procedures (Signed)
[facility]  [address]  [place], [place] [address] Tel: [phone] Fax:  [phone] Test Date:  04/24/2015  Patient: Julie [name] DOB: 14-Mar-1932 Physician: Narda Amber, DO  Sex: Female Height: 5\' 1"  Ref Phys: Alonza Bogus  ID#: [id number] Temp: 33.0C Technician: Jerilynn Mages. Dean   Patient Complaints: This is a 81 year-old female presenting for evaluation of gait abnormality and falls.  NCV & EMG Findings: Extensive electrodiagnostic testing of the left lower extremity and additional studies of the right shows:  1. Bilateral sural and superficial peroneal sensory responses are absent. 2. Bilateral peroneal and tibial motor responses are within normal limits. 3. Sparse chronic motor axon loss changes are seen affecting muscles below the knees, without accompanied active denervation.  Impression: The electrophysiologic findings are most consistent with a generalized sensorimotor polyneuropathy, axon loss in type, affecting the lower extremities, moderate in degree electrically.   ___________________________ Narda Amber, DO    Nerve Conduction Studies Anti Sensory Summary Table   Site NR Peak (ms) Norm Peak (ms) P-T Amp (V) Norm P-T Amp  Left Sup Peroneal Anti Sensory (Ant Lat Mall)  12 cm NR  <4.6  >3  Right Sup Peroneal Anti Sensory (Ant Lat Mall)  12 cm NR  <4.6  >3  Left Sural Anti Sensory (Lat Mall)  Calf NR  <4.6  >3  Right Sural Anti Sensory (Lat Mall)  Calf NR  <4.6  >3   Motor Summary Table   Site NR Onset (ms) Norm Onset (ms) O-P Amp (mV) Norm O-P Amp Site1 Site2 Delta-0 (ms) Dist (cm) Vel (m/s) Norm Vel (m/s)  Left Peroneal Motor (Ext Dig Brev)  Ankle    3.5 <6.0 4.2 >2.5 B Fib Ankle 7.1 33.0 46 >40  B Fib    10.6  3.6  Poplt B Fib 1.8 10.0 56 >40  Poplt    12.4  3.2         Right Peroneal Motor (Ext Dig Brev)  Ankle    3.0 <6.0 2.7 >2.5 B Fib Ankle 6.7 28.0 42 >40  B Fib    9.7  2.4  Poplt B Fib 2.1 10.0 48 >40  Poplt    11.8   2.2         Left Tibial Motor (Abd Hall Brev)  Ankle    4.1 <6.0 5.8 >4 Knee Ankle 9.5 40.0 42 >40  Knee    13.6  4.5         Right Tibial Motor (Abd Hall Brev)  Ankle    3.4 <6.0 9.4 >4 Knee Ankle 8.6 38.0 44 >40  Knee    12.0  6.0          H Reflex Studies   NR H-Lat (ms) Lat Norm (ms) L-R H-Lat (ms)  Left Tibial (Gastroc)     33.88 <35 2.45  Right Tibial (Gastroc)     36.33 <35 2.45   EMG   Side Muscle Ins Act Fibs Psw Fasc Number Recrt Dur Dur. Amp Amp. Poly Poly. Comment  Left AntTibialis Nml Nml Nml Nml 1- Mod-R Some 1+ Nml Nml Nml Nml N/A  Left Gastroc Nml Nml Nml Nml 1- Rapid Some 1+ Nml Nml Nml Nml N/A  Left Flex Dig Long Nml Nml Nml Nml 1- Rapid Some 1+ Nml Nml Nml Nml N/A  Left RectFemoris Nml Nml Nml Nml Nml Nml Nml Nml Nml Nml Nml Nml N/A  Left GluteusMed Nml Nml Nml Nml Nml Nml Nml Nml  Nml Nml Nml Nml N/A  Left BicepsFemS Nml Nml Nml Nml Nml Nml Nml Nml Nml Nml Nml Nml N/A  Right AntTibialis Nml Nml Nml Nml 1- Mod-R Some 1+ Nml Nml Nml Nml N/A  Right Gastroc Nml Nml Nml Nml 1- Rapid Some 1+ Nml Nml Nml Nml N/A  Right RectFemoris Nml Nml Nml Nml Nml Nml Nml Nml Nml Nml Nml Nml N/A      Waveforms:

## 2015-04-25 ENCOUNTER — Ambulatory Visit (INDEPENDENT_AMBULATORY_CARE_PROVIDER_SITE_OTHER): Payer: Medicare Other | Admitting: Family Medicine

## 2015-04-25 ENCOUNTER — Encounter: Payer: Self-pay | Admitting: Family Medicine

## 2015-04-25 ENCOUNTER — Telehealth: Payer: Self-pay | Admitting: Neurology

## 2015-04-25 VITALS — BP 126/78 | HR 78 | Ht 61.0 in | Wt 172.0 lb

## 2015-04-25 DIAGNOSIS — W19XXXA Unspecified fall, initial encounter: Secondary | ICD-10-CM

## 2015-04-25 DIAGNOSIS — M1990 Unspecified osteoarthritis, unspecified site: Secondary | ICD-10-CM

## 2015-04-25 DIAGNOSIS — S92912A Unspecified fracture of left toe(s), initial encounter for closed fracture: Secondary | ICD-10-CM | POA: Diagnosis not present

## 2015-04-25 DIAGNOSIS — M1991 Primary osteoarthritis, unspecified site: Secondary | ICD-10-CM

## 2015-04-25 DIAGNOSIS — G255 Other chorea: Secondary | ICD-10-CM

## 2015-04-25 NOTE — Telephone Encounter (Signed)
-----   Message from Angie, DO sent at 04/25/2015  8:32 AM EDT ----- Please let pt know that EMG showed peripheral neuropathy (damage to nerves that control balance and sensation in legs/feet).  May explain some of falls.  Could still do MRI brain to make sure not missing.  Agreeable?

## 2015-04-25 NOTE — Progress Notes (Signed)
Pre visit review using our clinic review tool, if applicable. No additional management support is needed unless otherwise documented below in the visit note. 

## 2015-04-25 NOTE — Assessment & Plan Note (Signed)
She does have severe arthritis of the knee. We discussed icing regimen and home exercises. We discussed continuing the bracing and we discussed the possibility of a custom brace which patient declined at this time because she is awaiting an MRI of her brain secondary to her history of falls. Patient is continuing to follow up with neurology. Patient knows that we can repeat injection if necessary and patient will follow-up if she decides to do this. We've also discussed before about viscous supplementation. Patient would like to wait and see if she makes any improvement.  Spent  25 minutes with patient face-to-face and had greater than 50% of counseling including as described above in assessment and plan.

## 2015-04-25 NOTE — Progress Notes (Signed)
  [name] [address] [place], [place] [address] Phone: [phone] Subjective:     CC: Left knee pain follow-up Toe fracture follow-up  AYT:KZSWFUXNAT [name] is a 81 y.o. female coming in with complaint of left knee pain. Patient does have a past medical history significant for left-sided weakness after surgery for an aneurysm. Patient also has a history of polymyalgia rheumatica.    Patient was seen previously for severe medial joint line tenderness of the left knee. Patient does have severe arthritis. Was given a steroid injection multiple months ago at this time. Patient continues with the brace. Continues to give her difficulty if she walks more than 200 feet. Patient though states that it is not stopping her from any activity.  Patient is also having left foot pain. Vision actually states the left pain is significantly better with patient being in custom orthotics. No longer having any pain.    Past medical history, social, surgical and family history all reviewed in electronic medical record.   Review of Systems: No headache, visual changes, nausea, vomiting, diarrhea, constipation, dizziness, abdominal pain, skin rash, fevers, chills, night sweats, weight loss, swollen lymph nodes, body aches, joint swelling, muscle aches, chest pain, shortness of breath, mood changes.   Objective Blood pressure 126/78, pulse 78, height 5\' 1"  (1.549 m), weight 172 lb (78.019 kg), SpO2 96 %.  General: No apparent distress alert and oriented x3 mood and affect normal, dressed appropriately. Patient's scalp does have a healing laceration on the anterior left side. HEENT: Pupils equal, extraocular movements intact  Respiratory: Patient's speak in full sentences and does not appear short of breath  Cardiovascular: +1 lower extremity edema, non tender, no erythema  Skin: Warm dry intact with no signs of infection or rash on extremities or on axial skeleton.    Abdomen: Soft nontender tender to palpation over the auxiliary line on the left side over ribs 6 and 7. Neuro: Cranial nerves II through XII are intact, neurovascularly intact in all extremities with 2+ DTRs and 2+ pulses.  Lymph: No lymphadenopathy of posterior or anterior cervical chain or axillae bilaterally.  Gait more antalgic gait and previously MSK:  Mildly tender with near full range of motion and good stability and symmetric strength and tone of shoulders, elbows, wrist, hip,  and ankles bilaterally. Multiple joints have osteophytic changes Knee: Left Normal to inspection with no erythema or effusion or obvious bony abnormalities. Lipoma noted over the medial aspect of the knee no change in size Milliken decrease in medial joint line tenderness ROM full in flexion and extension and lower leg rotation. Ligaments with solid consistent endpoints including ACL, PCL, LCL, MCL. Negative Mcmurray's, Apley's, and Thessalonian tests. Mild painful patellar compression still present Patellar glide with mild crepitus. Patellar and quadriceps tendons unremarkable.  Contralateral knee minimal crepitus but full range of motion in minimally tender to palpation. No change from previous exam.  Bilateral foot exam shows the patient does have severe narrow feet that it does have pes planus bilaterally. Patient does have some mild foot drop but appears on the left foot. Nontender on exam     Impression and Recommendations:

## 2015-04-25 NOTE — Patient Instructions (Signed)
Good to see you Overall you are walking better Ice is your friend Continue the brace and if you want we can get you a custom brace If any worsening of pain and if you want another injection give me a call.  We filled out the handicap form today You know where I am if you need me.

## 2015-04-25 NOTE — Telephone Encounter (Signed)
Patient made aware of results. She is okay with having MR Brain. She will need an open MR machine and prefers afternoon appts on any day but Friday. Aware I will call her back with appt at [place].   Dr. Carles Collet - MR Brain with or without contrast? Diagnosis?

## 2015-04-25 NOTE — Assessment & Plan Note (Signed)
Improved

## 2015-04-26 ENCOUNTER — Telehealth: Payer: Self-pay | Admitting: Neurology

## 2015-04-26 ENCOUNTER — Telehealth: Payer: Self-pay | Admitting: Family Medicine

## 2015-04-26 NOTE — Telephone Encounter (Signed)
LMOM with Kathlee Nations letting her know I just scheduled appt and no authorization has been obtained yet. We will work on it and get it to them.

## 2015-04-26 NOTE — Telephone Encounter (Signed)
Patient scheduled at [facility] on 05/01/2015 at 2:00 pm. Patient made aware. Order faxed with confirmation received.

## 2015-04-26 NOTE — Telephone Encounter (Signed)
Pt decided that she would like a custom brace. i advised her we will get that set up& our [employer] rep will call her.

## 2015-04-26 NOTE — Telephone Encounter (Signed)
Please call patient. She was in yesterday and she was supposed to make a decision about something. She wanted to discuss this with you

## 2015-04-26 NOTE — Telephone Encounter (Signed)
Kathlee Nations from [place] called in regards to an authorization for an MRI and would like a call back [phone]/Dawn

## 2015-04-26 NOTE — Telephone Encounter (Signed)
With and without.  Try dx of chorea, falls

## 2015-05-01 DIAGNOSIS — I671 Cerebral aneurysm, nonruptured: Secondary | ICD-10-CM | POA: Diagnosis not present

## 2015-05-01 DIAGNOSIS — G255 Other chorea: Secondary | ICD-10-CM | POA: Diagnosis not present

## 2015-05-01 DIAGNOSIS — I6781 Acute cerebrovascular insufficiency: Secondary | ICD-10-CM | POA: Diagnosis not present

## 2015-05-04 ENCOUNTER — Telehealth: Payer: Self-pay | Admitting: Neurology

## 2015-05-04 NOTE — Telephone Encounter (Signed)
Reviewed MRI brain.  Mod small vessel disease.  Significant artifact from prior aneurysm clipping.  Jade, let pt know that I did not see anything new or unexpected on her MRI brain

## 2015-05-04 NOTE — Telephone Encounter (Signed)
It has been a few months since I have seen her.  Make f/u appt in next few weeks to discuss

## 2015-05-04 NOTE — Telephone Encounter (Signed)
Follow up appt made with patient.  

## 2015-05-04 NOTE — Telephone Encounter (Signed)
Patient made aware. She would like to know what she needs to do next. Please advise.

## 2015-05-09 DIAGNOSIS — M1712 Unilateral primary osteoarthritis, left knee: Secondary | ICD-10-CM | POA: Diagnosis not present

## 2015-05-09 DIAGNOSIS — M1990 Unspecified osteoarthritis, unspecified site: Secondary | ICD-10-CM | POA: Diagnosis not present

## 2015-05-09 DIAGNOSIS — S92912A Unspecified fracture of left toe(s), initial encounter for closed fracture: Secondary | ICD-10-CM | POA: Diagnosis not present

## 2015-05-15 ENCOUNTER — Ambulatory Visit (INDEPENDENT_AMBULATORY_CARE_PROVIDER_SITE_OTHER): Payer: Medicare Other | Admitting: Neurology

## 2015-05-15 ENCOUNTER — Encounter: Payer: Self-pay | Admitting: Neurology

## 2015-05-15 VITALS — BP 126/68 | HR 72 | Resp 18 | Ht 61.0 in | Wt 179.3 lb

## 2015-05-15 DIAGNOSIS — G255 Other chorea: Secondary | ICD-10-CM | POA: Diagnosis not present

## 2015-05-15 DIAGNOSIS — G609 Hereditary and idiopathic neuropathy, unspecified: Secondary | ICD-10-CM

## 2015-05-15 DIAGNOSIS — E538 Deficiency of other specified B group vitamins: Secondary | ICD-10-CM

## 2015-05-15 NOTE — Progress Notes (Signed)
Julie [name] was seen today in neurologic consultation at the request of [name], MD.  The consultation is for the evaluation of falls and gait instability.  The records that were made available to me were reviewed.  No one accompanies her back to the room.  Pt states that she has had balance issues for "about a year," although she saw Dr. [name] for the same in 2012.  She states that she is having a lot of falls with no warning beforehand.  She doesn't feel dizzy or lightheaded.  Just about 4-6 weeks ago, she broke her wrist and had to have surgery.  Then a week ago, she fell over the lift chair and she had to have staples in the head.  She estimates that she has a fall a few times a month.  She has used a walker for a few years; she has never had a fall when she was actually using her walker.  Her legs don't feel weak.  No swallowing trouble.  States that her speech has not changed, but her friends all state that she talks "too low."  She has no tremor.  No urinary incontinence but may rarely have an accident.  She denies paresthesias of the feet.  Her sister is 14 years older than she is and she has had some falls as well, but not as frequent as the pt (and states that her sister is blind from macular degeneration).   She denies any abnormal movements, but then says "I think I must be nervous in the hands as they move all the time."  She states that her husband has had to help her dress since 2014, when she broke her left wrist, and she can no longer hook her bra.   She has not driven since aneurysm surgery in 2001 because of peripheral vision loss.    Pt has seen Dr. [name] in 09/2011 for the same and I reviewed his note (only one note I have).  He ordered an MRI of the brain was completed on 10/06/2011.  I only have the report.  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  She apparently had an EMG done of the lower extremities that was reported to be unremarkable.  She has been seen at [facility] for her aneurysm and that is where it was clipped in 2001.  She states that she did well after surgery but did lose peripheral vision on the L after surgery.    05/15/15 update:  Pt seen for f/u.  Has had multiple tests done.  EMG demonstrated moderate PN.  MRI brain done at [facility] demonstrating artifact secondary to prior aneurysm clipping and moderate small vessel disease.  She had multiple lab tests done.  Her B12 was a bit low at 262 and she was told to start an oral supplement.  She had other testing including copper, ceruloplasmin, ESR (19), ANA, ferritin, rpr (neg), cpk and aldolase and all were normal.  Celiac panel was normal.  She saw Charlann Boxer and just had a custom orthotic made for mild foot drop.  She has had no falls since last visit.  Is using her walker most of the time.  She does not notice the movements of the right leg.  ALLERGIES:   Allergies  Allergen Reactions  . Codeine     REACTION: headache and nausea and  vomiting    CURRENT MEDICATIONS:  Outpatient Encounter Prescriptions as of 05/15/2015  Medication Sig  . acetaminophen (TYLENOL) 500 MG tablet Take 500 mg by mouth every 6 (six) hours as needed.    . diphenhydramine-acetaminophen (TYLENOL PM) 25-500 MG TABS Take 1 tablet by mouth at bedtime as needed.  . famotidine (PEPCID AC) 10 MG chewable tablet Chew 10 mg by mouth daily as needed.    . Glucosamine Sulfate 1000 MG CAPS Take 2,000 mg by mouth daily.  Marland Kitchen HYDROcodone-acetaminophen (NORCO) 5-325 MG per tablet Take 1 tablet by mouth every 6 (six) hours as needed for moderate pain. Use with caution of sedation (Patient not taking: Reported on 05/15/2015)  . ondansetron (ZOFRAN) 4 MG tablet Take 1 tablet (4 mg total) by mouth every 8 (eight) hours as needed for nausea or vomiting. (Patient not taking: Reported on 05/15/2015)   No  facility-administered encounter medications on file as of 05/15/2015.    PAST MEDICAL HISTORY:   Past Medical History  Diagnosis Date  . GERD (gastroesophageal reflux disease)   . Esophagitis   . Duodenitis   . Chronic leg pain   . Trochanteric bursitis   . OA (osteoarthritis) of knee   . TMJ syndrome   . Seborrheic dermatitis   . HLD (hyperlipidemia)   . Colon polyp   . Diverticulosis of colon     internal----Dr. Vira Agar  . Hemorrhoids   . Adenoma   . IBS (irritable bowel syndrome)     with PP diarrhea  . Scoliosis   . Osteopenia   . DDD (degenerative disc disease), cervical     PAST SURGICAL HISTORY:   Past Surgical History  Procedure Laterality Date  . Total abdominal hysterectomy    . Hemorrhoid surgery    . Foot surgery  2010    bilateral hammer toes and "knot"  . Colonoscopy  1/11    polyps-hyperplastic and adenomatous  . Carpal tunnel release Left 01/18/2013    Procedure: CARPAL TUNNEL RELEASE;  Surgeon: Wynonia Sours, MD;  Location: [place];  Service: Orthopedics;  Laterality: Left;  OSTEOTOMY LEFT DISTAL RADIUS BONE CHIPS CARPAL TUNNEL RELEASE LEFT   . Wrist osteotomy Left 01/18/2013    Procedure: WRIST OSTEOTOMY;  Surgeon: Wynonia Sours, MD;  Location: [place];  Service: Orthopedics;  Laterality: Left;  . Cerebral aneurysm repair  2001  . Open reduction internal fixation (orif) finger with radial bone graft Left 11/01/2013    Procedure: OPEN REDUCTION INTERNAL FIXATION (ORIF) LEFT SMALL FINGER;  Surgeon: Wynonia Sours, MD;  Location: [place];  Service: Orthopedics;  Laterality: Left;  . Open reduction internal fixation (orif) distal radial fracture Right 02/13/2015    Procedure: OPEN REDUCTION INTERNAL FIXATION (ORIF) RIGHT DISTAL RADIUS;  Surgeon: Daryll Brod, MD;  Location: [place];  Service: Orthopedics;  Laterality: Right;    SOCIAL HISTORY:   History   Social History  . Marital Status:  Married    Spouse Name: N/A  . Number of Children: N/A  . Years of Education: N/A   Occupational History  . Retired     Radiation protection practitioner   Social History Main Topics  . Smoking status: Former Research scientist (life sciences)  . Smokeless tobacco: Never Used  . Alcohol Use: 0.0 oz/week    0 Standard drinks or equivalent per week     Comment: rare  . Drug Use: No  . Sexual Activity: Not on file     Comment:  smoked alittle as teen   Other Topics Concern  . Not on file   Social History Narrative   Retired      Married      Regular exercise          FAMILY HISTORY:   Family Status  Relation Status Death Age  . Mother Deceased     colon cancer  . Father Deceased     complications of ?cataract surgery  . Brother Deceased     3, colon CA, COPD  . Sister Deceased     ? CAD  . Sister Alive     2, macular degeneration,   . Child Alive     2, healthy    ROS:  A complete 10 system review of systems was obtained and was unremarkable apart from what is mentioned above.  PHYSICAL EXAMINATION:    VITALS:   Filed Vitals:   05/15/15 1434  BP: 126/68  Pulse: 72  Resp: 18  Height: _0  (1.549 m)  Weight: 179 lb 4.8 oz (81.33 kg)   Virtually 100% of the visit was spent in counseling today.  GEN:  Normal appears female in no acute distress.  Appears stated age.   NEUROLOGICAL: Orientation:  The patient is alert and oriented x 3.  Cranial nerves: There is good facial symmetry. . Extraocular muscles are intact and there is a left homonymous hemianopsia.   Speech is fluent and clear. .  Soft palate rises symmetrically and there is no tongue deviation. Hearing is intact to conversational tone. Tone: Tone is good throughout. Coordination:  The patient has no difficulty with RAM's or FNF bilaterally. Gait and Station: The patient ambulates with a walker.  She has her foot brace on and does well with this.   Abnormal movements: No tremor.  She does have a choreiform-like movement of the right leg that  she does not notice.  Asked her several times if this was purposeful movement, and she denied but also did not notice that it was particularly moving.   IMPRESSION/PLAN  1.  Peripheral neuropathy  -Is moderate in nature and certainly could explain her falls.  She and I discussed the nature of peripheral neuropathy.  She has a mild B12 deficiency and is currently on oral replacement.  This will need rechecked in a few months.  She can have that done at her primary care physician office, as "I am tired of going to doctors."  She and I discussed safety associated with peripheral neuropathy.  She needs to use her walker at all times.  Since she has been doing this, she has not had any falls. 2.  Abnormal movements of the right leg.  -She still does have a choreiform movement of the right leg.  She does not notice this.  She has had an extensive workup that was unremarkable.  The only thing that I did not do was a Huntington's panel.  She is a little old to develop it at this age, but it could be.  She and I discussed ordering this and discussed to the cost of this test, which can be quite expensive.  She asked me to go ahead and give her the form and she will look into it.  If this movement progresses, she probably should have a lumbar puncture to rule out a paraneoplastic etiology.  At this point in time, because she does not notice it, she is not sure that she wants to go that far.  She  is going to take the chorea labs to [facility] first and then let me know how she wants to proceed.

## 2015-05-16 DIAGNOSIS — S52531D Colles' fracture of right radius, subsequent encounter for closed fracture with routine healing: Secondary | ICD-10-CM | POA: Diagnosis not present

## 2015-05-21 ENCOUNTER — Telehealth: Payer: Self-pay | Admitting: Neurology

## 2015-05-21 NOTE — Telephone Encounter (Signed)
Pt states that she is to take a blood test and she has some questions. She got a big box in the mail about the test and does not understand  Please call [phone]

## 2015-05-21 NOTE — Telephone Encounter (Signed)
I informed patient that I would check into this test to see if insurance will cover it and get back to her when I have an answer.

## 2015-05-21 NOTE — Telephone Encounter (Signed)
Julie [name], please call this patient back on behalf of Dr. Carles Collet / Luvenia Starch  Venida Jarvis S.

## 2015-05-23 NOTE — Telephone Encounter (Signed)
Do you know if this is the Huntington's test.  It was the only thing mentioned in your last note.

## 2015-05-23 NOTE — Telephone Encounter (Signed)
Yes, but no idea why she would get a box in the mail for it?  Please don't use the words huntingtons with her as don't want to scare her and don't think she really has this.  Its an [facility] test

## 2015-05-29 NOTE — Telephone Encounter (Signed)
Pt called back in regards to the box she received in the mail for a test she is to do/Dawn CB# [phone]

## 2015-05-29 NOTE — Telephone Encounter (Signed)
I have contacted Paige with [facility] and she is checking into this for me.

## 2015-05-29 NOTE — Telephone Encounter (Signed)
I spoke with Julie [name] and she said that their billing department will be contacting the patient today.  Patient notified.

## 2015-05-29 NOTE — Telephone Encounter (Signed)
See Dr. Doristine Devoid response, was pt ever contacted regarding this? Please update documentation and close encounter. / Sherri S.

## 2015-06-05 ENCOUNTER — Ambulatory Visit (INDEPENDENT_AMBULATORY_CARE_PROVIDER_SITE_OTHER): Payer: Medicare Other | Admitting: Neurology

## 2015-06-05 ENCOUNTER — Encounter: Payer: Self-pay | Admitting: Neurology

## 2015-06-05 VITALS — BP 120/62 | HR 100 | Ht 63.0 in | Wt 178.0 lb

## 2015-06-05 DIAGNOSIS — G255 Other chorea: Secondary | ICD-10-CM | POA: Diagnosis not present

## 2015-06-05 DIAGNOSIS — G609 Hereditary and idiopathic neuropathy, unspecified: Secondary | ICD-10-CM | POA: Diagnosis not present

## 2015-06-05 NOTE — Progress Notes (Signed)
Julie [name] was seen today in neurologic consultation at the request of [name], MD.  The consultation is for the evaluation of falls and gait instability.  The records that were made available to me were reviewed.  No one accompanies her back to the room.  Pt states that she has had balance issues for "about a year," although she saw Dr. [name] for the same in 2012.  She states that she is having a lot of falls with no warning beforehand.  She doesn't feel dizzy or lightheaded.  Just about 4-6 weeks ago, she broke her wrist and had to have surgery.  Then a week ago, she fell over the lift chair and she had to have staples in the head.  She estimates that she has a fall a few times a month.  She has used a walker for a few years; she has never had a fall when she was actually using her walker.  Her legs don't feel weak.  No swallowing trouble.  States that her speech has not changed, but her friends all state that she talks "too low."  She has no tremor.  No urinary incontinence but may rarely have an accident.  She denies paresthesias of the feet.  Her sister is 81 years older than she is and she has had some falls as well, but not as frequent as the pt (and states that her sister is blind from macular degeneration).   She denies any abnormal movements, but then says "I think I must be nervous in the hands as they move all the time."  She states that her husband has had to help her dress since 2014, when she broke her left wrist, and she can no longer hook her bra.   She has not driven since aneurysm surgery in 2001 because of peripheral vision loss.    Pt has seen Dr. [name] in 09/2011 for the same and I reviewed his note (only one note I have).  He ordered an MRI of the brain was completed on 10/06/2011.  I only have the report.  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  She apparently had an EMG done of the lower extremities that was reported to be unremarkable.  She has been seen at [facility] for her aneurysm and that is where it was clipped in 2001.  She states that she did well after surgery but did lose peripheral vision on the L after surgery.    05/15/15 update:  Pt seen for f/u.  Has had multiple tests done.  EMG demonstrated moderate PN.  MRI brain done at [facility] demonstrating artifact secondary to prior aneurysm clipping and moderate small vessel disease.  She had multiple lab tests done.  Her B12 was a bit low at 262 and she was told to start an oral supplement.  She had other testing including copper, ceruloplasmin, ESR (19), ANA, ferritin, rpr (neg), cpk and aldolase and all were normal.  Celiac panel was normal.  She saw Charlann Boxer and just had a custom orthotic made for mild foot drop.  She has had no falls since last visit.  Is using her walker most of the time.  She does not notice the movements of the right leg.  06/05/15 update:  The patient follows up today unexpectedly.  Last visit, I had given her a form to  have lab work done for chorea of the right leg.  This was an [facility] panel.  She talked to [facility], received the packet and her copay was $250.  She googled HD and wanted to talk further.  States that if there is no cure, she really wants to go no further with testing.  Plans to go to [place] in [place] with church and worries about falls/getting on bus.  ALLERGIES:   Allergies  Allergen Reactions  . Codeine     REACTION: headache and nausea and vomiting    CURRENT MEDICATIONS:  Outpatient Encounter Prescriptions as of 06/05/2015  Medication Sig  . acetaminophen (TYLENOL) 500 MG tablet Take 500 mg by mouth every 6 (six) hours as needed.    . diphenhydramine-acetaminophen (TYLENOL PM) 25-500 MG TABS Take 1 tablet by mouth at bedtime as needed.  . famotidine (PEPCID AC) 10 MG chewable tablet Chew 10 mg by mouth daily as  needed.    . Glucosamine Sulfate 1000 MG CAPS Take 2,000 mg by mouth daily.  . HYDROcodone-acetaminophen (NORCO) 5-325 MG per tablet Take 1 tablet by mouth every 6 (six) hours as needed for moderate pain. Use with caution of sedation  . ondansetron (ZOFRAN) 4 MG tablet Take 1 tablet (4 mg total) by mouth every 8 (eight) hours as needed for nausea or vomiting.   No facility-administered encounter medications on file as of 06/05/2015.    PAST MEDICAL HISTORY:   Past Medical History  Diagnosis Date  . GERD (gastroesophageal reflux disease)   . Esophagitis   . Duodenitis   . Chronic leg pain   . Trochanteric bursitis   . OA (osteoarthritis) of knee   . TMJ syndrome   . Seborrheic dermatitis   . HLD (hyperlipidemia)   . Colon polyp   . Diverticulosis of colon     internal----Dr. Elliott  . Hemorrhoids   . Adenoma   . IBS (irritable bowel syndrome)     with PP diarrhea  . Scoliosis   . Osteopenia   . DDD (degenerative disc disease), cervical     PAST SURGICAL HISTORY:   Past Surgical History  Procedure Laterality Date  . Total abdominal hysterectomy    . Hemorrhoid surgery    . Foot surgery  2010    bilateral hammer toes and "knot"  . Colonoscopy  1/11    polyps-hyperplastic and adenomatous  . Carpal tunnel release Left 01/18/2013    Procedure: CARPAL TUNNEL RELEASE;  Surgeon: Gary R Kuzma, MD;  Location: [facility];  Service: Orthopedics;  Laterality: Left;  OSTEOTOMY LEFT DISTAL RADIUS BONE CHIPS CARPAL TUNNEL RELEASE LEFT   . Wrist osteotomy Left 01/18/2013    Procedure: WRIST OSTEOTOMY;  Surgeon: Gary R Kuzma, MD;  Location: [facility];  Service: Orthopedics;  Laterality: Left;  . Cerebral aneurysm repair  2001  . Open reduction internal fixation (orif) finger with radial bone graft Left 11/01/2013    Procedure: OPEN REDUCTION INTERNAL FIXATION (ORIF) LEFT SMALL FINGER;  Surgeon: Gary R Kuzma, MD;  Location: [facility];  Service:  Orthopedics;  Laterality: Left;  . Open reduction internal fixation (orif) distal radial fracture Right 02/13/2015    Procedure: OPEN REDUCTION INTERNAL FIXATION (ORIF) RIGHT DISTAL RADIUS;  Surgeon: Gary Kuzma, MD;  Location: [facility];  Service: Orthopedics;  Laterality: Right;    SOCIAL HISTORY:   History   Social History  . Marital Status: Married    Spouse Name: N/A  .   Number of Children: N/A  . Years of Education: N/A   Occupational History  . Retired     Radiation protection practitioner   Social History Main Topics  . Smoking status: Former Research scientist (life sciences)  . Smokeless tobacco: Never Used  . Alcohol Use: 0.0 oz/week    0 Standard drinks or equivalent per week     Comment: rare  . Drug Use: No  . Sexual Activity: Not on file     Comment: smoked alittle as teen   Other Topics Concern  . Not on file   Social History Narrative   Retired      Married      Regular exercise          FAMILY HISTORY:   Family Status  Relation Status Death Age  . Mother Deceased     colon cancer  . Father Deceased     complications of ?cataract surgery  . Brother Deceased     3, colon CA, COPD  . Sister Deceased     ? CAD  . Sister Alive     2, macular degeneration,   . Child Alive     2, healthy    ROS:  A complete 10 system review of systems was obtained and was unremarkable apart from what is mentioned above.  PHYSICAL EXAMINATION:    VITALS:   Filed Vitals:   06/05/15 1312  BP: 120/62  Pulse: 100  Height: 5' 3" (1.6 m)  Weight: 178 lb (80.74 kg)   Virtually 100% of the visit was spent in counseling today.  GEN:  Normal appears female in no acute distress.  Appears stated age.   NEUROLOGICAL: Orientation:  The patient is alert and oriented x 3.  Cranial nerves: There is good facial symmetry. . Extraocular muscles are intact and there is a left homonymous hemianopsia.   Speech is fluent and clear. .  Soft palate rises symmetrically and there is no tongue deviation. Hearing  is intact to conversational tone. Tone: Tone is good throughout. Coordination:  The patient has no difficulty with RAM's or FNF bilaterally. Gait and Station: The patient ambulates with a walker.  She has her foot brace on and does well with this.   Abnormal movements: No tremor.  She does have a choreiform-like movement of the right leg that she does not notice.    IMPRESSION/PLAN  1.  Peripheral neuropathy  -Is moderate in nature and certainly could explain her falls.  She and I again discussed the nature of peripheral neuropathy.  She has a mild B12 deficiency and is currently on oral replacement.  This will need rechecked in a few months.  She can have that done at her primary care physician office, as "I am tired of going to doctors."  She and I discussed safety associated with peripheral neuropathy.  She needs to use her walker at all times.  Since she has been doing this, she has not had any falls.  -discussed using scooter when going out of town.  These can be rented 2.  Abnormal movements of the right leg.  -After a long discussion with her today, she ultimately stated that she really does not want to have lab work done for Huntington's chorea, nor does she want to be worked up for paraneoplastic causes of chorea.  She understands completely the risks of doing this.  Much greater than 50% of the visit was spent in counseling.  Total visit time was 25 minutes.   3.  I  told her that if things get worse, I definitely want to see her back sooner rather than later, but would like to at least see her on a yearly basis.

## 2015-06-26 DIAGNOSIS — H53462 Homonymous bilateral field defects, left side: Secondary | ICD-10-CM | POA: Diagnosis not present

## 2015-06-27 DIAGNOSIS — M1812 Unilateral primary osteoarthritis of first carpometacarpal joint, left hand: Secondary | ICD-10-CM | POA: Diagnosis not present

## 2015-06-27 DIAGNOSIS — M1811 Unilateral primary osteoarthritis of first carpometacarpal joint, right hand: Secondary | ICD-10-CM | POA: Diagnosis not present

## 2015-07-11 DIAGNOSIS — H2511 Age-related nuclear cataract, right eye: Secondary | ICD-10-CM | POA: Diagnosis not present

## 2015-07-16 ENCOUNTER — Encounter: Payer: Self-pay | Admitting: Family Medicine

## 2015-07-16 ENCOUNTER — Ambulatory Visit (INDEPENDENT_AMBULATORY_CARE_PROVIDER_SITE_OTHER): Payer: Medicare Other | Admitting: Family Medicine

## 2015-07-16 VITALS — BP 130/68 | HR 75 | Temp 98.4°F | Ht 61.0 in | Wt 181.0 lb

## 2015-07-16 DIAGNOSIS — E538 Deficiency of other specified B group vitamins: Secondary | ICD-10-CM | POA: Insufficient documentation

## 2015-07-16 DIAGNOSIS — G255 Other chorea: Secondary | ICD-10-CM | POA: Diagnosis not present

## 2015-07-16 DIAGNOSIS — Z9181 History of falling: Secondary | ICD-10-CM

## 2015-07-16 DIAGNOSIS — G629 Polyneuropathy, unspecified: Secondary | ICD-10-CM | POA: Diagnosis not present

## 2015-07-16 MED ORDER — CYANOCOBALAMIN 1000 MCG/ML IJ SOLN
1000.0000 ug | Freq: Once | INTRAMUSCULAR | Status: AC
  Administered 2015-07-16: 1000 ug via INTRAMUSCULAR

## 2015-07-16 NOTE — Assessment & Plan Note (Signed)
Ongoing s/p neuro w/u - R leg only currently  Adding to fall risk  Pt declines further w/u including w/u for Huntington's Chorea   Disc safety Has help at home  Disc use of walker at all times  F/u neuro prn

## 2015-07-16 NOTE — Assessment & Plan Note (Addendum)
Multifactorial with R leg chorea and PN and generally bad balance Falls continue despite PT for vestibular rehab Also severe R knee pain - surgery may help Disc safety in detail  Continue neurology follow up as needed Get the bars put into your shower for safety Look into getting a life alert/ medic alert button  Make sure you have all the help you need  Use walker at all times  Do think about getting your knee fixed  B12 shot today

## 2015-07-16 NOTE — Patient Instructions (Addendum)
Continue neurology follow up as needed Get the bars put into your shower for safety Look into getting a life alert/ medic alert button  Make sure you have all the help you need  Use walker at all times  Do think about getting your knee fixed  B12 shot today  Schedule 3 more B12 shots a week apart   Take 1000 mcg daily of vitamin B12   Schedule lab in 2-3 months for B12 level

## 2015-07-16 NOTE — Assessment & Plan Note (Signed)
Pt has PN that is adding to her balance problems and falls Lab Results  Component Value Date   VITAMINB12 262 03/29/2015   Is taking 1000 mcg vit B12 daily  Will give 4 shots in 4 weeks Re check level in 2-3 mo  Hope this will help neuropathy and energy level

## 2015-07-16 NOTE — Progress Notes (Signed)
Subjective:    Patient ID: Julie [name], female    DOB: 12/15/1931, 81 y.o.   MRN: [id number]  HPI Here for ongoing balance problems and falls   Has been dx with PN -mild   Had orthotic made for foot drop (both feet)   Dx with R leg chorea  Had EMG/ MRI  B12 def Lab Results  Component Value Date   VITAMINB12 262 03/29/2015  oral suppl Has been taking oral tx 1000 mcg daily   ? If Huntingtons or paneoplastic syndrome Cannot afford the w/u-- Athe[employer] panel   Has had a few bad falls - one fx wrist and also hit her head once  She uses walker at all times Fall precautions at home- worked on  Did put up rails in the shower   Would love a walk in Tub   Also still sees Dr Tamala Julian for her knee L-wears a large knee stabilizer  Is considering surgery     Patient Active Problem List   Diagnosis Date Noted  . B12 deficiency 07/16/2015  . Peripheral neuropathy 07/16/2015  . 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 Rib contusion 03/28/2015  . Abnormal urine odor 03/26/2015  . Laceration of head 03/26/2015  . Observation after surgery 02/13/2015  . Phalanx fracture, foot 01/10/2015  . Colon cancer screening 09/22/2014  . Osteoarthritis of left lower extremity 09/09/2014  . History of falling 04/26/2014  . Poor balance 04/26/2014  . Left-sided weakness 04/26/2014  . PMR (polymyalgia rheumatica) 10/11/2013  . Encounter for Medicare annual wellness exam 07/13/2013  . Risk for falls 07/13/2013  . Skin tag of labia 03/14/2013  . Back pain, thoracic 08/07/2011  . Weakness of left leg 07/30/2011  . BACK PAIN, LUMBAR 12/10/2010  . BREAST MASS, RIGHT 09/13/2010  . Vitamin D deficiency 08/16/2010  . GERD 03/26/2010  . DYSPEPSIA 03/26/2010  . IRRITABLE BOWEL SYNDROME 03/26/2010  . Osteoporosis 08/27/2009  . Hyperlipidemia 07/24/2009  . DIVERTICULOSIS, COLON 07/24/2009  . OSTEOARTHRITIS 07/24/2009  . DIARRHEA, PERSISTENT 07/24/2009  . COLONIC POLYPS, HX OF 07/24/2009  . POSTMENOPAUSAL STATUS  07/24/2009   Past Medical History  Diagnosis Date  . GERD (gastroesophageal reflux disease)   . Esophagitis   . Duodenitis   . Chronic leg pain   . Trochanteric bursitis   . OA (osteoarthritis) of knee   . TMJ syndrome   . Seborrheic dermatitis   . HLD (hyperlipidemia)   . Colon polyp   . Diverticulosis of colon     internal----Dr. Vira Agar  . Hemorrhoids   . Adenoma   . IBS (irritable bowel syndrome)     with PP diarrhea  . Scoliosis   . Osteopenia   . DDD (degenerative disc disease), cervical    Past Surgical History  Procedure Laterality Date  . Total abdominal hysterectomy    . Hemorrhoid surgery    . Foot surgery  2010    bilateral hammer toes and "knot"  . Colonoscopy  1/11    polyps-hyperplastic and adenomatous  . Carpal tunnel release Left 01/18/2013    Procedure: CARPAL TUNNEL RELEASE;  Surgeon: Wynonia Sours, MD;  Location: [place];  Service: Orthopedics;  Laterality: Left;  OSTEOTOMY LEFT DISTAL RADIUS BONE CHIPS CARPAL TUNNEL RELEASE LEFT   . Wrist osteotomy Left 01/18/2013    Procedure: WRIST OSTEOTOMY;  Surgeon: Wynonia Sours, MD;  Location: [place];  Service: Orthopedics;  Laterality: Left;  . Cerebral aneurysm repair  2001  . Open reduction  internal fixation (orif) finger with radial bone graft Left 11/01/2013    Procedure: OPEN REDUCTION INTERNAL FIXATION (ORIF) LEFT SMALL FINGER;  Surgeon: Wynonia Sours, MD;  Location: [place];  Service: Orthopedics;  Laterality: Left;  . Open reduction internal fixation (orif) distal radial fracture Right 02/13/2015    Procedure: OPEN REDUCTION INTERNAL FIXATION (ORIF) RIGHT DISTAL RADIUS;  Surgeon: Daryll Brod, MD;  Location: [place];  Service: Orthopedics;  Laterality: Right;   Social History  Substance Use Topics  . Smoking status: Former Research scientist (life sciences)  . Smokeless tobacco: Never Used  . Alcohol Use: 0.0 oz/week    0 Standard drinks or equivalent per week      Comment: rare   Family History  Problem Relation Age of Onset  . Colon cancer Mother   . Osteoporosis      hip fracture  . Stroke Brother   . Colon cancer Brother   . Hypertension Brother   . Other Brother     Heart problem  . Colon cancer Brother   . Colon cancer Maternal Aunt    Allergies  Allergen Reactions  . Codeine     REACTION: headache and nausea and vomiting   Current Outpatient Prescriptions on File Prior to Visit  Medication Sig Dispense Refill  . diphenhydramine-acetaminophen (TYLENOL PM) 25-500 MG TABS Take 1 tablet by mouth at bedtime as needed.    . famotidine (PEPCID AC) 10 MG chewable tablet Chew 10 mg by mouth daily as needed.       No current facility-administered medications on file prior to visit.    Review of Systems Review of Systems  Constitutional: Negative for fever, appetite change, and unexpected weight change. pos for fatigue  Eyes: Negative for pain and visual disturbance.  Respiratory: Negative for cough and shortness of breath.   Cardiovascular: Negative for cp or palpitations    Gastrointestinal: Negative for nausea, diarrhea and constipation.  Genitourinary: Negative for urgency and frequency.  Skin: Negative for pallor or rash   MSK pos for knee pain L - with trouble weight bearing  Neurological: Negative for weakness, light-headedness, numbness and headaches. pos for frequent falls and uncontrollable movement of R leg  Hematological: Negative for adenopathy. Does not bruise/bleed easily.  Psychiatric/Behavioral: Negative for dysphoric mood. The patient is not nervous/anxious.         Objective:   Physical Exam  Constitutional: She is oriented to person, place, and time. She appears well-developed and well-nourished. No distress.  obese and well appearing   HENT:  Head: Normocephalic and atraumatic.  Eyes: Conjunctivae and EOM are normal. Pupils are equal, round, and reactive to light. No scleral icterus.  Neck: Normal range of  motion. Neck supple. Carotid bruit is not present.  Cardiovascular: Normal rate, regular rhythm and normal heart sounds.   Pulmonary/Chest: Effort normal and breath sounds normal. No respiratory distress. She has no wheezes. She has no rales.  Musculoskeletal: She exhibits no edema.  Poor rom L knee Wearing knee immobilizer   Lymphadenopathy:    She has no cervical adenopathy.  Neurological: She is alert and oriented to person, place, and time. She has normal reflexes. No cranial nerve deficit.  Chorea of R leg with almost constant movement   No tremor or other chorea   Skin: Skin is warm and dry. No rash noted. No erythema. No pallor.  Psychiatric: She has a normal mood and affect.          Assessment & Plan:  Problem List Items Addressed This Visit    B12 deficiency - Primary    Pt has PN that is adding to her balance problems and falls Lab Results  Component Value Date   VITAMINB12 262 03/29/2015   Is taking 1000 mcg vit B12 daily  Will give 4 shots in 4 weeks Re check level in 2-3 mo  Hope this will help neuropathy and energy level       Relevant Medications   cyanocobalamin ((VITAMIN B-12)) injection 1,000 mcg (Completed)   Chorea    Ongoing s/p neuro w/u - R leg only currently  Adding to fall risk  Pt declines further w/u including w/u for Huntington's Chorea   Disc safety Has help at home  Disc use of walker at all times  F/u neuro prn       History of falling    Multifactorial with R leg chorea and PN and generally bad balance Falls continue despite PT for vestibular rehab Also severe R knee pain - surgery may help Disc safety in detail  Continue neurology follow up as needed Get the bars put into your shower for safety Look into getting a life alert/ medic alert button  Make sure you have all the help you need  Use walker at all times  Do think about getting your knee fixed  B12 shot today           Peripheral neuropathy    Likely  multifactorial  Will tx B12 def  One factor contrib to falls

## 2015-07-16 NOTE — Assessment & Plan Note (Signed)
Likely multifactorial  Will tx B12 def  One factor contrib to falls

## 2015-07-16 NOTE — Progress Notes (Signed)
Pre visit review using our clinic review tool, if applicable. No additional management support is needed unless otherwise documented below in the visit note. 

## 2015-07-18 ENCOUNTER — Encounter: Payer: Self-pay | Admitting: Neurology

## 2015-07-24 ENCOUNTER — Ambulatory Visit (INDEPENDENT_AMBULATORY_CARE_PROVIDER_SITE_OTHER): Payer: Medicare Other

## 2015-07-24 DIAGNOSIS — M25562 Pain in left knee: Secondary | ICD-10-CM | POA: Diagnosis not present

## 2015-07-24 DIAGNOSIS — E538 Deficiency of other specified B group vitamins: Secondary | ICD-10-CM | POA: Diagnosis not present

## 2015-07-24 DIAGNOSIS — M1712 Unilateral primary osteoarthritis, left knee: Secondary | ICD-10-CM | POA: Diagnosis not present

## 2015-07-24 MED ORDER — CYANOCOBALAMIN 1000 MCG/ML IJ SOLN
1000.0000 ug | Freq: Once | INTRAMUSCULAR | Status: AC
  Administered 2015-07-24: 1000 ug via INTRAMUSCULAR

## 2015-07-30 DIAGNOSIS — H5211 Myopia, right eye: Secondary | ICD-10-CM | POA: Diagnosis not present

## 2015-07-31 ENCOUNTER — Ambulatory Visit (INDEPENDENT_AMBULATORY_CARE_PROVIDER_SITE_OTHER): Payer: Medicare Other | Admitting: *Deleted

## 2015-07-31 DIAGNOSIS — D519 Vitamin B12 deficiency anemia, unspecified: Secondary | ICD-10-CM

## 2015-07-31 MED ORDER — CYANOCOBALAMIN 1000 MCG/ML IJ SOLN
1000.0000 ug | Freq: Once | INTRAMUSCULAR | Status: AC
  Administered 2015-07-31: 1000 ug via INTRAMUSCULAR

## 2015-08-01 ENCOUNTER — Encounter: Payer: Self-pay | Admitting: *Deleted

## 2015-08-01 DIAGNOSIS — K2980 Duodenitis without bleeding: Secondary | ICD-10-CM | POA: Diagnosis not present

## 2015-08-01 DIAGNOSIS — K648 Other hemorrhoids: Secondary | ICD-10-CM | POA: Diagnosis not present

## 2015-08-01 DIAGNOSIS — M5030 Other cervical disc degeneration, unspecified cervical region: Secondary | ICD-10-CM | POA: Diagnosis not present

## 2015-08-01 DIAGNOSIS — K5730 Diverticulosis of large intestine without perforation or abscess without bleeding: Secondary | ICD-10-CM | POA: Diagnosis not present

## 2015-08-01 DIAGNOSIS — H2511 Age-related nuclear cataract, right eye: Secondary | ICD-10-CM | POA: Diagnosis not present

## 2015-08-01 DIAGNOSIS — G629 Polyneuropathy, unspecified: Secondary | ICD-10-CM | POA: Diagnosis not present

## 2015-08-01 DIAGNOSIS — M1710 Unilateral primary osteoarthritis, unspecified knee: Secondary | ICD-10-CM | POA: Diagnosis not present

## 2015-08-01 DIAGNOSIS — L219 Seborrheic dermatitis, unspecified: Secondary | ICD-10-CM | POA: Diagnosis not present

## 2015-08-01 DIAGNOSIS — K209 Esophagitis, unspecified: Secondary | ICD-10-CM | POA: Diagnosis not present

## 2015-08-01 DIAGNOSIS — R0600 Dyspnea, unspecified: Secondary | ICD-10-CM | POA: Diagnosis not present

## 2015-08-01 DIAGNOSIS — Z885 Allergy status to narcotic agent status: Secondary | ICD-10-CM | POA: Diagnosis not present

## 2015-08-01 DIAGNOSIS — Z86010 Personal history of colonic polyps: Secondary | ICD-10-CM | POA: Diagnosis not present

## 2015-08-01 DIAGNOSIS — E785 Hyperlipidemia, unspecified: Secondary | ICD-10-CM | POA: Diagnosis not present

## 2015-08-01 DIAGNOSIS — K589 Irritable bowel syndrome without diarrhea: Secondary | ICD-10-CM | POA: Diagnosis not present

## 2015-08-01 DIAGNOSIS — Z87891 Personal history of nicotine dependence: Secondary | ICD-10-CM | POA: Diagnosis not present

## 2015-08-01 DIAGNOSIS — M2669 Other specified disorders of temporomandibular joint: Secondary | ICD-10-CM | POA: Diagnosis not present

## 2015-08-01 DIAGNOSIS — R2681 Unsteadiness on feet: Secondary | ICD-10-CM | POA: Diagnosis not present

## 2015-08-01 DIAGNOSIS — K219 Gastro-esophageal reflux disease without esophagitis: Secondary | ICD-10-CM | POA: Diagnosis not present

## 2015-08-07 ENCOUNTER — Encounter: Payer: Self-pay | Admitting: *Deleted

## 2015-08-07 ENCOUNTER — Encounter
Admission: RE | Disposition: A | Discharge status: Home / Self Care | Payer: Self-pay | Source: Ambulatory Visit | Attending: Ophthalmology

## 2015-08-07 ENCOUNTER — Ambulatory Visit: Payer: Medicare Other | Admitting: Registered Nurse

## 2015-08-07 ENCOUNTER — Ambulatory Visit
Admission: RE | Admit: 2015-08-07 | Discharge: 2015-08-07 | Disposition: A | Discharge status: Home / Self Care | Payer: Medicare Other | Source: Ambulatory Visit | Attending: Ophthalmology | Admitting: Ophthalmology

## 2015-08-07 DIAGNOSIS — G629 Polyneuropathy, unspecified: Secondary | ICD-10-CM | POA: Diagnosis not present

## 2015-08-07 DIAGNOSIS — R0600 Dyspnea, unspecified: Secondary | ICD-10-CM | POA: Diagnosis not present

## 2015-08-07 DIAGNOSIS — E785 Hyperlipidemia, unspecified: Secondary | ICD-10-CM | POA: Diagnosis not present

## 2015-08-07 DIAGNOSIS — Z87891 Personal history of nicotine dependence: Secondary | ICD-10-CM | POA: Insufficient documentation

## 2015-08-07 DIAGNOSIS — K589 Irritable bowel syndrome without diarrhea: Secondary | ICD-10-CM | POA: Insufficient documentation

## 2015-08-07 DIAGNOSIS — K648 Other hemorrhoids: Secondary | ICD-10-CM | POA: Insufficient documentation

## 2015-08-07 DIAGNOSIS — M2669 Other specified disorders of temporomandibular joint: Secondary | ICD-10-CM | POA: Diagnosis not present

## 2015-08-07 DIAGNOSIS — L219 Seborrheic dermatitis, unspecified: Secondary | ICD-10-CM | POA: Insufficient documentation

## 2015-08-07 DIAGNOSIS — M5030 Other cervical disc degeneration, unspecified cervical region: Secondary | ICD-10-CM | POA: Insufficient documentation

## 2015-08-07 DIAGNOSIS — R2681 Unsteadiness on feet: Secondary | ICD-10-CM | POA: Insufficient documentation

## 2015-08-07 DIAGNOSIS — K2980 Duodenitis without bleeding: Secondary | ICD-10-CM | POA: Insufficient documentation

## 2015-08-07 DIAGNOSIS — K209 Esophagitis, unspecified: Secondary | ICD-10-CM | POA: Insufficient documentation

## 2015-08-07 DIAGNOSIS — H2511 Age-related nuclear cataract, right eye: Secondary | ICD-10-CM | POA: Diagnosis not present

## 2015-08-07 DIAGNOSIS — Z885 Allergy status to narcotic agent status: Secondary | ICD-10-CM | POA: Insufficient documentation

## 2015-08-07 DIAGNOSIS — K219 Gastro-esophageal reflux disease without esophagitis: Secondary | ICD-10-CM | POA: Diagnosis not present

## 2015-08-07 DIAGNOSIS — M1710 Unilateral primary osteoarthritis, unspecified knee: Secondary | ICD-10-CM | POA: Insufficient documentation

## 2015-08-07 DIAGNOSIS — K5730 Diverticulosis of large intestine without perforation or abscess without bleeding: Secondary | ICD-10-CM | POA: Diagnosis not present

## 2015-08-07 DIAGNOSIS — Z86010 Personal history of colonic polyps: Secondary | ICD-10-CM | POA: Diagnosis not present

## 2015-08-07 HISTORY — DX: Unsteadiness on feet: R26.81

## 2015-08-07 HISTORY — DX: Polyneuropathy, unspecified: G62.9

## 2015-08-07 HISTORY — DX: Reserved for inherently not codable concepts without codable children: IMO0001

## 2015-08-07 HISTORY — PX: CATARACT EXTRACTION W/PHACO: SHX586

## 2015-08-07 SURGERY — PHACOEMULSIFICATION, CATARACT, WITH IOL INSERTION
Anesthesia: Monitor Anesthesia Care | Laterality: Right

## 2015-08-07 MED ORDER — NA CHONDROIT SULF-NA HYALURON 40-17 MG/ML IO SOLN
INTRAOCULAR | Status: AC
  Filled 2015-08-07: qty 1

## 2015-08-07 MED ORDER — POVIDONE-IODINE 5 % OP SOLN
1.0000 "application " | OPHTHALMIC | Status: AC | PRN
  Administered 2015-08-07: 1 via OPHTHALMIC

## 2015-08-07 MED ORDER — ONDANSETRON HCL 4 MG/2ML IJ SOLN
INTRAMUSCULAR | Status: DC | PRN
  Administered 2015-08-07: 4 mg via INTRAVENOUS

## 2015-08-07 MED ORDER — SODIUM CHLORIDE 0.9 % IV SOLN
INTRAVENOUS | Status: DC
  Administered 2015-08-07: 10:00:00 via INTRAVENOUS

## 2015-08-07 MED ORDER — TETRACAINE HCL 0.5 % OP SOLN
1.0000 [drp] | OPHTHALMIC | Status: AC | PRN
  Administered 2015-08-07: 1 [drp] via OPHTHALMIC

## 2015-08-07 MED ORDER — POVIDONE-IODINE 5 % OP SOLN
OPHTHALMIC | Status: AC
  Administered 2015-08-07: 1 via OPHTHALMIC
  Filled 2015-08-07: qty 30

## 2015-08-07 MED ORDER — NA CHONDROIT SULF-NA HYALURON 40-17 MG/ML IO SOLN
INTRAOCULAR | Status: DC | PRN
  Administered 2015-08-07: 1 mL via INTRAOCULAR

## 2015-08-07 MED ORDER — MOXIFLOXACIN HCL 0.5 % OP SOLN
OPHTHALMIC | Status: DC | PRN
  Administered 2015-08-07: 2 [drp] via OPHTHALMIC

## 2015-08-07 MED ORDER — ARMC OPHTHALMIC DILATING GEL
OPHTHALMIC | Status: AC
  Administered 2015-08-07: 1 via OPHTHALMIC
  Filled 2015-08-07: qty 0.25

## 2015-08-07 MED ORDER — EPINEPHRINE HCL 1 MG/ML IJ SOLN
INTRAOCULAR | Status: DC | PRN
  Administered 2015-08-07: 250 mL via OPHTHALMIC

## 2015-08-07 MED ORDER — EPINEPHRINE HCL 1 MG/ML IJ SOLN
INTRAMUSCULAR | Status: AC
  Filled 2015-08-07: qty 1

## 2015-08-07 MED ORDER — CEFUROXIME OPHTHALMIC INJECTION 1 MG/0.1 ML
INJECTION | OPHTHALMIC | Status: AC
  Filled 2015-08-07: qty 0.1

## 2015-08-07 MED ORDER — MIDAZOLAM HCL 2 MG/2ML IJ SOLN
INTRAMUSCULAR | Status: DC | PRN
  Administered 2015-08-07: 1 mg via INTRAVENOUS

## 2015-08-07 MED ORDER — ARMC OPHTHALMIC DILATING GEL
1.0000 "application " | OPHTHALMIC | Status: DC | PRN
  Administered 2015-08-07: 1 via OPHTHALMIC

## 2015-08-07 MED ORDER — TETRACAINE HCL 0.5 % OP SOLN
OPHTHALMIC | Status: AC
  Administered 2015-08-07: 1 [drp] via OPHTHALMIC
  Filled 2015-08-07: qty 2

## 2015-08-07 MED ORDER — FENTANYL CITRATE (PF) 100 MCG/2ML IJ SOLN
INTRAMUSCULAR | Status: DC | PRN
  Administered 2015-08-07: 50 ug via INTRAVENOUS

## 2015-08-07 MED ORDER — MOXIFLOXACIN HCL 0.5 % OP SOLN
OPHTHALMIC | Status: AC
  Filled 2015-08-07: qty 3

## 2015-08-07 SURGICAL SUPPLY — 23 items
CANNULA ANT/CHMB 27G (MISCELLANEOUS) ×1 IMPLANT
CANNULA ANT/CHMB 27GA (MISCELLANEOUS) ×2 IMPLANT
CUP MEDICINE 2OZ PLAST GRAD ST (MISCELLANEOUS) ×1 IMPLANT
GLOVE BIO SURGEON STRL SZ8 (GLOVE) ×2 IMPLANT
GLOVE BIOGEL M 6.5 STRL (GLOVE) ×2 IMPLANT
GLOVE SURG LX 8.0 MICRO (GLOVE) ×1
GLOVE SURG LX STRL 8.0 MICRO (GLOVE) ×1 IMPLANT
GOWN STRL REUS W/ TWL LRG LVL3 (GOWN DISPOSABLE) ×2 IMPLANT
GOWN STRL REUS W/TWL LRG LVL3 (GOWN DISPOSABLE) ×4
LENS IOL TECNIS 22.5 (Intraocular Lens) ×2 IMPLANT
LENS IOL TECNIS MONO 1P 22.5 (Intraocular Lens) IMPLANT
PACK CATARACT (MISCELLANEOUS) ×2 IMPLANT
PACK CATARACT BRASINGTON LX (MISCELLANEOUS) ×2 IMPLANT
PACK EYE AFTER SURG (MISCELLANEOUS) ×2 IMPLANT
SOL BSS BAG (MISCELLANEOUS) ×2
SOL PREP PVP 2OZ (MISCELLANEOUS) ×4
SOLUTION BSS BAG (MISCELLANEOUS) ×1 IMPLANT
SOLUTION PREP PVP 2OZ (MISCELLANEOUS) ×1 IMPLANT
SYR 3ML LL SCALE MARK (SYRINGE) ×1 IMPLANT
SYR 5ML LL (SYRINGE) ×2 IMPLANT
SYR TB 1ML 27GX1/2 LL (SYRINGE) ×2 IMPLANT
WATER STERILE IRR 1000ML POUR (IV SOLUTION) ×2 IMPLANT
WIPE NON LINTING 3.25X3.25 (MISCELLANEOUS) ×2 IMPLANT

## 2015-08-07 NOTE — Anesthesia Procedure Notes (Signed)
Procedure Name: MAC Date/Time: 08/07/2015 10:54 AM Performed by: Doreen Salvage Pre-anesthesia Checklist: Patient identified, Emergency Drugs available, Suction available and Patient being monitored Patient Re-evaluated:Patient Re-evaluated prior to inductionOxygen Delivery Method: Nasal cannula

## 2015-08-07 NOTE — Transfer of Care (Signed)
Immediate Anesthesia Transfer of Care Note  Patient: Julie [name]  Procedure(s) Performed: Procedure(s) with comments: CATARACT EXTRACTION PHACO AND INTRAOCULAR LENS PLACEMENT (IOC) (Right) - [place] 00:48.0 AP   22.3 CDE 10.69 casette lot # [id number]  Patient Location: Short Stay  Anesthesia Type:MAC  Level of Consciousness: awake, alert , oriented and patient cooperative  Airway & Oxygen Therapy: Patient Spontanous Breathing  Post-op Assessment: Report given to RN, Post -op Vital signs reviewed and stable and Patient moving all extremities X 4  Post vital signs: Reviewed and stable  Last Vitals:  Filed Vitals:   08/07/15 1119  BP: 154/61  Pulse: 66  Temp: 36.4 C  Resp: 16    Complications: No apparent anesthesia complications

## 2015-08-07 NOTE — Anesthesia Preprocedure Evaluation (Addendum)
Anesthesia Evaluation  Patient identified by MRN, date of birth, ID band Patient awake    Reviewed: Allergy & Precautions, H&P , NPO status , Patient's Chart, lab work & pertinent test results, reviewed documented beta blocker date and time   History of Anesthesia Complications Negative for: history of anesthetic complications  Airway Mallampati: III  TM Distance: >3 FB Neck ROM: full    Dental no notable dental hx. (+) Teeth Intact   Pulmonary neg shortness of breath, neg sleep apnea, neg COPDneg recent URI, former smoker,  breath sounds clear to auscultation  Pulmonary exam normal       Cardiovascular Exercise Tolerance: Good negative cardio ROS Normal cardiovascular examRhythm:regular Rate:Normal     Neuro/Psych  Neuromuscular disease negative psych ROS   GI/Hepatic Neg liver ROS, GERD-  Medicated and Controlled,  Endo/Other  negative endocrine ROS  Renal/GU negative Renal ROS  negative genitourinary   Musculoskeletal   Abdominal   Peds  Hematology negative hematology ROS (+)   Anesthesia Other Findings Past Medical History:   GERD (gastroesophageal reflux disease)                       Esophagitis                                                  Duodenitis                                                   Chronic leg pain                                             Trochanteric bursitis                                        OA (osteoarthritis) of knee                                  TMJ syndrome                                                 Seborrheic dermatitis                                        HLD (hyperlipidemia)                                         Colon polyp  Diverticulosis of colon                                        Comment:internal----Dr. Vira Agar   Hemorrhoids                                                  Adenoma                                                       IBS (irritable bowel syndrome)                                 Comment:with PP diarrhea   Scoliosis                                                    Osteopenia                                                   DDD (degenerative disc disease), cervical                    Shortness of breath dyspnea                                  Neuropathy                                                   Unsteady gait                                                  Comment:FALLS EASILY   Reproductive/Obstetrics negative OB ROS                            Anesthesia Physical Anesthesia Plan  ASA: II  Anesthesia Plan: MAC   Post-op Pain Management:    Induction:   Airway Management Planned:   Additional Equipment:   Intra-op Plan:   Post-operative Plan:   Informed Consent: I have reviewed the patients History and Physical, chart, labs and discussed the procedure including the risks, benefits and alternatives for the proposed anesthesia with the patient or authorized representative who has indicated his/her understanding and acceptance.   Dental Advisory Given  Plan Discussed with: Anesthesiologist, CRNA and Surgeon  Anesthesia Plan Comments:         Anesthesia Quick Evaluation

## 2015-08-07 NOTE — Anesthesia Postprocedure Evaluation (Signed)
  Anesthesia Post-op Note  Patient: Julie [name]  Procedure(s) Performed: Procedure(s) with comments: CATARACT EXTRACTION PHACO AND INTRAOCULAR LENS PLACEMENT (IOC) (Right) - [place] 00:48.0 AP   22.3 CDE 10.69 casette lot # [id number]  Anesthesia type:MAC  Patient location: short stay  Post pain: Pain level controlled  Post assessment: Post-op Vital signs reviewed, Patient's Cardiovascular Status Stable, Respiratory Function Stable, Patent Airway and No signs of Nausea or vomiting  Post vital signs: Reviewed and stable  Last Vitals:  Filed Vitals:   08/07/15 1119  BP: 154/61  Pulse: 66  Temp: 36.4 C  Resp: 16    Level of consciousness: awake, alert  and patient cooperative  Complications: No apparent anesthesia complications

## 2015-08-07 NOTE — H&P (Signed)
  All labs reviewed. Abnormal studies sent to patients PCP when indicated.  Previous H&P reviewed, patient examined, there are NO CHANGES.  [name]9/6/201610:52 AM

## 2015-08-07 NOTE — Discharge Instructions (Signed)
Eye Surgery Discharge Instructions  Expect mild scratchy sensation or mild soreness. DO NOT RUB YOUR EYE!  The day of surgery:  Minimal physical activity, but bed rest is not required  No reading, computer work, or close hand work  No bending, lifting, or straining.  May watch TV  For 24 hours:  No driving, legal decisions, or alcoholic beverages  Safety precautions  Eat anything you prefer: It is better to start with liquids, then soup then solid foods.  _____ Eye patch should be worn until postoperative exam tomorrow.  ____ Solar shield eyeglasses should be worn for comfort in the sunlight/patch while sleeping  Resume all regular medications including aspirin or Coumadin if these were discontinued prior to surgery. You may shower, bathe, shave, or wash your hair. Tylenol may be taken for mild discomfort.  Call your doctor if you experience significant pain, nausea, or vomiting, fever > 101 or other signs of infection. [phone] or [phone] Specific instructions:  Follow-up Information    Follow up with Tim Lair, MD. Go on 08/08/2015.   Specialty:  Ophthalmology   Why:  at 9:40   Contact information:   [place] [address] [phone]

## 2015-08-07 NOTE — Op Note (Signed)
PREOPERATIVE DIAGNOSIS:  Nuclear sclerotic cataract of the right eye.   POSTOPERATIVE DIAGNOSIS: nuclear sclerotic cataract right eye   OPERATIVE PROCEDURE:  Procedure(s): CATARACT EXTRACTION PHACO AND INTRAOCULAR LENS PLACEMENT (IOC)   SURGEON:  Birder Robson, MD.   ANESTHESIA:  Anesthesiologist: Martha Clan, MD CRNA: Silvana Newness, CRNA; Doreen Salvage, CRNA  1.      Managed anesthesia care. 2.      Topical tetracaine drops followed by 2% Xylocaine jelly applied in the preoperative holding area.   COMPLICATIONS:  None.   TECHNIQUE:   Stop and chop   DESCRIPTION OF PROCEDURE:  The patient was examined and consented in the preoperative holding area where the aforementioned topical anesthesia was applied to the right eye and then brought back to the Operating Room where the right eye was prepped and draped in the usual sterile ophthalmic fashion and a lid speculum was placed. A paracentesis was created with the side port blade and the anterior chamber was filled with viscoelastic. A near clear corneal incision was performed with the steel keratome. A continuous curvilinear capsulorrhexis was performed with a cystotome followed by the capsulorrhexis forceps. Hydrodissection and hydrodelineation were carried out with BSS on a blunt cannula. The lens was removed in a stop and chop  technique and the remaining cortical material was removed with the irrigation-aspiration handpiece. The capsular bag was inflated with viscoelastic and the Technis ZCB00  lens was placed in the capsular bag without complication. The remaining viscoelastic was removed from the eye with the irrigation-aspiration handpiece. The wounds were hydrated. The anterior chamber was flushed with Miostat and the eye was inflated to physiologic pressure. 0.1 mL of cefuroxime concentration 10 mg/mL was placed in the anterior chamber. The wounds were found to be water tight. The eye was dressed with Vigamox. The patient was given  protective glasses to wear throughout the day and a shield with which to sleep tonight. The patient was also given drops with which to begin a drop regimen today and will follow-up with me in one day.  Implant Name Type Inv. Item Serial No. Manufacturer Lot No. LRB No. Used  LENS IMPL INTRAOC ZCB00 22.5 - [id number] Intraocular Lens LENS IMPL INTRAOC ZCB00 22.5 [id number] AMO   Right 1   Procedure(s) with comments: CATARACT EXTRACTION PHACO AND INTRAOCULAR LENS PLACEMENT (IOC) (Right) - [place] 00:48.0 AP   22.3 CDE 10.69 casette lot # [id number]  Electronically signed: [address] 08/07/2015 11:15 AM

## 2015-08-09 DIAGNOSIS — M1712 Unilateral primary osteoarthritis, left knee: Secondary | ICD-10-CM | POA: Diagnosis not present

## 2015-08-14 ENCOUNTER — Ambulatory Visit (INDEPENDENT_AMBULATORY_CARE_PROVIDER_SITE_OTHER): Payer: Medicare Other | Admitting: *Deleted

## 2015-08-14 DIAGNOSIS — E538 Deficiency of other specified B group vitamins: Secondary | ICD-10-CM | POA: Diagnosis not present

## 2015-08-14 MED ORDER — CYANOCOBALAMIN 1000 MCG/ML IJ SOLN
1000.0000 ug | Freq: Once | INTRAMUSCULAR | Status: AC
  Administered 2015-08-14: 1000 ug via INTRAMUSCULAR

## 2015-08-22 DIAGNOSIS — M1811 Unilateral primary osteoarthritis of first carpometacarpal joint, right hand: Secondary | ICD-10-CM | POA: Diagnosis not present

## 2015-08-22 DIAGNOSIS — M1812 Unilateral primary osteoarthritis of first carpometacarpal joint, left hand: Secondary | ICD-10-CM | POA: Diagnosis not present

## 2015-08-27 DIAGNOSIS — H2512 Age-related nuclear cataract, left eye: Secondary | ICD-10-CM | POA: Diagnosis not present

## 2015-09-03 ENCOUNTER — Encounter: Payer: Self-pay | Admitting: *Deleted

## 2015-09-04 ENCOUNTER — Encounter: Payer: Self-pay | Admitting: Ophthalmology

## 2015-09-04 ENCOUNTER — Ambulatory Visit: Payer: Medicare Other | Admitting: Certified Registered Nurse Anesthetist

## 2015-09-04 ENCOUNTER — Ambulatory Visit
Admission: RE | Admit: 2015-09-04 | Discharge: 2015-09-04 | Disposition: A | Discharge status: Home / Self Care | Payer: Medicare Other | Source: Ambulatory Visit | Attending: Ophthalmology | Admitting: Ophthalmology

## 2015-09-04 ENCOUNTER — Encounter
Admission: RE | Disposition: A | Discharge status: Home / Self Care | Payer: Self-pay | Source: Ambulatory Visit | Attending: Ophthalmology

## 2015-09-04 DIAGNOSIS — G629 Polyneuropathy, unspecified: Secondary | ICD-10-CM | POA: Insufficient documentation

## 2015-09-04 DIAGNOSIS — Z8669 Personal history of other diseases of the nervous system and sense organs: Secondary | ICD-10-CM | POA: Insufficient documentation

## 2015-09-04 DIAGNOSIS — M1990 Unspecified osteoarthritis, unspecified site: Secondary | ICD-10-CM | POA: Insufficient documentation

## 2015-09-04 DIAGNOSIS — Z87891 Personal history of nicotine dependence: Secondary | ICD-10-CM | POA: Insufficient documentation

## 2015-09-04 DIAGNOSIS — Z90710 Acquired absence of both cervix and uterus: Secondary | ICD-10-CM | POA: Insufficient documentation

## 2015-09-04 DIAGNOSIS — R0602 Shortness of breath: Secondary | ICD-10-CM | POA: Insufficient documentation

## 2015-09-04 DIAGNOSIS — H2512 Age-related nuclear cataract, left eye: Secondary | ICD-10-CM | POA: Diagnosis not present

## 2015-09-04 DIAGNOSIS — M419 Scoliosis, unspecified: Secondary | ICD-10-CM | POA: Diagnosis not present

## 2015-09-04 DIAGNOSIS — Z79899 Other long term (current) drug therapy: Secondary | ICD-10-CM | POA: Insufficient documentation

## 2015-09-04 DIAGNOSIS — K219 Gastro-esophageal reflux disease without esophagitis: Secondary | ICD-10-CM | POA: Diagnosis not present

## 2015-09-04 DIAGNOSIS — Z885 Allergy status to narcotic agent status: Secondary | ICD-10-CM | POA: Diagnosis not present

## 2015-09-04 DIAGNOSIS — Z9841 Cataract extraction status, right eye: Secondary | ICD-10-CM | POA: Diagnosis not present

## 2015-09-04 DIAGNOSIS — H269 Unspecified cataract: Secondary | ICD-10-CM | POA: Diagnosis not present

## 2015-09-04 HISTORY — PX: CATARACT EXTRACTION W/PHACO: SHX586

## 2015-09-04 SURGERY — PHACOEMULSIFICATION, CATARACT, WITH IOL INSERTION
Anesthesia: Choice | Site: Eye | Laterality: Left | Wound class: Clean

## 2015-09-04 MED ORDER — FENTANYL CITRATE (PF) 100 MCG/2ML IJ SOLN
INTRAMUSCULAR | Status: DC | PRN
  Administered 2015-09-04: 25 ug via INTRAVENOUS
  Administered 2015-09-04: 50 ug via INTRAVENOUS
  Administered 2015-09-04: 25 ug via INTRAVENOUS

## 2015-09-04 MED ORDER — ONDANSETRON HCL 4 MG/2ML IJ SOLN
INTRAMUSCULAR | Status: DC | PRN
  Administered 2015-09-04: 4 mg via INTRAVENOUS

## 2015-09-04 MED ORDER — SODIUM CHLORIDE 0.9 % IV SOLN
INTRAVENOUS | Status: DC
  Administered 2015-09-04: 10:00:00 via INTRAVENOUS

## 2015-09-04 MED ORDER — ONDANSETRON HCL 4 MG/2ML IJ SOLN
INTRAMUSCULAR | Status: AC
  Filled 2015-09-04: qty 2

## 2015-09-04 MED ORDER — POVIDONE-IODINE 5 % OP SOLN
1.0000 "application " | OPHTHALMIC | Status: AC | PRN
  Administered 2015-09-04: 1 via OPHTHALMIC

## 2015-09-04 MED ORDER — NA CHONDROIT SULF-NA HYALURON 40-17 MG/ML IO SOLN
INTRAOCULAR | Status: DC | PRN
  Administered 2015-09-04: 1 mL via INTRAOCULAR

## 2015-09-04 MED ORDER — POVIDONE-IODINE 5 % OP SOLN
OPHTHALMIC | Status: AC
  Administered 2015-09-04: 1 via OPHTHALMIC
  Filled 2015-09-04: qty 30

## 2015-09-04 MED ORDER — ARMC OPHTHALMIC DILATING GEL
OPHTHALMIC | Status: AC
  Administered 2015-09-04: 1 via OPHTHALMIC
  Filled 2015-09-04: qty 0.25

## 2015-09-04 MED ORDER — CEFUROXIME OPHTHALMIC INJECTION 1 MG/0.1 ML
INJECTION | OPHTHALMIC | Status: AC
  Filled 2015-09-04: qty 0.1

## 2015-09-04 MED ORDER — MOXIFLOXACIN HCL 0.5 % OP SOLN: 1.0000 [drp] | OPHTHALMIC | Status: DC | PRN

## 2015-09-04 MED ORDER — CEFUROXIME OPHTHALMIC INJECTION 1 MG/0.1 ML
INJECTION | OPHTHALMIC | Status: DC | PRN
  Administered 2015-09-04: 1 mg via INTRACAMERAL

## 2015-09-04 MED ORDER — MOXIFLOXACIN HCL 0.5 % OP SOLN
OPHTHALMIC | Status: AC
  Filled 2015-09-04: qty 3

## 2015-09-04 MED ORDER — EPINEPHRINE HCL 1 MG/ML IJ SOLN
INTRAMUSCULAR | Status: AC
  Filled 2015-09-04: qty 1

## 2015-09-04 MED ORDER — MOXIFLOXACIN HCL 0.5 % OP SOLN
OPHTHALMIC | Status: DC | PRN
  Administered 2015-09-04: 1 [drp]

## 2015-09-04 MED ORDER — ARMC OPHTHALMIC DILATING GEL
1.0000 "application " | OPHTHALMIC | Status: DC | PRN
  Administered 2015-09-04: 1 via OPHTHALMIC

## 2015-09-04 MED ORDER — TETRACAINE HCL 0.5 % OP SOLN
OPHTHALMIC | Status: AC
  Administered 2015-09-04: 1 [drp] via OPHTHALMIC
  Filled 2015-09-04: qty 2

## 2015-09-04 MED ORDER — BSS IO SOLN
INTRAOCULAR | Status: DC | PRN
  Administered 2015-09-04: 200 mL via OPHTHALMIC

## 2015-09-04 MED ORDER — TETRACAINE HCL 0.5 % OP SOLN
1.0000 [drp] | OPHTHALMIC | Status: AC | PRN
  Administered 2015-09-04: 1 [drp] via OPHTHALMIC

## 2015-09-04 MED ORDER — NA CHONDROIT SULF-NA HYALURON 40-17 MG/ML IO SOLN
INTRAOCULAR | Status: AC
  Filled 2015-09-04: qty 1

## 2015-09-04 SURGICAL SUPPLY — 23 items
CANNULA ANT/CHMB 27G (MISCELLANEOUS) ×1 IMPLANT
CANNULA ANT/CHMB 27GA (MISCELLANEOUS) ×2 IMPLANT
CUP MEDICINE 2OZ PLAST GRAD ST (MISCELLANEOUS) ×2 IMPLANT
GLOVE BIO SURGEON STRL SZ8 (GLOVE) ×2 IMPLANT
GLOVE BIOGEL M 6.5 STRL (GLOVE) ×2 IMPLANT
GLOVE SURG LX 8.0 MICRO (GLOVE) ×1
GLOVE SURG LX STRL 8.0 MICRO (GLOVE) ×1 IMPLANT
GOWN STRL REUS W/ TWL LRG LVL3 (GOWN DISPOSABLE) ×2 IMPLANT
GOWN STRL REUS W/TWL LRG LVL3 (GOWN DISPOSABLE) ×4
LENS IOL TECNIS 23.0 (Intraocular Lens) ×2 IMPLANT
LENS IOL TECNIS MONO 1P 23.0 (Intraocular Lens) IMPLANT
PACK CATARACT (MISCELLANEOUS) ×2 IMPLANT
PACK CATARACT BRASINGTON LX (MISCELLANEOUS) ×2 IMPLANT
PACK EYE AFTER SURG (MISCELLANEOUS) ×2 IMPLANT
SOL BSS BAG (MISCELLANEOUS) ×2
SOL PREP PVP 2OZ (MISCELLANEOUS) ×2
SOLUTION BSS BAG (MISCELLANEOUS) ×1 IMPLANT
SOLUTION PREP PVP 2OZ (MISCELLANEOUS) ×1 IMPLANT
SYR 3ML LL SCALE MARK (SYRINGE) ×2 IMPLANT
SYR 5ML LL (SYRINGE) ×2 IMPLANT
SYR TB 1ML 27GX1/2 LL (SYRINGE) ×2 IMPLANT
WATER STERILE IRR 1000ML POUR (IV SOLUTION) ×2 IMPLANT
WIPE NON LINTING 3.25X3.25 (MISCELLANEOUS) ×2 IMPLANT

## 2015-09-04 NOTE — H&P (Signed)
  All labs reviewed. Abnormal studies sent to patients PCP when indicated.  Previous H&P reviewed, patient examined, there are NO CHANGES.  Julie [name] LOUIS10/4/201611:11 AM

## 2015-09-04 NOTE — Op Note (Signed)
PREOPERATIVE DIAGNOSIS:  Nuclear sclerotic cataract of the left eye.   POSTOPERATIVE DIAGNOSIS:  cataract LEFT EYE   OPERATIVE PROCEDURE:  Procedure(s): CATARACT EXTRACTION PHACO AND INTRAOCULAR LENS PLACEMENT (IOC)   SURGEON:  Birder Robson, MD.   ANESTHESIA:   Anesthesiologist: Gunnar Fusi, MD CRNA: Demetrius Charity, CRNA  1.      Managed anesthesia care. 2.      Topical tetracaine drops followed by 2% Xylocaine jelly applied in the preoperative holding area.   COMPLICATIONS:  None.   TECHNIQUE:   Stop and chop   DESCRIPTION OF PROCEDURE:  The patient was examined and consented in the preoperative holding area where the aforementioned topical anesthesia was applied to the left eye and then brought back to the Operating Room where the left eye was prepped and draped in the usual sterile ophthalmic fashion and a lid speculum was placed. A paracentesis was created with the side port blade and the anterior chamber was filled with viscoelastic. A near clear corneal incision was performed with the steel keratome. A continuous curvilinear capsulorrhexis was performed with a cystotome followed by the capsulorrhexis forceps. Hydrodissection and hydrodelineation were carried out with BSS on a blunt cannula. The lens was removed in a stop and chop  technique and the remaining cortical material was removed with the irrigation-aspiration handpiece. The capsular bag was inflated with viscoelastic and the Technis ZCB00 lens was placed in the capsular bag without complication. The remaining viscoelastic was removed from the eye with the irrigation-aspiration handpiece. The wounds were hydrated. The anterior chamber was flushed with Miostat and the eye was inflated to physiologic pressure. 0.1 mL of cefuroxime concentration 10 mg/mL was placed in the anterior chamber. The wounds were found to be water tight. The eye was dressed with Vigamox. The patient was given protective glasses to wear throughout  the day and a shield with which to sleep tonight. The patient was also given drops with which to begin a drop regimen today and will follow-up with me in one day.  Implant Name Type Inv. Item Serial No. Manufacturer Lot No. LRB No. Used  LENS IMPL INTRAOC ZCB00 23.0 - [id number] Intraocular Lens LENS IMPL INTRAOC ZCB00 23.0 [id number] AMO   Left 1   Procedure(s) with comments: CATARACT EXTRACTION PHACO AND INTRAOCULAR LENS PLACEMENT (IOC) (Left) - [place] AP CDE FLUID LOT # [id number]  Electronically signed: Laportia Carley LOUIS 09/04/2015 11:39 AM

## 2015-09-04 NOTE — Anesthesia Procedure Notes (Signed)
Procedure Name: MAC Performed by: Jezabelle Chisolm Pre-anesthesia Checklist: Patient identified, Emergency Drugs available, Suction available, Patient being monitored and Timeout performed Oxygen Delivery Method: Nasal cannula       

## 2015-09-04 NOTE — Anesthesia Postprocedure Evaluation (Signed)
  Anesthesia Post-op Note  Patient: Julie [name]  Procedure(s) Performed: Procedure(s) with comments: CATARACT EXTRACTION PHACO AND INTRAOCULAR LENS PLACEMENT (IOC) (Left) - [place] AP CDE FLUID LOT # [id number]  Anesthesia type:No value filed.  Patient location: PACU  Post pain: Pain level controlled  Post assessment: Post-op Vital signs reviewed, Patient's Cardiovascular Status Stable, Respiratory Function Stable, Patent Airway and No signs of Nausea or vomiting  Post vital signs: Reviewed and stable  Last Vitals:  Filed Vitals:   09/04/15 1005  BP: 143/104  Pulse: 65  Temp: 36.9 C  Resp: 16    Level of consciousness: awake, alert  and patient cooperative  Complications: No apparent anesthesia complications

## 2015-09-04 NOTE — Transfer of Care (Signed)
Immediate Anesthesia Transfer of Care Note  Patient: Julie [name]  Procedure(s) Performed: Procedure(s) with comments: CATARACT EXTRACTION PHACO AND INTRAOCULAR LENS PLACEMENT (IOC) (Left) - [place] AP CDE FLUID LOT # [id number]  Patient Location: PACU  Anesthesia Type:MAC  Level of Consciousness: awake, alert  and oriented  Airway & Oxygen Therapy: Patient Spontanous Breathing  Post-op Assessment: Report given to RN and Post -op Vital signs reviewed and stable  Post vital signs: Reviewed and stable  Last Vitals:  Filed Vitals:   09/04/15 1005  BP: 143/104  Pulse: 65  Temp: 36.9 C  Resp: 16    Complications: No apparent anesthesia complications

## 2015-09-04 NOTE — OR Nursing (Signed)
Nausea improved. Ready for dc home.

## 2015-09-04 NOTE — OR Nursing (Signed)
Complaining of nausea. Zofran 4mg  ivp given.  No vomiting

## 2015-09-04 NOTE — Anesthesia Preprocedure Evaluation (Signed)
Anesthesia Evaluation  Patient identified by MRN, date of birth, ID band Patient awake    Reviewed: Allergy & Precautions, NPO status , Patient's Chart, lab work & pertinent test results  History of Anesthesia Complications Negative for: history of anesthetic complications (shaking with nerve block)  Airway Mallampati: II       Dental  (+) Implants   Pulmonary neg pulmonary ROS, former smoker,           Cardiovascular      Neuro/Psych  Neuromuscular disease (peripheral neuropathy)    GI/Hepatic negative GI ROS, Neg liver ROS, GERD  Medicated and Controlled,  Endo/Other  negative endocrine ROS  Renal/GU negative Renal ROS     Musculoskeletal  (+) Arthritis , Osteoarthritis,    Abdominal   Peds  Hematology   Anesthesia Other Findings   Reproductive/Obstetrics                             Anesthesia Physical Anesthesia Plan  ASA: II  Anesthesia Plan:    Post-op Pain Management:    Induction: Intravenous  Airway Management Planned: Mask and Nasal Cannula  Additional Equipment:   Intra-op Plan:   Post-operative Plan:   Informed Consent: I have reviewed the patients History and Physical, chart, labs and discussed the procedure including the risks, benefits and alternatives for the proposed anesthesia with the patient or authorized representative who has indicated his/her understanding and acceptance.     Plan Discussed with:   Anesthesia Plan Comments:         Anesthesia Quick Evaluation

## 2015-09-04 NOTE — Discharge Instructions (Signed)
Eye Surgery Discharge Instructions  Expect mild scratchy sensation or mild soreness. DO NOT RUB YOUR EYE!  The day of surgery:  Minimal physical activity, but bed rest is not required  No reading, computer work, or close hand work  No bending, lifting, or straining.  May watch TV  For 24 hours:  No driving, legal decisions, or alcoholic beverages  Safety precautions  Eat anything you prefer: It is better to start with liquids, then soup then solid foods.  _____ Eye patch should be worn until postoperative exam tomorrow.  __x__ Solar shield eyeglasses should be worn for comfort in the sunlight/patch while sleeping  Resume all regular medications including aspirin or Coumadin if these were discontinued prior to surgery. You may shower, bathe, shave, or wash your hair. Tylenol may be taken for mild discomfort.  Call your doctor if you experience significant pain, nausea, or vomiting, fever > 101 or other signs of infection. [phone] or [phone] Specific instructions:  Follow-up Information    Follow up with Tim Lair, MD On 09/05/2015.   Specialty:  Ophthalmology   Why:  10:10   Contact information:   [address] [place] [address] [phone]

## 2015-09-17 ENCOUNTER — Other Ambulatory Visit (INDEPENDENT_AMBULATORY_CARE_PROVIDER_SITE_OTHER): Payer: Medicare Other

## 2015-09-17 DIAGNOSIS — E538 Deficiency of other specified B group vitamins: Secondary | ICD-10-CM

## 2015-09-17 LAB — VITAMIN B12: Vitamin B-12: 1102 pg/mL — ABNORMAL HIGH (ref 211–911)

## 2015-09-18 ENCOUNTER — Encounter: Payer: Self-pay | Admitting: *Deleted

## 2015-10-03 ENCOUNTER — Encounter
Admission: RE | Admit: 2015-10-03 | Discharge: 2015-10-03 | Disposition: A | Discharge status: Home / Self Care | Payer: Medicare Other | Source: Ambulatory Visit | Attending: Orthopedic Surgery | Admitting: Orthopedic Surgery

## 2015-10-03 DIAGNOSIS — Z01818 Encounter for other preprocedural examination: Secondary | ICD-10-CM | POA: Diagnosis not present

## 2015-10-03 DIAGNOSIS — R0602 Shortness of breath: Secondary | ICD-10-CM | POA: Diagnosis not present

## 2015-10-03 DIAGNOSIS — M179 Osteoarthritis of knee, unspecified: Secondary | ICD-10-CM | POA: Insufficient documentation

## 2015-10-03 HISTORY — DX: Other specified postprocedural states: Z98.890

## 2015-10-03 HISTORY — DX: Nausea with vomiting, unspecified: R11.2

## 2015-10-03 LAB — SURGICAL PCR SCREEN
MRSA, PCR: NEGATIVE
Staphylococcus aureus: NEGATIVE

## 2015-10-03 LAB — CBC
HCT: 39.1 % (ref 35.0–47.0)
Hemoglobin: 13.6 g/dL (ref 12.0–16.0)
MCH: 30.1 pg (ref 26.0–34.0)
MCHC: 34.8 g/dL (ref 32.0–36.0)
MCV: 86.5 fL (ref 80.0–100.0)
Platelets: 271 10*3/uL (ref 150–440)
RBC: 4.52 MIL/uL (ref 3.80–5.20)
RDW: 13.9 % (ref 11.5–14.5)
WBC: 7.4 10*3/uL (ref 3.6–11.0)

## 2015-10-03 LAB — BASIC METABOLIC PANEL
Anion gap: 7 (ref 5–15)
BUN: 11 mg/dL (ref 6–20)
CO2: 24 mmol/L (ref 22–32)
Calcium: 9.1 mg/dL (ref 8.9–10.3)
Chloride: 106 mmol/L (ref 101–111)
Creatinine, Ser: 0.78 mg/dL (ref 0.44–1.00)
GFR calc Af Amer: >60 mL/min (ref >60)
GFR calc non Af Amer: >60 mL/min (ref >60)
Glucose, Bld: 89 mg/dL (ref 65–99)
Potassium: 3.6 mmol/L (ref 3.5–5.1)
Sodium: 137 mmol/L (ref 135–145)

## 2015-10-03 LAB — SEDIMENTATION RATE: Sed Rate: 31 mm/hr — ABNORMAL HIGH (ref 0–30)

## 2015-10-03 LAB — TYPE AND SCREEN
ABO/RH(D): O POS
Antibody Screen: NEGATIVE

## 2015-10-03 LAB — APTT: aPTT: 32 seconds (ref 24–36)

## 2015-10-03 LAB — ABO/RH: ABO/RH(D): O POS

## 2015-10-03 LAB — PROTIME-INR
INR: 0.98
Prothrombin Time: 13.2 seconds (ref 11.4–15.0)

## 2015-10-03 NOTE — Patient Instructions (Signed)
  Your procedure is scheduled on: Monday Nov. 14, 2016. Report to Same Day Surgery. To find out your arrival time please call [phone] between 1PM - 3PM on Friday Nov.11, 2016.  Remember: Instructions that are not followed completely may result in serious medical risk, up to and including death, or upon the discretion of your surgeon and anesthesiologist your surgery may need to be rescheduled.    _x___ 1. Do not eat food or drink liquids after midnight. No gum chewing or hard candies.     ____ 2. No Alcohol for 24 hours before or after surgery.   ____ 3. Bring all medications with you on the day of surgery if instructed.    _x___ 4. Notify your doctor if there is any change in your medical condition     (cold, fever, infections).     Do not wear jewelry, make-up, hairpins, clips or nail polish.  Do not wear lotions, powders, or perfumes. You may wear deodorant.  Do not shave 48 hours prior to surgery. Men may shave face and neck.  Do not bring valuables to the hospital.    [facility] is not responsible for any belongings or valuables.               Contacts, dentures or bridgework may not be worn into surgery.  Leave your suitcase in the car. After surgery it may be brought to your room.  For patients admitted to the hospital, discharge time is determined by your treatment team.   Patients discharged the day of surgery will not be allowed to drive home.    Please read over the following fact sheets that you were given:   [facility] Preparing for Surgery  ____ Take these medicines the morning of surgery with A SIP OF WATER: NONE     ____ Fleet Enema (as directed)   _x__ Use CHG Soap as directed  ____ Use inhalers on the day of surgery  ____ Stop metformin 2 days prior to surgery    ____ Take 1/2 of usual insulin dose the night before surgery and none on the morning of surgery.   ____ Stop Coumadin/Plavix/aspirin on does not apply.  _x__ Stop Anti-inflammatories  BC powder now.Tylenol is ok to take for pain.   ____ Stop supplements until after surgery.    ____ Bring C-Pap to the hospital.

## 2015-10-05 LAB — URINALYSIS COMPLETE WITH MICROSCOPIC (ARMC ONLY)
Bilirubin Urine: NEGATIVE
Glucose, UA: NEGATIVE mg/dL
Hgb urine dipstick: NEGATIVE
Ketones, ur: NEGATIVE mg/dL
Nitrite: NEGATIVE
Protein, ur: NEGATIVE mg/dL
RBC / HPF: NONE SEEN RBC/hpf (ref 0–5)
Specific Gravity, Urine: 1.021 (ref 1.005–1.030)
pH: 5 (ref 5.0–8.0)

## 2015-10-06 LAB — URINE CULTURE
Culture: NO GROWTH
Special Requests: NORMAL

## 2015-10-15 ENCOUNTER — Inpatient Hospital Stay
Admission: RE | Admit: 2015-10-15 | Discharge: 2015-10-18 | DRG: 470 | Disposition: A | Discharge status: Skilled Nursing Facility | Payer: Medicare Other | Source: Ambulatory Visit | Attending: Orthopedic Surgery | Admitting: Orthopedic Surgery

## 2015-10-15 ENCOUNTER — Inpatient Hospital Stay: Payer: Medicare Other | Admitting: Anesthesiology

## 2015-10-15 ENCOUNTER — Encounter
Admission: RE | Disposition: A | Discharge status: Skilled Nursing Facility | Payer: Self-pay | Source: Ambulatory Visit | Attending: Orthopedic Surgery

## 2015-10-15 ENCOUNTER — Encounter: Payer: Self-pay | Admitting: Orthopedic Surgery

## 2015-10-15 ENCOUNTER — Inpatient Hospital Stay: Payer: Medicare Other

## 2015-10-15 DIAGNOSIS — M810 Age-related osteoporosis without current pathological fracture: Secondary | ICD-10-CM | POA: Diagnosis not present

## 2015-10-15 DIAGNOSIS — Z801 Family history of malignant neoplasm of trachea, bronchus and lung: Secondary | ICD-10-CM

## 2015-10-15 DIAGNOSIS — R350 Frequency of micturition: Secondary | ICD-10-CM | POA: Diagnosis present

## 2015-10-15 DIAGNOSIS — Z780 Asymptomatic menopausal state: Secondary | ICD-10-CM | POA: Diagnosis not present

## 2015-10-15 DIAGNOSIS — Z7982 Long term (current) use of aspirin: Secondary | ICD-10-CM

## 2015-10-15 DIAGNOSIS — R2689 Other abnormalities of gait and mobility: Secondary | ICD-10-CM | POA: Diagnosis not present

## 2015-10-15 DIAGNOSIS — M179 Osteoarthritis of knee, unspecified: Secondary | ICD-10-CM | POA: Diagnosis not present

## 2015-10-15 DIAGNOSIS — Z96652 Presence of left artificial knee joint: Secondary | ICD-10-CM | POA: Diagnosis not present

## 2015-10-15 DIAGNOSIS — M25562 Pain in left knee: Secondary | ICD-10-CM | POA: Diagnosis not present

## 2015-10-15 DIAGNOSIS — E559 Vitamin D deficiency, unspecified: Secondary | ICD-10-CM | POA: Diagnosis present

## 2015-10-15 DIAGNOSIS — Z96659 Presence of unspecified artificial knee joint: Secondary | ICD-10-CM

## 2015-10-15 DIAGNOSIS — E538 Deficiency of other specified B group vitamins: Secondary | ICD-10-CM | POA: Diagnosis not present

## 2015-10-15 DIAGNOSIS — Z79899 Other long term (current) drug therapy: Secondary | ICD-10-CM

## 2015-10-15 DIAGNOSIS — M6281 Muscle weakness (generalized): Secondary | ICD-10-CM | POA: Diagnosis not present

## 2015-10-15 DIAGNOSIS — M1712 Unilateral primary osteoarthritis, left knee: Principal | ICD-10-CM | POA: Diagnosis present

## 2015-10-15 DIAGNOSIS — Z471 Aftercare following joint replacement surgery: Secondary | ICD-10-CM | POA: Diagnosis not present

## 2015-10-15 DIAGNOSIS — E785 Hyperlipidemia, unspecified: Secondary | ICD-10-CM | POA: Diagnosis present

## 2015-10-15 DIAGNOSIS — Z800 Family history of malignant neoplasm of digestive organs: Secondary | ICD-10-CM

## 2015-10-15 DIAGNOSIS — D519 Vitamin B12 deficiency anemia, unspecified: Secondary | ICD-10-CM | POA: Diagnosis present

## 2015-10-15 DIAGNOSIS — Z96642 Presence of left artificial hip joint: Secondary | ICD-10-CM | POA: Diagnosis not present

## 2015-10-15 DIAGNOSIS — K219 Gastro-esophageal reflux disease without esophagitis: Secondary | ICD-10-CM | POA: Diagnosis not present

## 2015-10-15 HISTORY — PX: KNEE ARTHROPLASTY: SHX992

## 2015-10-15 SURGERY — ARTHROPLASTY, KNEE, TOTAL, USING IMAGELESS COMPUTER-ASSISTED NAVIGATION
Anesthesia: Spinal | Site: Knee | Laterality: Left

## 2015-10-15 MED ORDER — LACTATED RINGERS IV SOLN
INTRAVENOUS | Status: DC
Start: 2015-10-15 — End: 2015-10-15
  Administered 2015-10-15 (×3): via INTRAVENOUS

## 2015-10-15 MED ORDER — LOPERAMIDE HCL 2 MG PO CAPS: 2.0000 mg | ORAL_CAPSULE | ORAL | Status: DC | PRN

## 2015-10-15 MED ORDER — ACETAMINOPHEN 10 MG/ML IV SOLN
INTRAVENOUS | Status: DC | PRN
  Administered 2015-10-15: 1000 mg via INTRAVENOUS

## 2015-10-15 MED ORDER — CELECOXIB 200 MG PO CAPS
200.0000 mg | ORAL_CAPSULE | Freq: Two times a day (BID) | ORAL | Status: DC
  Administered 2015-10-15 – 2015-10-18 (×6): 200 mg via ORAL
  Filled 2015-10-15 (×6): qty 1

## 2015-10-15 MED ORDER — ONDANSETRON HCL 4 MG/2ML IJ SOLN
4.0000 mg | Freq: Four times a day (QID) | INTRAMUSCULAR | Status: DC | PRN
  Administered 2015-10-16: 4 mg via INTRAVENOUS
  Filled 2015-10-15: qty 2

## 2015-10-15 MED ORDER — FENTANYL CITRATE (PF) 100 MCG/2ML IJ SOLN
25.0000 ug | INTRAMUSCULAR | Status: DC | PRN
  Administered 2015-10-15 (×3): 25 ug via INTRAVENOUS

## 2015-10-15 MED ORDER — ENOXAPARIN SODIUM 30 MG/0.3ML ~~LOC~~ SOLN
30.0000 mg | Freq: Two times a day (BID) | SUBCUTANEOUS | Status: DC
  Administered 2015-10-16 – 2015-10-18 (×5): 30 mg via SUBCUTANEOUS
  Filled 2015-10-15 (×5): qty 0.3

## 2015-10-15 MED ORDER — BISACODYL 10 MG RE SUPP
10.0000 mg | Freq: Every day | RECTAL | Status: DC | PRN
  Administered 2015-10-17: 10 mg via RECTAL
  Filled 2015-10-15: qty 1

## 2015-10-15 MED ORDER — SODIUM CHLORIDE 0.9 % IV SOLN
1000.0000 mg | Freq: Once | INTRAVENOUS | Status: DC
  Administered 2015-10-15: 1000 mg via INTRAVENOUS
  Filled 2015-10-15: qty 10

## 2015-10-15 MED ORDER — MIDAZOLAM HCL 5 MG/5ML IJ SOLN
INTRAMUSCULAR | Status: DC | PRN
  Administered 2015-10-15 (×2): 1 mg via INTRAVENOUS

## 2015-10-15 MED ORDER — MORPHINE SULFATE (PF) 2 MG/ML IV SOLN
2.0000 mg | INTRAVENOUS | Status: DC | PRN
  Administered 2015-10-15 – 2015-10-16 (×3): 2 mg via INTRAVENOUS
  Filled 2015-10-15 (×3): qty 1

## 2015-10-15 MED ORDER — VITAMIN B-12 1000 MCG PO TABS
1000.0000 ug | ORAL_TABLET | Freq: Every day | ORAL | Status: DC
  Administered 2015-10-16 – 2015-10-18 (×3): 1000 ug via ORAL
  Filled 2015-10-15 (×3): qty 1

## 2015-10-15 MED ORDER — FENTANYL CITRATE (PF) 100 MCG/2ML IJ SOLN: 25.0000 ug | INTRAMUSCULAR | Status: DC | PRN

## 2015-10-15 MED ORDER — ALUM & MAG HYDROXIDE-SIMETH 200-200-20 MG/5ML PO SUSP: 30.0000 mL | ORAL | Status: DC | PRN

## 2015-10-15 MED ORDER — ACETAMINOPHEN 325 MG PO TABS: 650.0000 mg | ORAL_TABLET | Freq: Four times a day (QID) | ORAL | Status: DC | PRN

## 2015-10-15 MED ORDER — CEFAZOLIN SODIUM-DEXTROSE 2-3 GM-% IV SOLR
2.0000 g | Freq: Once | INTRAVENOUS | Status: AC
  Administered 2015-10-15: 2 g via INTRAVENOUS

## 2015-10-15 MED ORDER — BUPIVACAINE-EPINEPHRINE 0.25% -1:200000 IJ SOLN
INTRAMUSCULAR | Status: DC | PRN
  Administered 2015-10-15: 30 mL

## 2015-10-15 MED ORDER — PANTOPRAZOLE SODIUM 40 MG PO TBEC
40.0000 mg | DELAYED_RELEASE_TABLET | Freq: Two times a day (BID) | ORAL | Status: DC
  Administered 2015-10-15 – 2015-10-18 (×6): 40 mg via ORAL
  Filled 2015-10-15 (×6): qty 1

## 2015-10-15 MED ORDER — METOCLOPRAMIDE HCL 10 MG PO TABS
10.0000 mg | ORAL_TABLET | Freq: Three times a day (TID) | ORAL | Status: AC
  Administered 2015-10-15 – 2015-10-17 (×7): 10 mg via ORAL
  Filled 2015-10-15 (×7): qty 1

## 2015-10-15 MED ORDER — SODIUM CHLORIDE 0.9 % IV SOLN
INTRAVENOUS | Status: DC
  Administered 2015-10-15 – 2015-10-16 (×2): via INTRAVENOUS

## 2015-10-15 MED ORDER — SODIUM CHLORIDE 0.9 % IV SOLN
1000.0000 mg | INTRAVENOUS | Status: DC | PRN
  Administered 2015-10-15: 1000 mg via INTRAVENOUS

## 2015-10-15 MED ORDER — MENTHOL 3 MG MT LOZG
1.0000 | LOZENGE | OROMUCOSAL | Status: DC | PRN
  Filled 2015-10-15: qty 9

## 2015-10-15 MED ORDER — SODIUM CHLORIDE 0.9 % IJ SOLN
INTRAMUSCULAR | Status: AC
  Filled 2015-10-15: qty 3

## 2015-10-15 MED ORDER — FLEET ENEMA 7-19 GM/118ML RE ENEM: 1.0000 | ENEMA | Freq: Once | RECTAL | Status: DC | PRN

## 2015-10-15 MED ORDER — SODIUM CHLORIDE 0.9 % IV SOLN
INTRAVENOUS | Status: DC | PRN
  Administered 2015-10-15: 60 mL

## 2015-10-15 MED ORDER — ONDANSETRON HCL 4 MG PO TABS: 4.0000 mg | ORAL_TABLET | Freq: Four times a day (QID) | ORAL | Status: DC | PRN

## 2015-10-15 MED ORDER — FAMOTIDINE 20 MG PO TABS
20.0000 mg | ORAL_TABLET | Freq: Once | ORAL | Status: AC
  Administered 2015-10-15: 20 mg via ORAL

## 2015-10-15 MED ORDER — FERROUS SULFATE 325 (65 FE) MG PO TABS
325.0000 mg | ORAL_TABLET | Freq: Two times a day (BID) | ORAL | Status: DC
  Administered 2015-10-16 – 2015-10-18 (×4): 325 mg via ORAL
  Filled 2015-10-15 (×4): qty 1

## 2015-10-15 MED ORDER — ONDANSETRON HCL 4 MG/2ML IJ SOLN
4.0000 mg | Freq: Once | INTRAMUSCULAR | Status: AC | PRN
  Administered 2015-10-15: 4 mg via INTRAVENOUS

## 2015-10-15 MED ORDER — OXYCODONE HCL 5 MG PO TABS
5.0000 mg | ORAL_TABLET | ORAL | Status: DC | PRN
  Administered 2015-10-15 – 2015-10-18 (×7): 5 mg via ORAL
  Filled 2015-10-15 (×7): qty 1

## 2015-10-15 MED ORDER — ACETAMINOPHEN 10 MG/ML IV SOLN
1000.0000 mg | Freq: Four times a day (QID) | INTRAVENOUS | Status: AC
  Administered 2015-10-15 – 2015-10-16 (×3): 1000 mg via INTRAVENOUS
  Filled 2015-10-15 (×4): qty 100

## 2015-10-15 MED ORDER — CEFAZOLIN SODIUM-DEXTROSE 2-3 GM-% IV SOLR
2.0000 g | Freq: Four times a day (QID) | INTRAVENOUS | Status: AC
  Administered 2015-10-15 – 2015-10-16 (×4): 2 g via INTRAVENOUS
  Filled 2015-10-15 (×4): qty 50

## 2015-10-15 MED ORDER — DIPHENHYDRAMINE HCL 12.5 MG/5ML PO ELIX: 12.5000 mg | ORAL_SOLUTION | ORAL | Status: DC | PRN

## 2015-10-15 MED ORDER — PHENOL 1.4 % MT LIQD
1.0000 | OROMUCOSAL | Status: DC | PRN
  Filled 2015-10-15: qty 177

## 2015-10-15 MED ORDER — ONDANSETRON HCL 4 MG/2ML IJ SOLN: 4.0000 mg | Freq: Once | INTRAMUSCULAR | Status: DC | PRN

## 2015-10-15 MED ORDER — TRANEXAMIC ACID 1000 MG/10ML IV SOLN
INTRAVENOUS | Status: AC
  Filled 2015-10-15: qty 10

## 2015-10-15 MED ORDER — ACETAMINOPHEN 650 MG RE SUPP: 650.0000 mg | Freq: Four times a day (QID) | RECTAL | Status: DC | PRN

## 2015-10-15 MED ORDER — BUPIVACAINE HCL (PF) 0.5 % IJ SOLN
INTRAMUSCULAR | Status: DC | PRN
  Administered 2015-10-15: 3 mL

## 2015-10-15 MED ORDER — SODIUM CHLORIDE 0.9 % IV SOLN
Freq: Once | INTRAVENOUS | Status: AC
  Administered 2015-10-15: 19:00:00 via INTRAVENOUS

## 2015-10-15 MED ORDER — MAGNESIUM HYDROXIDE 400 MG/5ML PO SUSP
30.0000 mL | Freq: Every day | ORAL | Status: DC | PRN
  Administered 2015-10-16 – 2015-10-17 (×2): 30 mL via ORAL
  Filled 2015-10-15 (×2): qty 30

## 2015-10-15 MED ORDER — NEOMYCIN-POLYMYXIN B GU 40-200000 IR SOLN
Status: DC | PRN
  Administered 2015-10-15: 14 mL

## 2015-10-15 MED ORDER — FENTANYL CITRATE (PF) 100 MCG/2ML IJ SOLN
INTRAMUSCULAR | Status: AC
  Administered 2015-10-15: 25 ug
  Filled 2015-10-15: qty 2

## 2015-10-15 MED ORDER — SENNOSIDES-DOCUSATE SODIUM 8.6-50 MG PO TABS
1.0000 | ORAL_TABLET | Freq: Two times a day (BID) | ORAL | Status: DC
  Administered 2015-10-15 – 2015-10-18 (×5): 1 via ORAL
  Filled 2015-10-15 (×6): qty 1

## 2015-10-15 MED ORDER — VITAMIN D 1000 UNITS PO TABS
4000.0000 [IU] | ORAL_TABLET | Freq: Every day | ORAL | Status: DC
  Administered 2015-10-16 – 2015-10-18 (×3): 4000 [IU] via ORAL
  Filled 2015-10-15 (×3): qty 4

## 2015-10-15 MED ORDER — PROPOFOL 500 MG/50ML IV EMUL
INTRAVENOUS | Status: DC | PRN
  Administered 2015-10-15: 50 ug/kg/min via INTRAVENOUS

## 2015-10-15 MED ORDER — KETAMINE HCL 50 MG/ML IJ SOLN
INTRAMUSCULAR | Status: DC | PRN
  Administered 2015-10-15 (×2): 25 mg via INTRAMUSCULAR

## 2015-10-15 MED ORDER — ONDANSETRON HCL 4 MG/2ML IJ SOLN
INTRAMUSCULAR | Status: DC | PRN
  Administered 2015-10-15: 4 mg via INTRAVENOUS

## 2015-10-15 MED ORDER — TRAMADOL HCL 50 MG PO TABS
50.0000 mg | ORAL_TABLET | ORAL | Status: DC | PRN
  Administered 2015-10-18 (×2): 50 mg via ORAL
  Filled 2015-10-15 (×2): qty 1

## 2015-10-15 MED ORDER — ONDANSETRON HCL 4 MG/2ML IJ SOLN
INTRAMUSCULAR | Status: AC
  Filled 2015-10-15: qty 2

## 2015-10-15 SURGICAL SUPPLY — 59 items
AUTOTRANSFUS HAS 1/8 (MISCELLANEOUS) ×2
BATTERY INSTRU NAVIGATION (MISCELLANEOUS) ×8 IMPLANT
BLADE SAW 1 (BLADE) ×2 IMPLANT
BLADE SAW 1/2 (BLADE) ×2 IMPLANT
BTRY SRG DRVR LF (MISCELLANEOUS) ×4
CANISTER SUCT 1200ML W/VALVE (MISCELLANEOUS) ×2 IMPLANT
CANISTER SUCT 3000ML (MISCELLANEOUS) ×4 IMPLANT
CAP KNEE TOTAL 3 SIGMA ×1 IMPLANT
CATH TRAY METER 16FR LF (MISCELLANEOUS) ×2 IMPLANT
CEMENT HV SMART SET (Cement) ×4 IMPLANT
COOLER POLAR GLACIER W/PUMP (MISCELLANEOUS) ×2 IMPLANT
DRAPE SHEET LG 3/4 BI-LAMINATE (DRAPES) ×2 IMPLANT
DRSG DERMACEA 8X12 NADH (GAUZE/BANDAGES/DRESSINGS) ×2 IMPLANT
DRSG OPSITE POSTOP 4X14 (GAUZE/BANDAGES/DRESSINGS) ×2 IMPLANT
DRSG TEGADERM 4X4.75 (GAUZE/BANDAGES/DRESSINGS) ×2 IMPLANT
DURAPREP 26ML APPLICATOR (WOUND CARE) ×4 IMPLANT
ELECT CAUTERY BLADE 6.4 (BLADE) ×2 IMPLANT
EX-PIN ORTHOLOCK NAV 4X150 (PIN) ×4 IMPLANT
GLOVE BIOGEL M STRL SZ7.5 (GLOVE) ×4 IMPLANT
GLOVE INDICATOR 8.0 STRL GRN (GLOVE) ×2 IMPLANT
GLOVE SURG 9.0 ORTHO LTXF (GLOVE) ×2 IMPLANT
GLOVE SURG ORTHO 9.0 STRL STRW (GLOVE) ×2 IMPLANT
GOWN STRL REUS W/ TWL LRG LVL3 (GOWN DISPOSABLE) ×2 IMPLANT
GOWN STRL REUS W/ TWL LRG LVL4 (GOWN DISPOSABLE) ×1 IMPLANT
GOWN STRL REUS W/TWL 2XL LVL3 (GOWN DISPOSABLE) ×2 IMPLANT
GOWN STRL REUS W/TWL LRG LVL3 (GOWN DISPOSABLE) ×4
GOWN STRL REUS W/TWL LRG LVL4 (GOWN DISPOSABLE) ×2
HANDPIECE SUCTION TUBG SURGILV (MISCELLANEOUS) ×2 IMPLANT
HOLDER FOLEY CATH W/STRAP (MISCELLANEOUS) ×2 IMPLANT
HOOD PEEL AWAY FLYTE STAYCOOL (MISCELLANEOUS) ×4 IMPLANT
KIT RM TURNOVER STRD PROC AR (KITS) ×2 IMPLANT
KNIFE SCULPS 14X20 (INSTRUMENTS) ×2 IMPLANT
NDL SAFETY 18GX1.5 (NEEDLE) ×2 IMPLANT
NDL SPNL 20GX3.5 QUINCKE YW (NEEDLE) ×1 IMPLANT
NEEDLE SPNL 20GX3.5 QUINCKE YW (NEEDLE) ×2 IMPLANT
NS IRRIG 500ML POUR BTL (IV SOLUTION) ×2 IMPLANT
OINTMENT BETADINE 1.5GM (MISCELLANEOUS) ×2 IMPLANT
PACK TOTAL KNEE (MISCELLANEOUS) ×2 IMPLANT
PAD GROUND ADULT SPLIT (MISCELLANEOUS) ×2 IMPLANT
PAD WRAPON POLAR KNEE (MISCELLANEOUS) ×1 IMPLANT
PIN FIXATION 1/8DIA X 3INL (PIN) ×2 IMPLANT
SOL .9 NS 3000ML IRR  AL (IV SOLUTION) ×1
SOL .9 NS 3000ML IRR AL (IV SOLUTION) ×1
SOL .9 NS 3000ML IRR UROMATIC (IV SOLUTION) ×1 IMPLANT
SOL PREP PVP 2OZ (MISCELLANEOUS) ×2
SOLUTION PREP PVP 2OZ (MISCELLANEOUS) ×1 IMPLANT
SPONGE DRAIN TRACH 4X4 STRL 2S (GAUZE/BANDAGES/DRESSINGS) ×2 IMPLANT
STAPLER SKIN PROX 35W (STAPLE) ×2 IMPLANT
SUCTION FRAZIER TIP 10 FR DISP (SUCTIONS) ×2 IMPLANT
SUT VIC AB 0 CT1 36 (SUTURE) ×2 IMPLANT
SUT VIC AB 1 CT1 36 (SUTURE) ×4 IMPLANT
SUT VIC AB 2-0 CT2 27 (SUTURE) ×2 IMPLANT
SYR 20CC LL (SYRINGE) ×2 IMPLANT
SYR 30ML LL (SYRINGE) ×2 IMPLANT
SYR 50ML LL SCALE MARK (SYRINGE) ×2 IMPLANT
SYSTEM AUTOTRANSFUS DUAL TROCR (MISCELLANEOUS) ×1 IMPLANT
TOWEL OR 17X26 4PK STRL BLUE (TOWEL DISPOSABLE) ×2 IMPLANT
TOWER CARTRIDGE SMART MIX (DISPOSABLE) ×2 IMPLANT
WRAPON POLAR PAD KNEE (MISCELLANEOUS) ×2

## 2015-10-15 NOTE — H&P (Signed)
The patient has been re-examined, and the chart reviewed, and there have been no interval changes to the documented history and physical.    The risks, benefits, and alternatives have been discussed at length. The patient expressed understanding of the risks benefits and agreed with plans for surgical intervention.  James P. Hooten, Jr. M.D.    

## 2015-10-15 NOTE — Transfer of Care (Signed)
Immediate Anesthesia Transfer of Care Note  Patient: Julie [name]  Procedure(s) Performed: Procedure(s): COMPUTER ASSISTED TOTAL KNEE ARTHROPLASTY (Left)  Patient Location: PACU  Anesthesia Type:Spinal  Level of Consciousness: sedated  Airway & Oxygen Therapy: Patient Spontanous Breathing and Patient connected to face mask oxygen  Post-op Assessment: Report given to RN and Post -op Vital signs reviewed and stable  Post vital signs: Reviewed and stable  Last Vitals:  Filed Vitals:   10/15/15 0959  BP: 139/93  Pulse: 78  Temp: 36.4 C  Resp: 16    Complications: No apparent anesthesia complications

## 2015-10-15 NOTE — Brief Op Note (Signed)
10/15/2015  2:44 PM  PATIENT:  Julie [name]  81 y.o. female  PRE-OPERATIVE DIAGNOSIS:  DEGENERATIVE OSTEOARTHRITIS LEFT KNEE  POST-OPERATIVE DIAGNOSIS:  DEGENERATIVE OSTEOARTHRITIS LEFT KNEE  PROCEDURE:  Procedure(s): COMPUTER ASSISTED TOTAL KNEE ARTHROPLASTY (Left)  SURGEON:  Surgeon(s) and Role:    * Dereck Leep, MD - Primary  PHYSICIAN ASSISTANT: Vance Peper, PA   ANESTHESIA:   spinal  EBL:  Total I/O In: 1500 [I.V.:1500] Out: 300 [Urine:250; Blood:50]  BLOOD ADMINISTERED:none  DRAINS: 2 medium drains to a reinfusion system   LOCAL MEDICATIONS USED:  MARCAINE    and OTHER Exparel  SPECIMEN:  No Specimen  DISPOSITION OF SPECIMEN:  N/A  COUNTS:  YES  TOURNIQUET:   70 minutes  DICTATION: .Dragon Dictation  PLAN OF CARE: Admit to inpatient   PATIENT DISPOSITION:  PACU - hemodynamically stable.   Delay start of Pharmacological VTE agent (>24hrs) due to surgical blood loss or risk of bleeding: yes

## 2015-10-15 NOTE — Anesthesia Preprocedure Evaluation (Addendum)
Anesthesia Evaluation  Patient identified by MRN, date of birth, ID band Patient awake    Reviewed: Allergy & Precautions, H&P , NPO status , Patient's Chart, lab work & pertinent test results, reviewed documented beta blocker date and time   History of Anesthesia Complications (+) PONV and history of anesthetic complications  Airway Mallampati: II  TM Distance: >3 FB Neck ROM: full    Dental no notable dental hx.    Pulmonary neg pulmonary ROS, shortness of breath, former smoker,    Pulmonary exam normal breath sounds clear to auscultation       Cardiovascular Exercise Tolerance: Good negative cardio ROS   Rhythm:regular Rate:Normal     Neuro/Psych  Neuromuscular disease negative neurological ROS  negative psych ROS   GI/Hepatic negative GI ROS, Neg liver ROS, GERD  ,  Endo/Other  negative endocrine ROS  Renal/GU negative Renal ROS  negative genitourinary   Musculoskeletal   Abdominal   Peds  Hematology negative hematology ROS (+)   Anesthesia Other Findings   Reproductive/Obstetrics negative OB ROS                            Anesthesia Physical Anesthesia Plan  ASA: III  Anesthesia Plan: Spinal   Post-op Pain Management:    Induction:   Airway Management Planned:   Additional Equipment:   Intra-op Plan:   Post-operative Plan:   Informed Consent: I have reviewed the patients History and Physical, chart, labs and discussed the procedure including the risks, benefits and alternatives for the proposed anesthesia with the patient or authorized representative who has indicated his/her understanding and acceptance.   Dental Advisory Given  Plan Discussed with: CRNA  Anesthesia Plan Comments:        Anesthesia Quick Evaluation

## 2015-10-15 NOTE — Op Note (Signed)
OPERATIVE NOTE  DATE OF SURGERY:  10/15/2015  PATIENT NAME:  Julie [name]   DOB: [birth year]  MRN: [id number]  PRE-OPERATIVE DIAGNOSIS: Degenerative arthrosis of the left knee, primary  POST-OPERATIVE DIAGNOSIS:  Same  PROCEDURE:  Left total knee arthroplasty using computer-assisted navigation  SURGEON:  Marciano Sequin. M.D.  ASSISTANT:  Vance Peper, PA (present and scrubbed throughout the case, critical for assistance with exposure, retraction, instrumentation, and closure)  ANESTHESIA: spinal  ESTIMATED BLOOD LOSS: 50 mL  FLUIDS REPLACED: 1500 mL of crystalloid  TOURNIQUET TIME: 70 minutes  DRAINS: 2 medium drains to a reinfusion system  SOFT TISSUE RELEASES: Anterior cruciate ligament, posterior cruciate ligament, deep medial collateral ligament, patellofemoral ligament   IMPLANTS UTILIZED: DePuy PFC Sigma size 2.5 posterior stabilized femoral component (cemented), size 2 MBT tibial component (cemented), 35 mm 3 peg oval dome patella (cemented), and a 10 mm stabilized rotating platform polyethylene insert.  INDICATIONS FOR SURGERY: Julie [name] is a 81 y.o. year old female with a long history of progressive knee pain. X-rays demonstrated severe degenerative changes in tricompartmental fashion. The patient had not seen any significant improvement despite conservative nonsurgical intervention. After discussion of the risks and benefits of surgical intervention, the patient expressed understanding of the risks benefits and agree with plans for total knee arthroplasty.   The risks, benefits, and alternatives were discussed at length including but not limited to the risks of infection, bleeding, nerve injury, stiffness, blood clots, the need for revision surgery, cardiopulmonary complications, among others, and they were willing to proceed.  PROCEDURE IN DETAIL: The patient was brought into the operating room and, after adequate spinal anesthesia was achieved, a tourniquet  was placed on the patient's upper thigh. The patient's knee and leg were cleaned and prepped with alcohol and DuraPrep and draped in the usual sterile fashion. A "timeout" was performed as per usual protocol. The lower extremity was exsanguinated using an Esmarch, and the tourniquet was inflated to 300 mmHg. An anterior longitudinal incision was made followed by a standard mid vastus approach. The deep fibers of the medial collateral ligament were elevated in a subperiosteal fashion off of the medial flare of the tibia so as to maintain a continuous soft tissue sleeve. The patella was subluxed laterally and the patellofemoral ligament was incised. Inspection of the knee demonstrated severe degenerative changes with full-thickness loss of articular cartilage. Osteophytes were debrided using a rongeur. Anterior and posterior cruciate ligaments were excised. Two 4.0 mm Schanz pins were inserted in the femur and into the tibia for attachment of the array of trackers used for computer-assisted navigation. Hip center was identified using a circumduction technique. Distal landmarks were mapped using the computer. The distal femur and proximal tibia were mapped using the computer. The distal femoral cutting guide was positioned using computer-assisted navigation so as to achieve a 5 distal valgus cut. The femur was sized and it was felt that a size 2.5 femoral component was appropriate. A size 2.5 femoral cutting guide was positioned and the anterior cut was performed and verified using the computer. This was followed by completion of the posterior and chamfer cuts. Femoral cutting guide for the central box was then positioned in the center box cut was performed.  Attention was then directed to the proximal tibia. Medial and lateral menisci were excised. The extramedullary tibial cutting guide was positioned using computer-assisted navigation so as to achieve a 0 varus-valgus alignment and 0 posterior slope. The cut  was performed and verified  using the computer. The proximal tibia was sized and it was felt that a size 2 tibial tray was appropriate. Tibial and femoral trials were inserted followed by insertion of a 10 mm polyethylene insert. This allowed for excellent mediolateral soft tissue balancing both in flexion and in full extension. Finally, the patella was cut and prepared so as to accommodate a 35 mm 3 peg oval dome patella. A patella trial was placed and the knee was placed through a range of motion with excellent patellar tracking appreciated. The femoral trial was removed after debridement of posterior osteophytes. The central post-hole for the tibial component was reamed followed by insertion of a keel punch. Tibial trials were then removed. Cut surfaces of bone were irrigated with copious amounts of normal saline with antibiotic solution using pulsatile lavage and then suctioned dry. Polymethylmethacrylate cement was prepared in the usual fashion using a vacuum mixer. Cement was applied to the cut surface of the proximal tibia as well as along the undersurface of a size 2 MBT tibial component. Tibial component was positioned and impacted into place. Excess cement was removed using [employer] fast streamer. Cement was then applied to the cut surfaces of the femur as well as along the posterior flanges of the size 2.5 femoral component. The femoral component was positioned and impacted into place. Excess cement was removed using [employer] fast streamer. A 10 mm polyethylene trial was inserted and the knee was brought into full extension with steady axial compression applied. Finally, cement was applied to the backside of a 35 mm 3 peg oval dome patella and the patellar component was positioned and patellar clamp applied. Excess cement was removed using [employer] fast streamer. After adequate curing of the cement, the tourniquet was deflated after a total tourniquet time of 70 minutes. Hemostasis was achieved using electrocautery. The knee  was irrigated with copious amounts of normal saline with antibiotic solution using pulsatile lavage and then suctioned dry. 20 mL of 1.3% Exparel in 40 mL of normal saline was injected along the posterior capsule, medial and lateral gutters, and along the arthrotomy site. A 10 mm stabilized rotating platform polyethylene insert was inserted and the knee was placed through a range of motion with excellent mediolateral soft tissue balancing appreciated and excellent patellar tracking noted. 2 medium drains were placed in the wound bed and brought out through separate stab incisions to be attached to a reinfusion system. The medial parapatellar portion of the incision was reapproximated using interrupted sutures of #1 Vicryl. Subcutaneous tissue was then injected with a total of 30 cc of 0.25% Marcaine with epinephrine. Subcutaneous tissue was approximated in layers using first #0 Vicryl followed #2-0 Vicryl. The skin was approximated with skin staples. A sterile dressing was applied.  The patient tolerated the procedure well and was transported to the recovery room in stable condition.    James P. Holley Bouche., M.D.

## 2015-10-15 NOTE — Progress Notes (Signed)
Autovac converted to hemovac with output being 60cc.

## 2015-10-15 NOTE — Anesthesia Procedure Notes (Addendum)
Date/Time: 10/15/2015 11:35 AM Performed by: Nelda Marseille Pre-anesthesia Checklist: Patient identified, Emergency Drugs available, Suction available, Patient being monitored and Timeout performed Oxygen Delivery Method: Simple face mask   Spinal Patient location during procedure: OR Start time: 10/15/2015 11:35 AM End time: 10/15/2015 11:40 AM Staffing Resident/CRNA: Antavia Tandy Performed by: resident/CRNA  Preanesthetic Checklist Completed: patient identified, site marked, surgical consent, pre-op evaluation, timeout performed, IV checked, risks and benefits discussed and monitors and equipment checked Spinal Block Patient position: sitting Prep: Betadine Patient monitoring: heart rate, continuous pulse ox, blood pressure and cardiac monitor Approach: midline Location: L4-5 Injection technique: single-shot Needle Needle type: Whitacre and Introducer  Needle gauge: 24 G Needle length: 9 cm Additional Notes Negative paresthesia. Negative blood return. Positive free-flowing CSF. Expiration date of kit checked and confirmed. Patient tolerated procedure well, without complications.

## 2015-10-16 ENCOUNTER — Encounter: Payer: Self-pay | Admitting: Orthopedic Surgery

## 2015-10-16 LAB — BASIC METABOLIC PANEL
Anion gap: 6 (ref 5–15)
BUN: 13 mg/dL (ref 6–20)
CO2: 25 mmol/L (ref 22–32)
Calcium: 8 mg/dL — ABNORMAL LOW (ref 8.9–10.3)
Chloride: 109 mmol/L (ref 101–111)
Creatinine, Ser: 0.96 mg/dL (ref 0.44–1.00)
GFR calc Af Amer: >60 mL/min (ref >60)
GFR calc non Af Amer: 54 mL/min — ABNORMAL LOW (ref >60)
Glucose, Bld: 89 mg/dL (ref 65–99)
Potassium: 3.7 mmol/L (ref 3.5–5.1)
Sodium: 140 mmol/L (ref 135–145)

## 2015-10-16 LAB — CBC
HCT: 33.8 % — ABNORMAL LOW (ref 35.0–47.0)
Hemoglobin: 11.1 g/dL — ABNORMAL LOW (ref 12.0–16.0)
MCH: 28.7 pg (ref 26.0–34.0)
MCHC: 32.7 g/dL (ref 32.0–36.0)
MCV: 87.7 fL (ref 80.0–100.0)
Platelets: 212 10*3/uL (ref 150–440)
RBC: 3.85 MIL/uL (ref 3.80–5.20)
RDW: 13.6 % (ref 11.5–14.5)
WBC: 9 10*3/uL (ref 3.6–11.0)

## 2015-10-16 MED ORDER — METOCLOPRAMIDE HCL 5 MG/ML IJ SOLN
10.0000 mg | Freq: Three times a day (TID) | INTRAMUSCULAR | Status: DC | PRN
  Administered 2015-10-16: 10 mg via INTRAVENOUS
  Filled 2015-10-16: qty 2

## 2015-10-16 MED ORDER — PROMETHAZINE HCL 25 MG/ML IJ SOLN: 12.5000 mg | Freq: Once | INTRAMUSCULAR | Status: DC

## 2015-10-16 MED ORDER — PROMETHAZINE HCL 25 MG/ML IJ SOLN: 12.5000 mg | Freq: Once | INTRAMUSCULAR | Status: DC | PRN

## 2015-10-16 NOTE — Evaluation (Signed)
Occupational Therapy Evaluation Patient Details Name: Julie [name] MRN: [id number] DOB: 09-Oct-1932 Today's Date: 10/16/2015    History of Present Illness This patient is an 81 year old female who came to [facility] for a L TKR performed on 10/15/2015.   Clinical Impression   This patient is an 81 year old female who came to [facility] for a L total knee replacement.  Patient lives in a  home with her husband with 4 steps to enter with bilateral rails.  She had been independent with ADL and functional mobility until recently when her husband had to help her with mobility and secondary to knee pain. She also used a rolling walker. She now requires a great deal of assistance and would benefit from Occupational Therapy for ADL/functioal mobility training.      Follow Up Recommendations  SNF    Equipment Recommendations       Recommendations for Other Services       Precautions / Restrictions Restrictions LLE Weight Bearing: Weight bearing as tolerated      Mobility Bed Mobility                  Transfers                      Balance                                            ADL                                         General ADL Comments: Recently needed assist from husband secondary to knee pain, She now needs a great deal of assist. Patient suffering from nausea.  Taught patient the use of assistive devices for lower body dressing but did not physically practice secondary to nausea.      Vision     Perception     Praxis      Pertinent Vitals/Pain       Hand Dominance     Extremity/Trunk Assessment Upper Extremity Assessment Upper Extremity Assessment: Overall WFL for tasks assessed   Lower Extremity Assessment Lower Extremity Assessment: Defer to PT evaluation       Communication Communication Communication: No difficulties   Cognition Arousal/Alertness: Awake/alert Behavior  During Therapy: WFL for tasks assessed/performed Overall Cognitive Status: Within Functional Limits for tasks assessed                     General Comments       Exercises       Shoulder Instructions      Home Living Family/patient expects to be discharged to:: Skilled nursing facility Living Arrangements: Spouse/significant other                                      Prior Functioning/Environment Level of Independence: Needs assistance  Gait / Transfers Assistance Needed: husband assisted patient secondary to knee pain     Comments: used a rolling walker     OT Diagnosis: Acute pain   OT Problem List: Decreased range of motion;Decreased activity tolerance;Impaired balance (sitting and/or standing);Decreased knowledge of use of DME or  AE;Pain   OT Treatment/Interventions: Self-care/ADL training    OT Goals(Current goals can be found in the care plan section) Acute Rehab OT Goals Patient Stated Goal: To get some rehab OT Goal Formulation: With patient Time For Goal Achievement: 10/30/15 Potential to Achieve Goals: Good  OT Frequency: Min 1X/week   Barriers to D/C:            Co-evaluation              End of Session Equipment Utilized During Treatment:  (hip kit)  Activity Tolerance: Other (comment) (Limited by nausea.) Patient left: in bed;with call bell/phone within reach;with bed alarm set;with family/visitor present   Time: 3646-8032 OT Time Calculation (min): 23 min Charges:  OT General Charges $OT Visit: 1 Procedure OT Evaluation $Initial OT Evaluation Tier I: 1 Procedure OT Treatments $Self Care/Home Management : 8-22 mins G-Codes:    Myrene Galas, MS/OTR/L  10/16/2015, 11:54 AM

## 2015-10-16 NOTE — Progress Notes (Signed)
   Subjective: 1 Day Post-Op Procedure(s) (LRB): COMPUTER ASSISTED TOTAL KNEE ARTHROPLASTY (Left) Patient reports pain as 2 on 0-10 scale.   Patient is well, and has had no acute complaints or problems We will start therapy today.  Plan is to go Rehab after hospital stay. Prefers [place] no nausea and no vomiting; had some yesterday following surgery Patient denies any chest pains or shortness of breath. Didn't sleep well Objective: Vital signs in last 24 hours: Temp:  [95 F (35 C)-98 F (36.7 C)] 98 F (36.7 C) (11/15 0445) Pulse Rate:  [59-78] 77 (11/15 0445) Resp:  [14-19] 18 (11/15 0445) BP: (102-161)/(47-93) 102/59 mmHg (11/15 0445) SpO2:  [94 %-99 %] 95 % (11/15 0445) Weight:  [82.555 kg (182 lb)] 82.555 kg (182 lb) (11/14 1121) Pt has original dressing on leg. Heels are non tender and elevated off the bed using rolled towels Intake/Output from previous day: 11/14 0701 - 11/15 0700 In: 3478.3 [I.V.:2928.3; Blood:100; IV Piggyback:450] Out: 1310 [Urine:1260; Blood:50] Intake/Output this shift:     Recent Labs  10/16/15 0643  HGB 11.1*    Recent Labs  10/16/15 0643  WBC 9.0  RBC 3.85  HCT 33.8*  PLT 212    Recent Labs  10/16/15 0643  NA 140  K 3.7  CL 109  CO2 25  BUN 13  CREATININE 0.96  GLUCOSE 89  CALCIUM 8.0*   No results for input(s): LABPT, INR in the last 72 hours.  EXAM General - Patient is Alert, Appropriate and Oriented Extremity - Neurologically intact Neurovascular intact Sensation intact distally Intact pulses distally Dorsiflexion/Plantar flexion intact Dressing - dressing C/D/I Motor Function - intact, moving foot and toes well on exam.    Past Medical History  Diagnosis Date  . GERD (gastroesophageal reflux disease)   . Esophagitis   . Duodenitis   . Chronic leg pain   . Trochanteric bursitis   . OA (osteoarthritis) of knee   . TMJ syndrome   . Seborrheic dermatitis   . HLD (hyperlipidemia)   . Colon polyp   .  Diverticulosis of colon     internal----Dr. Vira Agar  . Hemorrhoids   . Adenoma   . IBS (irritable bowel syndrome)     with PP diarrhea  . Scoliosis   . Osteopenia   . DDD (degenerative disc disease), cervical   . Neuropathy ([place])   . Unsteady gait     FALLS EASILY  . Neuropathy ([place])   . PONV (postoperative nausea and vomiting)   . Shortness of breath dyspnea     with activity    Assessment/Plan: 1 Day Post-Op Procedure(s) (LRB): COMPUTER ASSISTED TOTAL KNEE ARTHROPLASTY (Left) Active Problems:   Total knee replacement status  Estimated body mass index is 32.25 kg/(m^2) as calculated from the following:   Height as of this encounter: $RemoveBeforeD'5\' 3"'OvHogMAHtZhSfk$  (1.6 m).   Weight as of this encounter: 82.555 kg (182 lb). Advance diet Up with therapy D/C IV fluids Discharge to [facility] thursday  Labs: reviewed. Met b not back as of this note DVT Prophylaxis - Lovenox, Foot Pumps and TED hose Weight-Bearing as tolerated to left leg D/C O2 and Pulse OX and try on Room Air Need to start working on a bowel movement  Julie [name] R. [place] 10/16/2015, 7:32 AM

## 2015-10-16 NOTE — Clinical Social Work Placement (Signed)
   CLINICAL SOCIAL WORK PLACEMENT  NOTE  Date:  10/16/2015  Patient Details  Name: Julie [name] MRN: [id number] Date of Birth: [birth year]  Clinical Social Work is seeking post-discharge placement for this patient at the [place] level of care (*CSW will initial, date and re-position this form in  chart as items are completed):  Yes   Patient/family provided with [place] Work Department's list of facilities offering this level of care within the geographic area requested by the patient (or if unable, by the patient's family).  Yes   Patient/family informed of their freedom to choose among providers that offer the needed level of care, that participate in Medicare, Medicaid or managed care program needed by the patient, have an available bed and are willing to accept the patient.  Yes   Patient/family informed of [facility]'s ownership interest in [facility] and [facility], as well as of the fact that they are under no obligation to receive care at these facilities.  PASRR submitted to EDS on 10/16/15     PASRR number received on 10/16/15     Existing PASRR number confirmed on       FL2 transmitted to all facilities in geographic area requested by pt/family on 10/16/15     FL2 transmitted to all facilities within larger geographic area on       Patient informed that his/her managed care company has contracts with or will negotiate with certain facilities, including the following:            Patient/family informed of bed offers received.  Patient chooses bed at       Physician recommends and patient chooses bed at      Patient to be transferred to   on  .  Patient to be transferred to facility by       Patient family notified on   of transfer.  Name of family member notified:        PHYSICIAN Please sign FL2     Additional Comment:    _______________________________________________ Loralyn Freshwater, LCSW 10/16/2015,  2:32 PM

## 2015-10-16 NOTE — Progress Notes (Signed)
Foley d/c'd at [facility] with 450cc

## 2015-10-16 NOTE — Progress Notes (Signed)
Physical Therapy Treatment Patient Details Name: Julie [name] MRN: [id number] DOB: 1932-10-15 Today's Date: 10/16/2015    History of Present Illness This patient is an 81 year old female who came to [facility] for a L TKR performed on 10/15/2015.    PT Comments    Pt able to progress to short distance ambulating in room with walker with assist.  Pt c/o 8/10 L knee pain but no emesis this afternoon.  Pt requires significant assist to stand but does fairly well once standing (still requires KI d/t L quad weakness).  Will continue to progress pt with ambulation per pt tolerance.   Follow Up Recommendations  SNF     Equipment Recommendations     Recommendations for Other Services       Precautions / Restrictions Precautions Precautions: Fall Required Braces or Orthoses: Knee Immobilizer - Left (until able to SLR x10 independently) Knee Immobilizer - Left: On when out of bed or walking Restrictions Weight Bearing Restrictions: Yes LLE Weight Bearing: Weight bearing as tolerated    Mobility  Bed Mobility Overal bed mobility: Needs Assistance Bed Mobility: Sit to Supine     Supine to sit:  (not assessed d/t pt already up in chair) Sit to supine: Mod assist   General bed mobility comments: assist with trunk and L LE; vc's for technique required  Transfers Overall transfer level: Needs assistance Equipment used: Rolling walker (2 wheeled) Transfers: Sit to/from [facility] Sit to Stand: Mod assist;+2 physical assistance Stand pivot transfers: Min assist;+2 safety/equipment (transfer to commode)       General transfer comment: assist required to initiate stand and come to fully upright; vc's for hand and feet placement required  Ambulation/Gait Ambulation/Gait assistance: Min assist Ambulation Distance (Feet): 10 Feet Assistive device: Rolling walker (2 wheeled) Gait Pattern/deviations: Step-to pattern Gait velocity: decreased   General Gait Details:  vc's required for gait technique and walker use; antalgic   Stairs            Wheelchair Mobility    Modified Rankin (Stroke Patients Only)       Balance Overall balance assessment: Needs assistance Sitting-balance support: Bilateral upper extremity supported;Feet supported Sitting balance-Leahy Scale: Good     Standing balance support: Bilateral upper extremity supported (on RW) Standing balance-Leahy Scale: Good                      Cognition Arousal/Alertness: Awake/alert Behavior During Therapy: WFL for tasks assessed/performed Overall Cognitive Status: Within Functional Limits for tasks assessed                      Exercises Total Joint Exercises Goniometric ROM: L knee extension 12 degrees short of neutral semi-supine in bed (tested in AM); L knee flexion 62 degrees in sitting (tested in PM)    General Comments General comments (skin integrity, edema, etc.): L LE dressing in place with hemovac  Nursing cleared pt for participation in physical therapy.  Pt agreeable to PT session.      Pertinent Vitals/Pain Pain Assessment: 0-10 Pain Score: 8  Pain Location: L knee Pain Descriptors / Indicators: Constant;Sore;Tender Pain Intervention(s): Limited activity within patient's tolerance;Monitored during session;Premedicated before session;Repositioned;Ice applied    Home Living Family/patient expects to be discharged to:: Skilled nursing facility Living Arrangements: Spouse/significant other Available Help at Discharge: Family Type of Home: House Home Access: Stairs to enter Entrance Stairs-Rails: Right;Left;Can reach both Home Layout: One level Home Equipment: Environmental consultant - 4  wheels (transport chair)      Prior Function Level of Independence: Needs assistance  Gait / Transfers Assistance Needed: husband assists pt with standing d/t knee pain; uses RW       PT Goals (current goals can now be found in the care plan section) Acute Rehab PT  Goals Patient Stated Goal: to go to rehab PT Goal Formulation: With patient Time For Goal Achievement: 2015-11-11 Potential to Achieve Goals: Good Additional Goals Additional Goal #1: L knee ROM 0-90 degrees Progress towards PT goals: Progressing toward goals    Frequency  BID    PT Plan Current plan remains appropriate    Co-evaluation             End of Session Equipment Utilized During Treatment: Gait belt;Left knee immobilizer Activity Tolerance: Patient limited by pain Patient left: in bed;with call bell/phone within reach;with bed alarm set;with SCD's reapplied (B heels elevated via towel roll and polar care in place)     Time: 1430-1455 PT Time Calculation (min) (ACUTE ONLY): 25 min  Charges:   $Therapeutic Activity: 23-37 mins                    G CodesLeitha Bleak 11-11-2015, 5:31 PM Leitha Bleak, [place]

## 2015-10-16 NOTE — Care Management Note (Signed)
Case Management Note  Patient Details  Name: Julie [name] MRN: [id number] Date of Birth: [birth year]  Subjective/Objective:                  Met with patient and her husband to discussed discharge planning. She has not wanted to work with PT this AM due to "not feeling good". She apparently depended on her husband at home to stand related to pain leg was causing prior to surgery. She would like to go to SNF and requests [facility]- I told patient she has to work with PT. She uses [facility] [address] [address]. For Rx. She has a rolling walker and a rollator available at home.   Action/Plan: List of home health agencies provided. RNCM will continue to follow. PT is currently recommending SNF.   Expected Discharge Date:                  Expected Discharge Plan:     In-House Referral:  Clinical Social Work  Discharge planning Services  CM Consult  Post Acute Care Choice:  Home Health Choice offered to:  Patient, Spouse  DME Arranged:    DME Agency:     HH Arranged:    HH Agency:     Status of Service:  In process, will continue to follow  Medicare Important Message Given:    Date Medicare IM Given:    Medicare IM give by:    Date Additional Medicare IM Given:    Additional Medicare Important Message give by:     If discussed at [place] of Stay Meetings, dates discussed:    Additional Comments:  Marshell Garfinkel, RN 10/16/2015, 11:56 AM

## 2015-10-16 NOTE — Clinical Social Work Note (Signed)
Clinical Social Work Assessment  Patient Details  Name: Julie [name] MRN: [id number] Date of Birth: 02/02/1932  Date of referral:  10/16/15               Reason for consult:  Facility Placement                Permission sought to share information with:  Facility Contact Representative Permission granted to share information::  Yes, Verbal Permission Granted  Name::      Skilled Nursing Facility   Agency::   [place]   Relationship::     Contact Information:     Housing/Transportation Living arrangements for the past 2 months:  Single Family Home Source of Information:  Patient, Spouse Patient Interpreter Needed:  None Criminal Activity/Legal Involvement Pertinent to Current Situation/Hospitalization:  No - Comment as needed Significant Relationships:  Spouse Lives with:  Spouse Do you feel safe going back to the place where you live?  Yes Need for family participation in patient care:  Yes (Comment)  Care giving concerns: Patient lives with her husband in [place].    Social Worker assessment / plan: Clinical Social Worker (CSW) met with patient and her husband Julie [name] was at bedside. CSW introduced self and explained role of CSW department. Patient was alert and oriented and sitting in the chair. Patient reported that she lives in [place] with her husband Julie [name]. Patient has 2 children and Julie [name] has 3 children. CSW explained that PT is recommending SNF. Patient and husband are agreeable to SNF search in [place] and prefer [facility].   FL2 complete and faxed out.   Employment status:  Retired Insurance information:  Managed Medicare PT Recommendations:  Skilled Nursing Facility Information / Referral to community resources:  Skilled Nursing Facility  Patient/Family's Response to care: Patient and husband are agreeable to SNF search.   Patient/Family's Understanding of and Emotional Response to Diagnosis, Current Treatment, and Prognosis: Patient was  pleasant throughout assessment.   Emotional Assessment Appearance:  Appears stated age Attitude/Demeanor/Rapport:    Affect (typically observed):  Accepting, Adaptable Orientation:  Oriented to Self, Oriented to Place, Oriented to  Time, Oriented to Situation Alcohol / Substance use:  Not Applicable Psych involvement (Current and /or in the community):  No (Comment)  Discharge Needs  Concerns to be addressed:  Discharge Planning Concerns Readmission within the last 30 days:  No Current discharge risk:  Dependent with Mobility Barriers to Discharge:  Continued Medical Work up   Morgan,  G, LCSW 10/16/2015, 2:34 PM  

## 2015-10-16 NOTE — Progress Notes (Signed)
POD 1. VSS. Pain controlled with meds per MAR. IS at the bedside and pt. Encouraged to use it. Autovac changed over to hemovac. Polarcare on and running. Foley patent and will be removed this AM. Neurochecks WDL.  Pt. Didn't sleep well at all last night. Daughter at the bedside. Will continue to monitor.

## 2015-10-16 NOTE — Evaluation (Signed)
Physical Therapy Evaluation Patient Details Name: Julie [name] MRN: [id number] DOB: 13-Dec-1931 Today's Date: 10/16/2015   History of Present Illness  This patient is an 81 year old female who came to [facility] for a L TKR performed on 10/15/2015.  Clinical Impression  Currently pt demonstrates impairments with L LE strength, L knee ROM, pain, and limitations with functional mobility.  Prior to admission, pt was ambulating independently with walker but required assist to stand d/t knee pain.  Pt lives with her husband in 1 level home with 4 steps to enter with B railing.  Currently pt requires mod assist supine to sit and 2 assist to stand with RW.  Pt able to take a few steps bed to recliner with RW but limited activity in morning d/t multiple emesis after LE ex's in bed (earlier in the morning) and then again after getting to chair later in morning.  Pt requiring L KI d/t unable to perform SLR independently on L LE.  Pt would benefit from skilled PT to address above noted impairments and functional limitations.  Recommend pt discharge to STR when medically appropriate.     Follow Up Recommendations SNF    Equipment Recommendations  Rolling walker with 5" wheels    Recommendations for Other Services       Precautions / Restrictions Precautions Precautions: Fall Required Braces or Orthoses: Knee Immobilizer - Left (until able to SLR independently x10) Knee Immobilizer - Left: On when out of bed or walking Restrictions Weight Bearing Restrictions: Yes LLE Weight Bearing: Weight bearing as tolerated      Mobility  Bed Mobility Overal bed mobility: Needs Assistance Bed Mobility: Supine to Sit     Supine to sit: Mod assist     General bed mobility comments: assist with trunk and L LE; vc's for technique required  Transfers Overall transfer level: Needs assistance Equipment used: Rolling walker (2 wheeled) Transfers: Sit to/from Stand Sit to Stand: Mod assist;+2 physical  assistance         General transfer comment: assist required to initiate stand and come to fully upright; vc's for hand and feet placement required  Ambulation/Gait Ambulation/Gait assistance: Min assist;+2 physical assistance Ambulation Distance (Feet): 3 Feet (bed to recliner) Assistive device: Rolling walker (2 wheeled) Gait Pattern/deviations: Step-to pattern Gait velocity: decreased   General Gait Details: vc's required for hand and feet placement/technique; antalgic  Stairs            Wheelchair Mobility    Modified Rankin (Stroke Patients Only)       Balance Overall balance assessment: Needs assistance Sitting-balance support: Bilateral upper extremity supported;Feet supported Sitting balance-Leahy Scale: Good     Standing balance support: Bilateral upper extremity supported (on RW) Standing balance-Leahy Scale: Good                               Pertinent Vitals/Pain Pain Assessment: 0-10 Pain Score: 7  Pain Location: L knee Pain Descriptors / Indicators: Constant;Sore;Tender Pain Intervention(s): Limited activity within patient's tolerance;Monitored during session;Premedicated before session;Repositioned;Ice applied    Home Living Family/patient expects to be discharged to:: Skilled nursing facility Living Arrangements: Spouse/significant other Available Help at Discharge: Family Type of Home: House Home Access: Stairs to enter Entrance Stairs-Rails: Right;Left;Can reach both Entrance Stairs-Number of Steps: 4 Home Layout: One level Home Equipment: Bradley - 4 wheels (transport chair)      Prior Function Level of Independence: Needs assistance  Gait / Transfers Assistance Needed: husband assists pt with standing d/t knee pain; uses RW           Hand Dominance        Extremity/Trunk Assessment   Upper Extremity Assessment: Overall WFL for tasks assessed           Lower Extremity Assessment: RLE deficits/detail;LLE  deficits/detail RLE Deficits / Details: R LE strength and ROM WFL LLE Deficits / Details: L hip flexion at least 3-/5; L knee flexion/extension at least 2+/5; L DF at least 3+/5     Communication   Communication: No difficulties  Cognition Arousal/Alertness: Awake/alert Behavior During Therapy: WFL for tasks assessed/performed Overall Cognitive Status: Within Functional Limits for tasks assessed                      General Comments General comments (skin integrity, edema, etc.): L LE dressing in place with hemovac  Nursing cleared pt for participation in physical therapy.  Pt agreeable to PT session.  1st attempt pt with multiple emesis after LE ex's in bed so session ended and PT came back later in AM for functional mobility (which ended with multiple emesis again); nursing notified of pt nausea/vomiting.    Exercises Total Joint Exercises Goniometric ROM: L knee extension 12 degrees short of neutral semi-supine in bed (tested in AM); L knee flexion 62 degrees in sitting (tested in PM)      Assessment/Plan    PT Assessment Patient needs continued PT services  PT Diagnosis Difficulty walking;Acute pain   PT Problem List Decreased strength;Decreased range of motion;Decreased activity tolerance;Decreased balance;Decreased mobility;Pain  PT Treatment Interventions DME instruction;Gait training;Stair training;Functional mobility training;Therapeutic activities;Therapeutic exercise;Balance training;Patient/family education;Manual techniques   PT Goals (Current goals can be found in the Care Plan section) Acute Rehab PT Goals Patient Stated Goal: to go to rehab PT Goal Formulation: With patient Time For Goal Achievement: 10-18-15 Potential to Achieve Goals: Good    Frequency BID   Barriers to discharge Decreased caregiver support      Co-evaluation               End of Session Equipment Utilized During Treatment: Gait belt;Left knee immobilizer Activity  Tolerance: Patient limited by pain (also limited d/t nausea/emesis) Patient left: in chair;with call bell/phone within reach;with chair alarm set;with SCD's reapplied (B heels elevated via towel rolls and polar care in place) Nurse Communication: Mobility status;Patient requests pain meds;Precautions (pt's emesis)         Time: 1110 (pt seen initially [phone] (session ended early d/t multiple emesis) and reattempted mobility later in AM)-1140 PT Time Calculation (min) (ACUTE ONLY): 30 min   Charges:   PT Evaluation $Initial PT Evaluation Tier I: 1 Procedure PT Treatments $Therapeutic Exercise: 8-22 mins   PT G CodesLeitha Bleak 10-18-15, 5:03 PM Leitha Bleak, [place]

## 2015-10-16 NOTE — Anesthesia Postprocedure Evaluation (Signed)
  Anesthesia Post-op Note  Patient: Julie [name]  Procedure(s) Performed: Procedure(s): COMPUTER ASSISTED TOTAL KNEE ARTHROPLASTY (Left)  Anesthesia type:Spinal  Patient location: 149  Post pain: Pain level controlled  Post assessment: Post-op Vital signs reviewed, Patient's Cardiovascular Status Stable, Respiratory Function Stable, Patent Airway and No signs of Nausea or vomiting  Post vital signs: Reviewed and stable  Last Vitals:  Filed Vitals:   10/16/15 0739  BP: 114/42  Pulse: 80  Temp: 37.7 C  Resp: 18    Level of consciousness: awake, alert  and patient cooperative  Complications: No apparent anesthesia complications

## 2015-10-16 NOTE — Progress Notes (Signed)
Clinical Education officer, museum (CSW) presented bed offers. Patient chose [facility]. CSW will continue to follow and assist as needed.   Blima Rich, [place] [phone]

## 2015-10-16 NOTE — NC FL2 (Signed)
[place] LEVEL OF CARE SCREENING TOOL     IDENTIFICATION  Patient Name: Julie [name] Birthdate: 12/07/1931 Sex: female Admission Date (Current Location): 10/15/2015  [place] and [place] Number:  [facility] )   Facility and Address:  [facility], [address] [place], [place] [address]      Provider Number: [id number] [phone])  Attending Physician Name and Address:  Dereck Leep, MD  Relative Name and Phone Number:       Current Level of Care: Hospital Recommended Level of Care: [place] Prior Approval Number:    Date Approved/Denied:   PASRR Number:  ([id number])  Discharge Plan: SNF    Current Diagnoses: Patient Active Problem List   Diagnosis Date Noted  . Total knee replacement status 10/15/2015  . B12 deficiency 07/16/2015  . Peripheral neuropathy ([facility]) 07/16/2015  . 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 Rib contusion 03/28/2015  . Abnormal urine odor 03/26/2015  . Laceration of head 03/26/2015  . Observation after surgery 02/13/2015  . Phalanx fracture, foot 01/10/2015  . Colon cancer screening 09/22/2014  . Osteoarthritis of left lower extremity 09/09/2014  . History of falling 04/26/2014  . Poor balance 04/26/2014  . Left-sided weakness 04/26/2014  . PMR (polymyalgia rheumatica) (HCC) 10/11/2013  . Encounter for Medicare annual wellness exam 07/13/2013  . Risk for falls 07/13/2013  . Skin tag of labia 03/14/2013  . Back pain, thoracic 08/07/2011  . Weakness of left leg 07/30/2011  . BACK PAIN, LUMBAR 12/10/2010  . BREAST MASS, RIGHT 09/13/2010  . Vitamin D deficiency 08/16/2010  . GERD 03/26/2010  . DYSPEPSIA 03/26/2010  . IRRITABLE BOWEL SYNDROME 03/26/2010  . Osteoporosis 08/27/2009  . Hyperlipidemia 07/24/2009  . DIVERTICULOSIS, COLON 07/24/2009  . OSTEOARTHRITIS 07/24/2009  . DIARRHEA, PERSISTENT 07/24/2009  . COLONIC POLYPS, HX OF 07/24/2009  . POSTMENOPAUSAL STATUS 07/24/2009     Orientation ACTIVITIES/SOCIAL BLADDER RESPIRATION    Self, Time, Situation, Place  Active Continent Normal  BEHAVIORAL SYMPTOMS/MOOD NEUROLOGICAL BOWEL NUTRITION STATUS   (none )  (none) Continent Diet (Regular Diet )  PHYSICIAN VISITS COMMUNICATION OF NEEDS Height & Weight Skin  30 days Verbally 5\' 3"  (160 cm) 182 lbs. Surgical wounds (Incision: Left Knee )          AMBULATORY STATUS RESPIRATION    Assist extensive Normal      Personal Care Assistance Level of Assistance  Bathing, Feeding, Dressing Bathing Assistance: Limited assistance Feeding assistance: Independent Dressing Assistance: Limited assistance      Functional Limitations Info  Sight, Hearing, Speech Sight Info: Adequate Hearing Info: Adequate Speech Info: Adequate       SPECIAL CARE FACTORS FREQUENCY  PT (By licensed PT), OT (By licensed OT)     PT Frequency:  (5) OT Frequency:  (5)           Additional Factors Info  Code Status, Allergies Code Status Info:  (Full Code. ) Allergies Info:  (Codeine )           Current Medications (10/16/2015): Current Facility-Administered Medications  Medication Dose Route Frequency Provider Last Rate Last Dose  . acetaminophen (OFIRMEV) IV 1,000 mg  1,000 mg Intravenous 4 times per day Dereck Leep, MD   1,000 mg at 10/16/15 0505  . acetaminophen (TYLENOL) tablet 650 mg  650 mg Oral Q6H PRN Dereck Leep, MD       Or  . acetaminophen (TYLENOL) suppository 650 mg  650 mg Rectal Q6H PRN Dereck Leep,  MD      . alum & mag hydroxide-simeth (MAALOX/MYLANTA) 200-200-20 MG/5ML suspension 30 mL  30 mL Oral PRN Dereck Leep, MD      . alum & mag hydroxide-simeth (MAALOX/MYLANTA) 200-200-20 MG/5ML suspension 30 mL  30 mL Oral Q4H PRN Dereck Leep, MD      . bisacodyl (DULCOLAX) suppository 10 mg  10 mg Rectal Daily PRN Dereck Leep, MD      . celecoxib (CELEBREX) capsule 200 mg  200 mg Oral Q12H Dereck Leep, MD   200 mg at 10/16/15 0808  .  cholecalciferol (VITAMIN D) tablet 4,000 Units  4,000 Units Oral Daily Dereck Leep, MD   4,000 Units at 10/16/15 [phone]  . diphenhydrAMINE (BENADRYL) 12.5 MG/5ML elixir 12.5-25 mg  12.5-25 mg Oral Q4H PRN Dereck Leep, MD      . enoxaparin (LOVENOX) injection 30 mg  30 mg Subcutaneous Q12H Dereck Leep, MD   30 mg at 10/16/15 0807  . fentaNYL (SUBLIMAZE) injection 25 mcg  25 mcg Intravenous Q5 min PRN Molli Barrows, MD   25 mcg at 10/15/15 1618  . ferrous sulfate tablet 325 mg  325 mg Oral BID WC Dereck Leep, MD   325 mg at 10/16/15 0808  . loperamide (IMODIUM) capsule 2 mg  2 mg Oral PRN Dereck Leep, MD      . magnesium hydroxide (MILK OF MAGNESIA) suspension 30 mL  30 mL Oral Daily PRN Dereck Leep, MD   30 mL at 10/16/15 0807  . menthol-cetylpyridinium (CEPACOL) lozenge 3 mg  1 lozenge Oral PRN Dereck Leep, MD       Or  . phenol (CHLORASEPTIC) mouth spray 1 spray  1 spray Mouth/Throat PRN Dereck Leep, MD      . metoCLOPramide (REGLAN) injection 10 mg  10 mg Intravenous Q8H PRN Dereck Leep, MD   10 mg at 10/16/15 1407  . metoCLOPramide (REGLAN) tablet 10 mg  10 mg Oral TID AC & HS Dereck Leep, MD   10 mg at 10/16/15 1107  . morphine 2 MG/ML injection 2-4 mg  2-4 mg Intravenous Q4H PRN Dereck Leep, MD   2 mg at 10/16/15 1407  . ondansetron (ZOFRAN) tablet 4 mg  4 mg Oral Q6H PRN Dereck Leep, MD       Or  . ondansetron (ZOFRAN) injection 4 mg  4 mg Intravenous Q6H PRN Dereck Leep, MD   4 mg at 10/16/15 0935  . oxyCODONE (Oxy IR/ROXICODONE) immediate release tablet 5-10 mg  5-10 mg Oral Q4H PRN Dereck Leep, MD   5 mg at 10/16/15 0813  . pantoprazole (PROTONIX) EC tablet 40 mg  40 mg Oral BID Dereck Leep, MD   40 mg at 10/16/15 [id number]  . promethazine (PHENERGAN) injection 12.5 mg  12.5 mg Intravenous Once Dereck Leep, MD      . senna-docusate (Senokot-S) tablet 1 tablet  1 tablet Oral BID Dereck Leep, MD   1 tablet at 10/16/15 0808  . sodium phosphate  (FLEET) 7-19 GM/118ML enema 1 enema  1 enema Rectal Once PRN Dereck Leep, MD      . traMADol Veatrice Bourbon) tablet 50-100 mg  50-100 mg Oral Q4H PRN Dereck Leep, MD      . vitamin B-12 (CYANOCOBALAMIN) tablet 1,000 mcg  1,000 mcg Oral Daily Dereck Leep, MD   1,000 mcg at 10/16/15 [phone]  Do not use this list as official medication orders. Please verify with discharge summary.  Discharge Medications:   Medication List    ASK your doctor about these medications        acetaminophen 500 MG tablet  Commonly known as:  TYLENOL  Take 500 mg by mouth every 6 (six) hours as needed.     BC FAST PAIN RELIEF 845-65 MG Pack  Generic drug:  Aspirin-Caffeine  Take 0.5 packets by mouth as needed (head ache).     cyanocobalamin 1000 MCG tablet  Take 1,000 mcg by mouth daily. In am     diphenhydramine-acetaminophen 25-500 MG Tabs tablet  Commonly known as:  TYLENOL PM  Take 1 tablet by mouth at bedtime as needed.     GAVISCON EXTRA STRENGTH 160-105 MG Chew  Generic drug:  Alum Hydroxide-Mag Carbonate  Chew 1 tablet by mouth as needed (acid reflux).     IRON PO  Take 65 mg by mouth daily. In am     loperamide 2 MG capsule  Commonly known as:  IMODIUM  Take by mouth as needed for diarrhea or loose stools.     Vitamin D 2000 UNITS Caps  Take 2 capsules by mouth daily. In am        Relevant Imaging Results:  Relevant Lab Results:  Recent Labs    Additional Information  (SSN: [id number])  Loralyn Freshwater, LCSW

## 2015-10-17 ENCOUNTER — Encounter
Admission: RE | Admit: 2015-10-17 | Discharge: 2015-10-17 | Disposition: A | Discharge status: Home / Self Care | Payer: Medicare Other | Source: Ambulatory Visit | Attending: Internal Medicine | Admitting: Internal Medicine

## 2015-10-17 LAB — CBC
HCT: 35 % (ref 35.0–47.0)
Hemoglobin: 11.7 g/dL — ABNORMAL LOW (ref 12.0–16.0)
MCH: 29.2 pg (ref 26.0–34.0)
MCHC: 33.5 g/dL (ref 32.0–36.0)
MCV: 87.2 fL (ref 80.0–100.0)
Platelets: 211 10*3/uL (ref 150–440)
RBC: 4.01 MIL/uL (ref 3.80–5.20)
RDW: 13.3 % (ref 11.5–14.5)
WBC: 8.5 10*3/uL (ref 3.6–11.0)

## 2015-10-17 LAB — URINALYSIS COMPLETE WITH MICROSCOPIC (ARMC ONLY)
Bacteria, UA: NONE SEEN
Bilirubin Urine: NEGATIVE
Glucose, UA: NEGATIVE mg/dL
Hgb urine dipstick: NEGATIVE
Leukocytes, UA: NEGATIVE
Nitrite: NEGATIVE
Protein, ur: NEGATIVE mg/dL
Specific Gravity, Urine: 1.011 (ref 1.005–1.030)
pH: 6 (ref 5.0–8.0)

## 2015-10-17 LAB — BASIC METABOLIC PANEL
Anion gap: 7 (ref 5–15)
BUN: 9 mg/dL (ref 6–20)
CO2: 26 mmol/L (ref 22–32)
Calcium: 8.3 mg/dL — ABNORMAL LOW (ref 8.9–10.3)
Chloride: 105 mmol/L (ref 101–111)
Creatinine, Ser: 0.8 mg/dL (ref 0.44–1.00)
GFR calc Af Amer: >60 mL/min (ref >60)
GFR calc non Af Amer: >60 mL/min (ref >60)
Glucose, Bld: 96 mg/dL (ref 65–99)
Potassium: 3.6 mmol/L (ref 3.5–5.1)
Sodium: 138 mmol/L (ref 135–145)

## 2015-10-17 NOTE — Progress Notes (Signed)
Plan is for patient to D/C to [facility] tomorrow. Kim admissions coordinator at [facility] is aware of above. Clinical Social Worker (CSW) will continue to follow and assist as needed.   Nyriah Coote Morgan, LCSWA [phone] 

## 2015-10-17 NOTE — Progress Notes (Signed)
Physical Therapy Treatment Patient Details Name: Julie [name] MRN: [id number] DOB: [birth year] Today's Date: 10/17/2015    History of Present Illness This patient is an 81 year old female who came to [facility] for a L TKR performed on 10/15/2015.    PT Comments    Pt progressing gradually with ambulation distance (30 feet with RW) with assist but still requiring L KI d/t quad weakness.  Pt still requiring significant assist to stand with RW and requiring cueing for technique.  Continue to recommend STR for discharge.   Follow Up Recommendations  SNF     Equipment Recommendations  Rolling walker with 5" wheels    Recommendations for Other Services       Precautions / Restrictions Precautions Precautions: Fall Required Braces or Orthoses: Knee Immobilizer - Left (until able to SLR x10 independently) Knee Immobilizer - Left: On when out of bed or walking Restrictions Weight Bearing Restrictions: Yes LLE Weight Bearing: Weight bearing as tolerated    Mobility  Bed Mobility                  Transfers Overall transfer level: Needs assistance (pt already sitting on edge of bed preparing to go to commode) Equipment used: Rolling walker (2 wheeled) Transfers: Sit to/from [facility] Sit to Stand: Max assist;+2 safety/equipment Stand pivot transfers: Max assist;+2 safety/equipment to commode       General transfer comment: assist required to initiate stand and come to fully upright; vc's for hand and feet placement required  Ambulation/Gait Ambulation/Gait assistance: Min assist Ambulation Distance (Feet): 30 Feet Assistive device: Rolling walker (2 wheeled) Gait Pattern/deviations: Step-to pattern Gait velocity: decreased   General Gait Details: vc's required for gait technique and walker use (vc's to stay closer to RW); antalgic   Stairs            Wheelchair Mobility    Modified Rankin (Stroke Patients Only)       Balance                                     Cognition Arousal/Alertness: Awake/alert Behavior During Therapy: WFL for tasks assessed/performed Overall Cognitive Status: Within Functional Limits for tasks assessed                      Exercises Total Joint Exercises Goniometric ROM: L knee extension 10 degrees short of neutral semi-supine in bed; L knee flexion 80 degrees in sitting  Performed sitting exercises x 10 reps B LE's:  Heel/toe raises (AROM R; AROM L); LAQ's (AROM R; AAROM L); marching/hip flexion (AROM R; AROM L).  Pt required vc's and tactile cues for correct technique with exercises.     General Comments General comments (skin integrity, edema, etc.): L LE dressing in place with some drainage noted  Nursing cleared pt for participation in physical therapy.  Pt agreeable to PT session.      Pertinent Vitals/Pain Pain Assessment: 0-10 Pain Score: 7  Pain Location: L knee Pain Descriptors / Indicators: Constant;Sore;Tender Pain Intervention(s): Limited activity within patient's tolerance;Monitored during session;Premedicated before session;Repositioned;Ice applied    Home Living                      Prior Function            PT Goals (current goals can now be found in the care plan section) Acute Rehab  PT Goals Patient Stated Goal: to go to rehab PT Goal Formulation: With patient Time For Goal Achievement: 10/16/15 Potential to Achieve Goals: Good Additional Goals Additional Goal #1: L knee ROM 0-90 degrees Progress towards PT goals: Progressing toward goals    Frequency  BID    PT Plan Current plan remains appropriate    Co-evaluation             End of Session Equipment Utilized During Treatment: Gait belt;Left knee immobilizer Activity Tolerance: Patient limited by pain;Patient limited by fatigue Patient left: in chair;with call bell/phone within reach;with chair alarm set;with SCD's reapplied (B heels elevated via towel rolls and  polar care in place)     Time: 1010-1039 PT Time Calculation (min) (ACUTE ONLY): 29 min  Charges:  $Gait Training: 8-22 mins $Therapeutic Activity: 8-22 mins                    G CodesLeitha Bleak 11-03-15, 4:47 PM Leitha Bleak, [place]

## 2015-10-17 NOTE — Care Management Important Message (Signed)
Important Message  Patient Details  Name: Julie [name] MRN: [id number] Date of Birth: 1932-09-15   Medicare Important Message Given:  Yes    Marshell Garfinkel, RN 10/17/2015, 8:32 AM

## 2015-10-17 NOTE — Progress Notes (Signed)
   Subjective: 2 Days Post-Op Procedure(s) (LRB): COMPUTER ASSISTED TOTAL KNEE ARTHROPLASTY (Left) Patient reports pain as moderate.   Patient is resting well. c/o increase urination but no other symptoms We will start therapy today.  Plan is to go Rehab after hospital stay. no nausea and no vomiting. Pt did have nausea yesterday that was controlled by changing of medication Patient denies any chest pains or shortness of breath. Objective: Vital signs in last 24 hours: Temp:  [98.2 F (36.8 C)-99.8 F (37.7 C)] 99.6 F (37.6 C) (11/16 0440) Pulse Rate:  [78-90] 83 (11/16 0440) Resp:  [18] 18 (11/16 0440) BP: (114-177)/(42-73) 177/70 mmHg (11/16 0440) SpO2:  [94 %-98 %] 94 % (11/16 0440) well approximated incision Heels are non tender and elevated off the bed using rolled towels Intake/Output from previous day: 11/15 0701 - 11/16 0700 In: 460 [P.O.:460] Out: 1175 [Urine:1155; Drains:20] Intake/Output this shift:     Recent Labs  10/16/15 0643 10/17/15 0642  HGB 11.1* 11.7*    Recent Labs  10/16/15 0643 10/17/15 0642  WBC 9.0 8.5  RBC 3.85 4.01  HCT 33.8* 35.0  PLT 212 211    Recent Labs  10/16/15 0643  NA 140  K 3.7  CL 109  CO2 25  BUN 13  CREATININE 0.96  GLUCOSE 89  CALCIUM 8.0*   No results for input(s): LABPT, INR in the last 72 hours.  EXAM General - Patient is Alert, Appropriate and Oriented Extremity - Neurologically intact Neurovascular intact Sensation intact distally Intact pulses distally Dorsiflexion/Plantar flexion intact Dressing - scant drainage Motor Function - intact, moving foot and toes well on exam.    Past Medical History  Diagnosis Date  . GERD (gastroesophageal reflux disease)   . Esophagitis   . Duodenitis   . Chronic leg pain   . Trochanteric bursitis   . OA (osteoarthritis) of knee   . TMJ syndrome   . Seborrheic dermatitis   . HLD (hyperlipidemia)   . Colon polyp   . Diverticulosis of colon    internal----Dr. Vira Agar  . Hemorrhoids   . Adenoma   . IBS (irritable bowel syndrome)     with PP diarrhea  . Scoliosis   . Osteopenia   . DDD (degenerative disc disease), cervical   . Neuropathy ([place])   . Unsteady gait     FALLS EASILY  . Neuropathy ([place])   . PONV (postoperative nausea and vomiting)   . Shortness of breath dyspnea     with activity    Assessment/Plan: 2 Days Post-Op Procedure(s) (LRB): COMPUTER ASSISTED TOTAL KNEE ARTHROPLASTY (Left) Active Problems:   Total knee replacement status  Estimated body mass index is 32.25 kg/(m^2) as calculated from the following:   Height as of this encounter: 5\' 3"  (1.6 m).   Weight as of this encounter: 82.555 kg (182 lb). Up with therapy Plan for discharge tomorrow to skill nursing  Labs: reviewed DVT Prophylaxis - Lovenox, Foot Pumps and TED hose Weight-Bearing as tolerated to left leg Dressing changed today U/A with C/S hemovac d/c'd  Jon R. [place] 10/17/2015, 7:31 AM

## 2015-10-17 NOTE — Progress Notes (Signed)
   Subjective: 2 Days Post-Op Procedure(s) (LRB): COMPUTER ASSISTED TOTAL KNEE ARTHROPLASTY (Left) Patient reports pain as 7 on 0-10 scale.   Patient is well, and has had no acute complaints or problems Continue with therapy today.  Plan is to go Home after hospital stay. no nausea and no vomiting Patient denies any chest pains or shortness of breath. Objective: Vital signs in last 24 hours: Temp:  [98.2 F (36.8 C)-99.6 F (37.6 C)] 99.6 F (37.6 C) (11/16 0440) Pulse Rate:  [78-90] 83 (11/16 0440) Resp:  [18] 18 (11/16 0440) BP: (156-177)/(65-73) 177/70 mmHg (11/16 0440) SpO2:  [94 %-98 %] 94 % (11/16 0440) well approximated incision Heels are non tender and elevated off the bed using rolled towels Intake/Output from previous day: 11/15 0701 - 11/16 0700 In: 460 [P.O.:460] Out: 1175 [Urine:1155; Drains:20] Intake/Output this shift:     Recent Labs  10/16/15 0643 10/17/15 0642  HGB 11.1* 11.7*    Recent Labs  10/16/15 0643 10/17/15 0642  WBC 9.0 8.5  RBC 3.85 4.01  HCT 33.8* 35.0  PLT 212 211    Recent Labs  10/16/15 0643 10/17/15 0642  NA 140 138  K 3.7 3.6  CL 109 105  CO2 25 26  BUN 13 9  CREATININE 0.96 0.80  GLUCOSE 89 96  CALCIUM 8.0* 8.3*   No results for input(s): LABPT, INR in the last 72 hours.  EXAM General - Patient is Alert, Appropriate and Oriented Extremity - Neurologically intact Neurovascular intact Sensation intact distally Intact pulses distally Dorsiflexion/Plantar flexion intact Dressing - dressing C/D/I Motor Function - intact, moving foot and toes well on exam.    Past Medical History  Diagnosis Date  . GERD (gastroesophageal reflux disease)   . Esophagitis   . Duodenitis   . Chronic leg pain   . Trochanteric bursitis   . OA (osteoarthritis) of knee   . TMJ syndrome   . Seborrheic dermatitis   . HLD (hyperlipidemia)   . Colon polyp   . Diverticulosis of colon     internal----Dr. Vira Agar  . Hemorrhoids   .  Adenoma   . IBS (irritable bowel syndrome)     with PP diarrhea  . Scoliosis   . Osteopenia   . DDD (degenerative disc disease), cervical   . Neuropathy ([place])   . Unsteady gait     FALLS EASILY  . Neuropathy ([place])   . PONV (postoperative nausea and vomiting)   . Shortness of breath dyspnea     with activity    Assessment/Plan: 2 Days Post-Op Procedure(s) (LRB): COMPUTER ASSISTED TOTAL KNEE ARTHROPLASTY (Left) Active Problems:   Total knee replacement status  Estimated body mass index is 32.25 kg/(m^2) as calculated from the following:   Height as of this encounter: 5\' 3"  (1.6 m).   Weight as of this encounter: 82.555 kg (182 lb). Up with therapy Plan for discharge tomorrow Discharge home with home health  Labs: reviewed DVT Prophylaxis - Lovenox, Foot Pumps and TED hose Weight-Bearing as tolerated to right leg Dressing changed hemovac d/c'd  Jillyn Ledger. [place] 10/17/2015, 7:48 AM

## 2015-10-17 NOTE — Progress Notes (Signed)
Occupational Therapy Treatment Patient Details Name: Julie [name] MRN: [id number] DOB: [birth year] Today's Date: 10/17/2015    History of present illness This patient is an 81 year old female who came to [facility] for a L TKR performed on 10/15/2015.   OT comments  Patient seen this date for BUE strengthening with mild resistance for shoulder flexion, elbow flexion/extension, diagonal patterns for 10 reps for 2 sets each.  Bed mobility with moderate assistance.  Educated on A/E and dressing techniques post surgery.  Patient was able to demo understanding but will need additional instruction to become proficient with use.  Continue OT during hospital stay and recommend f/u OT at SNF.  Follow Up Recommendations  SNF    Equipment Recommendations       Recommendations for Other Services      Precautions / Restrictions Precautions Precautions: Fall Required Braces or Orthoses: Knee Immobilizer - Left (until able to SLR x10 independently) Knee Immobilizer - Left: On when out of bed or walking Restrictions Weight Bearing Restrictions: Yes LLE Weight Bearing: Weight bearing as tolerated       Mobility Bed Mobility Overal bed mobility: Needs Assistance Bed Mobility: Sit to Supine     Supine to sit: Mod assist Sit to supine: Mod assist      Transfers      Balance                                   ADL                                                Vision                     Perception     Praxis      Cognition   Behavior During Therapy: WFL for tasks assessed/performed Overall Cognitive Status: Within Functional Limits for tasks assessed                       Extremity/Trunk Assessment               Exercises Total Joint Exercises Goniometric ROM: L knee extension 10 degrees short of neutral semi-supine in bed; L knee flexion 80 degrees in sitting   Shoulder Instructions       General Comments       Pertinent Vitals/ Pain       Pain Assessment: 0-10 Pain Score: 7  Pain Location: L knee Pain Descriptors / Indicators: Aching;Sore;Tender Pain Intervention(s): Limited activity within patient's tolerance;Monitored during session  Home Living                                          Prior Functioning/Environment              Frequency Min 1X/week     Progress Toward Goals  OT Goals(current goals can now be found in the care plan section)  Progress towards OT goals: Progressing toward goals  Acute Rehab OT Goals Patient Stated Goal: to go to rehab OT Goal Formulation: With patient Time For Goal Achievement: 10/30/15 Potential to Achieve Goals: Good  Plan  Co-evaluation                 End of Session     Activity Tolerance Patient tolerated treatment well   Patient Left in bed;with call bell/phone within reach;with bed alarm set;with family/visitor present   Nurse Communication          Time: [id number] OT Time Calculation (min): 18 min  Charges: OT General Charges $OT Visit: 1 Procedure OT Treatments $Therapeutic Exercise: 8-22 mins Amy T Lovett, OTR/L, CLT Lovett,Amy 10/17/2015, 4:56 PM

## 2015-10-17 NOTE — Progress Notes (Signed)
Physical Therapy Treatment Patient Details Name: JAICEE WALLMAN MRN: [id number] DOB: 29-Dec-1931 Today's Date: 10/17/2015    History of Present Illness This patient is an 81 year old female who came to [facility] for a L TKR performed on 10/15/2015.    PT Comments    Pt continues to gradually progress with ambulation distance (45 feet with RW this afternoon).  Pt limited with progress d/t L knee pain and fatigue with activity.  Limited session d/t pt requesting to toilet (for BM).  Plan for discharge to STR tomorrow.   Follow Up Recommendations  SNF     Equipment Recommendations  Rolling walker with 5" wheels    Recommendations for Other Services       Precautions / Restrictions Precautions Precautions: Fall Required Braces or Orthoses: Knee Immobilizer - Left (until able to SLR x10 independently) Knee Immobilizer - Left: On when out of bed or walking Restrictions Weight Bearing Restrictions: Yes LLE Weight Bearing: Weight bearing as tolerated    Mobility  Bed Mobility           Transfers Overall transfer level: Needs assistance Equipment used: Rolling walker (2 wheeled) Transfers: Sit to/from [employer] Transfers Sit to Stand: Max assist Stand pivot transfers: Min assist (to commode over toilet)       General transfer comment: assist required to initiate stand and come to fully upright; vc's for hand and feet placement required  Ambulation/Gait Ambulation/Gait assistance: Min guard;Min assist Ambulation Distance (Feet): 45 Feet Assistive device: Rolling walker (2 wheeled) Gait Pattern/deviations: Step-to pattern Gait velocity: decreased   General Gait Details: vc's required for gait technique and vc's to stay closer to RW; antalgic   Stairs            Wheelchair Mobility    Modified Rankin (Stroke Patients Only)       Balance                                    Cognition Arousal/Alertness: Awake/alert Behavior During  Therapy: WFL for tasks assessed/performed Overall Cognitive Status: Within Functional Limits for tasks assessed                      Exercises Total Joint Exercises Goniometric ROM: L knee extension 10 degrees short of neutral semi-supine in bed; L knee flexion 80 degrees in sitting (tested in AM session)    General Comments General comments (skin integrity, edema, etc.): L LE dressing in place with some drainage noted      Pertinent Vitals/Pain Pain Assessment: 0-10 Pain Score: 7  Pain Location: L knee Pain Descriptors / Indicators: Aching;Sore;Tender Pain Intervention(s): Limited activity within patient's tolerance;Monitored during session    Home Living                      Prior Function            PT Goals (current goals can now be found in the care plan section) Acute Rehab PT Goals Patient Stated Goal: to go to rehab PT Goal Formulation: With patient Time For Goal Achievement: 10/16/15 Potential to Achieve Goals: Good Additional Goals Additional Goal #1: L knee ROM 0-90 degrees Progress towards PT goals: Progressing toward goals    Frequency  BID    PT Plan Current plan remains appropriate    Co-evaluation  End of Session Equipment Utilized During Treatment: Gait belt;Left knee immobilizer Activity Tolerance: Patient limited by pain;Patient limited by fatigue Patient left:  (on commode (over toilet) with NA present)     Time: [id number] PT Time Calculation (min) (ACUTE ONLY): 23 min  Charges:  $Gait Training: 8-22 mins $Therapeutic Activity: 8-22 mins                    G CodesLeitha Bleak 11-09-15, 5:02 PM Leitha Bleak, [place]

## 2015-10-18 DIAGNOSIS — Z96652 Presence of left artificial knee joint: Secondary | ICD-10-CM | POA: Diagnosis not present

## 2015-10-18 DIAGNOSIS — E559 Vitamin D deficiency, unspecified: Secondary | ICD-10-CM | POA: Diagnosis not present

## 2015-10-18 DIAGNOSIS — E785 Hyperlipidemia, unspecified: Secondary | ICD-10-CM | POA: Diagnosis not present

## 2015-10-18 DIAGNOSIS — M159 Polyosteoarthritis, unspecified: Secondary | ICD-10-CM | POA: Diagnosis not present

## 2015-10-18 DIAGNOSIS — K5900 Constipation, unspecified: Secondary | ICD-10-CM | POA: Diagnosis not present

## 2015-10-18 DIAGNOSIS — K219 Gastro-esophageal reflux disease without esophagitis: Secondary | ICD-10-CM | POA: Diagnosis not present

## 2015-10-18 DIAGNOSIS — M810 Age-related osteoporosis without current pathological fracture: Secondary | ICD-10-CM | POA: Diagnosis not present

## 2015-10-18 DIAGNOSIS — M6281 Muscle weakness (generalized): Secondary | ICD-10-CM | POA: Diagnosis not present

## 2015-10-18 DIAGNOSIS — E538 Deficiency of other specified B group vitamins: Secondary | ICD-10-CM | POA: Diagnosis not present

## 2015-10-18 DIAGNOSIS — R2689 Other abnormalities of gait and mobility: Secondary | ICD-10-CM | POA: Diagnosis not present

## 2015-10-18 DIAGNOSIS — Z780 Asymptomatic menopausal state: Secondary | ICD-10-CM | POA: Diagnosis not present

## 2015-10-18 DIAGNOSIS — Z471 Aftercare following joint replacement surgery: Secondary | ICD-10-CM | POA: Diagnosis not present

## 2015-10-18 LAB — URINE CULTURE: Culture: NO GROWTH

## 2015-10-18 LAB — CBC
HCT: 33.3 % — ABNORMAL LOW (ref 35.0–47.0)
Hemoglobin: 11.2 g/dL — ABNORMAL LOW (ref 12.0–16.0)
MCH: 29.9 pg (ref 26.0–34.0)
MCHC: 33.6 g/dL (ref 32.0–36.0)
MCV: 89 fL (ref 80.0–100.0)
Platelets: 219 10*3/uL (ref 150–440)
RBC: 3.75 MIL/uL — ABNORMAL LOW (ref 3.80–5.20)
RDW: 13.4 % (ref 11.5–14.5)
WBC: 9.5 10*3/uL (ref 3.6–11.0)

## 2015-10-18 MED ORDER — OXYCODONE HCL 5 MG PO TABS: 5.0000 mg | ORAL_TABLET | ORAL | Status: DC | PRN

## 2015-10-18 MED ORDER — ENOXAPARIN SODIUM 30 MG/0.3ML ~~LOC~~ SOLN: 30.0000 mg | Freq: Two times a day (BID) | SUBCUTANEOUS | Status: DC

## 2015-10-18 MED ORDER — TRAMADOL HCL 50 MG PO TABS: 50.0000 mg | ORAL_TABLET | ORAL | Status: DC | PRN

## 2015-10-18 NOTE — Clinical Social Work Placement (Signed)
   CLINICAL SOCIAL WORK PLACEMENT  NOTE  Date:  10/18/2015  Patient Details  Name: Julie [name] MRN: [id number] Date of Birth: 1932/09/13  Clinical Social Work is seeking post-discharge placement for this patient at the [place] level of care (*CSW will initial, date and re-position this form in  chart as items are completed):  Yes   Patient/family provided with [place] Work Department's list of facilities offering this level of care within the geographic area requested by the patient (or if unable, by the patient's family).  Yes   Patient/family informed of their freedom to choose among providers that offer the needed level of care, that participate in Medicare, Medicaid or managed care program needed by the patient, have an available bed and are willing to accept the patient.  Yes   Patient/family informed of [facility]'s ownership interest in [facility] and [facility], as well as of the fact that they are under no obligation to receive care at these facilities.  PASRR submitted to EDS on 10/16/15     PASRR number received on 10/16/15     Existing PASRR number confirmed on       FL2 transmitted to all facilities in geographic area requested by pt/family on 10/16/15     FL2 transmitted to all facilities within larger geographic area on       Patient informed that his/her managed care company has contracts with or will negotiate with certain facilities, including the following:        Yes   Patient/family informed of bed offers received.  Patient chooses bed at [facility]     Physician recommends and patient chooses bed at      Patient to be transferred to [facility] on 10/18/15.  Patient to be transferred to facility by EMS     Patient family notified on 10/18/15 of transfer.  Name of family member notified:      Husband at bedside  PHYSICIAN Please sign FL2     Additional Comment:     _______________________________________________ Alonna Buckler, LCSW 10/18/2015, 9:26 AM

## 2015-10-18 NOTE — Progress Notes (Signed)
Nursing assistant Juventino Slovak went to put pt. On BSC and she refused and insisted to get on the bedpan. She said her husband would just put her on the bedpan and we explained to her that we have no bedpan anymore and she has to get up to the [facility] to void. Pt. Finally got up with lots of hesitation to use BSC.

## 2015-10-18 NOTE — Discharge Summary (Signed)
Physician Discharge Summary  Patient ID: Julie [name] MRN: [id number] DOB/AGE: 1932/01/14 81 y.o.  Admit date: 10/15/2015 Discharge date: 10/18/2015  Admission Diagnoses:  DEGENERATIVE OSTEOARTHRITIS   Discharge Diagnoses: Patient Active Problem List   Diagnosis Date Noted  . Total knee replacement status 10/15/2015  . B12 deficiency 07/16/2015  . Peripheral neuropathy ([place]) 07/16/2015  . 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 Rib contusion 03/28/2015  . Abnormal urine odor 03/26/2015  . Laceration of head 03/26/2015  . Observation after surgery 02/13/2015  . Phalanx fracture, foot 01/10/2015  . Colon cancer screening 09/22/2014  . Osteoarthritis of left lower extremity 09/09/2014  . History of falling 04/26/2014  . Poor balance 04/26/2014  . Left-sided weakness 04/26/2014  . PMR (polymyalgia rheumatica) (HCC) 10/11/2013  . Encounter for Medicare annual wellness exam 07/13/2013  . Risk for falls 07/13/2013  . Skin tag of labia 03/14/2013  . Back pain, thoracic 08/07/2011  . Weakness of left leg 07/30/2011  . BACK PAIN, LUMBAR 12/10/2010  . BREAST MASS, RIGHT 09/13/2010  . Vitamin D deficiency 08/16/2010  . GERD 03/26/2010  . DYSPEPSIA 03/26/2010  . IRRITABLE BOWEL SYNDROME 03/26/2010  . Osteoporosis 08/27/2009  . Hyperlipidemia 07/24/2009  . DIVERTICULOSIS, COLON 07/24/2009  . OSTEOARTHRITIS 07/24/2009  . DIARRHEA, PERSISTENT 07/24/2009  . COLONIC POLYPS, HX OF 07/24/2009  . POSTMENOPAUSAL STATUS 07/24/2009    Past Medical History  Diagnosis Date  . GERD (gastroesophageal reflux disease)   . Esophagitis   . Duodenitis   . Chronic leg pain   . Trochanteric bursitis   . OA (osteoarthritis) of knee   . TMJ syndrome   . Seborrheic dermatitis   . HLD (hyperlipidemia)   . Colon polyp   . Diverticulosis of colon     internal----Dr. Vira Agar  . Hemorrhoids   . Adenoma   . IBS (irritable bowel syndrome)     with PP diarrhea  . Scoliosis   . Osteopenia   . DDD  (degenerative disc disease), cervical   . Neuropathy ([place])   . Unsteady gait     FALLS EASILY  . Neuropathy ([place])   . PONV (postoperative nausea and vomiting)   . Shortness of breath dyspnea     with activity     Transfusion: No transfusions given during this admission   Consultants (if any):   case management for assistance with placement  Discharged Condition: Improved  Hospital Course: Julie [name] is an 81 y.o. female who was admitted 10/15/2015 with a diagnosis of degenerative arthrosis left knee and went to the operating room on 10/15/2015 and underwent the above named procedures.    Surgeries:Procedure(s): COMPUTER ASSISTED TOTAL KNEE ARTHROPLASTY on 10/15/2015  PRE-OPERATIVE DIAGNOSIS: Degenerative arthrosis of the left knee, primary  POST-OPERATIVE DIAGNOSIS: Same  PROCEDURE: Left total knee arthroplasty using computer-assisted navigation  SURGEON: Marciano Sequin. M.D.  ASSISTANT: Vance Peper, PA (present and scrubbed throughout the case, critical for assistance with exposure, retraction, instrumentation, and closure)  ANESTHESIA: spinal  ESTIMATED BLOOD LOSS: 50 mL  FLUIDS REPLACED: 1500 mL of crystalloid  TOURNIQUET TIME: 70 minutes  DRAINS: 2 medium drains to a reinfusion system  SOFT TISSUE RELEASES: Anterior cruciate ligament, posterior cruciate ligament, deep medial collateral ligament, patellofemoral ligament   IMPLANTS UTILIZED: DePuy PFC Sigma size 2.5 posterior stabilized femoral component (cemented), size 2 MBT tibial component (cemented), 35 mm 3 peg oval dome patella (cemented), and a 10 mm stabilized rotating platform polyethylene insert.  INDICATIONS FOR SURGERY: Julie [name] is a 81  y.o. year old female with a long history of progressive knee pain. X-rays demonstrated severe degenerative changes in tricompartmental fashion. The patient had not seen any significant improvement despite conservative nonsurgical intervention. After  discussion of the risks and benefits of surgical intervention, the patient expressed understanding of the risks benefits and agree with plans for total knee arthroplasty.   The risks, benefits, and alternatives were discussed at length including but not limited to the risks of infection, bleeding, nerve injury, stiffness, blood clots, the need for revision surgery, cardiopulmonary complications, among others, and they were willing to proceed. Patient tolerated the surgery well. No complications .Patient was taken to PACU where she was stabilized and then transferred to the orthopedic floor.  Patient started on Lovenox 30 q 12 hrs. Foot pumps applied bilaterally at 80 mm hg . Heels elevated off bed with rolled towels. No evidence of DVT. Calves non tender. Negative Homan. Physical therapy started on day #1 for gait training and transfer with OT starting on  day #1 for ADL and assisted devices. Patient has done well with therapy. Ambulated 45 feet upon being discharged.  Patient's IV and Foley was discontinued on day 1 with the Hemovac being discontinued on day #2   She was given perioperative antibiotics:  Anti-infectives    Start     Dose/Rate Route Frequency Ordered Stop   10/15/15 1630  ceFAZolin (ANCEF) IVPB 2 g/50 mL premix     2 g 100 mL/hr over 30 Minutes Intravenous Every 6 hours 10/15/15 1628 10/16/15 0837   10/15/15 0130  ceFAZolin (ANCEF) IVPB 2 g/50 mL premix     2 g 100 mL/hr over 30 Minutes Intravenous  Once 10/15/15 0127 10/15/15 1158    .  She was fitted with AV 1 compression foot pumps devices bilaterally, early ambulation, and TED stockings for DVT prophylaxis.  She benefited maximally from the hospital stay and there were no complications.    Recent vital signs:  Filed Vitals:   10/18/15 0535  BP: 158/63  Pulse: 88  Temp: 98 F (36.7 C)  Resp: 18    Recent laboratory studies:  Lab Results  Component Value Date   HGB 11.2* 10/18/2015   HGB 11.7* 10/17/2015    HGB 11.1* 10/16/2015   Lab Results  Component Value Date   WBC 9.5 10/18/2015   PLT PENDING 10/18/2015   Lab Results  Component Value Date   INR 0.98 10/03/2015   Lab Results  Component Value Date   NA 138 10/17/2015   K 3.6 10/17/2015   CL 105 10/17/2015   CO2 26 10/17/2015   BUN 9 10/17/2015   CREATININE 0.80 10/17/2015   GLUCOSE 96 10/17/2015    Discharge Medications:     Medication List    TAKE these medications        acetaminophen 500 MG tablet  Commonly known as:  TYLENOL  Take 500 mg by mouth every 6 (six) hours as needed.     BC FAST PAIN RELIEF 845-65 MG Pack  Generic drug:  Aspirin-Caffeine  Take 0.5 packets by mouth as needed (head ache).     cyanocobalamin 1000 MCG tablet  Take 1,000 mcg by mouth daily. In am     diphenhydramine-acetaminophen 25-500 MG Tabs tablet  Commonly known as:  TYLENOL PM  Take 1 tablet by mouth at bedtime as needed.     enoxaparin 30 MG/0.3ML injection  Commonly known as:  LOVENOX  Inject 0.3 mLs (30 mg total) into the skin  every 12 (twelve) hours.     GAVISCON EXTRA STRENGTH 160-105 MG Chew  Generic drug:  Alum Hydroxide-Mag Carbonate  Chew 1 tablet by mouth as needed (acid reflux).     IRON PO  Take 65 mg by mouth daily. In am     loperamide 2 MG capsule  Commonly known as:  IMODIUM  Take by mouth as needed for diarrhea or loose stools.     oxyCODONE 5 MG immediate release tablet  Commonly known as:  Oxy IR/ROXICODONE  Take 1-2 tablets (5-10 mg total) by mouth every 4 (four) hours as needed for severe pain or breakthrough pain.     traMADol 50 MG tablet  Commonly known as:  ULTRAM  Take 1-2 tablets (50-100 mg total) by mouth every 4 (four) hours as needed for moderate pain.     Vitamin D 2000 UNITS Caps  Take 2 capsules by mouth daily. In am        Diagnostic Studies: Dg Knee Left Port  10/15/2015  CLINICAL DATA:  Post op knee -left EXAM: PORTABLE LEFT KNEE - 1-2 VIEW COMPARISON:  09/29/2014 FINDINGS:  Left knee arthroplasty. No acute hardware complication. Surgical drain, gas, and staples project about the anterior knee. IMPRESSION: Expected appearance after left knee arthroplasty. Electronically Signed   By: Abigail Miyamoto M.D.   On: 10/15/2015 15:33    Disposition: 01-Home or Self Care      Discharge Instructions    Diet - low sodium heart healthy    Complete by:  As directed      Increase activity slowly    Complete by:  As directed            Follow-up Information    Follow up with [facility] On 10/30/2015.   Specialty:  Physician Assistant   Why:  at 9:45am   Contact information:   [address] [facility] [place] [address] [phone]       Follow up with Dereck Leep, MD On 11/27/2015.   Specialty:  Orthopedic Surgery   Why:  at 11:15am      Follow up with [facility] .   Specialty:  [place] information:   [address] [place] [phone]       Signed: Watt Climes. 10/18/2015, 8:10 AM

## 2015-10-18 NOTE — Progress Notes (Signed)
Pt. Was told at beginning of shift to call for [facility] when she needed to void. Aide just went in and rounded on pt. Husband has put pt. on bedpan again. Pt. Was told again to call and tell us when she has to void b/c she needs to get up to [facility]. Will continue to monitor.

## 2015-10-18 NOTE — Progress Notes (Signed)
Physical Therapy Treatment Patient Details Name: STARLEE ENSMINGER MRN: [id number] DOB: November 01, 1932 Today's Date: 10/18/2015    History of Present Illness This patient is an 81 year old female who came to [facility] for a L TKR performed on 10/15/2015.    PT Comments    Pt continues to gradually progress with bed mobility, transfers, and ambulation (50 feet with RW) but ambulation distance limited d/t fatigue and pt c/o dizziness (vitals including BP WFL).  Pt unable to perform L LE SLR independently so L KI utilized for functional mobility d/t L quad weakness.  D/t pt's current assist levels and impaired activity tolerance, continue to recommend pt discharge to STR (plan for discharge to STR today).   Follow Up Recommendations  SNF     Equipment Recommendations  Rolling walker with 5" wheels    Recommendations for Other Services       Precautions / Restrictions Precautions Precautions: Fall Required Braces or Orthoses: Knee Immobilizer - Left (until able to SLR x10 independently) Knee Immobilizer - Left: On when out of bed or walking Restrictions Weight Bearing Restrictions: Yes LLE Weight Bearing: Weight bearing as tolerated    Mobility  Bed Mobility Overal bed mobility: Needs Assistance Bed Mobility: Supine to Sit     Supine to sit: Min assist     General bed mobility comments: assist for L LE; use of side rail; increased time to perform; vc's for technique required  Transfers Overall transfer level: Needs assistance Equipment used: Rolling walker (2 wheeled) Transfers: Sit to/from Stand Sit to Stand: Max assist         General transfer comment: assist required to initiate stand and come to fully upright; vc's for hand and feet placement required  Ambulation/Gait Ambulation/Gait assistance: Min guard Ambulation Distance (Feet): 50 Feet Assistive device: Rolling walker (2 wheeled) Gait Pattern/deviations: Step-to pattern Gait velocity: decreased   General Gait  Details: vc's required for gait technique and vc's to stay closer to RW; antalgic; limited distance d/t pt reporting dizziness and fatigue (chair brought to pt and pt brought back to room in recliner chair)   Stairs            Wheelchair Mobility    Modified Rankin (Stroke Patients Only)       Balance                                    Cognition Arousal/Alertness: Awake/alert Behavior During Therapy: WFL for tasks assessed/performed Overall Cognitive Status: Within Functional Limits for tasks assessed                      Exercises Total Joint Exercises Goniometric ROM: L knee extension 8 degrees short of neutral semi-supine in bed; L knee flexion 91 degrees in sitting  Performed semi-supine B LE therapeutic exercise x 10 reps:  Ankle pumps (AROM B LE's); quad sets x3 second holds (AROM B LE's); SAQ's (AROM R; AAROM L); heelslides (AROM R; AAROM L), hip abd/adduction (AROM R; AAROM L), and SLR (AAROM R; AAROM L).  Pt required vc's and tactile cues for correct technique with exercises.     General Comments   Nursing cleared pt for participation in physical therapy.  Pt agreeable to PT session.      Pertinent Vitals/Pain Pain Assessment: 0-10 Pain Score: 6  Pain Location: L knee Pain Descriptors / Indicators: Aching;Sore;Tender Pain Intervention(s): Limited activity within patient's  tolerance;Monitored during session;Premedicated before session;Repositioned;Ice applied  See flowsheet for vitals details.    Home Living                      Prior Function            PT Goals (current goals can now be found in the care plan section) Acute Rehab PT Goals Patient Stated Goal: to go to rehab PT Goal Formulation: With patient Time For Goal Achievement: 10/16/15 Potential to Achieve Goals: Good Additional Goals Additional Goal #1: L knee ROM 0-90 degrees Progress towards PT goals: Progressing toward goals    Frequency  BID    PT  Plan Current plan remains appropriate    Co-evaluation             End of Session Equipment Utilized During Treatment: Gait belt;Left knee immobilizer Activity Tolerance: Patient limited by pain;Patient limited by fatigue Patient left: in chair;with call bell/phone within reach;with chair alarm set;with SCD's reapplied (B heels elevated via towel rolls; polar care in place)     Time: [id number] PT Time Calculation (min) (ACUTE ONLY): 44 min  Charges:  $Gait Training: 8-22 mins $Therapeutic Exercise: 8-22 mins $Therapeutic Activity: 8-22 mins                    G CodesLeitha Bleak 10-23-2015, 11:15 AM Leitha Bleak, [place]

## 2015-10-18 NOTE — Progress Notes (Signed)
Pt. Alert and oriented. VSS. Pain controlled with PO pain meds. Pt. Up to [facility] with moderate assist. Pt. Very hesitant to get on BSC. Lots of encouragement taken to get her there. IS at the bedside and pt. Encouraged to use it. Polarcare on and running. Neurochecks WDL. Resting quietly with husband at the bedside.

## 2015-10-18 NOTE — Progress Notes (Signed)
CSW was notified that Pt has been medically cleared for dc to SNF [facility]. CSW sent DC summary, instructions, and transfer packet to SNF. Room and report # given to RN. Pt's husband at hospital and aware of dc to SNF today per RN.  Toma Copier, [place]

## 2015-10-18 NOTE — Discharge Instructions (Signed)

## 2015-10-18 NOTE — Progress Notes (Signed)
Pt. Called out needing to void. RN Delana Meyer went in to put pt. On BSC. Pt. Stated she was unable to get to [facility] and wanted bedpan. Jasmine encouraged pt. To get up to [facility] and discarded bedpan. Pt. Up to [facility] with moderate assist.

## 2015-10-18 NOTE — Progress Notes (Signed)
Subjective: 3 Days Post-Op Procedure(s) (LRB): COMPUTER ASSISTED TOTAL KNEE ARTHROPLASTY (Left) Patient reports pain as moderate.   Patient is well, and has had no acute complaints or problems Continue with physical therapy today.  Plan is to go Rehab after hospital stay. no nausea or vomiting Patient still having increased frequency in urination but no other symptoms. Urinalysis done yesterday did not reveal any UTI. Having a hard time encouraging patient to use a bedside commode versus bedpan. Explained to her the rationale behind this. Patient denies any chest pains or shortness of breath. Objective: Vital signs in last 24 hours: Temp:  [97.5 F (36.4 C)-98.8 F (37.1 C)] 98 F (36.7 C) (11/17 0535) Pulse Rate:  [83-93] 88 (11/17 0535) Resp:  [17-18] 18 (11/17 0535) BP: (151-158)/(55-67) 158/63 mmHg (11/17 0535) SpO2:  [96 %-99 %] 96 % (11/17 0535) well approximated incision Heels are non tender and elevated off the bed using rolled towels Intake/Output from previous day: 11/16 0701 - 11/17 0700 In: 820 [P.O.:820] Out: 100 [Urine:100] Intake/Output this shift:     Recent Labs  10/16/15 0643 10/17/15 0642 10/18/15 0556  HGB 11.1* 11.7* 11.2*    Recent Labs  10/17/15 0642 10/18/15 0556  WBC 8.5 9.5  RBC 4.01 3.75*  HCT 35.0 33.3*  PLT 211 PENDING    Recent Labs  10/16/15 0643 10/17/15 0642  NA 140 138  K 3.7 3.6  CL 109 105  CO2 25 26  BUN 13 9  CREATININE 0.96 0.80  GLUCOSE 89 96  CALCIUM 8.0* 8.3*   No results for input(s): LABPT, INR in the last 72 hours.  EXAM General - Patient is Alert, Appropriate and Oriented Extremity - Neurologically intact ABD soft Neurovascular intact Sensation intact distally Intact pulses distally Dorsiflexion/Plantar flexion intact Dressing - dressing C/D/I Motor Function - intact, moving foot and toes well on exam.   Bone foam to left foot and ankle appears to be torn well in regards to keeping the leg in a  anatomical neutral position  Past Medical History  Diagnosis Date  . GERD (gastroesophageal reflux disease)   . Esophagitis   . Duodenitis   . Chronic leg pain   . Trochanteric bursitis   . OA (osteoarthritis) of knee   . TMJ syndrome   . Seborrheic dermatitis   . HLD (hyperlipidemia)   . Colon polyp   . Diverticulosis of colon     internal----Dr. Vira Agar  . Hemorrhoids   . Adenoma   . IBS (irritable bowel syndrome)     with PP diarrhea  . Scoliosis   . Osteopenia   . DDD (degenerative disc disease), cervical   . Neuropathy ([place])   . Unsteady gait     FALLS EASILY  . Neuropathy ([place])   . PONV (postoperative nausea and vomiting)   . Shortness of breath dyspnea     with activity    Assessment/Plan: 3 Days Post-Op Procedure(s) (LRB): COMPUTER ASSISTED TOTAL KNEE ARTHROPLASTY (Left) Active Problems:   Total knee replacement status  Estimated body mass index is 32.25 kg/(m^2) as calculated from the following:   Height as of this encounter: 5\' 3"  (1.6 m).   Weight as of this encounter: 82.555 kg (182 lb). Up with therapy Discharge to SNF today  Labs: Reviewed DVT Prophylaxis - Lovenox, Foot Pumps and TED hose Weight-Bearing as tolerated to left leg Please change dressing to left leg prior to being discharged  Alyson Ki R. [place] 10/18/2015, 7:51 AM

## 2015-10-19 DIAGNOSIS — K5900 Constipation, unspecified: Secondary | ICD-10-CM | POA: Diagnosis not present

## 2015-10-19 DIAGNOSIS — M159 Polyosteoarthritis, unspecified: Secondary | ICD-10-CM | POA: Diagnosis not present

## 2015-11-02 DIAGNOSIS — Z96652 Presence of left artificial knee joint: Secondary | ICD-10-CM | POA: Diagnosis not present

## 2015-11-05 DIAGNOSIS — Z96652 Presence of left artificial knee joint: Secondary | ICD-10-CM | POA: Diagnosis not present

## 2015-11-07 DIAGNOSIS — Z96652 Presence of left artificial knee joint: Secondary | ICD-10-CM | POA: Diagnosis not present

## 2015-11-09 DIAGNOSIS — Z96652 Presence of left artificial knee joint: Secondary | ICD-10-CM | POA: Diagnosis not present

## 2015-11-12 DIAGNOSIS — Z96652 Presence of left artificial knee joint: Secondary | ICD-10-CM | POA: Diagnosis not present

## 2015-11-14 DIAGNOSIS — Z96652 Presence of left artificial knee joint: Secondary | ICD-10-CM | POA: Diagnosis not present

## 2015-11-16 DIAGNOSIS — Z96652 Presence of left artificial knee joint: Secondary | ICD-10-CM | POA: Diagnosis not present

## 2015-11-19 DIAGNOSIS — Z96652 Presence of left artificial knee joint: Secondary | ICD-10-CM | POA: Diagnosis not present

## 2015-11-21 DIAGNOSIS — Z96652 Presence of left artificial knee joint: Secondary | ICD-10-CM | POA: Diagnosis not present

## 2015-11-27 DIAGNOSIS — Z96652 Presence of left artificial knee joint: Secondary | ICD-10-CM | POA: Diagnosis not present

## 2015-11-28 DIAGNOSIS — Z96652 Presence of left artificial knee joint: Secondary | ICD-10-CM | POA: Diagnosis not present

## 2015-11-30 DIAGNOSIS — Z96652 Presence of left artificial knee joint: Secondary | ICD-10-CM | POA: Diagnosis not present

## 2015-12-07 DIAGNOSIS — Z96652 Presence of left artificial knee joint: Secondary | ICD-10-CM | POA: Diagnosis not present

## 2015-12-13 DIAGNOSIS — Z96652 Presence of left artificial knee joint: Secondary | ICD-10-CM | POA: Diagnosis not present

## 2015-12-19 DIAGNOSIS — Z96652 Presence of left artificial knee joint: Secondary | ICD-10-CM | POA: Diagnosis not present

## 2015-12-21 DIAGNOSIS — Z96652 Presence of left artificial knee joint: Secondary | ICD-10-CM | POA: Diagnosis not present

## 2015-12-24 DIAGNOSIS — Z96652 Presence of left artificial knee joint: Secondary | ICD-10-CM | POA: Diagnosis not present

## 2015-12-26 DIAGNOSIS — G8929 Other chronic pain: Secondary | ICD-10-CM | POA: Diagnosis not present

## 2015-12-26 DIAGNOSIS — Z9889 Other specified postprocedural states: Secondary | ICD-10-CM | POA: Diagnosis not present

## 2015-12-26 DIAGNOSIS — M25562 Pain in left knee: Secondary | ICD-10-CM | POA: Diagnosis not present

## 2015-12-31 DIAGNOSIS — Z96652 Presence of left artificial knee joint: Secondary | ICD-10-CM | POA: Diagnosis not present

## 2016-01-02 DIAGNOSIS — Z96652 Presence of left artificial knee joint: Secondary | ICD-10-CM | POA: Diagnosis not present

## 2016-01-08 DIAGNOSIS — Z96652 Presence of left artificial knee joint: Secondary | ICD-10-CM | POA: Diagnosis not present

## 2016-02-01 ENCOUNTER — Encounter: Payer: Self-pay | Admitting: Family Medicine

## 2016-02-01 ENCOUNTER — Ambulatory Visit (INDEPENDENT_AMBULATORY_CARE_PROVIDER_SITE_OTHER): Payer: Medicare Other | Admitting: Family Medicine

## 2016-02-01 ENCOUNTER — Ambulatory Visit: Payer: Self-pay | Admitting: Family Medicine

## 2016-02-01 VITALS — BP 122/68 | HR 67 | Temp 98.3°F | Ht 61.0 in | Wt 180.0 lb

## 2016-02-01 DIAGNOSIS — M25562 Pain in left knee: Secondary | ICD-10-CM | POA: Insufficient documentation

## 2016-02-01 DIAGNOSIS — G6289 Other specified polyneuropathies: Secondary | ICD-10-CM | POA: Diagnosis not present

## 2016-02-01 MED ORDER — GABAPENTIN 100 MG PO CAPS: ORAL_CAPSULE | ORAL | Status: DC

## 2016-02-01 NOTE — Progress Notes (Signed)
Pre visit review using our clinic review tool, if applicable. No additional management support is needed unless otherwise documented below in the visit note. 

## 2016-02-01 NOTE — Progress Notes (Signed)
Subjective:    Patient ID: Julie [name], female    DOB: Jul 10, 1932, 82 y.o.   MRN: [id number]  HPI  Her with L leg pain  Had a knee replacement in the fall -- Dr Marry Guan A lot of pain with it  During the night- she sleeps with it on a pillow and it jerks - happens 2-3 times  Nothing went wrong with healing or surgery itself Still walks wit walker for stability     He thinks it is the neuropathy making it hurt more   Still has chorea in there right leg - - just happens when awake   L knee aches Hurts to move and get up  Going to PT Really hurts during PT Was taking tramadol - off of it now  No longer on oxycodone  Takes tylenol - usually takes 2 at night  Leg keeps her up all night   Dr Marry Guan thinks the pain is not from the knee surgery   Has periph neuropathy  Past  -took tramadol Never been on gabapentin   Neuropathy generally bothers her from calf down  Crawling tingling sensation    Patient Active Problem List   Diagnosis Date Noted  . Left knee pain 02/01/2016  . Total knee replacement status 10/15/2015  . B12 deficiency 07/16/2015  . Peripheral neuropathy ([place]) 07/16/2015  . 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 Rib contusion 03/28/2015  . Abnormal urine odor 03/26/2015  . Laceration of head 03/26/2015  . Observation after surgery 02/13/2015  . Phalanx fracture, foot 01/10/2015  . Colon cancer screening 09/22/2014  . Osteoarthritis of left lower extremity 09/09/2014  . History of falling 04/26/2014  . Poor balance 04/26/2014  . Left-sided weakness 04/26/2014  . PMR (polymyalgia rheumatica) ([facility]) 10/11/2013  . Encounter for Medicare annual wellness exam 07/13/2013  . Risk for falls 07/13/2013  . Skin tag of labia 03/14/2013  . Back pain, thoracic 08/07/2011  . Weakness of left leg 07/30/2011  . BACK PAIN, LUMBAR 12/10/2010  . BREAST MASS, RIGHT 09/13/2010  . Vitamin D deficiency 08/16/2010  . GERD 03/26/2010  . DYSPEPSIA 03/26/2010  . IRRITABLE BOWEL  SYNDROME 03/26/2010  . Osteoporosis 08/27/2009  . Hyperlipidemia 07/24/2009  . DIVERTICULOSIS, COLON 07/24/2009  . OSTEOARTHRITIS 07/24/2009  . DIARRHEA, PERSISTENT 07/24/2009  . COLONIC POLYPS, HX OF 07/24/2009  . POSTMENOPAUSAL STATUS 07/24/2009   Past Medical History  Diagnosis Date  . GERD (gastroesophageal reflux disease)   . Esophagitis   . Duodenitis   . Chronic leg pain   . Trochanteric bursitis   . OA (osteoarthritis) of knee   . TMJ syndrome   . Seborrheic dermatitis   . HLD (hyperlipidemia)   . Colon polyp   . Diverticulosis of colon     internal----Dr. Vira Agar  . Hemorrhoids   . Adenoma   . IBS (irritable bowel syndrome)     with PP diarrhea  . Scoliosis   . Osteopenia   . DDD (degenerative disc disease), cervical   . Neuropathy ([place])   . Unsteady gait     FALLS EASILY  . Neuropathy ([place])   . PONV (postoperative nausea and vomiting)   . Shortness of breath dyspnea     with activity   Past Surgical History  Procedure Laterality Date  . Total abdominal hysterectomy    . Hemorrhoid surgery    . Foot surgery  2010    bilateral hammer toes and "knot"  . Colonoscopy  1/11    polyps-hyperplastic and  adenomatous  . Carpal tunnel release Left 01/18/2013    Procedure: CARPAL TUNNEL RELEASE;  Surgeon: Wynonia Sours, MD;  Location: [place];  Service: Orthopedics;  Laterality: Left;  OSTEOTOMY LEFT DISTAL RADIUS BONE CHIPS CARPAL TUNNEL RELEASE LEFT   . Wrist osteotomy Left 01/18/2013    Procedure: WRIST OSTEOTOMY;  Surgeon: Wynonia Sours, MD;  Location: [place];  Service: Orthopedics;  Laterality: Left;  . Open reduction internal fixation (orif) finger with radial bone graft Left 11/01/2013    Procedure: OPEN REDUCTION INTERNAL FIXATION (ORIF) LEFT SMALL FINGER;  Surgeon: Wynonia Sours, MD;  Location: [place] [facility];  Service: Orthopedics;  Laterality: Left;  . Open reduction internal fixation (orif) distal radial fracture  Right 02/13/2015    Procedure: OPEN REDUCTION INTERNAL FIXATION (ORIF) RIGHT DISTAL RADIUS;  Surgeon: Daryll Brod, MD;  Location: [place];  Service: Orthopedics;  Laterality: Right;  . Cataract extraction w/phaco Right 08/07/2015    Procedure: CATARACT EXTRACTION PHACO AND INTRAOCULAR LENS PLACEMENT (IOC);  Surgeon: Birder Robson, MD;  Location: [facility];  Service: Ophthalmology;  Laterality: Right;  [place] 00:48.0 AP   22.3 CDE 10.69 casette lot # [id number]  . Cataract extraction w/phaco Left 09/04/2015    Procedure: CATARACT EXTRACTION PHACO AND INTRAOCULAR LENS PLACEMENT (IOC);  Surgeon: Birder Robson, MD;  Location: [facility];  Service: Ophthalmology;  Laterality: Left;  [place] AP CDE FLUID LOT # [id number]  . Cerebral aneurysm repair  2001    clamps  . Fracture surgery Right 01/2014    result of a fall  . Fracture surgery Left 03/2014    result of a fall  . Knee arthroplasty Left 10/15/2015    Procedure: COMPUTER ASSISTED TOTAL KNEE ARTHROPLASTY;  Surgeon: Dereck Leep, MD;  Location: [facility];  Service: Orthopedics;  Laterality: Left;   Social History  Substance Use Topics  . Smoking status: Former Smoker    Quit date: 10/02/1954  . Smokeless tobacco: Never Used  . Alcohol Use: No     Comment: rare   Family History  Problem Relation Age of Onset  . Colon cancer Mother   . Osteoporosis      hip fracture  . Stroke Brother   . Colon cancer Brother   . Hypertension Brother   . Other Brother     Heart problem  . Colon cancer Brother   . Colon cancer Maternal Aunt    Allergies  Allergen Reactions  . Codeine     REACTION: headache and nausea and vomiting   Current Outpatient Prescriptions on File Prior to Visit  Medication Sig Dispense Refill  . acetaminophen (TYLENOL) 500 MG tablet Take 500 mg by mouth every 6 (six) hours as needed.    . Alum Hydroxide-Mag Carbonate (GAVISCON EXTRA STRENGTH) 160-105 MG CHEW Chew 1 tablet by mouth as needed (acid reflux).    .  Aspirin-Caffeine (BC FAST PAIN RELIEF) 845-65 MG PACK Take 0.5 packets by mouth as needed (head ache).    . Cholecalciferol (VITAMIN D) 2000 UNITS CAPS Take 2 capsules by mouth daily. In am    . cyanocobalamin 1000 MCG tablet Take 1,000 mcg by mouth daily. In am    . diphenhydramine-acetaminophen (TYLENOL PM) 25-500 MG TABS Take 1 tablet by mouth at bedtime as needed.    . enoxaparin (LOVENOX) 30 MG/0.3ML injection Inject 0.3 mLs (30 mg total) into the skin every 12 (twelve) hours. 28 Syringe 0  . IRON PO Take 65  mg by mouth daily. In am    . loperamide (IMODIUM) 2 MG capsule Take by mouth as needed for diarrhea or loose stools.     No current facility-administered medications on file prior to visit.    Review of Systems    Review of Systems  Constitutional: Negative for fever, appetite change, fatigue and unexpected weight change.  Eyes: Negative for pain and visual disturbance.  Respiratory: Negative for cough and shortness of breath.   Cardiovascular: Negative for cp or palpitations    Gastrointestinal: Negative for nausea, diarrhea and constipation.  Genitourinary: Negative for urgency and frequency.  Skin: Negative for pallor or rash   Neurological: Negative for weakness, light-headedness, numbness and headaches. pos for neuropathy symptoms in both legs and L knee, pos for baseline chorea in R leg  Hematological: Negative for adenopathy. Does not bruise/bleed easily.  Psychiatric/Behavioral: Negative for dysphoric mood. The patient is not nervous/anxious.      Objective:   Physical Exam  Constitutional: She appears well-developed and well-nourished. No distress.  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  Mouth/Throat: Oropharynx is clear and moist.  Eyes: Conjunctivae and EOM are normal. Pupils are equal, round, and reactive to light.  Neck: Normal range of motion. Neck supple. No JVD present.  Carotid bruit is not present. No thyromegaly present.  Cardiovascular: Normal rate, regular rhythm, normal heart sounds and intact distal pulses.  Exam reveals no gallop.   Pulmonary/Chest: Effort normal and breath sounds normal. No respiratory distress. She has no wheezes. She has no rales.  No crackles  Abdominal: Soft. Bowel sounds are normal. She exhibits no distension, no abdominal bruit and no mass. There is no tenderness.  Musculoskeletal: She exhibits tenderness. She exhibits no edema.  L knee- well healed scar from recent knee replacement  Minimal swelling /no warmth  rom is only slt limited Pain on full flexion      Lymphadenopathy:    She has no cervical adenopathy.  Neurological: She is alert. She has normal reflexes. No cranial nerve deficit.  Baseline chorea in R leg   Dec sens to lt touch in feet  Walks with a seated walker   Skin: Skin is warm and dry. No rash noted. No pallor.  Psychiatric: She has a normal mood and affect.  Pleasant and talkative           Assessment & Plan:   Problem List Items Addressed This Visit      Nervous and Auditory   Peripheral neuropathy [facility])    Pt's orthopedic surgeon thinks she is healing well from knee repl but ongoing pain may be from her neuropathy  Has taken tramadol in the past-this is an option (aware of fall and habit risk) Disc gabapentin as another option/may also help with sleep Discussed expectations of this medication including time to effectiveness and mechanism of action, also poss of side effects (early and late)- including mental fuzziness, weight or appetite change, nausea and poss of worse dep or anxiety (even suicidal thoughts)  Pt voiced understanding and will stop med and update if this occurs   She will start with 100 mg qhs and titrate up to 300 qhs as tolerated  Then could disc daytime dosing if helpful F/u planned summer /earlier if needed however      Relevant Medications   gabapentin (NEURONTIN)  100 MG capsule     Other  Left knee pain - Primary    Pt's orthopedic surgeon thinks she is healing well from knee repl but ongoing pain may be from her neuropathy  Has taken tramadol in the past-this is an option (aware of fall and habit risk) Disc gabapentin as another option/may also help with sleep Discussed expectations of this medication including time to effectiveness and mechanism of action, also poss of side effects (early and late)- including mental fuzziness, weight or appetite change, nausea and poss of worse dep or anxiety (even suicidal thoughts)  Pt voiced understanding and will stop med and update if this occurs   She will start with 100 mg qhs and titrate up to 300 qhs as tolerated  Then could disc daytime dosing if helpful F/u planned summer /earlier if needed however

## 2016-02-01 NOTE — Patient Instructions (Signed)
Let's try gabapentin for your knee pain and neuropathy  Start with 1 pill (100 mg) at bedtime for week one If doing ok increase to 2 pills at bedtime for week 2 Then if ok - increase to 3 pills at each bedtime  Side effects include sleepiness and dizziness- use with caution - if you feel it is too strong stop at that dose   Follow up with me in summer   We can continue to titrate the dose

## 2016-02-02 NOTE — Assessment & Plan Note (Addendum)
Pt's orthopedic surgeon thinks she is healing well from knee repl but ongoing pain may be from her neuropathy  Has taken tramadol in the past-this is an option (aware of fall and habit risk) Disc gabapentin as another option/may also help with sleep Discussed expectations of this medication including time to effectiveness and mechanism of action, also poss of side effects (early and late)- including mental fuzziness, weight or appetite change, nausea and poss of worse dep or anxiety (even suicidal thoughts)  Pt voiced understanding and will stop med and update if this occurs   She will start with 100 mg qhs and titrate up to 300 qhs as tolerated  Then could disc daytime dosing if helpful F/u planned summer /earlier if needed however

## 2016-02-02 NOTE — Assessment & Plan Note (Signed)
Pt's orthopedic surgeon thinks she is healing well from knee repl but ongoing pain may be from her neuropathy  Has taken tramadol in the past-this is an option (aware of fall and habit risk) Disc gabapentin as another option/may also help with sleep Discussed expectations of this medication including time to effectiveness and mechanism of action, also poss of side effects (early and late)- including mental fuzziness, weight or appetite change, nausea and poss of worse dep or anxiety (even suicidal thoughts)  Pt voiced understanding and will stop med and update if this occurs   She will start with 100 mg qhs and titrate up to 300 qhs as tolerated  Then could disc daytime dosing if helpful F/u planned summer /earlier if needed however

## 2016-02-26 ENCOUNTER — Telehealth: Payer: Self-pay

## 2016-02-26 MED ORDER — GABAPENTIN 300 MG PO CAPS: 300.0000 mg | ORAL_CAPSULE | Freq: Three times a day (TID) | ORAL | Status: DC

## 2016-02-26 NOTE — Telephone Encounter (Signed)
Pt notified Rx sent to pharmacy and advise of Dr. Tower's comments  

## 2016-02-26 NOTE — Telephone Encounter (Signed)
Pt returned call; pt is presently taking gabapentin 100mg  taking 3 pills at hs; no side effects and pt said is helping her neuropathy; per 02/01/16 AVS pt will continue taking gabapentin 100 mg taking 3 pills at hs and if pt has any questions, problems or concerns will cb before f/u apt in summer. Pt voiced understanding.FYI to Dr Glori Bickers.

## 2016-02-26 NOTE — Telephone Encounter (Signed)
Pt left v/m requesting cb about question for gabapentin. Left v/m requesting pt to cb.

## 2016-02-26 NOTE — Telephone Encounter (Signed)
I will send the 300 mg pill to her pharmacy so she can just take 1 pill at bedtime Glad it is helping  [facility] when she is out of what she has

## 2016-02-29 MED ORDER — GABAPENTIN 300 MG PO CAPS: 300.0000 mg | ORAL_CAPSULE | Freq: Every day | ORAL | Status: DC

## 2016-02-29 NOTE — Addendum Note (Signed)
Addended by: Tammi Sou on: 02/29/2016 09:51 AM   Modules accepted: Orders

## 2016-02-29 NOTE — Telephone Encounter (Signed)
Pt contacted office back and states that she has picked up the new Rx for gabapentin 300mg , but is confused about the instruction. She was instructed to take one 300mg  tab qHs but instruction on her bottle indicates 300mg  TID. Pt is requesting a call back to clarify instruction

## 2016-02-29 NOTE — Telephone Encounter (Signed)
It is QHS- tell her to keep taking it qhs and please change inst on px and re send to the pharmacy so it is right next time

## 2016-04-08 DIAGNOSIS — Z96652 Presence of left artificial knee joint: Secondary | ICD-10-CM | POA: Diagnosis not present

## 2016-04-09 DIAGNOSIS — H53462 Homonymous bilateral field defects, left side: Secondary | ICD-10-CM | POA: Diagnosis not present

## 2016-04-14 DIAGNOSIS — M25562 Pain in left knee: Secondary | ICD-10-CM | POA: Diagnosis not present

## 2016-04-14 DIAGNOSIS — G8929 Other chronic pain: Secondary | ICD-10-CM | POA: Diagnosis not present

## 2016-04-18 DIAGNOSIS — G8929 Other chronic pain: Secondary | ICD-10-CM | POA: Diagnosis not present

## 2016-04-18 DIAGNOSIS — M25562 Pain in left knee: Secondary | ICD-10-CM | POA: Diagnosis not present

## 2016-04-21 DIAGNOSIS — G8929 Other chronic pain: Secondary | ICD-10-CM | POA: Diagnosis not present

## 2016-04-21 DIAGNOSIS — M25562 Pain in left knee: Secondary | ICD-10-CM | POA: Diagnosis not present

## 2016-04-30 DIAGNOSIS — M25562 Pain in left knee: Secondary | ICD-10-CM | POA: Diagnosis not present

## 2016-04-30 DIAGNOSIS — G8929 Other chronic pain: Secondary | ICD-10-CM | POA: Diagnosis not present

## 2016-05-05 DIAGNOSIS — G8929 Other chronic pain: Secondary | ICD-10-CM | POA: Diagnosis not present

## 2016-05-05 DIAGNOSIS — M25562 Pain in left knee: Secondary | ICD-10-CM | POA: Diagnosis not present

## 2016-05-09 DIAGNOSIS — M25562 Pain in left knee: Secondary | ICD-10-CM | POA: Diagnosis not present

## 2016-05-09 DIAGNOSIS — G8929 Other chronic pain: Secondary | ICD-10-CM | POA: Diagnosis not present

## 2016-05-19 DIAGNOSIS — G8929 Other chronic pain: Secondary | ICD-10-CM | POA: Diagnosis not present

## 2016-05-19 DIAGNOSIS — M25562 Pain in left knee: Secondary | ICD-10-CM | POA: Diagnosis not present

## 2016-05-23 DIAGNOSIS — M25562 Pain in left knee: Secondary | ICD-10-CM | POA: Diagnosis not present

## 2016-05-23 DIAGNOSIS — G8929 Other chronic pain: Secondary | ICD-10-CM | POA: Diagnosis not present

## 2016-05-30 DIAGNOSIS — G8929 Other chronic pain: Secondary | ICD-10-CM | POA: Diagnosis not present

## 2016-05-30 DIAGNOSIS — M25562 Pain in left knee: Secondary | ICD-10-CM | POA: Diagnosis not present

## 2016-06-04 DIAGNOSIS — M25562 Pain in left knee: Secondary | ICD-10-CM | POA: Diagnosis not present

## 2016-06-04 DIAGNOSIS — G8929 Other chronic pain: Secondary | ICD-10-CM | POA: Diagnosis not present

## 2016-06-05 IMAGING — CT CT HEAD WITHOUT CONTRAST
2 series · 14 of 30 positions shown, 16 images · non-contrast
Comparison: None.

CLINICAL DATA: Status post fall with a blow to the head today.
Laceration on the left side of the head. History of prior surgery
for an aneurysm.

EXAM:
CT HEAD WITHOUT CONTRAST
TECHNIQUE: Contiguous axial images were obtained from the base of the skull
through the vertex without intravenous contrast.

[Series 2: head wo · axial · 0.42mm/px · z∈[+262,+372]mm · 6 of 32 slices shown, 8 images]
[im 5/32  brain]
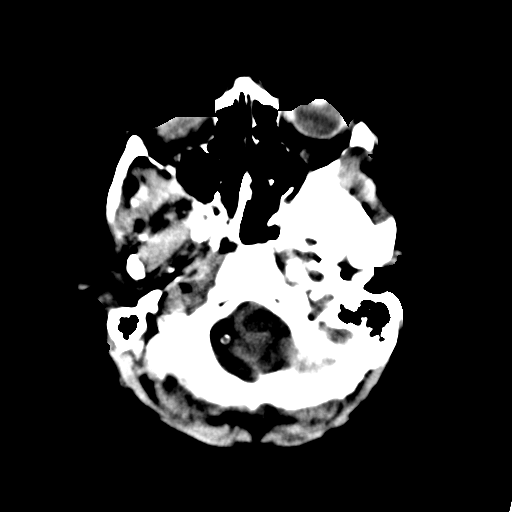
[im 5/32  bone]
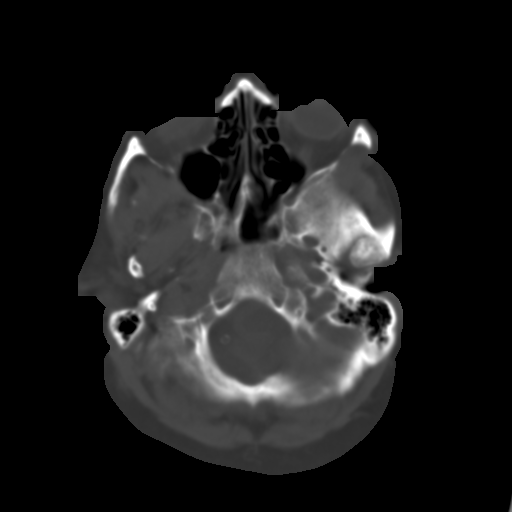
[im 9/32  brain]
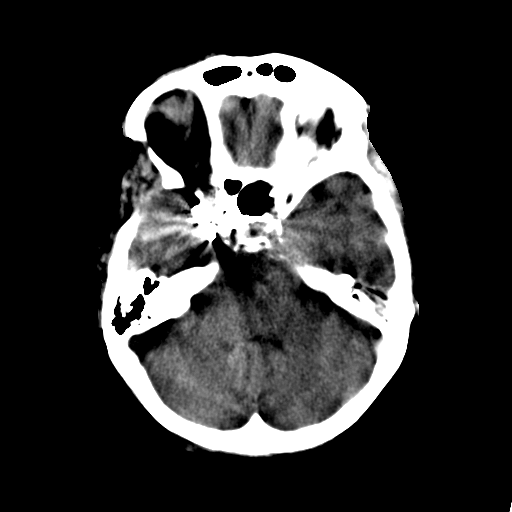
[im 14/32  brain]
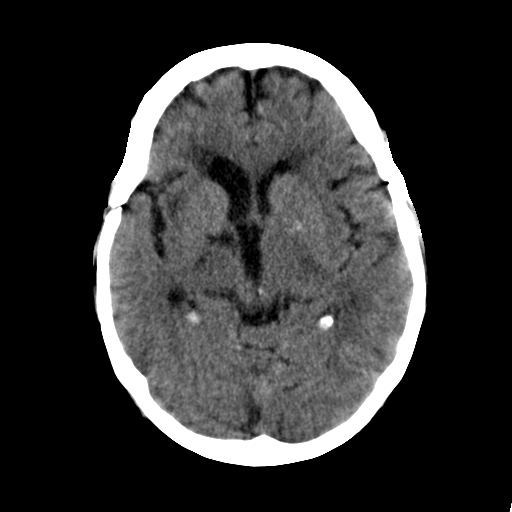
[im 18/32  brain]
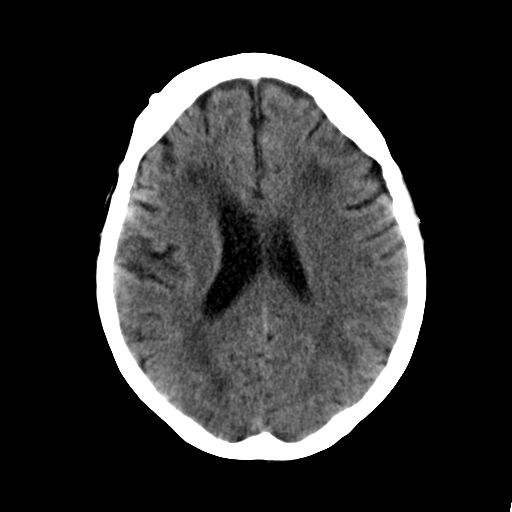
[im 23/32  brain]
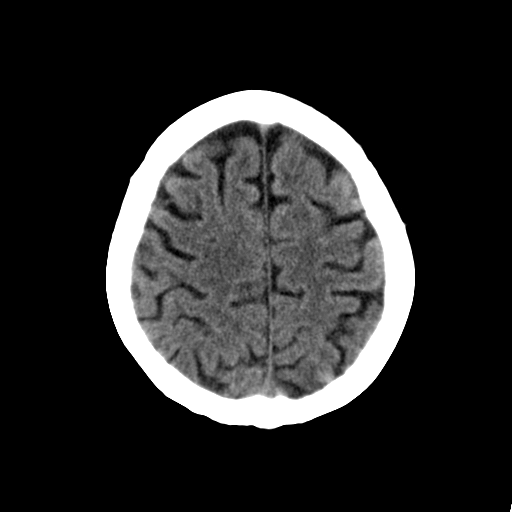
[im 23/32  bone]
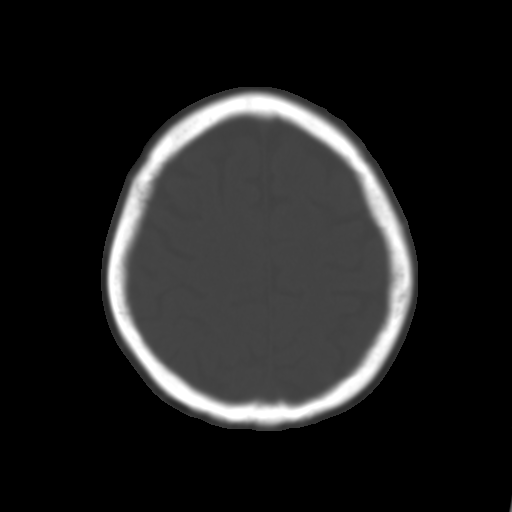
[im 27/32  brain]
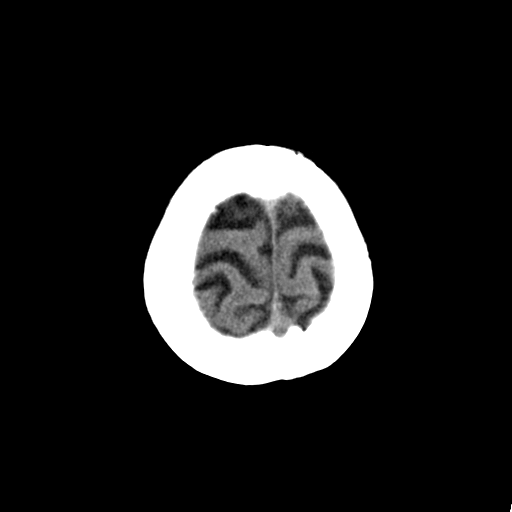

[Series 3: head bone · axial · 0.42mm/px · z∈[+248,+388]mm · 8 of 88 slices shown]
[im 9/88  bone]
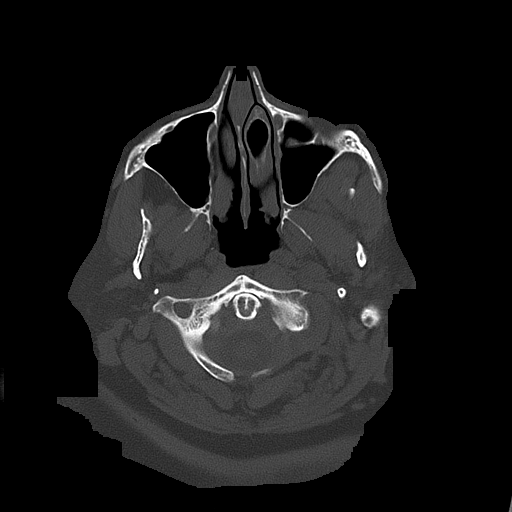
[im 17/88  bone]
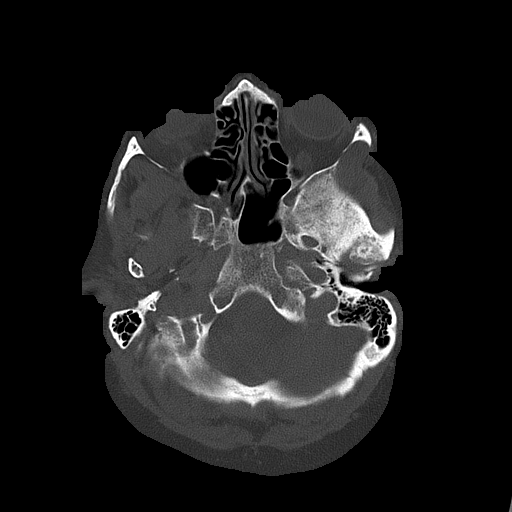
[im 30/88  bone]
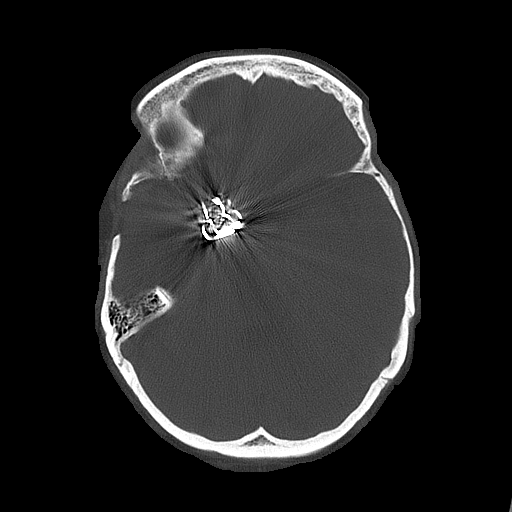
[im 38/88  bone]
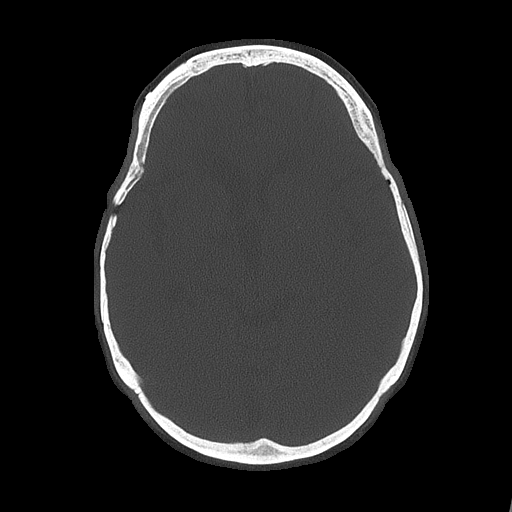
[im 50/88  bone]
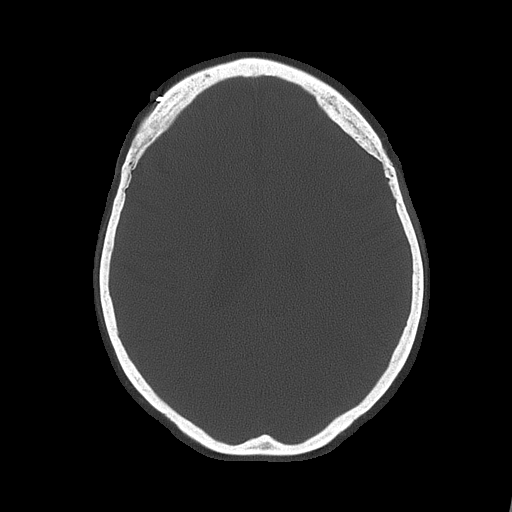
[im 59/88  bone]
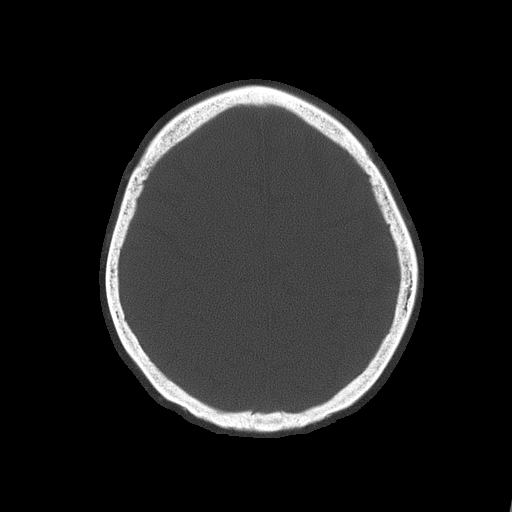
[im 71/88  bone]
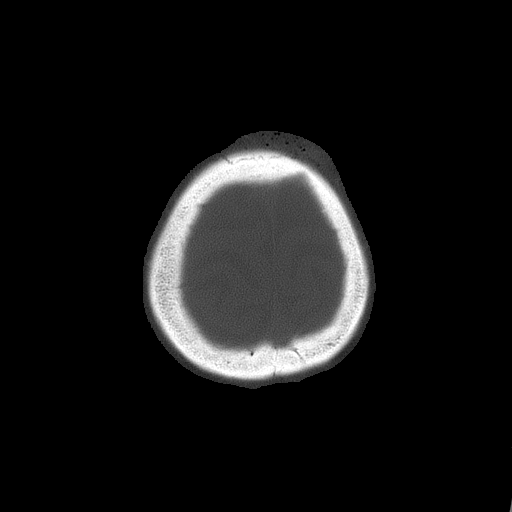
[im 79/88  bone]
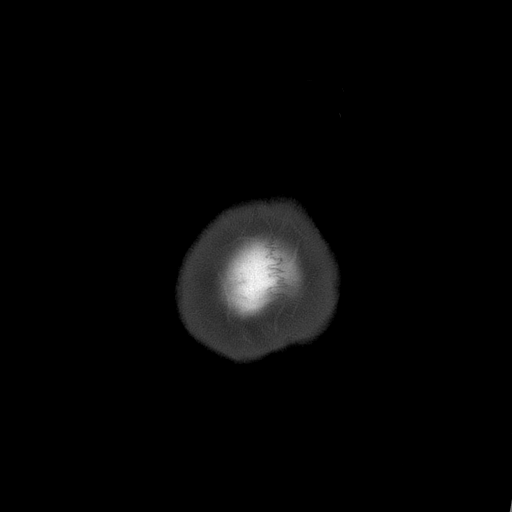

[14 of 30 positions shown; findings below may reference images not displayed]

FINDINGS: Right temporal craniectomy defect is identified. Multiple coils are
seen in the right aspect of the [place]. No evidence of
acute intracranial abnormality including hemorrhage, infarct, mass
lesion, mass effect, midline shift or abnormal extra-axial fluid
collection is identified chronic microvascular ischemic change is
noted.
IMPRESSION: No acute abnormality.

## 2016-06-09 DIAGNOSIS — M25562 Pain in left knee: Secondary | ICD-10-CM | POA: Diagnosis not present

## 2016-06-09 DIAGNOSIS — G8929 Other chronic pain: Secondary | ICD-10-CM | POA: Diagnosis not present

## 2016-06-23 DIAGNOSIS — M25562 Pain in left knee: Secondary | ICD-10-CM | POA: Diagnosis not present

## 2016-06-23 DIAGNOSIS — G8929 Other chronic pain: Secondary | ICD-10-CM | POA: Diagnosis not present

## 2016-06-27 DIAGNOSIS — M25562 Pain in left knee: Secondary | ICD-10-CM | POA: Diagnosis not present

## 2016-06-27 DIAGNOSIS — G8929 Other chronic pain: Secondary | ICD-10-CM | POA: Diagnosis not present

## 2016-06-30 DIAGNOSIS — M25562 Pain in left knee: Secondary | ICD-10-CM | POA: Diagnosis not present

## 2016-06-30 DIAGNOSIS — G8929 Other chronic pain: Secondary | ICD-10-CM | POA: Diagnosis not present

## 2016-07-04 DIAGNOSIS — M25562 Pain in left knee: Secondary | ICD-10-CM | POA: Diagnosis not present

## 2016-07-04 DIAGNOSIS — G8929 Other chronic pain: Secondary | ICD-10-CM | POA: Diagnosis not present

## 2016-07-07 DIAGNOSIS — G8929 Other chronic pain: Secondary | ICD-10-CM | POA: Diagnosis not present

## 2016-07-07 DIAGNOSIS — M25562 Pain in left knee: Secondary | ICD-10-CM | POA: Diagnosis not present

## 2016-07-10 DIAGNOSIS — G8929 Other chronic pain: Secondary | ICD-10-CM | POA: Diagnosis not present

## 2016-07-10 DIAGNOSIS — M25562 Pain in left knee: Secondary | ICD-10-CM | POA: Diagnosis not present

## 2016-07-14 DIAGNOSIS — G8929 Other chronic pain: Secondary | ICD-10-CM | POA: Diagnosis not present

## 2016-07-14 DIAGNOSIS — M25562 Pain in left knee: Secondary | ICD-10-CM | POA: Diagnosis not present

## 2016-07-18 DIAGNOSIS — G8929 Other chronic pain: Secondary | ICD-10-CM | POA: Diagnosis not present

## 2016-07-18 DIAGNOSIS — M25562 Pain in left knee: Secondary | ICD-10-CM | POA: Diagnosis not present

## 2016-07-22 DIAGNOSIS — Z96652 Presence of left artificial knee joint: Secondary | ICD-10-CM | POA: Diagnosis not present

## 2016-07-22 DIAGNOSIS — G629 Polyneuropathy, unspecified: Secondary | ICD-10-CM | POA: Diagnosis not present

## 2016-07-25 ENCOUNTER — Ambulatory Visit (INDEPENDENT_AMBULATORY_CARE_PROVIDER_SITE_OTHER): Payer: Medicare Other

## 2016-07-25 VITALS — BP 120/72 | HR 62 | Temp 97.9°F | Ht 60.5 in | Wt 187.5 lb

## 2016-07-25 DIAGNOSIS — E785 Hyperlipidemia, unspecified: Secondary | ICD-10-CM | POA: Diagnosis not present

## 2016-07-25 DIAGNOSIS — Z0000 Encounter for general adult medical examination without abnormal findings: Secondary | ICD-10-CM

## 2016-07-25 DIAGNOSIS — R5382 Chronic fatigue, unspecified: Secondary | ICD-10-CM

## 2016-07-25 DIAGNOSIS — R531 Weakness: Secondary | ICD-10-CM | POA: Diagnosis not present

## 2016-07-25 DIAGNOSIS — E559 Vitamin D deficiency, unspecified: Secondary | ICD-10-CM

## 2016-07-25 DIAGNOSIS — E538 Deficiency of other specified B group vitamins: Secondary | ICD-10-CM | POA: Diagnosis not present

## 2016-07-25 LAB — COMPREHENSIVE METABOLIC PANEL
ALT: 7 U/L (ref 0–35)
AST: 12 U/L (ref 0–37)
Albumin: 4 g/dL (ref 3.5–5.2)
Alkaline Phosphatase: 77 U/L (ref 39–117)
BUN: 14 mg/dL (ref 6–23)
CO2: 29 mEq/L (ref 19–32)
Calcium: 8.9 mg/dL (ref 8.4–10.5)
Chloride: 105 mEq/L (ref 96–112)
Creatinine, Ser: 0.95 mg/dL (ref 0.40–1.20)
GFR: 59.76 mL/min — ABNORMAL LOW (ref >60.00)
Glucose, Bld: 84 mg/dL (ref 70–99)
Potassium: 4 mEq/L (ref 3.5–5.1)
Sodium: 139 mEq/L (ref 135–145)
Total Bilirubin: 0.4 mg/dL (ref 0.2–1.2)
Total Protein: 7.2 g/dL (ref 6.0–8.3)

## 2016-07-25 LAB — VITAMIN B12: Vitamin B-12: 189 pg/mL — ABNORMAL LOW (ref 211–911)

## 2016-07-25 LAB — LIPID PANEL
Cholesterol: 195 mg/dL (ref 0–200)
HDL: 49.1 mg/dL (ref >39.00)
NonHDL: 146.06
Total CHOL/HDL Ratio: 4
Triglycerides: 235 mg/dL — ABNORMAL HIGH (ref 0.0–149.0)
VLDL: 47 mg/dL — ABNORMAL HIGH (ref 0.0–40.0)

## 2016-07-25 LAB — CBC WITH DIFFERENTIAL/PLATELET
Basophils Absolute: 0 10*3/uL (ref 0.0–0.1)
Basophils Relative: 0.5 % (ref 0.0–3.0)
Eosinophils Absolute: 0.1 10*3/uL (ref 0.0–0.7)
Eosinophils Relative: 0.9 % (ref 0.0–5.0)
HCT: 36.3 % (ref 36.0–46.0)
Hemoglobin: 12.2 g/dL (ref 12.0–15.0)
Lymphocytes Relative: 22.4 % (ref 12.0–46.0)
Lymphs Abs: 1.6 10*3/uL (ref 0.7–4.0)
MCHC: 33.5 g/dL (ref 30.0–36.0)
MCV: 84 fl (ref 78.0–100.0)
Monocytes Absolute: 0.5 10*3/uL (ref 0.1–1.0)
Monocytes Relative: 7.2 % (ref 3.0–12.0)
Neutro Abs: 4.9 10*3/uL (ref 1.4–7.7)
Neutrophils Relative %: 69 % (ref 43.0–77.0)
Platelets: 265 10*3/uL (ref 150.0–400.0)
RBC: 4.32 Mil/uL (ref 3.87–5.11)
RDW: 15.1 % (ref 11.5–15.5)
WBC: 7.1 10*3/uL (ref 4.0–10.5)

## 2016-07-25 LAB — LDL CHOLESTEROL, DIRECT: Direct LDL: 109 mg/dL

## 2016-07-25 LAB — TSH: TSH: 1.13 u[IU]/mL (ref 0.35–4.50)

## 2016-07-25 LAB — FERRITIN: Ferritin: 12.3 ng/mL (ref 10.0–291.0)

## 2016-07-25 LAB — VITAMIN D 25 HYDROXY (VIT D DEFICIENCY, FRACTURES): VITD: 22.47 ng/mL — ABNORMAL LOW (ref 30.00–100.00)

## 2016-07-25 NOTE — Progress Notes (Signed)
PCP notes:   Health maintenance:  Mammogram - pt will schedule in Nov 2017 Flu vaccine - addressed  Abnormal screenings:   Depression score: 1 Fall risk: hx of multiple falls with injury  Patient concerns:   None  Nurse concerns:  None  Next PCP appt:   08/08/16 @ 1515  I reviewed health advisor's note, was available for consultation, and agree with documentation and plan.  [name] MD

## 2016-07-25 NOTE — Patient Instructions (Addendum)
Julie [name] , Thank you for taking time to come for your Medicare Wellness Visit. I appreciate your ongoing commitment to your health goals. Please review the following plan we discussed and let me know if I can assist you in the future.   These are the goals we discussed: Goals    . Increase water intake          Starting 07/25/2016, I will attempt to drink at least 6-8 glasses of water daily.        This is a list of the screening recommended for you and due dates:  Health Maintenance  Topic Date Due  . Mammogram  10/24/2016*  . Flu Shot  11/30/2016*  . Tetanus Vaccine  03/25/2025  . DEXA scan (bone density measurement)  Completed  . Shingles Vaccine  Completed  . Pneumonia vaccines  Completed  *Topic was postponed. The date shown is not the original due date.   Preventive Care for Adults  A healthy lifestyle and preventive care can promote health and wellness. Preventive health guidelines for adults include the following key practices.  . A routine yearly physical is a good way to check with your health care provider about your health and preventive screening. It is a chance to share any concerns and updates on your health and to receive a thorough exam.  . Visit your dentist for a routine exam and preventive care every 6 months. Brush your teeth twice a day and floss once a day. Good oral hygiene prevents tooth decay and gum disease.  . The frequency of eye exams is based on your age, health, family medical history, use  of contact lenses, and other factors. Follow your health care provider's ecommendations for frequency of eye exams.  . Eat a healthy diet. Foods like vegetables, fruits, whole grains, low-fat dairy products, and lean protein foods contain the nutrients you need without too many calories. Decrease your intake of foods high in solid fats, added sugars, and salt. Eat the right amount of calories for you. Get information about a proper diet from your health care  provider, if necessary.  . Regular physical exercise is one of the most important things you can do for your health. Most adults should get at least 150 minutes of moderate-intensity exercise (any activity that increases your heart rate and causes you to sweat) each week. In addition, most adults need muscle-strengthening exercises on 2 or more days a week.  Silver Sneakers may be a benefit available to you. To determine eligibility, you may visit the website: www.silversneakers.com or contact program at (605)415-9903 Mon-Fri between 8AM-8PM.   . Maintain a healthy weight. The body mass index (BMI) is a screening tool to identify possible weight problems. It provides an estimate of body fat based on height and weight. Your health care provider can find your BMI and can help you achieve or maintain a healthy weight.   For adults 20 years and older: ? A BMI below 18.5 is considered underweight. ? A BMI of 18.5 to 24.9 is normal. ? A BMI of 25 to 29.9 is considered overweight. ? A BMI of 30 and above is considered obese.   . Maintain normal blood lipids and cholesterol levels by exercising and minimizing your intake of saturated fat. Eat a balanced diet with plenty of fruit and vegetables. Blood tests for lipids and cholesterol should begin at age 18 and be repeated every 5 years. If your lipid or cholesterol levels are high, you are over  50, or you are at high risk for heart disease, you may need your cholesterol levels checked more frequently. Ongoing high lipid and cholesterol levels should be treated with medicines if diet and exercise are not working.  . If you smoke, find out from your health care provider how to quit. If you do not use tobacco, please do not start.  . If you choose to drink alcohol, please do not consume more than 2 drinks per day. One drink is considered to be 12 ounces (355 mL) of beer, 5 ounces (148 mL) of wine, or 1.5 ounces (44 mL) of liquor.  . If you are 13-79 years  old, ask your health care provider if you should take aspirin to prevent strokes.  . Use sunscreen. Apply sunscreen liberally and repeatedly throughout the day. You should seek shade when your shadow is shorter than you. Protect yourself by wearing long sleeves, pants, a wide-brimmed hat, and sunglasses year round, whenever you are outdoors.  . Once a month, do a whole body skin exam, using a mirror to look at the skin on your back. Tell your health care provider of new moles, moles that have irregular borders, moles that are larger than a pencil eraser, or moles that have changed in shape or color.     Fall Prevention in the Home  Falls can cause injuries. They can happen to people of all ages. There are many things you can do to make your home safe and to help prevent falls.  WHAT CAN I DO ON THE OUTSIDE OF MY HOME?  Regularly fix the edges of walkways and driveways and fix any cracks.  Remove anything that might make you trip as you walk through a door, such as a raised step or threshold.  Trim any bushes or trees on the path to your home.  Use bright outdoor lighting.  Clear any walking paths of anything that might make someone trip, such as rocks or tools.  Regularly check to see if handrails are loose or broken. Make sure that both sides of any steps have handrails.  Any raised decks and porches should have guardrails on the edges.  Have any leaves, snow, or ice cleared regularly.  Use sand or salt on walking paths during winter.  Clean up any spills in your garage right away. This includes oil or grease spills. WHAT CAN I DO IN THE BATHROOM?   Use night lights.  Install grab bars by the toilet and in the tub and shower. Do not use towel bars as grab bars.  Use non-skid mats or decals in the tub or shower.  If you need to sit down in the shower, use a plastic, non-slip stool.  Keep the floor dry. Clean up any water that spills on the floor as soon as it  happens.  Remove soap buildup in the tub or shower regularly.  Attach bath mats securely with double-sided non-slip rug tape.  Do not have throw rugs and other things on the floor that can make you trip. WHAT CAN I DO IN THE BEDROOM?  Use night lights.  Make sure that you have a light by your bed that is easy to reach.  Do not use any sheets or blankets that are too big for your bed. They should not hang down onto the floor.  Have a firm chair that has side arms. You can use this for support while you get dressed.  Do not have throw rugs and other things  on the floor that can make you trip. WHAT CAN I DO IN THE KITCHEN?  Clean up any spills right away.  Avoid walking on wet floors.  Keep items that you use a lot in easy-to-reach places.  If you need to reach something above you, use a strong step stool that has a grab bar.  Keep electrical cords out of the way.  Do not use floor polish or wax that makes floors slippery. If you must use wax, use non-skid floor wax.  Do not have throw rugs and other things on the floor that can make you trip. WHAT CAN I DO WITH MY STAIRS?  Do not leave any items on the stairs.  Make sure that there are handrails on both sides of the stairs and use them. Fix handrails that are broken or loose. Make sure that handrails are as long as the stairways.  Check any carpeting to make sure that it is firmly attached to the stairs. Fix any carpet that is loose or worn.  Avoid having throw rugs at the top or bottom of the stairs. If you do have throw rugs, attach them to the floor with carpet tape.  Make sure that you have a light switch at the top of the stairs and the bottom of the stairs. If you do not have them, ask someone to add them for you. WHAT ELSE CAN I DO TO HELP PREVENT FALLS?  Wear shoes that:  Do not have high heels.  Have rubber bottoms.  Are comfortable and fit you well.  Are closed at the toe. Do not wear sandals.  If you  use a stepladder:  Make sure that it is fully opened. Do not climb a closed stepladder.  Make sure that both sides of the stepladder are locked into place.  Ask someone to hold it for you, if possible.  Clearly mark and make sure that you can see:  Any grab bars or handrails.  First and last steps.  Where the edge of each step is.  Use tools that help you move around (mobility aids) if they are needed. These include:  Canes.  Walkers.  Scooters.  Crutches.  Turn on the lights when you go into a dark area. Replace any light bulbs as soon as they burn out.  Set up your furniture so you have a clear path. Avoid moving your furniture around.  If any of your floors are uneven, fix them.  If there are any pets around you, be aware of where they are.  Review your medicines with your doctor. Some medicines can make you feel dizzy. This can increase your chance of falling. Ask your doctor what other things that you can do to help prevent falls.   This information is not intended to replace advice given to you by your health care provider. Make sure you discuss any questions you have with your health care provider.   Document Released: 09/13/2009 Document Revised: 04/03/2015 Document Reviewed: 12/22/2014 Elsevier Interactive Patient Education [employer].

## 2016-07-25 NOTE — Progress Notes (Signed)
Pre visit review using our clinic review tool, if applicable. No additional management support is needed unless otherwise documented below in the visit note. 

## 2016-07-25 NOTE — Progress Notes (Signed)
Subjective:   Julie [name] is a 82 y.o. female who presents for Medicare Annual (Subsequent) preventive examination.  Review of Systems:  N/A Cardiac Risk Factors include: advanced age (>73men, >7 women);sedentary lifestyle;obesity (BMI >30kg/m2)     Objective:     Vitals: BP 120/72 (BP Location: Left Arm, Patient Position: Sitting, Cuff Size: Normal)   Pulse 62   Temp 97.9 F (36.6 C) (Oral)   Ht 5' 0.5" (1.537 m) Comment: no shoes  Wt 187 lb 8 oz (85 kg)   SpO2 95%   BMI 36.02 kg/m   Body mass index is 36.02 kg/m.   Tobacco History  Smoking Status  . Former Smoker  . Quit date: 10/02/1954  Smokeless Tobacco  . Never Used     Counseling given: No   Past Medical History:  Diagnosis Date  . Adenoma   . Chronic leg pain   . Colon polyp   . DDD (degenerative disc disease), cervical   . Diverticulosis of colon    internal----Dr. Vira Agar  . Duodenitis   . Esophagitis   . GERD (gastroesophageal reflux disease)   . Hemorrhoids   . HLD (hyperlipidemia)   . IBS (irritable bowel syndrome)    with PP diarrhea  . Neuropathy ([place])   . Neuropathy ([place])   . OA (osteoarthritis) of knee   . Osteopenia   . PONV (postoperative nausea and vomiting)   . Scoliosis   . Seborrheic dermatitis   . Shortness of breath dyspnea    with activity  . TMJ syndrome   . Trochanteric bursitis   . Unsteady gait    FALLS EASILY   Past Surgical History:  Procedure Laterality Date  . CARPAL TUNNEL RELEASE Left 01/18/2013   Procedure: CARPAL TUNNEL RELEASE;  Surgeon: Wynonia Sours, MD;  Location: [facility];  Service: Orthopedics;  Laterality: Left;  OSTEOTOMY LEFT DISTAL RADIUS BONE CHIPS CARPAL TUNNEL RELEASE LEFT   . CATARACT EXTRACTION W/PHACO Right 08/07/2015   Procedure: CATARACT EXTRACTION PHACO AND INTRAOCULAR LENS PLACEMENT (IOC);  Surgeon: Birder Robson, MD;  Location: [facility];  Service: Ophthalmology;  Laterality: Right;  [place] 00:48.0 AP   22.3 CDE  10.69 casette lot # [id number]  . CATARACT EXTRACTION W/PHACO Left 09/04/2015   Procedure: CATARACT EXTRACTION PHACO AND INTRAOCULAR LENS PLACEMENT (IOC);  Surgeon: Birder Robson, MD;  Location: [facility];  Service: Ophthalmology;  Laterality: Left;  [place] AP CDE FLUID LOT # [id number]  . CEREBRAL ANEURYSM REPAIR  2001   clamps  . COLONOSCOPY  1/11   polyps-hyperplastic and adenomatous  . FOOT SURGERY  2010   bilateral hammer toes and "knot"  . FRACTURE SURGERY Right 01/2014   result of a fall  . FRACTURE SURGERY Left 03/2014   result of a fall  . HEMORRHOID SURGERY    . KNEE ARTHROPLASTY Left 10/15/2015   Procedure: COMPUTER ASSISTED TOTAL KNEE ARTHROPLASTY;  Surgeon: Dereck Leep, MD;  Location: [facility];  Service: Orthopedics;  Laterality: Left;  . OPEN REDUCTION INTERNAL FIXATION (ORIF) DISTAL RADIAL FRACTURE Right 02/13/2015   Procedure: OPEN REDUCTION INTERNAL FIXATION (ORIF) RIGHT DISTAL RADIUS;  Surgeon: Daryll Brod, MD;  Location: [facility];  Service: Orthopedics;  Laterality: Right;  . OPEN REDUCTION INTERNAL FIXATION (ORIF) FINGER WITH RADIAL BONE GRAFT Left 11/01/2013   Procedure: OPEN REDUCTION INTERNAL FIXATION (ORIF) LEFT SMALL FINGER;  Surgeon: Wynonia Sours, MD;  Location: [facility];  Service: Orthopedics;  Laterality: Left;  .  TOTAL ABDOMINAL HYSTERECTOMY    . WRIST OSTEOTOMY Left 01/18/2013   Procedure: WRIST OSTEOTOMY;  Surgeon: Wynonia Sours, MD;  Location: [facility];  Service: Orthopedics;  Laterality: Left;   Family History  Problem Relation Age of Onset  . Colon cancer Mother   . Osteoporosis      hip fracture  . Stroke Brother   . Colon cancer Brother   . Hypertension Brother   . Other Brother     Heart problem  . Colon cancer Brother   . Colon cancer Maternal Aunt    History  Sexual Activity  . Sexual activity: No    Comment: smoked alittle as teen    Outpatient Encounter Prescriptions as of 07/25/2016   Medication Sig  . acetaminophen (TYLENOL) 500 MG tablet Take 500 mg by mouth every 6 (six) hours as needed.  . Alum Hydroxide-Mag Carbonate (GAVISCON EXTRA STRENGTH) 160-105 MG CHEW Chew 1 tablet by mouth as needed (acid reflux).  . Cholecalciferol (VITAMIN D) 2000 UNITS CAPS Take 2 capsules by mouth daily. In am  . cyanocobalamin 1000 MCG tablet Take 1,000 mcg by mouth daily. In am  . gabapentin (NEURONTIN) 300 MG capsule Take 1 capsule (300 mg total) by mouth at bedtime.  Marland Kitchen loperamide (IMODIUM) 2 MG capsule Take by mouth as needed for diarrhea or loose stools.  . [DISCONTINUED] Aspirin-Caffeine (BC FAST PAIN RELIEF) 845-65 MG PACK Take 0.5 packets by mouth as needed (head ache).  . [DISCONTINUED] diphenhydramine-acetaminophen (TYLENOL PM) 25-500 MG TABS Take 1 tablet by mouth at bedtime as needed.  . [DISCONTINUED] enoxaparin (LOVENOX) 30 MG/0.3ML injection Inject 0.3 mLs (30 mg total) into the skin every 12 (twelve) hours.  . [DISCONTINUED] IRON PO Take 65 mg by mouth daily. In am   No facility-administered encounter medications on file as of 07/25/2016.     Activities of Daily Living In your present state of health, do you have any difficulty performing the following activities: 07/25/2016 10/15/2015  Hearing? N -  Vision? N -  Difficulty concentrating or making decisions? N -  Walking or climbing stairs? N -  Dressing or bathing? N -  Doing errands, shopping? N Y  Conservation officer, nature and eating ? N -  Using the Toilet? N -  In the past six months, have you accidently leaked urine? Y -  Do you have problems with loss of bowel control? N -  Managing your Medications? N -  Managing your Finances? N -  Housekeeping or managing your Housekeeping? N -  Some recent data might be hidden    Patient Care Team: Abner Greenspan, MD as PCP - [place], MD as Consulting Physician (Orthopedic Surgery)    Assessment:     Hearing Screening   125Hz  250Hz  500Hz  1000Hz  2000Hz  3000Hz   4000Hz  6000Hz  8000Hz   Right ear:   40 40 40  40    Left ear:   40 40 40  40    Vision Screening Comments: Last vision exam in 2017 @ [place] and Dietary recommendations Current Exercise Habits: The patient does not participate in regular exercise at present, Exercise limited by: orthopedic condition(s)  Goals    . Increase water intake          Starting 07/25/2016, I will attempt to drink at least 6-8 glasses of water daily.       Fall Risk Fall Risk  07/25/2016 09/22/2014 07/14/2013  Falls in the past  year? Yes Yes Yes  Number falls in past yr: 2 or more 2 or more 1  Injury with Fall? Yes - -  Risk Factor Category  High Fall Risk - -  Risk for fall due to : History of fall(s);Impaired balance/gait;Impaired mobility History of fall(s) Impaired balance/gait  Follow up Falls evaluation completed;Education provided - -   Depression Screen PHQ 2/9 Scores 07/25/2016 09/22/2014 07/14/2013  PHQ - 2 Score 1 0 0     Cognitive Testing MMSE - Mini Mental State Exam 07/25/2016  Orientation to time 5  Orientation to Place 5  Registration 3  Attention/ Calculation 0  Recall 3  Language- name 2 objects 0  Language- repeat 1  Language- follow 3 step command 3  Language- read & follow direction 0  Write a sentence 0  Copy design 0  Total score 20   PLEASE NOTE: A Mini-Cog screen was completed. Maximum score is 20. A value of 0 denotes this part of [place] MMSE was not completed or the patient failed this part of the Mini-Cog screening.   Mini-Cog Screening Orientation to Time - Max 5 pts Orientation to Place - Max 5 pts Registration - Max 3 pts Recall - Max 3 pts Language Repeat - Max 1 pts Language Follow 3 Step Command - Max 3 pts   Immunization History  Administered Date(s) Administered  . Influenza-Unspecified 09/08/2014, 09/15/2015  . Pneumococcal Conjugate-13 09/22/2014  . Pneumococcal Polysaccharide-23 07/13/2013  . Td 05/01/2005,  03/26/2015  . Zoster 08/31/2013   Screening Tests Health Maintenance  Topic Date Due  . MAMMOGRAM  10/24/2016 (Originally 10/26/2015)  . INFLUENZA VACCINE  11/30/2016 (Originally 07/01/2016)  . TETANUS/TDAP  03/25/2025  . DEXA SCAN  Completed  . ZOSTAVAX  Completed  . PNA vac Low Risk Adult  Completed      Plan:     I have personally reviewed and addressed the Medicare Annual Wellness questionnaire and have noted the following in the patient's chart:  A. Medical and social history B. Use of alcohol, tobacco or illicit drugs  C. Current medications and supplements D. Functional ability and status E.  Nutritional status F.  Physical activity G. Advance directives H. List of other physicians I.  Hospitalizations, surgeries, and ER visits in previous 12 months J.  [place] to include hearing, vision, cognitive, depression L. Referrals and appointments - none  In addition, I have reviewed and discussed with patient certain preventive protocols, quality metrics, and best practice recommendations. A written personalized care plan for preventive services as well as general preventive health recommendations were provided to patient.  See attached scanned questionnaire for additional information.   Signed,   Lindell Noe, MHA, BS, LPN Health Advisor

## 2016-08-01 ENCOUNTER — Encounter: Payer: Self-pay | Admitting: Neurology

## 2016-08-01 ENCOUNTER — Ambulatory Visit (INDEPENDENT_AMBULATORY_CARE_PROVIDER_SITE_OTHER): Payer: Medicare Other | Admitting: Neurology

## 2016-08-01 VITALS — BP 108/78 | HR 69 | Ht 63.5 in | Wt 187.0 lb

## 2016-08-01 DIAGNOSIS — G20 Parkinson's disease: Secondary | ICD-10-CM

## 2016-08-01 DIAGNOSIS — G255 Other chorea: Secondary | ICD-10-CM | POA: Diagnosis not present

## 2016-08-01 MED ORDER — CARBIDOPA-LEVODOPA 25-100 MG PO TABS: 1.0000 | ORAL_TABLET | Freq: Three times a day (TID) | ORAL | 2 refills | Status: DC

## 2016-08-01 NOTE — Patient Instructions (Signed)
1. Start Carbidopa Levodopa as follows:  Take 1/2 tablet three times daily, at least 30 minutes before meals, for one week  Then take 1/2 tablet in the morning, 1/2 tablet in the afternoon, 1 tablet in the evening, at least 30 minutes before meals, for one week  Then take 1/2 tablet in the morning, 1 tablet in the afternoon, 1 tablet in the evening, at least 30 minutes before meals, for one week  Then take 1 tablet three times daily, at least 30 minutes before meals  

## 2016-08-01 NOTE — Progress Notes (Signed)
Julie [name] was seen today in neurologic consultation at the request of [name], MD.  The consultation is for the evaluation of falls and gait instability.  The records that were made available to me were reviewed.  No one accompanies her back to the room.  Pt states that she has had balance issues for "about a year," although she saw Dr. [name] for the same in 2012.  She states that she is having a lot of falls with no warning beforehand.  She doesn't feel dizzy or lightheaded.  Just about 4-6 weeks ago, she broke her wrist and had to have surgery.  Then a week ago, she fell over the lift chair and she had to have staples in the head.  She estimates that she has a fall a few times a month.  She has used a walker for a few years; she has never had a fall when she was actually using her walker.  Her legs don't feel weak.  No swallowing trouble.  States that her speech has not changed, but her friends all state that she talks "too low."  She has no tremor.  No urinary incontinence but may rarely have an accident.  She denies paresthesias of the feet.  Her sister is 81 years older than she is and she has had some falls as well, but not as frequent as the pt (and states that her sister is blind from macular degeneration).   She denies any abnormal movements, but then says "I think I must be nervous in the hands as they move all the time."  She states that her husband has had to help her dress since 2014, when she broke her left wrist, and she can no longer hook her bra.   She has not driven since aneurysm surgery in 2001 because of peripheral vision loss.    Pt has seen Dr. [name] in 09/2011 for the same and I reviewed his note (only one note I have).  He ordered an MRI of the brain was completed on 10/06/2011.  I only have the report.  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  She apparently had an EMG done of the lower extremities that was reported to be unremarkable.  She has been seen at [facility] for her aneurysm and that is where it was clipped in 2001.  She states that she did well after surgery but did lose peripheral vision on the L after surgery.    05/15/15 update:  Pt seen for f/u.  Has had multiple tests done.  EMG demonstrated moderate PN.  MRI brain done at [facility] demonstrating artifact secondary to prior aneurysm clipping and moderate small vessel disease.  She had multiple lab tests done.  Her B12 was a bit low at 262 and she was told to start an oral supplement.  She had other testing including copper, ceruloplasmin, ESR (19), ANA, ferritin, rpr (neg), cpk and aldolase and all were normal.  Celiac panel was normal.  She saw Charlann Boxer and just had a custom orthotic made for mild foot drop.  She has had no falls since last visit.  Is using her walker most of the time.  She does not notice the movements of the right leg.  06/05/15 update:  The patient follows up today unexpectedly.  Last visit, I had given her a form to  have lab work done for chorea of the right leg.  This was an Programme researcher, broadcasting/film/video.  She talked to [place], received the packet and her copay was $250.  She googled HD and wanted to talk further.  States that if there is no cure, she really wants to go no further with testing.  [place] with church and worries about falls/getting on bus.  08/01/16 update:  Pt f/u today at the urging of her ortho surgeon.  I got records from Dr. Lake Bells stating that the patient had a L TKA and he told the pt she needed f/u with me.  Pt states that she is so hesitant now that she cannot get by without her walker.  She is having significant cramping of the toes on the left and of the gastroc, mostly at night but during the day as well.    ALLERGIES:   Allergies  Allergen Reactions  . Codeine     REACTION: headache and nausea and  vomiting    CURRENT MEDICATIONS:  Outpatient Encounter Prescriptions as of 08/01/2016  Medication Sig  . acetaminophen (TYLENOL) 500 MG tablet Take 500 mg by mouth every 6 (six) hours as needed.  . Alum Hydroxide-Mag Carbonate (GAVISCON EXTRA STRENGTH) 160-105 MG CHEW Chew 1 tablet by mouth as needed (acid reflux).  . cyanocobalamin 1000 MCG tablet Take 1,000 mcg by mouth daily. In am  . gabapentin (NEURONTIN) 300 MG capsule Take 1 capsule (300 mg total) by mouth at bedtime.  Marland Kitchen loperamide (IMODIUM) 2 MG capsule Take by mouth as needed for diarrhea or loose stools.  . [DISCONTINUED] Cholecalciferol (VITAMIN D) 2000 UNITS CAPS Take 2 capsules by mouth daily. In am   No facility-administered encounter medications on file as of 08/01/2016.     PAST MEDICAL HISTORY:   Past Medical History:  Diagnosis Date  . Adenoma   . Chronic leg pain   . Colon polyp   . DDD (degenerative disc disease), cervical   . Diverticulosis of colon    internal----Dr. Vira Agar  . Duodenitis   . Esophagitis   . GERD (gastroesophageal reflux disease)   . Hemorrhoids   . HLD (hyperlipidemia)   . IBS (irritable bowel syndrome)    with PP diarrhea  . Neuropathy ([place])   . Neuropathy ([place])   . OA (osteoarthritis) of knee   . Osteopenia   . PONV (postoperative nausea and vomiting)   . Scoliosis   . Seborrheic dermatitis   . Shortness of breath dyspnea    with activity  . TMJ syndrome   . Trochanteric bursitis   . Unsteady gait    FALLS EASILY    PAST SURGICAL HISTORY:   Past Surgical History:  Procedure Laterality Date  . CARPAL TUNNEL RELEASE Left 01/18/2013   Procedure: CARPAL TUNNEL RELEASE;  Surgeon: Wynonia Sours, MD;  Location: [place];  Service: Orthopedics;  Laterality: Left;  OSTEOTOMY LEFT DISTAL RADIUS BONE CHIPS CARPAL TUNNEL RELEASE LEFT   . CATARACT EXTRACTION W/PHACO Right 08/07/2015   Procedure: CATARACT EXTRACTION PHACO AND INTRAOCULAR LENS PLACEMENT (IOC);  Surgeon: Birder Robson, MD;  Location: [facility];  Service: Ophthalmology;  Laterality: Right;  [place] 00:48.0 AP   22.3 CDE 10.69 casette lot # [id number]  . CATARACT EXTRACTION W/PHACO Left 09/04/2015   Procedure: CATARACT EXTRACTION PHACO AND INTRAOCULAR LENS PLACEMENT (IOC);  Surgeon: Birder Robson, MD;  Location: [facility];  Service: Ophthalmology;  Laterality: Left;  [place] AP  CDE FLUID LOT # [id number]  . CEREBRAL ANEURYSM REPAIR  2001   clamps  . COLONOSCOPY  1/11   polyps-hyperplastic and adenomatous  . FOOT SURGERY  2010   bilateral hammer toes and "knot"  . FRACTURE SURGERY Right 01/2014   result of a fall  . FRACTURE SURGERY Left 03/2014   result of a fall  . HEMORRHOID SURGERY    . KNEE ARTHROPLASTY Left 10/15/2015   Procedure: COMPUTER ASSISTED TOTAL KNEE ARTHROPLASTY;  Surgeon: Dereck Leep, MD;  Location: [facility];  Service: Orthopedics;  Laterality: Left;  . OPEN REDUCTION INTERNAL FIXATION (ORIF) DISTAL RADIAL FRACTURE Right 02/13/2015   Procedure: OPEN REDUCTION INTERNAL FIXATION (ORIF) RIGHT DISTAL RADIUS;  Surgeon: Daryll Brod, MD;  Location: [place];  Service: Orthopedics;  Laterality: Right;  . OPEN REDUCTION INTERNAL FIXATION (ORIF) FINGER WITH RADIAL BONE GRAFT Left 11/01/2013   Procedure: OPEN REDUCTION INTERNAL FIXATION (ORIF) LEFT SMALL FINGER;  Surgeon: Wynonia Sours, MD;  Location: [place];  Service: Orthopedics;  Laterality: Left;  . TOTAL ABDOMINAL HYSTERECTOMY    . WRIST OSTEOTOMY Left 01/18/2013   Procedure: WRIST OSTEOTOMY;  Surgeon: Wynonia Sours, MD;  Location: [place];  Service: Orthopedics;  Laterality: Left;    SOCIAL HISTORY:   Social History   Social History  . Marital status: Married    Spouse name: N/A  . Number of children: N/A  . Years of education: N/A   Occupational History  . Retired     Radiation protection practitioner   Social History Main Topics  . Smoking status: Former Smoker    Quit date: 10/02/1954  . Smokeless  tobacco: Never Used  . Alcohol use No     Comment: rare  . Drug use: No  . Sexual activity: No     Comment: smoked alittle as teen   Other Topics Concern  . Not on file   Social History Narrative   Retired      Married      Regular exercise          FAMILY HISTORY:   Family Status  Relation Status  . Mother Deceased   colon cancer  . Father Deceased   complications of ?cataract surgery  . Brother Deceased   3, colon CA, COPD  . Sister Deceased   ? CAD  . Sister Alive   2, macular degeneration,   . Child Alive   2, healthy  .    Marland Kitchen Brother   . Brother   . Brother   . Brother   . Maternal Aunt     ROS:  A complete 10 system review of systems was obtained and was unremarkable apart from what is mentioned above.  PHYSICAL EXAMINATION:    VITALS:   Vitals:   08/01/16 1519  BP: 108/78  Pulse: 69  Weight: 187 lb (84.8 kg)  Height: 5' 3.5" (1.613 m)     GEN:  Normal appears female in no acute distress.  Appears stated age. CV:  RRR Lungs:  CTAB Neck: no bruits   NEUROLOGICAL: Orientation:  The patient is alert and oriented x 3.  Cranial nerves: There is good facial symmetry. . Extraocular muscles are intact and there is a left homonymous hemianopsia.   Speech is fluent and clear.   Soft palate rises symmetrically and there is no tongue deviation. Hearing is intact to conversational tone. Tone: Tone is good throughout. Coordination:  The patient has some trouble with  finger taps on the L.   Gait and Station: The patient ambulates with a walker.  She has her foot brace on and does well with this.   Abnormal movements: No tremor.  She does have a choreiform-like movement of the right leg that she does not notice.  There is some dystonic like curling of the toes on the L foot.  IMPRESSION/PLAN  1.  Peripheral neuropathy  -Is moderate in nature and certainly could explain her falls.  She and I again discussed the nature of peripheral neuropathy.  She has a  mild B12 deficiency and is currently on oral replacement.   2.  R leg chorea  -suspect ischemic.  MRI with only small vessel disease but significant artifact on it from prior aneurysm clipping.  She refuses further workup for chorea. 3.  Mild parkinsonism  -Overall incredibly mild and really does not meet criteria for Parkinson's disease.  However, given the significant difficulty that she is having with walking post knee replacement 9 months ago and the cramping in the left leg, we decided to go ahead and try levodopa.  I did tell her that could make the chorea in the right leg worse.  She wants to try and we will try for 6 weeks and then I will see her back.  Risks, benefits, side effects and alternative therapies were discussed.  The opportunity to ask questions was given and they were answered to the best of my ability.  The patient expressed understanding and willingness to follow the outlined treatment protocols. 4.  F/u 6 weeks.  Much greater than 50% of this visit was spent in counseling and coordinating care.  Total face to face time:  30 min

## 2016-08-08 ENCOUNTER — Ambulatory Visit (INDEPENDENT_AMBULATORY_CARE_PROVIDER_SITE_OTHER): Payer: Medicare Other | Admitting: Family Medicine

## 2016-08-08 ENCOUNTER — Encounter: Payer: Self-pay | Admitting: Family Medicine

## 2016-08-08 VITALS — BP 126/72 | HR 66 | Temp 98.3°F | Ht 60.5 in | Wt 184.2 lb

## 2016-08-08 DIAGNOSIS — G6289 Other specified polyneuropathies: Secondary | ICD-10-CM

## 2016-08-08 DIAGNOSIS — E559 Vitamin D deficiency, unspecified: Secondary | ICD-10-CM

## 2016-08-08 DIAGNOSIS — M810 Age-related osteoporosis without current pathological fracture: Secondary | ICD-10-CM

## 2016-08-08 DIAGNOSIS — Z0000 Encounter for general adult medical examination without abnormal findings: Secondary | ICD-10-CM

## 2016-08-08 DIAGNOSIS — Z23 Encounter for immunization: Secondary | ICD-10-CM | POA: Diagnosis not present

## 2016-08-08 DIAGNOSIS — E538 Deficiency of other specified B group vitamins: Secondary | ICD-10-CM | POA: Diagnosis not present

## 2016-08-08 DIAGNOSIS — Z9181 History of falling: Secondary | ICD-10-CM

## 2016-08-08 DIAGNOSIS — E785 Hyperlipidemia, unspecified: Secondary | ICD-10-CM

## 2016-08-08 DIAGNOSIS — Z1211 Encounter for screening for malignant neoplasm of colon: Secondary | ICD-10-CM

## 2016-08-08 MED ORDER — CYANOCOBALAMIN 1000 MCG/ML IJ SOLN
1000.0000 ug | Freq: Once | INTRAMUSCULAR | Status: AC
  Administered 2016-08-08: 1000 ug via INTRAMUSCULAR

## 2016-08-08 NOTE — Patient Instructions (Addendum)
Flu shot today  Do the cologuard test kit  For chronic diarrhea -take the diarrhea medicine/ Immodium - daily and then increase frequency only when you know you are going out  Also a fiber supplement like metamucil or citrucel may also help bulk up stool  Go ahead and work on your dental issues  Let us know when you are ready to schedule your bone density test  Get calcium with D and take 1 pill twice daily  In addition to that take an extra 2000 iu of vitamin D daily Vitamin B12 shot today  Take your vitamin B12 over the counter 1000 mcg every single day! For cholesterol - cut back on carbohydrates- pasta/bread/potatoes/rice/snack foods/ and sweets

## 2016-08-08 NOTE — Progress Notes (Signed)
Subjective:    Patient ID: Julie [name], female    DOB: [birth year], 82 y.o.   MRN: [id number]  HPI Here for health maintenance exam and to review chronic medical problems    Is frustrated- doing a lot of physical therapy  Had f/u with Dr Marry Guan and Dr Tat  She was put on simemet - does not notice a difference yet  Still takes gabapentin at night  Biggest struggle is trying to walk - very fearful of falling  More stable with a walker   Saw Lesia for her AMW visit in Aug  Flu shot today   Mammogram due 11/15 normal Self breast exam  Depression score was 1-this was addressed   Has had multiple falls with injury- fall prev was addressed in detail   Wt Readings from Last 3 Encounters:  08/08/16 184 lb 4 oz (83.6 kg)  08/01/16 187 lb (84.8 kg)  07/25/16 187 lb 8 oz (85 kg)  wt is down a little  She says she " eats all the time"=not motivated to do more than that  Does PT for exercise  bmi is 35.3   dexa 8/14- she wants to hold off a little more time / due to so many doctor appts Osteoporosis  Vit D level is down at 22.4-needs to start back on calcium and D ORIF distal radial fracture 3/16 ORIF finger with bone graft 2014 Multiple falls   Colonoscopy 1/11 - adenomatous polyp (would not do it  Strong family hx of colon cancer  Has a cologuard kit - plans to do it  Last GI visit was about a year ago     Hx of B12 def Lab Results  Component Value Date   VITAMINB12 189 (L) 07/25/2016  not taking her B12 every single day    Hx of hyperlipidemia  Lab Results  Component Value Date   CHOL 195 07/25/2016   CHOL 205 (H) 09/15/2014   CHOL 211 (H) 07/06/2013   Lab Results  Component Value Date   HDL 49.10 07/25/2016   HDL 35.20 (L) 09/15/2014   HDL 47.20 07/06/2013   No results found for: [facility] Lab Results  Component Value Date   TRIG 235.0 (H) 07/25/2016   TRIG 214.0 (H) 09/15/2014   TRIG 197.0 (H) 07/06/2013   Lab Results  Component Value Date   CHOLHDL 4 07/25/2016   CHOLHDL 6 09/15/2014   CHOLHDL 4 07/06/2013   Lab Results  Component Value Date   LDLDIRECT 109.0 07/25/2016   LDLDIRECT 123.4 09/15/2014   LDLDIRECT 134.7 07/06/2013   Cholesterol is improved - but needs to cut back on carbs for triglyderides   Review of Systems Review of Systems  Constitutional: Negative for fever, appetite change, fatigue and unexpected weight change.  Eyes: Negative for pain and visual disturbance.  ENT pos for dental problems - has dental implants that are loose  Respiratory: Negative for cough and shortness of breath.   Cardiovascular: Negative for cp or palpitations    Gastrointestinal: Negative for nausea, and constipation. - still having problems with chronic diarrhea  Genitourinary: Negative for urgency and frequency. pt has c/o of an odor about her- not a urine smell -cannot describe it (like ammonia) Skin: Negative for pallor or rash   Neurological: Negative for weakness, light-headedness, numbness and headaches.  Hematological: Negative for adenopathy. Does not bruise/bleed easily.  Psychiatric/Behavioral: Negative for dysphoric mood. The patient is not nervous/anxious.         Objective:  Physical Exam  Constitutional: She appears well-developed and well-nourished. No distress.  obese and well appearing - elderly female sitting in chair of her walker   HENT:  Head: Normocephalic and atraumatic.  Right Ear: External ear normal.  Left Ear: External ear normal.  Mouth/Throat: Oropharynx is clear and moist.  Eyes: Conjunctivae and EOM are normal. Pupils are equal, round, and reactive to light. No scleral icterus.  Neck: Normal range of motion. Neck supple. No JVD present. Carotid bruit is not present. No thyromegaly present.  Cardiovascular: Normal rate, regular rhythm, normal heart sounds and intact distal pulses.  Exam reveals no gallop.   No murmur heard. Pulmonary/Chest: Effort normal and breath sounds normal. No  respiratory distress. She has no wheezes. She exhibits no tenderness.  Abdominal: Soft. Bowel sounds are normal. She exhibits no distension, no abdominal bruit and no mass. There is no tenderness.  Genitourinary: No breast swelling, tenderness, discharge or bleeding.  Genitourinary Comments: Breast exam: No mass, nodules, thickening, tenderness, bulging, retraction, inflamation, nipple discharge or skin changes noted.  No axillary or clavicular LA.     Exam done sitting in chair -unable to get on table  Musculoskeletal: Normal range of motion. She exhibits no edema or tenderness.  Lymphadenopathy:    She has no cervical adenopathy.  Neurological: She is alert. She has normal reflexes. No cranial nerve deficit. She exhibits normal muscle tone. Coordination normal.  Skin: Skin is warm and dry. No rash noted. No erythema. No pallor.  sks and angiomas diffusely  Psychiatric: Her mood appears not anxious. She does not exhibit a depressed mood.          Assessment & Plan:   Problem List Items Addressed This Visit      Digestive   B12 deficiency    Lab Results  Component Value Date   VITAMINB12 189 (L) 07/25/2016   She will get a B12 shot today Then resume the 1000 mcg orally daily she was supposed to be on  Hope it will help energy level Continue to follow       Relevant Medications   cyanocobalamin ((VITAMIN B-12)) injection 1,000 mcg (Completed)     Nervous and Auditory   Peripheral neuropathy (HCC)    Pt continues gabapentin and neuro f/u  No change in clinical symptoms         Musculoskeletal and Integument   Osteoporosis    Due for dexa- pt will call back to schedule due to busy schedule with PT Multiple falls and fractures  Has been slack on ca and D Hesitant about treatment          Other   Vitamin D deficiency    Level is in 16s- she had stopped her ca and D inst to get back on ca plus D bid and extra D daily -2000iu Disc imp to bone and overall health       Routine general medical examination at a health care facility    Reviewed health habits including diet and exercise and skin cancer prevention Reviewed appropriate screening tests for age  Also reviewed health mt list, fam hx and immunization status , as well as social and family history   See HPI AMW reviewed  Labs reviewed Flu shot today  Do the cologuard test kit  For chronic diarrhea -take the diarrhea medicine/ Immodium - daily and then increase frequency only when you know you are going out  Also a fiber supplement like metamucil or citrucel may also help  bulk up stool  Go ahead and work on your dental issues  Let us know when you are ready to schedule your bone density test  Get calcium with D and take 1 pill twice daily  In addition to that take an extra 2000 iu of vitamin D daily Vitamin B12 shot today  Take your vitamin B12 over the counter 1000 mcg every single day! For cholesterol - cut back on carbohydrates- pasta/bread/potatoes/rice/snack foods/ and sweets       Risk for falls    Again rev fall prev in and out of the home  Using walker with seat at all times       Hyperlipidemia    Disc goals for lipids and reasons to control them Rev labs with pt Rev low sat fat diet in detail       Colon cancer screening    She will do the cologuard kit  GI wants to avoid colnoscopy due to age- but given fam hx she would like to do one if her test is positive No changes in chronic diarrhea        Other Visit Diagnoses    Need for influenza vaccination    -  Primary   Relevant Orders   Flu Vaccine QUAD 36+ mos IM (Completed)

## 2016-08-08 NOTE — Progress Notes (Signed)
Pre visit review using our clinic review tool, if applicable. No additional management support is needed unless otherwise documented below in the visit note. 

## 2016-08-09 NOTE — Assessment & Plan Note (Signed)
Again rev fall prev in and out of the home  Using walker with seat at all times

## 2016-08-09 NOTE — Assessment & Plan Note (Signed)
Pt continues gabapentin and neuro f/u  No change in clinical symptoms

## 2016-08-09 NOTE — Assessment & Plan Note (Signed)
Reviewed health habits including diet and exercise and skin cancer prevention Reviewed appropriate screening tests for age  Also reviewed health mt list, fam hx and immunization status , as well as social and family history   See HPI AMW reviewed  Labs reviewed Flu shot today  Do the cologuard test kit  For chronic diarrhea -take the diarrhea medicine/ Immodium - daily and then increase frequency only when you know you are going out  Also a fiber supplement like metamucil or citrucel may also help bulk up stool  Go ahead and work on your dental issues  Let us know when you are ready to schedule your bone density test  Get calcium with D and take 1 pill twice daily  In addition to that take an extra 2000 iu of vitamin D daily Vitamin B12 shot today  Take your vitamin B12 over the counter 1000 mcg every single day! For cholesterol - cut back on carbohydrates- pasta/bread/potatoes/rice/snack foods/ and sweets

## 2016-08-09 NOTE — Assessment & Plan Note (Signed)
Disc goals for lipids and reasons to control them Rev labs with pt Rev low sat fat diet in detail   

## 2016-08-09 NOTE — Assessment & Plan Note (Signed)
Level is in 57s- she had stopped her ca and D inst to get back on ca plus D bid and extra D daily -2000iu Disc imp to bone and overall health

## 2016-08-09 NOTE — Assessment & Plan Note (Signed)
Lab Results  Component Value Date   VITAMINB12 189 (L) 07/25/2016   She will get a B12 shot today Then resume the 1000 mcg orally daily she was supposed to be on  Hope it will help energy level Continue to follow

## 2016-08-09 NOTE — Assessment & Plan Note (Signed)
Due for dexa- pt will call back to schedule due to busy schedule with PT Multiple falls and fractures  Has been slack on ca and D Hesitant about treatment

## 2016-08-09 NOTE — Assessment & Plan Note (Signed)
She will do the cologuard kit  GI wants to avoid colnoscopy due to age- but given fam hx she would like to do one if her test is positive No changes in chronic diarrhea

## 2016-09-09 DIAGNOSIS — H53462 Homonymous bilateral field defects, left side: Secondary | ICD-10-CM | POA: Diagnosis not present

## 2016-09-09 DIAGNOSIS — Z961 Presence of intraocular lens: Secondary | ICD-10-CM | POA: Diagnosis not present

## 2016-09-13 NOTE — Progress Notes (Signed)
Julie [name] was seen today in neurologic consultation at the request of [name], MD.  The consultation is for the evaluation of falls and gait instability.  The records that were made available to me were reviewed.  No one accompanies her back to the room.  Pt states that she has had balance issues for "about a year," although she saw Dr. [name] for the same in 2012.  She states that she is having a lot of falls with no warning beforehand.  She doesn't feel dizzy or lightheaded.  Just about 4-6 weeks ago, she broke her wrist and had to have surgery.  Then a week ago, she fell over the lift chair and she had to have staples in the head.  She estimates that she has a fall a few times a month.  She has used a walker for a few years; she has never had a fall when she was actually using her walker.  Her legs don't feel weak.  No swallowing trouble.  States that her speech has not changed, but her friends all state that she talks "too low."  She has no tremor.  No urinary incontinence but may rarely have an accident.  She denies paresthesias of the feet.  Her sister is 81 years older than she is and she has had some falls as well, but not as frequent as the pt (and states that her sister is blind from macular degeneration).   She denies any abnormal movements, but then says "I think I must be nervous in the hands as they move all the time."  She states that her husband has had to help her dress since 2014, when she broke her left wrist, and she can no longer hook her bra.   She has not driven since aneurysm surgery in 2001 because of peripheral vision loss.    Pt has seen Dr. [name] in 09/2011 for the same and I reviewed his note (only one note I have).  He ordered an MRI of the brain was completed on 10/06/2011.  I only have the report.  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  She apparently had an EMG done of the lower extremities that was reported to be unremarkable.  She has been seen at [facility] for her aneurysm and that is where it was clipped in 2001.  She states that she did well after surgery but did lose peripheral vision on the L after surgery.    05/15/15 update:  Pt seen for f/u.  Has had multiple tests done.  EMG demonstrated moderate PN.  MRI brain done at [facility] demonstrating artifact secondary to prior aneurysm clipping and moderate small vessel disease.  She had multiple lab tests done.  Her B12 was a bit low at 262 and she was told to start an oral supplement.  She had other testing including copper, ceruloplasmin, ESR (19), ANA, ferritin, rpr (neg), cpk and aldolase and all were normal.  Celiac panel was normal.  She saw Charlann Boxer and just had a custom orthotic made for mild foot drop.  She has had no falls since last visit.  Is using her walker most of the time.  She does not notice the movements of the right leg.  06/05/15 update:  The patient follows up today unexpectedly.  Last visit, I had given her a form to  have lab work done for chorea of the right leg.  This was an Hydrologist.  She talked to [place], received the packet and her copay was $250.  She googled HD and wanted to talk further.  States that if there is no cure, she really wants to go no further with testing.  Plans to go to [place] in [place] with church and worries about falls/getting on bus.  08/01/16 update:  Pt f/u today at the urging of her ortho surgeon.  I got records from Dr. Sunny Schlein stating that the patient had a L TKA and he told the pt she needed f/u with me.  Pt states that she is so hesitant now that she cannot get by without her walker.  She is having significant cramping of the toes on the left and of the gastroc, mostly at night but during the day as well.    09/16/16 update:  Pt f/u today.  Has R leg chorea.  Having a lot of cramping and difficulty  walking with L leg post TKA and because of very mild parkinsonism, decided to try levodopa for a few weeks and see how she did with the med.  States that she is only taking one levodopa per day as she "has so much medication to take."  "my toes are still drawing up."  She never tried 3 tablets a day.    Dr. Milinda Antis checked her B12 in august and it was low at 189.  Pt was to be on replacement but hadn't been.  Now on injections.  ALLERGIES:   Allergies  Allergen Reactions  . Codeine     REACTION: headache and nausea and vomiting    CURRENT MEDICATIONS:  Outpatient Encounter Prescriptions as of 09/16/2016  Medication Sig  . acetaminophen (TYLENOL) 500 MG tablet Take 500 mg by mouth every 6 (six) hours as needed.  . Alum Hydroxide-Mag Carbonate (GAVISCON EXTRA STRENGTH) 160-105 MG CHEW Chew 1 tablet by mouth as needed (acid reflux).  . carbidopa-levodopa (SINEMET IR) 25-100 MG tablet Take 1 tablet by mouth 3 (three) times daily. (Patient taking differently: Take 1 tablet by mouth daily. )  . cyanocobalamin 1000 MCG tablet Take 1,000 mcg by mouth daily. In am  . gabapentin (NEURONTIN) 300 MG capsule Take 1 capsule (300 mg total) by mouth at bedtime.  Marland Kitchen loperamide (IMODIUM) 2 MG capsule Take by mouth as needed for diarrhea or loose stools.   No facility-administered encounter medications on file as of 09/16/2016.     PAST MEDICAL HISTORY:   Past Medical History:  Diagnosis Date  . Adenoma   . Chronic leg pain   . Colon polyp   . DDD (degenerative disc disease), cervical   . Diverticulosis of colon    internal----Dr. Mechele Collin  . Duodenitis   . Esophagitis   . GERD (gastroesophageal reflux disease)   . Hemorrhoids   . HLD (hyperlipidemia)   . IBS (irritable bowel syndrome)    with PP diarrhea  . Neuropathy (HCC)   . Neuropathy (HCC)   . OA (osteoarthritis) of knee   . Osteopenia   . PONV (postoperative nausea and vomiting)   . Scoliosis   . Seborrheic dermatitis   . Shortness of  breath dyspnea    with activity  . TMJ syndrome   . Trochanteric bursitis   . Unsteady gait    FALLS EASILY    PAST SURGICAL HISTORY:   Past Surgical History:  Procedure Laterality Date  . CARPAL  TUNNEL RELEASE Left 01/18/2013   Procedure: CARPAL TUNNEL RELEASE;  Surgeon: Wynonia Sours, MD;  Location: [place];  Service: Orthopedics;  Laterality: Left;  OSTEOTOMY LEFT DISTAL RADIUS BONE CHIPS CARPAL TUNNEL RELEASE LEFT   . CATARACT EXTRACTION W/PHACO Right 08/07/2015   Procedure: CATARACT EXTRACTION PHACO AND INTRAOCULAR LENS PLACEMENT (IOC);  Surgeon: Birder Robson, MD;  Location: [facility];  Service: Ophthalmology;  Laterality: Right;  [place] 00:48.0 AP   22.3 CDE 10.69 casette lot # [id number]  . CATARACT EXTRACTION W/PHACO Left 09/04/2015   Procedure: CATARACT EXTRACTION PHACO AND INTRAOCULAR LENS PLACEMENT (IOC);  Surgeon: Birder Robson, MD;  Location: [facility];  Service: Ophthalmology;  Laterality: Left;  [place] AP CDE FLUID LOT # [id number]  . CEREBRAL ANEURYSM REPAIR  2001   clamps  . COLONOSCOPY  1/11   polyps-hyperplastic and adenomatous  . FOOT SURGERY  2010   bilateral hammer toes and "knot"  . FRACTURE SURGERY Right 01/2014   result of a fall  . FRACTURE SURGERY Left 03/2014   result of a fall  . HEMORRHOID SURGERY    . KNEE ARTHROPLASTY Left 10/15/2015   Procedure: COMPUTER ASSISTED TOTAL KNEE ARTHROPLASTY;  Surgeon: Dereck Leep, MD;  Location: [facility];  Service: Orthopedics;  Laterality: Left;  . OPEN REDUCTION INTERNAL FIXATION (ORIF) DISTAL RADIAL FRACTURE Right 02/13/2015   Procedure: OPEN REDUCTION INTERNAL FIXATION (ORIF) RIGHT DISTAL RADIUS;  Surgeon: Daryll Brod, MD;  Location: [place];  Service: Orthopedics;  Laterality: Right;  . OPEN REDUCTION INTERNAL FIXATION (ORIF) FINGER WITH RADIAL BONE GRAFT Left 11/01/2013   Procedure: OPEN REDUCTION INTERNAL FIXATION (ORIF) LEFT SMALL FINGER;  Surgeon: Wynonia Sours, MD;  Location: [place];  Service: Orthopedics;  Laterality: Left;  . TOTAL ABDOMINAL HYSTERECTOMY    . WRIST OSTEOTOMY Left 01/18/2013   Procedure: WRIST OSTEOTOMY;  Surgeon: Wynonia Sours, MD;  Location: [place];  Service: Orthopedics;  Laterality: Left;    SOCIAL HISTORY:   Social History   Social History  . Marital status: Married    Spouse name: N/A  . Number of children: N/A  . Years of education: N/A   Occupational History  . Retired     Radiation protection practitioner   Social History Main Topics  . Smoking status: Former Smoker    Quit date: 10/02/1954  . Smokeless tobacco: Never Used  . Alcohol use No  . Drug use: No  . Sexual activity: No     Comment: smoked alittle as teen   Other Topics Concern  . Not on file   Social History Narrative   Retired      Married      Regular exercise          FAMILY HISTORY:   Family Status  Relation Status  . Mother Deceased   colon cancer  . Father Deceased   complications of ?cataract surgery  . Brother Deceased   3, colon CA, COPD  . Sister Deceased   ? CAD  . Sister Alive   2, macular degeneration,   . Child Alive   2, healthy  .    Marland Kitchen Brother   . Brother   . Brother   . Brother   . Maternal Aunt     ROS:  A complete 10 system review of systems was obtained and was unremarkable apart from what is mentioned above.  PHYSICAL EXAMINATION:    VITALS:   Vitals:   09/16/16  1320  BP: 130/70  Pulse: 65  Weight: 188 lb (85.3 kg)  Height: '5\' 3"'$  (1.6 m)     GEN:  Normal appears female in no acute distress.  Appears stated age. CV:  RRR Lungs:  CTAB Neck: no bruits   NEUROLOGICAL: Orientation:  The patient is alert and oriented x 3.  Cranial nerves: There is good facial symmetry. . Extraocular muscles are intact and there is a left homonymous hemianopsia.   Speech is fluent and clear.   Soft palate rises symmetrically and there is no tongue deviation. Hearing is intact to conversational tone. Tone: Tone is  good throughout. Coordination:  The patient has some trouble with finger taps on the L.   Gait and Station: The patient ambulates with a walker.  She has her foot brace on and does well with this.   Abnormal movements: No tremor.  She does have Minimal choreiform-like movement of the right leg that she does not notice (more noticeable on previous visits).  There is no curling of the toes today (but states that if she walks without shoes it happens quickly).  IMPRESSION/PLAN  1.  Peripheral neuropathy  -Is moderate in nature and certainly could explain her falls.  She and I again discussed the nature of peripheral neuropathy.  She has a mild B12 deficiency and is currently on oral replacement.    -admits that she thinks that much of her inability to ambulate without a walker is related to anxiety about falling.  I told her that she has every right to be anxious about walking without a walker, and should be using the walker at all times. 2.  R leg chorea  -suspect ischemic.  MRI with only small vessel disease but significant artifact on it from prior aneurysm clipping.  She refuses further workup for chorea.  Was very minimal today. 3.  Mild parkinsonism  -I do not suspect Parkinson's disease in her.  She did not want to take levodopa 3 times per day to see if that helped the cramping.  She tried it one time per day and it did not help, which is not surprising.  I talked to her about the DaT scan, although I think the yield is low.  She opted to hold on that. 4.  B12 deficiency  -now on injections.  Suspicion for subacute combined degeneration is low but could do MRI cervical spine given walking issues.  Pt Opted to hold. 5.  F/u as needed.

## 2016-09-16 ENCOUNTER — Encounter: Payer: Self-pay | Admitting: Neurology

## 2016-09-16 ENCOUNTER — Ambulatory Visit (INDEPENDENT_AMBULATORY_CARE_PROVIDER_SITE_OTHER): Payer: Medicare Other | Admitting: Neurology

## 2016-09-16 VITALS — BP 130/70 | HR 65 | Ht 63.0 in | Wt 188.0 lb

## 2016-09-16 DIAGNOSIS — G20 Parkinson's disease: Secondary | ICD-10-CM

## 2016-09-16 DIAGNOSIS — E538 Deficiency of other specified B group vitamins: Secondary | ICD-10-CM

## 2016-09-16 DIAGNOSIS — G255 Other chorea: Secondary | ICD-10-CM | POA: Diagnosis not present

## 2016-09-24 DIAGNOSIS — Z1212 Encounter for screening for malignant neoplasm of rectum: Secondary | ICD-10-CM | POA: Diagnosis not present

## 2016-09-24 DIAGNOSIS — Z1211 Encounter for screening for malignant neoplasm of colon: Secondary | ICD-10-CM | POA: Diagnosis not present

## 2016-09-24 LAB — COLOGUARD: Cologuard: NEGATIVE

## 2016-10-16 ENCOUNTER — Encounter: Payer: Self-pay | Admitting: *Deleted

## 2016-10-25 ENCOUNTER — Other Ambulatory Visit: Payer: Self-pay | Admitting: Neurology

## 2016-10-27 NOTE — Telephone Encounter (Signed)
Refill sent. Last OV note states to continue.

## 2016-10-28 ENCOUNTER — Telehealth: Payer: Self-pay | Admitting: *Deleted

## 2016-10-28 DIAGNOSIS — Z96652 Presence of left artificial knee joint: Secondary | ICD-10-CM | POA: Diagnosis not present

## 2016-10-28 NOTE — Telephone Encounter (Signed)
I would recommend trial of an antihistamine otc (like claritin or allegra or zyrtec) -not the D variety however Take as directed for rhinorrhea and pnd   F/u if worse or no imp

## 2016-10-28 NOTE — Telephone Encounter (Signed)
Pt notified of Dr. Marliss Coots comments and verbalized understanding she will try one of the OTC meds and f/u if no improvement

## 2016-10-28 NOTE — Telephone Encounter (Signed)
Patient called stating that her nose is running and she has a lot of post nasal drip that is causing her to cough. Patient stated that she can not come in for an appointment and wants to know what you recommend that she take for this?

## 2016-11-05 DIAGNOSIS — B351 Tinea unguium: Secondary | ICD-10-CM | POA: Diagnosis not present

## 2016-11-05 DIAGNOSIS — M2042 Other hammer toe(s) (acquired), left foot: Secondary | ICD-10-CM | POA: Diagnosis not present

## 2016-11-05 DIAGNOSIS — M79672 Pain in left foot: Secondary | ICD-10-CM | POA: Diagnosis not present

## 2016-12-05 DIAGNOSIS — M79672 Pain in left foot: Secondary | ICD-10-CM | POA: Diagnosis not present

## 2016-12-05 DIAGNOSIS — B351 Tinea unguium: Secondary | ICD-10-CM | POA: Diagnosis not present

## 2016-12-05 DIAGNOSIS — M79671 Pain in right foot: Secondary | ICD-10-CM | POA: Diagnosis not present

## 2017-01-05 IMAGING — CR DG KNEE 1-2V PORT*L*
1 series · 2 of 2 positions shown · non-contrast
Comparison: 09/29/2014

CLINICAL DATA: Post op knee -left

EXAM:
PORTABLE LEFT KNEE - 1-2 VIEW

[Series 1: ap · 0.17mm/px · 2 of 2 slices shown]
[im 1/2]
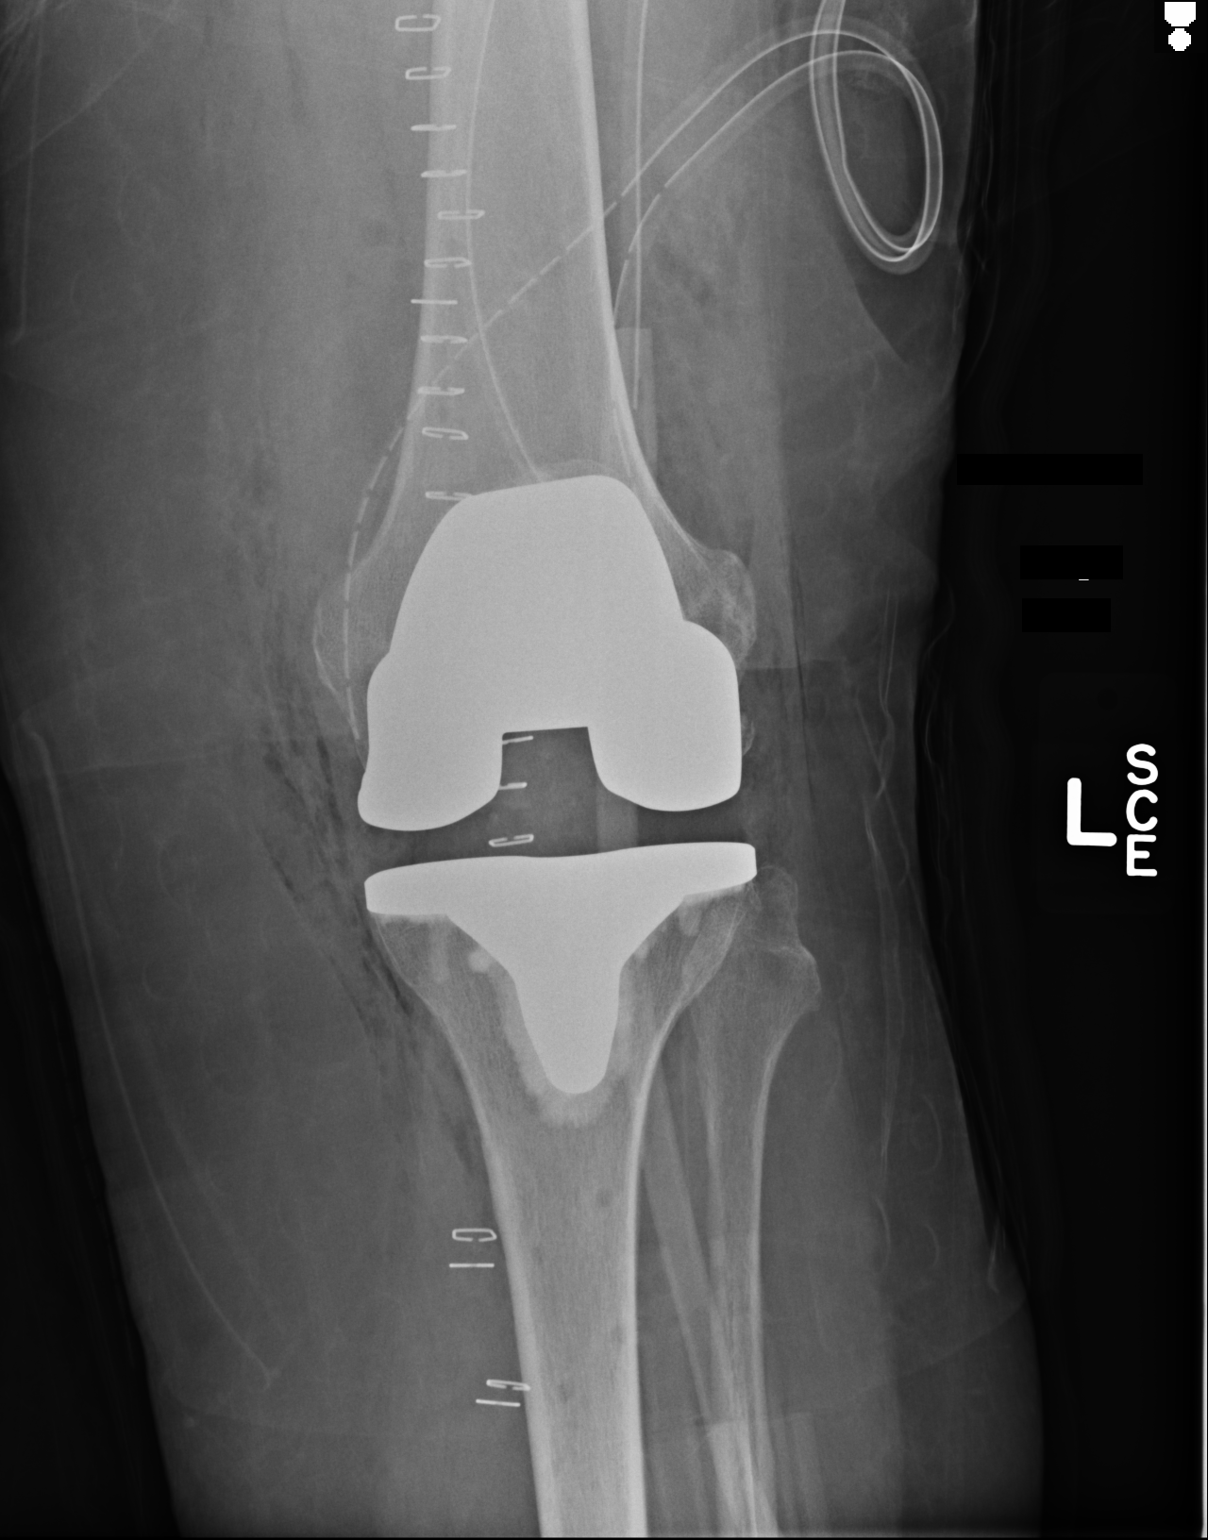
[im 2/2]
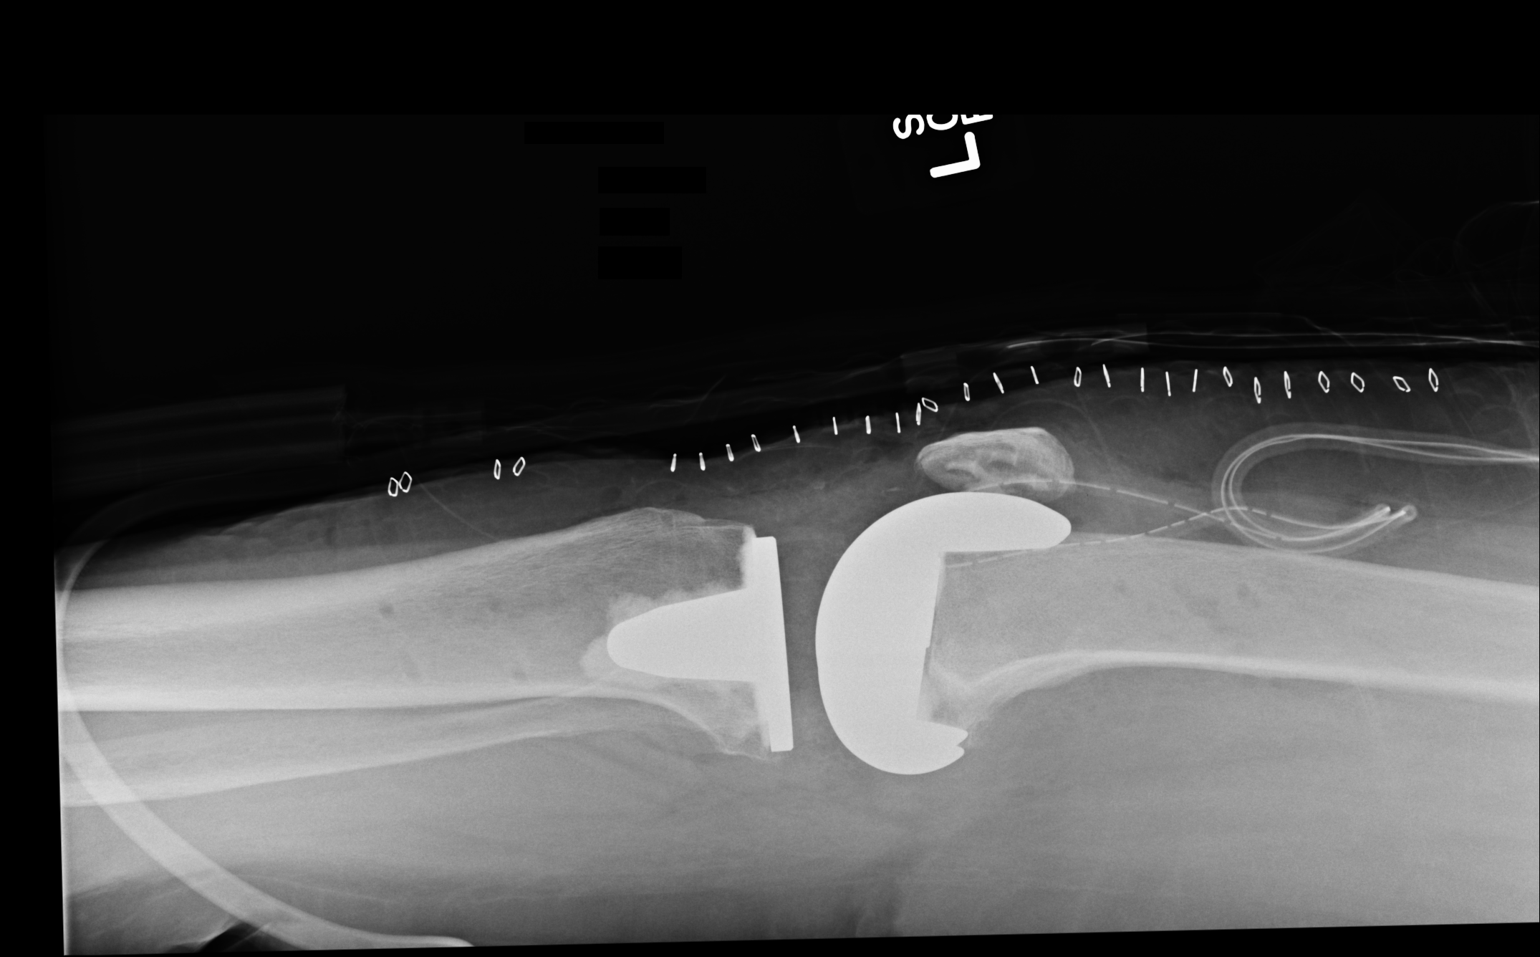

[2 of 2 positions shown; findings below may reference images not displayed]

FINDINGS: Left knee arthroplasty. No acute hardware complication. Surgical
drain, gas, and staples project about the anterior knee.
IMPRESSION: Expected appearance after left knee arthroplasty.

## 2017-01-25 ENCOUNTER — Telehealth: Payer: Self-pay | Admitting: Family Medicine

## 2017-01-25 DIAGNOSIS — E559 Vitamin D deficiency, unspecified: Secondary | ICD-10-CM

## 2017-01-25 DIAGNOSIS — E538 Deficiency of other specified B group vitamins: Secondary | ICD-10-CM

## 2017-01-25 DIAGNOSIS — E7800 Pure hypercholesterolemia, unspecified: Secondary | ICD-10-CM

## 2017-01-25 NOTE — Telephone Encounter (Signed)
-----   Message from Ellamae Sia sent at 01/23/2017  3:31 PM EST ----- Regarding: Lab orders for Tuesday, 3.6.18  Lab orders for a f/u

## 2017-01-29 ENCOUNTER — Other Ambulatory Visit: Payer: Self-pay | Admitting: Family Medicine

## 2017-01-29 NOTE — Telephone Encounter (Signed)
Pt has f/u on 02/06/17, last filled on 02/29/16 #30 caps with 10 additional refills, please advise

## 2017-01-29 NOTE — Telephone Encounter (Signed)
Please refill for 6 months 

## 2017-01-29 NOTE — Telephone Encounter (Signed)
done

## 2017-02-03 ENCOUNTER — Encounter (INDEPENDENT_AMBULATORY_CARE_PROVIDER_SITE_OTHER): Payer: Self-pay

## 2017-02-03 ENCOUNTER — Other Ambulatory Visit (INDEPENDENT_AMBULATORY_CARE_PROVIDER_SITE_OTHER): Payer: Medicare Other

## 2017-02-03 DIAGNOSIS — R7989 Other specified abnormal findings of blood chemistry: Secondary | ICD-10-CM | POA: Diagnosis not present

## 2017-02-03 DIAGNOSIS — E538 Deficiency of other specified B group vitamins: Secondary | ICD-10-CM | POA: Diagnosis not present

## 2017-02-03 DIAGNOSIS — E559 Vitamin D deficiency, unspecified: Secondary | ICD-10-CM | POA: Diagnosis not present

## 2017-02-03 DIAGNOSIS — E7800 Pure hypercholesterolemia, unspecified: Secondary | ICD-10-CM | POA: Diagnosis not present

## 2017-02-03 LAB — LIPID PANEL
Cholesterol: 188 mg/dL (ref 0–200)
HDL: 43.4 mg/dL (ref >39.00)
NonHDL: 145.06
Total CHOL/HDL Ratio: 4
Triglycerides: 257 mg/dL — ABNORMAL HIGH (ref 0.0–149.0)
VLDL: 51.4 mg/dL — ABNORMAL HIGH (ref 0.0–40.0)

## 2017-02-03 LAB — VITAMIN B12: Vitamin B-12: 1176 pg/mL — ABNORMAL HIGH (ref 211–911)

## 2017-02-03 LAB — LDL CHOLESTEROL, DIRECT: Direct LDL: 107 mg/dL

## 2017-02-03 LAB — VITAMIN D 25 HYDROXY (VIT D DEFICIENCY, FRACTURES): VITD: 29.87 ng/mL — ABNORMAL LOW (ref 30.00–100.00)

## 2017-02-06 ENCOUNTER — Encounter: Payer: Self-pay | Admitting: Family Medicine

## 2017-02-06 ENCOUNTER — Ambulatory Visit (INDEPENDENT_AMBULATORY_CARE_PROVIDER_SITE_OTHER): Payer: Medicare Other | Admitting: Family Medicine

## 2017-02-06 VITALS — BP 122/76 | HR 83 | Temp 98.2°F | Ht 63.0 in | Wt 185.2 lb

## 2017-02-06 DIAGNOSIS — M810 Age-related osteoporosis without current pathological fracture: Secondary | ICD-10-CM

## 2017-02-06 DIAGNOSIS — E538 Deficiency of other specified B group vitamins: Secondary | ICD-10-CM | POA: Diagnosis not present

## 2017-02-06 DIAGNOSIS — E559 Vitamin D deficiency, unspecified: Secondary | ICD-10-CM

## 2017-02-06 DIAGNOSIS — M353 Polymyalgia rheumatica: Secondary | ICD-10-CM

## 2017-02-06 DIAGNOSIS — E781 Pure hyperglyceridemia: Secondary | ICD-10-CM

## 2017-02-06 DIAGNOSIS — G6289 Other specified polyneuropathies: Secondary | ICD-10-CM | POA: Diagnosis not present

## 2017-02-06 NOTE — Progress Notes (Signed)
Pre visit review using our clinic review tool, if applicable. No additional management support is needed unless otherwise documented below in the visit note. 

## 2017-02-06 NOTE — Patient Instructions (Addendum)
Take your B12 pill every other day Try to eat a balanced diet  Your vitamin D is still low so increase your supplement to 4000 iu daily  Let us know when you are ready to schedule your bone density test   To control weight and prevent diabetes and help cholesterol  Try to eat more vegetables Instead of sweets- substitute some fruits (they are better for you)  Stay away from sugar drinks - please cut back on coke and drink water instead   Follow up in 6 months for annual exam   Take care of yourself !

## 2017-02-06 NOTE — Progress Notes (Signed)
Subjective:    Patient ID: Julie [name], female    DOB: 08-16-1932, 83 y.o.   MRN: [id number]  HPI Here for f/u of chronic health problems   Feeling fair overall  Still getting some dental work done    [employer] from Last 3 Encounters:  02/06/17 185 lb 4 oz (84 kg)  09/16/16 188 lb (85.3 kg)  08/08/16 184 lb 4 oz (83.6 kg)  down 3 lb - she is surprised  bmi 32.8  Last visit was tx for B12 def with a shot and then adv to take 1000 mcg daily po Lab Results  Component Value Date   VITAMINB12 1,176 (H) 02/03/2017   Watching cholesterol Lab Results  Component Value Date   CHOL 188 02/03/2017   CHOL 195 07/25/2016   CHOL 205 (H) 09/15/2014   Lab Results  Component Value Date   HDL 43.40 02/03/2017   HDL 49.10 07/25/2016   HDL 35.20 (L) 09/15/2014   No results found for: [facility] Lab Results  Component Value Date   TRIG 257.0 (H) 02/03/2017   TRIG 235.0 (H) 07/25/2016   TRIG 214.0 (H) 09/15/2014   Lab Results  Component Value Date   CHOLHDL 4 02/03/2017   CHOLHDL 4 07/25/2016   CHOLHDL 6 09/15/2014   Lab Results  Component Value Date   LDLDIRECT 107.0 02/03/2017   LDLDIRECT 109.0 07/25/2016   LDLDIRECT 123.4 09/15/2014  eating too much sugar/sweets and carbs  Loves her sweets  She eats candy and ice cream and cookies   Not a big carb eater otherwise   Also low D Supplementing  D level is 29.8- up from 22.4   Still using walker Has chorea Also neuropathy  Taking gabapentin and simemet    Patient Active Problem List   Diagnosis Date Noted  . Routine general medical examination at a health care facility 08/08/2016  . Left knee pain 02/01/2016  . Total knee replacement status 10/15/2015  . B12 deficiency 07/16/2015  . Peripheral neuropathy ([place]) 07/16/2015  . 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 Observation after surgery 02/13/2015  . Phalanx fracture, foot 01/10/2015  . Colon cancer screening 09/22/2014  . Osteoarthritis of left lower extremity 09/09/2014   . History of falling 04/26/2014  . Poor balance 04/26/2014  . Left-sided weakness 04/26/2014  . PMR (polymyalgia rheumatica) (HCC) 10/11/2013  . Encounter for Medicare annual wellness exam 07/13/2013  . Risk for falls 07/13/2013  . Skin tag of labia 03/14/2013  . Back pain, thoracic 08/07/2011  . Weakness of left leg 07/30/2011  . BACK PAIN, LUMBAR 12/10/2010  . Vitamin D deficiency 08/16/2010  . GERD 03/26/2010  . DYSPEPSIA 03/26/2010  . IRRITABLE BOWEL SYNDROME 03/26/2010  . Osteoporosis 08/27/2009  . Hypertriglyceridemia 07/24/2009  . DIVERTICULOSIS, COLON 07/24/2009  . OSTEOARTHRITIS 07/24/2009  . DIARRHEA, PERSISTENT 07/24/2009  . COLONIC POLYPS, HX OF 07/24/2009  . POSTMENOPAUSAL STATUS 07/24/2009   Past Medical History:  Diagnosis Date  . Adenoma   . Chronic leg pain   . Colon polyp   . DDD (degenerative disc disease), cervical   . Diverticulosis of colon    internal----Dr. Vira Agar  . Duodenitis   . Esophagitis   . GERD (gastroesophageal reflux disease)   . Hemorrhoids   . HLD (hyperlipidemia)   . IBS (irritable bowel syndrome)    with PP diarrhea  . Neuropathy ([place])   . Neuropathy ([place])   . OA (osteoarthritis) of knee   . Osteopenia   . PONV (postoperative nausea  and vomiting)   . Scoliosis   . Seborrheic dermatitis   . Shortness of breath dyspnea    with activity  . TMJ syndrome   . Trochanteric bursitis   . Unsteady gait    FALLS EASILY   Past Surgical History:  Procedure Laterality Date  . CARPAL TUNNEL RELEASE Left 01/18/2013   Procedure: CARPAL TUNNEL RELEASE;  Surgeon: Wynonia Sours, MD;  Location: [place];  Service: Orthopedics;  Laterality: Left;  OSTEOTOMY LEFT DISTAL RADIUS BONE CHIPS CARPAL TUNNEL RELEASE LEFT   . CATARACT EXTRACTION W/PHACO Right 08/07/2015   Procedure: CATARACT EXTRACTION PHACO AND INTRAOCULAR LENS PLACEMENT (IOC);  Surgeon: Birder Robson, MD;  Location: [facility];  Service: Ophthalmology;  Laterality:  Right;  [place] 00:48.0 AP   22.3 CDE 10.69 casette lot # [id number]  . CATARACT EXTRACTION W/PHACO Left 09/04/2015   Procedure: CATARACT EXTRACTION PHACO AND INTRAOCULAR LENS PLACEMENT (IOC);  Surgeon: Birder Robson, MD;  Location: [facility];  Service: Ophthalmology;  Laterality: Left;  [place] AP CDE FLUID LOT # [id number]  . CEREBRAL ANEURYSM REPAIR  2001   clamps  . COLONOSCOPY  1/11   polyps-hyperplastic and adenomatous  . FOOT SURGERY  2010   bilateral hammer toes and "knot"  . FRACTURE SURGERY Right 01/2014   result of a fall  . FRACTURE SURGERY Left 03/2014   result of a fall  . HEMORRHOID SURGERY    . KNEE ARTHROPLASTY Left 10/15/2015   Procedure: COMPUTER ASSISTED TOTAL KNEE ARTHROPLASTY;  Surgeon: Dereck Leep, MD;  Location: [facility];  Service: Orthopedics;  Laterality: Left;  . OPEN REDUCTION INTERNAL FIXATION (ORIF) DISTAL RADIAL FRACTURE Right 02/13/2015   Procedure: OPEN REDUCTION INTERNAL FIXATION (ORIF) RIGHT DISTAL RADIUS;  Surgeon: Daryll Brod, MD;  Location: [place];  Service: Orthopedics;  Laterality: Right;  . OPEN REDUCTION INTERNAL FIXATION (ORIF) FINGER WITH RADIAL BONE GRAFT Left 11/01/2013   Procedure: OPEN REDUCTION INTERNAL FIXATION (ORIF) LEFT SMALL FINGER;  Surgeon: Wynonia Sours, MD;  Location: [place];  Service: Orthopedics;  Laterality: Left;  . TOTAL ABDOMINAL HYSTERECTOMY    . WRIST OSTEOTOMY Left 01/18/2013   Procedure: WRIST OSTEOTOMY;  Surgeon: Wynonia Sours, MD;  Location: [place];  Service: Orthopedics;  Laterality: Left;   Social History  Substance Use Topics  . Smoking status: Former Smoker    Quit date: 10/02/1954  . Smokeless tobacco: Never Used  . Alcohol use No   Family History  Problem Relation Age of Onset  . Colon cancer Mother   . Osteoporosis      hip fracture  . Stroke Brother   . Colon cancer Brother   . Hypertension Brother   . Other Brother     Heart problem  . Colon cancer  Brother   . Colon cancer Maternal Aunt    Allergies  Allergen Reactions  . Codeine     REACTION: headache and nausea and vomiting   Current Outpatient Prescriptions on File Prior to Visit  Medication Sig Dispense Refill  . acetaminophen (TYLENOL) 500 MG tablet Take 500 mg by mouth every 6 (six) hours as needed.    . Alum Hydroxide-Mag Carbonate (GAVISCON EXTRA STRENGTH) 160-105 MG CHEW Chew 1 tablet by mouth as needed (acid reflux).    . carbidopa-levodopa (SINEMET IR) 25-100 MG tablet TAKE 1 TABLET BY MOUTH 3 (THREE) TIMES DAILY. 90 tablet 2  . cyanocobalamin 1000 MCG tablet Take 1,000 mcg by mouth daily. In  am    . gabapentin (NEURONTIN) 300 MG capsule TAKE 1 CAPSULE (300 MG TOTAL) BY MOUTH AT BEDTIME. 30 capsule 5  . loperamide (IMODIUM) 2 MG capsule Take by mouth as needed for diarrhea or loose stools.     No current facility-administered medications on file prior to visit.     Review of Systems Review of Systems  Constitutional: Negative for fever, appetite change, fatigue and unexpected weight change.  Eyes: Negative for pain and visual disturbance.  Respiratory: Negative for cough and shortness of breath.   Cardiovascular: Negative for cp or palpitations    Gastrointestinal: Negative for nausea, diarrhea and constipation.  Genitourinary: Negative for urgency and frequency.  Skin: Negative for pallor or rash   MSK pos for chronic back pain  Neurological: Negative for weakness, light-headedness, numbness and headaches. pos for neuropathy and poor balance  Hematological: Negative for adenopathy. Does not bruise/bleed easily.  Psychiatric/Behavioral: Negative for dysphoric mood. The patient is not nervous/anxious.         Objective:   Physical Exam  Constitutional: She appears well-developed and well-nourished. No distress.  Obese elderly female  Ambulates well with walker   HENT:  Head: Normocephalic and atraumatic.  Mouth/Throat: Oropharynx is clear and moist.  Eyes:  Conjunctivae and EOM are normal. Pupils are equal, round, and reactive to light. No scleral icterus.  Neck: Normal range of motion. Neck supple. No JVD present. Carotid bruit is not present. No thyromegaly present.  Cardiovascular: Normal rate, regular rhythm, normal heart sounds and intact distal pulses.  Exam reveals no gallop.   Pulmonary/Chest: Effort normal and breath sounds normal. No respiratory distress. She has no wheezes. She has no rales.  No crackles  Abdominal: She exhibits no abdominal bruit.  Musculoskeletal: She exhibits no edema.  Poor rom of TS and LS  Mild kyphosis   Lymphadenopathy:    She has no cervical adenopathy.  Neurological: She is alert. She has normal reflexes. No cranial nerve deficit. She exhibits normal muscle tone. Coordination normal.  Mild chorea of legs-baseline   Poor sensation in feet   Skin: Skin is warm and dry. No rash noted.  Psychiatric: She has a normal mood and affect.          Assessment & Plan:   Problem List Items Addressed This Visit      Nervous and Auditory   Peripheral neuropathy ([place]) - Primary    This is stable  On gabapentin and continues to see neurology for this and chorea  No falls Does use a seated walker        Musculoskeletal and Integument   Osteoporosis    Disc imp of vit D intake and level  No falls or fx  She is not yet ready to schedule dexa but will let us know        Other   B12 deficiency    Level is now high  inst to change dosing of 1000 mcg to QOD from daily  Continue to follow Did not help neuropathy symptoms unfortunately      Hypertriglyceridemia    emph imp of low glycemic diet (incl dec refined carbs) She is unsure she can do this  Exercise limited by health problems as well  Continue to follow  F/u 6 mo       PMR (polymyalgia rheumatica) (HCC)    No new c/o  Not currently on prednisone      Vitamin D deficiency    Level is still in  the 20s  inst to inc her intake to 4000  iu daily in addn to her ca plus D bid  Continue to follow  Disc imp to bone and overall health

## 2017-02-08 NOTE — Assessment & Plan Note (Signed)
Level is now high  inst to change dosing of 1000 mcg to QOD from daily  Continue to follow Did not help neuropathy symptoms unfortunately

## 2017-02-08 NOTE — Assessment & Plan Note (Signed)
emph imp of low glycemic diet (incl dec refined carbs) She is unsure she can do this  Exercise limited by health problems as well  Continue to follow  F/u 6 mo

## 2017-02-08 NOTE — Assessment & Plan Note (Signed)
Level is still in the 20s  inst to inc her intake to 4000 iu daily in addn to her ca plus D bid  Continue to follow  Disc imp to bone and overall health

## 2017-02-08 NOTE — Assessment & Plan Note (Signed)
Disc imp of vit D intake and level  No falls or fx  She is not yet ready to schedule dexa but will let us know

## 2017-02-08 NOTE — Assessment & Plan Note (Signed)
This is stable  On gabapentin and continues to see neurology for this and chorea  No falls Does use a seated walker

## 2017-02-08 NOTE — Assessment & Plan Note (Signed)
No new c/o  Not currently on prednisone

## 2017-02-11 ENCOUNTER — Other Ambulatory Visit: Payer: Self-pay | Admitting: Family Medicine

## 2017-02-25 ENCOUNTER — Emergency Department: Payer: Medicare Other

## 2017-02-25 ENCOUNTER — Telehealth: Payer: Self-pay | Admitting: Family Medicine

## 2017-02-25 ENCOUNTER — Emergency Department
Admission: EM | Admit: 2017-02-25 | Discharge: 2017-02-25 | Disposition: A | Discharge status: Home / Self Care | Payer: Medicare Other | Attending: Emergency Medicine | Admitting: Emergency Medicine

## 2017-02-25 ENCOUNTER — Encounter: Payer: Self-pay | Admitting: Emergency Medicine

## 2017-02-25 DIAGNOSIS — Z87891 Personal history of nicotine dependence: Secondary | ICD-10-CM | POA: Insufficient documentation

## 2017-02-25 DIAGNOSIS — M545 Low back pain, unspecified: Secondary | ICD-10-CM

## 2017-02-25 DIAGNOSIS — R109 Unspecified abdominal pain: Secondary | ICD-10-CM | POA: Diagnosis not present

## 2017-02-25 LAB — URINALYSIS, COMPLETE (UACMP) WITH MICROSCOPIC
Bacteria, UA: NONE SEEN
Bilirubin Urine: NEGATIVE
Glucose, UA: NEGATIVE mg/dL
Hgb urine dipstick: NEGATIVE
Ketones, ur: 5 mg/dL — AB
Leukocytes, UA: NEGATIVE
Nitrite: NEGATIVE
Protein, ur: NEGATIVE mg/dL
Specific Gravity, Urine: 1.018 (ref 1.005–1.030)
pH: 5 (ref 5.0–8.0)

## 2017-02-25 LAB — CBC
HCT: 36.2 % (ref 35.0–47.0)
Hemoglobin: 12.4 g/dL (ref 12.0–16.0)
MCH: 29 pg (ref 26.0–34.0)
MCHC: 34.3 g/dL (ref 32.0–36.0)
MCV: 84.7 fL (ref 80.0–100.0)
Platelets: 260 10*3/uL (ref 150–440)
RBC: 4.28 MIL/uL (ref 3.80–5.20)
RDW: 14.5 % (ref 11.5–14.5)
WBC: 8.2 10*3/uL (ref 3.6–11.0)

## 2017-02-25 LAB — COMPREHENSIVE METABOLIC PANEL
ALT: <5 U/L — ABNORMAL LOW (ref 14–54)
AST: 20 U/L (ref 15–41)
Albumin: 3.9 g/dL (ref 3.5–5.0)
Alkaline Phosphatase: 86 U/L (ref 38–126)
Anion gap: 7 (ref 5–15)
BUN: 14 mg/dL (ref 6–20)
CO2: 25 mmol/L (ref 22–32)
Calcium: 8.8 mg/dL — ABNORMAL LOW (ref 8.9–10.3)
Chloride: 108 mmol/L (ref 101–111)
Creatinine, Ser: 0.87 mg/dL (ref 0.44–1.00)
GFR calc Af Amer: >60 mL/min (ref >60)
GFR calc non Af Amer: 60 mL/min — ABNORMAL LOW (ref >60)
Glucose, Bld: 100 mg/dL — ABNORMAL HIGH (ref 65–99)
Potassium: 3.8 mmol/L (ref 3.5–5.1)
Sodium: 140 mmol/L (ref 135–145)
Total Bilirubin: 0.6 mg/dL (ref 0.3–1.2)
Total Protein: 7.5 g/dL (ref 6.5–8.1)

## 2017-02-25 LAB — LIPASE, BLOOD: Lipase: 17 U/L (ref 11–51)

## 2017-02-25 MED ORDER — TRAMADOL HCL 50 MG PO TABS
100.0000 mg | ORAL_TABLET | Freq: Once | ORAL | Status: AC
  Administered 2017-02-25: 100 mg via ORAL
  Filled 2017-02-25: qty 2

## 2017-02-25 MED ORDER — ONDANSETRON HCL 4 MG/2ML IJ SOLN
4.0000 mg | Freq: Once | INTRAMUSCULAR | Status: AC
  Administered 2017-02-25: 4 mg via INTRAVENOUS
  Filled 2017-02-25: qty 2

## 2017-02-25 MED ORDER — TRAMADOL HCL 50 MG PO TABS: 50.0000 mg | ORAL_TABLET | Freq: Four times a day (QID) | ORAL | 0 refills | Status: DC | PRN

## 2017-02-25 MED ORDER — FENTANYL CITRATE (PF) 100 MCG/2ML IJ SOLN
50.0000 ug | Freq: Once | INTRAMUSCULAR | Status: DC
Start: 2017-02-25 — End: 2017-02-25
  Filled 2017-02-25: qty 2

## 2017-02-25 MED ORDER — FENTANYL CITRATE (PF) 100 MCG/2ML IJ SOLN
50.0000 ug | Freq: Once | INTRAMUSCULAR | Status: AC
  Administered 2017-02-25: 50 ug via INTRAVENOUS

## 2017-02-25 NOTE — Discharge Instructions (Signed)
As we discussed please take your pain medication as needed, as prescribed. May also take Tylenol in addition to this pain medication as written on the box. Please follow-up with with a [facility] on Monday. Continued to have back discomfort. Return to the emergency department for any increase in back pain, fever, or any other symptom personally concerning to yourself.

## 2017-02-25 NOTE — ED Provider Notes (Signed)
[facility] Emergency Department Provider Note  Time seen: 12:39 PM  I have reviewed the triage vital signs and the nursing notes.   HISTORY  Chief Complaint Back Pain    HPI Julie [name] is a 83 y.o. female with a past medical history of gastric reflux, hyperlipidemia, IBS, arthritis, presents to the emergency department left flank pain. According to the patient yesterday she developed significant pain in her left flank somewhat worse with movement. Denies any inciting event. Denies any dysuria or hematuria. Denies fever, nausea, vomiting, diarrhea. Patient went to the walk-in clinic today for evaluation and was sent to the emergency department. States normal bowel movements. Denies black or bloody stool.  Past Medical History:  Diagnosis Date  . Adenoma   . Chronic leg pain   . Colon polyp   . DDD (degenerative disc disease), cervical   . Diverticulosis of colon    internal----Dr. Vira Agar  . Duodenitis   . Esophagitis   . GERD (gastroesophageal reflux disease)   . Hemorrhoids   . HLD (hyperlipidemia)   . IBS (irritable bowel syndrome)    with PP diarrhea  . Neuropathy ([place])   . Neuropathy ([place])   . OA (osteoarthritis) of knee   . Osteopenia   . PONV (postoperative nausea and vomiting)   . Scoliosis   . Seborrheic dermatitis   . Shortness of breath dyspnea    with activity  . TMJ syndrome   . Trochanteric bursitis   . Unsteady gait    FALLS EASILY    Patient Active Problem List   Diagnosis Date Noted  . Routine general medical examination at a health care facility 08/08/2016  . Left knee pain 02/01/2016  . Total knee replacement status 10/15/2015  . B12 deficiency 07/16/2015  . Peripheral neuropathy ([place]) 07/16/2015  . 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 Observation after surgery 02/13/2015  . Phalanx fracture, foot 01/10/2015  . Colon cancer screening 09/22/2014  . Osteoarthritis of left lower extremity 09/09/2014  . History of falling  04/26/2014  . Poor balance 04/26/2014  . Left-sided weakness 04/26/2014  . PMR (polymyalgia rheumatica) (HCC) 10/11/2013  . Encounter for Medicare annual wellness exam 07/13/2013  . Risk for falls 07/13/2013  . Skin tag of labia 03/14/2013  . Back pain, thoracic 08/07/2011  . Weakness of left leg 07/30/2011  . BACK PAIN, LUMBAR 12/10/2010  . Vitamin D deficiency 08/16/2010  . GERD 03/26/2010  . DYSPEPSIA 03/26/2010  . IRRITABLE BOWEL SYNDROME 03/26/2010  . Osteoporosis 08/27/2009  . Hypertriglyceridemia 07/24/2009  . DIVERTICULOSIS, COLON 07/24/2009  . OSTEOARTHRITIS 07/24/2009  . DIARRHEA, PERSISTENT 07/24/2009  . COLONIC POLYPS, HX OF 07/24/2009  . POSTMENOPAUSAL STATUS 07/24/2009    Past Surgical History:  Procedure Laterality Date  . CARPAL TUNNEL RELEASE Left 01/18/2013   Procedure: CARPAL TUNNEL RELEASE;  Surgeon: Wynonia Sours, MD;  Location: [facility] [place];  Service: Orthopedics;  Laterality: Left;  OSTEOTOMY LEFT DISTAL RADIUS BONE CHIPS CARPAL TUNNEL RELEASE LEFT   . CATARACT EXTRACTION W/PHACO Right 08/07/2015   Procedure: CATARACT EXTRACTION PHACO AND INTRAOCULAR LENS PLACEMENT (IOC);  Surgeon: Birder Robson, MD;  Location: [facility];  Service: Ophthalmology;  Laterality: Right;  [place] 00:48.0 AP   22.3 CDE 10.69 casette lot # [id number]  . CATARACT EXTRACTION W/PHACO Left 09/04/2015   Procedure: CATARACT EXTRACTION PHACO AND INTRAOCULAR LENS PLACEMENT (IOC);  Surgeon: Birder Robson, MD;  Location: [facility];  Service: Ophthalmology;  Laterality: Left;  [place] AP CDE FLUID LOT #  [id number]  . CEREBRAL ANEURYSM REPAIR  2001   clamps  . COLONOSCOPY  1/11   polyps-hyperplastic and adenomatous  . FOOT SURGERY  2010   bilateral hammer toes and "knot"  . FRACTURE SURGERY Right 01/2014   result of a fall  . FRACTURE SURGERY Left 03/2014   result of a fall  . HEMORRHOID SURGERY    . KNEE ARTHROPLASTY Left 10/15/2015   Procedure: COMPUTER ASSISTED TOTAL KNEE  ARTHROPLASTY;  Surgeon: Dereck Leep, MD;  Location: [facility];  Service: Orthopedics;  Laterality: Left;  . OPEN REDUCTION INTERNAL FIXATION (ORIF) DISTAL RADIAL FRACTURE Right 02/13/2015   Procedure: OPEN REDUCTION INTERNAL FIXATION (ORIF) RIGHT DISTAL RADIUS;  Surgeon: Daryll Brod, MD;  Location: [facility];  Service: Orthopedics;  Laterality: Right;  . OPEN REDUCTION INTERNAL FIXATION (ORIF) FINGER WITH RADIAL BONE GRAFT Left 11/01/2013   Procedure: OPEN REDUCTION INTERNAL FIXATION (ORIF) LEFT SMALL FINGER;  Surgeon: Wynonia Sours, MD;  Location: [facility];  Service: Orthopedics;  Laterality: Left;  . REPLACEMENT TOTAL KNEE Left   . TOTAL ABDOMINAL HYSTERECTOMY    . WRIST OSTEOTOMY Left 01/18/2013   Procedure: WRIST OSTEOTOMY;  Surgeon: Wynonia Sours, MD;  Location: [place] [facility];  Service: Orthopedics;  Laterality: Left;    Prior to Admission medications   Medication Sig Start Date End Date Taking? Authorizing Provider  acetaminophen (TYLENOL) 500 MG tablet Take 500 mg by mouth every 6 (six) hours as needed.    Historical Provider, MD  Alum Hydroxide-Mag Carbonate (GAVISCON EXTRA STRENGTH) 160-105 MG CHEW Chew 1 tablet by mouth as needed (acid reflux).    Historical Provider, MD  carbidopa-levodopa (SINEMET IR) 25-100 MG tablet TAKE 1 TABLET BY MOUTH 3 (THREE) TIMES DAILY. 10/27/16   Eustace Quail Tat, DO  cyanocobalamin 1000 MCG tablet Take 1,000 mcg by mouth daily. In am    Historical Provider, MD  gabapentin (NEURONTIN) 300 MG capsule TAKE 1 CAPSULE (300 MG TOTAL) BY MOUTH AT BEDTIME. 01/29/17   Abner Greenspan, MD  loperamide (IMODIUM) 2 MG capsule Take by mouth as needed for diarrhea or loose stools.    Historical Provider, MD    Allergies  Allergen Reactions  . Codeine     REACTION: headache and nausea and vomiting    Family History  Problem Relation Age of Onset  . Colon cancer Mother   . Osteoporosis      hip fracture  . Stroke Brother   .  Colon cancer Brother   . Hypertension Brother   . Other Brother     Heart problem  . Colon cancer Brother   . Colon cancer Maternal Aunt     Social History Social History  Substance Use Topics  . Smoking status: Former Smoker    Quit date: 10/02/1954  . Smokeless tobacco: Never Used  . Alcohol use No    Review of Systems Constitutional: Negative for fever. Cardiovascular: Negative for chest pain. Respiratory: Negative for shortness of breath. Gastrointestinal: Left flank pain. Denies any anterior abdominal pain. Denies nausea vomiting or diarrhea. Genitourinary: Negative for dysuria. Negative for hematuria Neurological: Negative for headache 10-point ROS otherwise negative.  ____________________________________________   PHYSICAL EXAM:  VITAL SIGNS: ED Triage Vitals  Enc Vitals Group     BP 02/25/17 1042 (!) 154/98     Pulse Rate 02/25/17 1042 82     Resp 02/25/17 1042 16     Temp 02/25/17 1042 98.6 F (37 C)  Temp Source 02/25/17 1042 Oral     SpO2 02/25/17 1042 94 %     Weight 02/25/17 1043 185 lb (83.9 kg)     Height 02/25/17 1043 5\' 3"  (1.6 m)     Head Circumference --      Peak Flow --      Pain Score 02/25/17 1042 9     Pain Loc --      Pain Edu? --      Excl. in [place]? --     Constitutional: Alert and oriented. Well appearing and in no distress. Eyes: Normal exam ENT   Head: Normocephalic and atraumatic.   Mouth/Throat: Mucous membranes are moist. Cardiovascular: Normal rate, regular rhythm.  Respiratory: Normal respiratory effort without tachypnea nor retractions. Breath sounds are clear Gastrointestinal: Soft and nontender. No distention.  No CVA tenderness. Musculoskeletal: Nontender with normal range of motion in all extremities.  Neurologic:  Normal speech and language. No gross focal neurologic deficits  Skin:  Skin is warm, dry and intact.  Psychiatric: Mood and affect are normal.    ____________________________________________   RADIOLOGY  IMPRESSION: 1. No nephrolithiasis or obstructive uropathy. 2. Sigmoid diverticulosis without diverticulitis. 3. Aortic Atherosclerosis (ICD10-I70.0) 4. Levoconvex curvature of the lumbar spine.  ____________________________________________   INITIAL IMPRESSION / ASSESSMENT AND PLAN / ED COURSE  Pertinent labs & imaging results that were available during my care of the patient were reviewed by me and considered in my medical decision making (see chart for details).  She presents with left flank pain starting yesterday. Patient describes the pain as moderate to severe 8/10 currently sharp in nature. Otherwise largely negative review of systems such as no fever, anterior abdominal pain, nausea, vomiting, diarrhea, constipation. We will check labs, urinalysis, CT renal scan to further evaluate.  CT scan is negative. Highly suspect muscular skeletal pain given a negative CT scan and normal lab workup. Patient states that that moment her nauseated. She states whenever she received after her knee surgery is healing pain medication that she is able to take that does not make her nauseous. I have reviewed the patient's records and it appears the patient has received Ultram in the past without ill response. We will discharge with Ultram and orthopedic follow-up with Dr. Bess Harvest. Patient is agreeable to plan. I discussed return precautions for any worsening pain or fever  ____________________________________________   FINAL CLINICAL IMPRESSION(S) / ED DIAGNOSES  Left flank pain    Harvest Dark, MD 02/25/17 1447

## 2017-02-25 NOTE — Telephone Encounter (Signed)
I will watch for notes from [facility] clinic

## 2017-02-25 NOTE — Telephone Encounter (Signed)
I spoke with pt and lt flank pain started 02/24/17; pain level now is 10; no diarrhea, vomiting or fever.No available appts at [facility] and pt does not want to go to another [facility] office. Pt will go to [facility] walk in now. FYI to Dr Glori Bickers.

## 2017-02-25 NOTE — Telephone Encounter (Signed)
[facility] [facility] Patient Name: Julie [name] DOB: 01/18/1932 Initial Comment caller states she has left side back pain Nurse Assessment Nurse: Erling Cruz, RN, [facility] Hummingbird Date/Time (Eastern Time): 02/25/2017 8:56:38 AM Confirm and document reason for call. If symptomatic, describe symptoms. ---Caller states she has left side back pain. Started yesterday. Frequent urination is normal. Does the patient have any new or worsening symptoms? ---Yes Will a triage be completed? ---Yes Related visit to physician within the last 2 weeks? ---No Does the PT have any chronic conditions? (i.e. diabetes, asthma, etc.) ---No Is this a behavioral health or substance abuse call? ---No Guidelines Guideline Title Affirmed Question Affirmed Notes Flank Pain [1] SEVERE pain (e.g., excruciating, scale 8-10) AND [2] present > 1 hour Final Disposition User Go to ED Now Love, RN, [employer] with [place], LPN. Gave report. She will contact patient. Caller states she does not want to go to ED & would like to see if she can make an appointment. Advised caller (per directives) that I will have office call her back. Referrals GO TO FACILITY REFUSED Disagree/Comply: Comply

## 2017-02-25 NOTE — ED Notes (Signed)
Pt and Pt's husband verbalized understanding of discharge instructions. NAD at this time.

## 2017-02-25 NOTE — ED Triage Notes (Signed)
Says pain in back .  Went to [facility] and they sent here for eval for kidney stone.  No history of stones.  Pain left flank. No radiation

## 2017-02-26 NOTE — Telephone Encounter (Signed)
Pt called to give update as requested. She is better but still in a lot of pain. She will get tramadal- 50mg  filled today, hopes that will help.

## 2017-02-26 NOTE — Telephone Encounter (Signed)
Spoken and notified patient of Dr Marliss Coots comments. Patient stated that she was told to see her primary doctor. I notified patient that Dr Glori Bickers is out of office next week. I did schedule patient as requested on 03/09/2017. I also told patient if no improvement please call our office to see first available.

## 2017-02-26 NOTE — Telephone Encounter (Signed)
It looks like they did a CT scan and she did not have a kidney stone.  They thought pain was musculoskelatal. If no improvement upcoming please f/u with first available.  If a rash appears please let us know (shingles can start with pain)

## 2017-03-04 ENCOUNTER — Telehealth: Payer: Self-pay

## 2017-03-04 ENCOUNTER — Telehealth: Payer: Self-pay | Admitting: *Deleted

## 2017-03-04 DIAGNOSIS — G8929 Other chronic pain: Secondary | ICD-10-CM | POA: Diagnosis not present

## 2017-03-04 DIAGNOSIS — M5442 Lumbago with sciatica, left side: Secondary | ICD-10-CM | POA: Diagnosis not present

## 2017-03-04 NOTE — Telephone Encounter (Signed)
Shelby at [facility] called to let us know pt is having back pain and they cannot see pt till end of April and wanted to know if pt could be scheduled at [facility]. I called pt and offered appt on 03/05/17 but pt is out of pain med and wants to be seen today. Pt already has appt with Dr Glori Bickers on Mon. Pt said she is going to [facility] walk in now and will cb if needed. FYI to Dr Glori Bickers.

## 2017-03-04 NOTE — Telephone Encounter (Signed)
Pt left message on triage requesting a medication refill, but did not advise on which med. LM on pts vm requesting that she contact pharmacy and have them send request for refill

## 2017-03-09 ENCOUNTER — Ambulatory Visit (INDEPENDENT_AMBULATORY_CARE_PROVIDER_SITE_OTHER): Payer: Medicare Other | Admitting: Family Medicine

## 2017-03-09 ENCOUNTER — Encounter: Payer: Self-pay | Admitting: Family Medicine

## 2017-03-09 ENCOUNTER — Encounter (INDEPENDENT_AMBULATORY_CARE_PROVIDER_SITE_OTHER): Payer: Self-pay

## 2017-03-09 VITALS — BP 124/68 | HR 75 | Temp 98.0°F | Ht 60.5 in | Wt 180.0 lb

## 2017-03-09 DIAGNOSIS — G255 Other chorea: Secondary | ICD-10-CM

## 2017-03-09 DIAGNOSIS — G8929 Other chronic pain: Secondary | ICD-10-CM | POA: Diagnosis not present

## 2017-03-09 DIAGNOSIS — M5442 Lumbago with sciatica, left side: Secondary | ICD-10-CM | POA: Diagnosis not present

## 2017-03-09 NOTE — Progress Notes (Signed)
Subjective:    Patient ID: Julie [name], female    DOB: [birth year], 83 y.o.   MRN: [id number]  HPI  Here for flank pain that occurred after getting up to get out of a chair  Seen in ED 3/28 for L flank pain  CT scan done then did not show a renal stone Ct Renal Stone Study  Result Date: 02/25/2017 CLINICAL DATA:  Left flank pain. EXAM: CT ABDOMEN AND PELVIS WITHOUT CONTRAST TECHNIQUE: Multidetector CT imaging of the abdomen and pelvis was performed following the standard protocol without IV contrast. COMPARISON:  None. FINDINGS: Lower chest: Mild dependent atelectasis is present at both lung bases. Heart is enlarged. Calcifications are present at the aortic bowel. No significant pleural or pericardial effusion is present. Hepatobiliary: A benign-appearing 5 mm cyst is present at the dome of the liver. No other focal hepatic lesions are present. The common bile duct is within normal limits. 3 mm stone is present at neck of the gallbladder without inflammatory change. The gallbladder is otherwise unremarkable. Pancreas: Unremarkable. No pancreatic ductal dilatation or surrounding inflammatory changes. Spleen: Normal in size without focal abnormality. Adrenals/Urinary Tract: Adrenal glands are normal bilaterally. Kidneys and ureters are within normal limits. The urinary bladder is unremarkable. Stomach/Bowel: A small hiatal hernia is present. Stomach duodenum are within normal limits. The small bowel is unremarkable. The appendix is visualized and normal. The ascending and transverse colon are within normal limits. The descending colon is normal periods sigmoid diverticula are present without focal inflammation to suggest diverticulitis. Vascular/Lymphatic: Atherosclerotic calcifications are present without aneurysm. Dense calcifications are present at the origin of the left renal artery suggesting possible stenosis. There are no calcifications at the origin of the right renal artery. No significant  retroperitoneal or pelvic adenopathy is present. Reproductive: Hysterectomy and bilateral oophorectomy are noted. Other: No abdominal wall hernia or abnormality. No abdominopelvic ascites. Musculoskeletal: Levoconvex scoliosis of the lumbar spine is centered at L4. Grade 1 anterolisthesis is associated with facet disease lead to S1. No focal lytic or blastic lesions are present. The bony pelvis is intact. IMPRESSION: 1. No nephrolithiasis or obstructive uropathy. 2. Sigmoid diverticulosis without diverticulitis. 3.  Aortic Atherosclerosis (ICD10-I70.0) 4. Levoconvex curvature of the lumbar spine. Electronically Signed   By: San Morelle M.D.   On: 02/25/2017 13:37    Her pain was thought to be due to MSK etiology and given tramadol  bp was elevated at that visit 154/98  She has known scoliosis  Also deg disc dz in LS   Results for orders placed or performed during the hospital encounter of 02/25/17  Lipase, blood  Result Value Ref Range   Lipase 17 11 - 51 U/L  Comprehensive metabolic panel  Result Value Ref Range   Sodium 140 135 - 145 mmol/L   Potassium 3.8 3.5 - 5.1 mmol/L   Chloride 108 101 - 111 mmol/L   CO2 25 22 - 32 mmol/L   Glucose, Bld 100 (H) 65 - 99 mg/dL   BUN 14 6 - 20 mg/dL   Creatinine, Ser 0.87 0.44 - 1.00 mg/dL   Calcium 8.8 (L) 8.9 - 10.3 mg/dL   Total Protein 7.5 6.5 - 8.1 g/dL   Albumin 3.9 3.5 - 5.0 g/dL   AST 20 15 - 41 U/L   ALT <5 (L) 14 - 54 U/L   Alkaline Phosphatase 86 38 - 126 U/L   Total Bilirubin 0.6 0.3 - 1.2 mg/dL   GFR calc non Af Amer 60 (  L) >60 mL/min   GFR calc Af Amer >60 >60 mL/min   Anion gap 7 5 - 15  CBC  Result Value Ref Range   WBC 8.2 3.6 - 11.0 K/uL   RBC 4.28 3.80 - 5.20 MIL/uL   Hemoglobin 12.4 12.0 - 16.0 g/dL   HCT 36.2 35.0 - 47.0 %   MCV 84.7 80.0 - 100.0 fL   MCH 29.0 26.0 - 34.0 pg   MCHC 34.3 32.0 - 36.0 g/dL   RDW 14.5 11.5 - 14.5 %   Platelets 260 150 - 440 K/uL  Urinalysis, Complete w Microscopic  Result  Value Ref Range   Color, Urine YELLOW (A) YELLOW   APPearance CLEAR (A) CLEAR   Specific Gravity, Urine 1.018 1.005 - 1.030   pH 5.0 5.0 - 8.0   Glucose, UA NEGATIVE NEGATIVE mg/dL   Hgb urine dipstick NEGATIVE NEGATIVE   Bilirubin Urine NEGATIVE NEGATIVE   Ketones, ur 5 (A) NEGATIVE mg/dL   Protein, ur NEGATIVE NEGATIVE mg/dL   Nitrite NEGATIVE NEGATIVE   Leukocytes, UA NEGATIVE NEGATIVE   RBC / HPF 0-5 0 - 5 RBC/hpf   WBC, UA 0-5 0 - 5 WBC/hpf   Bacteria, UA NONE SEEN NONE SEEN   Squamous Epithelial / LPF 0-5 (A) NONE SEEN   Mucous PRESENT        Was seen for L sided low back pain with sciatica on 4/4 at [facility] urgent care -given tramadol   She still "hurts real bad"  She also has been constipated from tramadol- stopped it because it did not help pain anyway  miralax now - and taking a while to work   Her pain is in L lower abdomen and it radiates to her L lower abdomen  Cannot get comfortable in bed  Tried tylenol - and it did not help much    She has not seen Dr Danella Penton in the past for her back   Patient Active Problem List   Diagnosis Date Noted  . Routine general medical examination at a health care facility 08/08/2016  . Left knee pain 02/01/2016  . Total knee replacement status 10/15/2015  . B12 deficiency 07/16/2015  . Peripheral neuropathy ([place]) 07/16/2015  . 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 Observation after surgery 02/13/2015  . Phalanx fracture, foot 01/10/2015  . Colon cancer screening 09/22/2014  . Osteoarthritis of left lower extremity 09/09/2014  . History of falling 04/26/2014  . Poor balance 04/26/2014  . Left-sided weakness 04/26/2014  . PMR (polymyalgia rheumatica) (HCC) 10/11/2013  . Encounter for Medicare annual wellness exam 07/13/2013  . Risk for falls 07/13/2013  . Skin tag of labia 03/14/2013  . Back pain, thoracic 08/07/2011  . Weakness of left leg 07/30/2011  . BACK PAIN, LUMBAR 12/10/2010  . Vitamin D deficiency 08/16/2010  . GERD  03/26/2010  . DYSPEPSIA 03/26/2010  . IRRITABLE BOWEL SYNDROME 03/26/2010  . Osteoporosis 08/27/2009  . Hypertriglyceridemia 07/24/2009  . DIVERTICULOSIS, COLON 07/24/2009  . OSTEOARTHRITIS 07/24/2009  . DIARRHEA, PERSISTENT 07/24/2009  . COLONIC POLYPS, HX OF 07/24/2009  . POSTMENOPAUSAL STATUS 07/24/2009   Past Medical History:  Diagnosis Date  . Adenoma   . Chronic leg pain   . Colon polyp   . DDD (degenerative disc disease), cervical   . Diverticulosis of colon    internal----Dr. Vira Agar  . Duodenitis   . Esophagitis   . GERD (gastroesophageal reflux disease)   . Hemorrhoids   . HLD (hyperlipidemia)   . IBS (irritable bowel syndrome)  with PP diarrhea  . Neuropathy ([place])   . Neuropathy ([place])   . OA (osteoarthritis) of knee   . Osteopenia   . PONV (postoperative nausea and vomiting)   . Scoliosis   . Seborrheic dermatitis   . Shortness of breath dyspnea    with activity  . TMJ syndrome   . Trochanteric bursitis   . Unsteady gait    FALLS EASILY   Past Surgical History:  Procedure Laterality Date  . CARPAL TUNNEL RELEASE Left 01/18/2013   Procedure: CARPAL TUNNEL RELEASE;  Surgeon: Wynonia Sours, MD;  Location: [place];  Service: Orthopedics;  Laterality: Left;  OSTEOTOMY LEFT DISTAL RADIUS BONE CHIPS CARPAL TUNNEL RELEASE LEFT   . CATARACT EXTRACTION W/PHACO Right 08/07/2015   Procedure: CATARACT EXTRACTION PHACO AND INTRAOCULAR LENS PLACEMENT (IOC);  Surgeon: Birder Robson, MD;  Location: [facility];  Service: Ophthalmology;  Laterality: Right;  [place] 00:48.0 AP   22.3 CDE 10.69 casette lot # [id number]  . CATARACT EXTRACTION W/PHACO Left 09/04/2015   Procedure: CATARACT EXTRACTION PHACO AND INTRAOCULAR LENS PLACEMENT (IOC);  Surgeon: Birder Robson, MD;  Location: [facility];  Service: Ophthalmology;  Laterality: Left;  [place] AP CDE FLUID LOT # [id number]  . CEREBRAL ANEURYSM REPAIR  2001   clamps  . COLONOSCOPY  1/11   polyps-hyperplastic and  adenomatous  . FOOT SURGERY  2010   bilateral hammer toes and "knot"  . FRACTURE SURGERY Right 01/2014   result of a fall  . FRACTURE SURGERY Left 03/2014   result of a fall  . HEMORRHOID SURGERY    . KNEE ARTHROPLASTY Left 10/15/2015   Procedure: COMPUTER ASSISTED TOTAL KNEE ARTHROPLASTY;  Surgeon: Dereck Leep, MD;  Location: [facility];  Service: Orthopedics;  Laterality: Left;  . OPEN REDUCTION INTERNAL FIXATION (ORIF) DISTAL RADIAL FRACTURE Right 02/13/2015   Procedure: OPEN REDUCTION INTERNAL FIXATION (ORIF) RIGHT DISTAL RADIUS;  Surgeon: Daryll Brod, MD;  Location: [place];  Service: Orthopedics;  Laterality: Right;  . OPEN REDUCTION INTERNAL FIXATION (ORIF) FINGER WITH RADIAL BONE GRAFT Left 11/01/2013   Procedure: OPEN REDUCTION INTERNAL FIXATION (ORIF) LEFT SMALL FINGER;  Surgeon: Wynonia Sours, MD;  Location: [place];  Service: Orthopedics;  Laterality: Left;  . REPLACEMENT TOTAL KNEE Left   . TOTAL ABDOMINAL HYSTERECTOMY    . WRIST OSTEOTOMY Left 01/18/2013   Procedure: WRIST OSTEOTOMY;  Surgeon: Wynonia Sours, MD;  Location: [place];  Service: Orthopedics;  Laterality: Left;   Social History  Substance Use Topics  . Smoking status: Former Smoker    Quit date: 10/02/1954  . Smokeless tobacco: Never Used  . Alcohol use No   Family History  Problem Relation Age of Onset  . Colon cancer Mother   . Osteoporosis      hip fracture  . Stroke Brother   . Colon cancer Brother   . Hypertension Brother   . Other Brother     Heart problem  . Colon cancer Brother   . Colon cancer Maternal Aunt    Allergies  Allergen Reactions  . Codeine     REACTION: headache and nausea and vomiting   Current Outpatient Prescriptions on File Prior to Visit  Medication Sig Dispense Refill  . acetaminophen (TYLENOL) 500 MG tablet Take 500 mg by mouth every 6 (six) hours as needed.    . Alum Hydroxide-Mag Carbonate (GAVISCON EXTRA STRENGTH)  160-105 MG CHEW Chew 1 tablet by mouth as needed (acid  reflux).    . carbidopa-levodopa (SINEMET IR) 25-100 MG tablet TAKE 1 TABLET BY MOUTH 3 (THREE) TIMES DAILY. 90 tablet 2  . cyanocobalamin 1000 MCG tablet Take 1,000 mcg by mouth daily. In am    . gabapentin (NEURONTIN) 300 MG capsule TAKE 1 CAPSULE (300 MG TOTAL) BY MOUTH AT BEDTIME. 30 capsule 5  . loperamide (IMODIUM) 2 MG capsule Take by mouth as needed for diarrhea or loose stools.    . traMADol (ULTRAM) 50 MG tablet Take 1 tablet (50 mg total) by mouth every 6 (six) hours as needed. 20 tablet 0   No current facility-administered medications on file prior to visit.     Review of Systems Review of Systems  Constitutional: Negative for fever, appetite change,  and unexpected weight change.  Eyes: Negative for pain and visual disturbance.  Respiratory: Negative for cough and shortness of breath.   Cardiovascular: Negative for cp or palpitations    Gastrointestinal: Negative for nausea, diarrhea and constipation.  Genitourinary: Negative for urgency and frequency. pos for baseline incontinence, neg for dysuria or hematuria  MSK pos for L flank and low back pain with radiation to L leg and lower abdomen  Skin: Negative for pallor or rash   Neurological: Negative for weakness, light-headedness, numbness and headaches.  Hematological: Negative for adenopathy. Does not bruise/bleed easily.  Psychiatric/Behavioral: Negative for dysphoric mood. The patient is not nervous/anxious.         Objective:   Physical Exam  Constitutional: She appears well-developed and well-nourished. No distress.  overwt elderly female sitting in seated walker  HENT:  Head: Normocephalic and atraumatic.  Mouth/Throat: Oropharynx is clear and moist.  Eyes: Conjunctivae and EOM are normal. Pupils are equal, round, and reactive to light.  Neck: Normal range of motion. Neck supple. No JVD present. Carotid bruit is not present. No thyromegaly present.    Cardiovascular: Normal rate, regular rhythm, normal heart sounds and intact distal pulses.  Exam reveals no gallop.   Pulmonary/Chest: Effort normal and breath sounds normal. No respiratory distress. She has no wheezes. She has no rales.  No crackles  Abdominal: Soft. Bowel sounds are normal. She exhibits no distension, no abdominal bruit and no mass. There is no tenderness. There is no rebound and no guarding.  No tenderness in areas she c/o pain (L lower abd and flank)   Musculoskeletal: She exhibits tenderness. She exhibits no edema.       Lumbar back: She exhibits decreased range of motion, tenderness, bony tenderness and spasm. She exhibits no edema.  Thoracolumbar scoliosis noted Mild kyphosis  Tender over lower LS with limited rom as well as lower LS musculature Inc pain with L bent leg raise  rom bilat hips is fairly nl  Slow/steady gait with walker  Pain on ext of LS  No foot drop  No change in baseline chorea  No cva tenderness   No new neuro changes  l  Lymphadenopathy:    She has no cervical adenopathy.  Neurological: She is alert. She has normal reflexes. She displays no atrophy. She exhibits normal muscle tone. Gait abnormal.  Baseline mild chorea of L LE Baseline dec quad strength of L LE    Skin: Skin is warm and dry. No rash noted. No pallor.  No rash in areas of pain   Psychiatric: She has a normal mood and affect.  Mentally sharp          Assessment & Plan:   Problem List Items Addressed This  Visit      Other   BACK PAIN, LUMBAR - Primary    Pt has hx of thoracolumbar scoliosis and deg joint and disc dz with likely spinal stenosis  Most of her pain is in the L side- flank to low back with radiation to L leg  Reviewed hospital records, lab results and studies in detail   Acute on chronic-rev her ED visit with re assuring CT of abd and pelvis (rev with pt)-no renal stones or GI cause  No rash on exam- but aware that shingles is always possible and will  watch for this  No imp with tramadol  Using prn tylenol (not over using) Heat and cold prn Recommend frequent short walks with walker Ref to ortho for further eval (has seen Dr Louanne Skye in the past)- she may be a candidate for injection possibly  Will update if symptoms change       Relevant Orders   Ambulatory referral to Orthopedic Surgery

## 2017-03-09 NOTE — Patient Instructions (Signed)
Continue miralax for constipation  Take small walks to loosen back with frequent breaks  Continue tylenol for pain as needed  Alternate heat and ice for 10 minutes at a time  We will refer you to orthopedics for help with your back

## 2017-03-09 NOTE — Assessment & Plan Note (Signed)
Pt has hx of thoracolumbar scoliosis and deg joint and disc dz with likely spinal stenosis  Most of her pain is in the L side- flank to low back with radiation to L leg  Reviewed hospital records, lab results and studies in detail   Acute on chronic-rev her ED visit with re assuring CT of abd and pelvis (rev with pt)-no renal stones or GI cause  No rash on exam- but aware that shingles is always possible and will watch for this  No imp with tramadol  Using prn tylenol (not over using) Heat and cold prn Recommend frequent short walks with walker Ref to ortho for further eval (has seen Dr Louanne Skye in the past)- she may be a candidate for injection possibly  Will update if symptoms change

## 2017-03-09 NOTE — Assessment & Plan Note (Signed)
Noting more in her L leg today also- but this is the leg that bothers her with sciatica and she cannot get comfortable

## 2017-03-10 ENCOUNTER — Ambulatory Visit: Payer: Self-pay | Admitting: Family Medicine

## 2017-03-11 ENCOUNTER — Other Ambulatory Visit: Payer: Self-pay | Admitting: Neurology

## 2017-03-17 ENCOUNTER — Encounter (INDEPENDENT_AMBULATORY_CARE_PROVIDER_SITE_OTHER): Payer: Self-pay

## 2017-03-17 ENCOUNTER — Encounter (INDEPENDENT_AMBULATORY_CARE_PROVIDER_SITE_OTHER): Payer: Self-pay | Admitting: Specialist

## 2017-03-17 ENCOUNTER — Ambulatory Visit (INDEPENDENT_AMBULATORY_CARE_PROVIDER_SITE_OTHER): Payer: Medicare Other

## 2017-03-17 ENCOUNTER — Ambulatory Visit (INDEPENDENT_AMBULATORY_CARE_PROVIDER_SITE_OTHER): Payer: Medicare Other | Admitting: Specialist

## 2017-03-17 VITALS — BP 130/67 | HR 75 | Ht 63.5 in | Wt 180.0 lb

## 2017-03-17 DIAGNOSIS — M48062 Spinal stenosis, lumbar region with neurogenic claudication: Secondary | ICD-10-CM

## 2017-03-17 DIAGNOSIS — M545 Low back pain: Secondary | ICD-10-CM

## 2017-03-17 DIAGNOSIS — M4155 Other secondary scoliosis, thoracolumbar region: Secondary | ICD-10-CM | POA: Diagnosis not present

## 2017-03-17 DIAGNOSIS — M2170 Unequal limb length (acquired), unspecified site: Secondary | ICD-10-CM | POA: Diagnosis not present

## 2017-03-17 DIAGNOSIS — M5416 Radiculopathy, lumbar region: Secondary | ICD-10-CM | POA: Diagnosis not present

## 2017-03-17 MED ORDER — GABAPENTIN 400 MG PO CAPS: 400.0000 mg | ORAL_CAPSULE | Freq: Every day | ORAL | 3 refills | Status: DC

## 2017-03-17 MED ORDER — GABAPENTIN 400 MG PO CAPS: 300.0000 mg | ORAL_CAPSULE | Freq: Every day | ORAL | 3 refills | Status: DC

## 2017-03-17 NOTE — Patient Instructions (Addendum)
Avoid bending, stooping and avoid lifting weights greater than 10 lbs. Avoid prolong standing and walking. Avoid frequent bending and stooping  No lifting greater than 10 lbs. May use ice or moist heat for pain. Weight loss is of benefit. Handicap license is approved. Dr. Romona Curls secretary/Assistant will call to arrange for epidural steroid injection  Will trial a TENS Unit for left L1 radiculopathy Try the brace for one week then half days. Change gabapentin to 400 mg at night.

## 2017-03-17 NOTE — Progress Notes (Signed)
Julie [name]

## 2017-03-17 NOTE — Progress Notes (Signed)
Office Visit Note   Patient: Julie [name]           Date of Birth: 1932-09-05           MRN: [id number] Visit Date: 03/17/2017              Requested by: Abner Greenspan, MD [place], [place] [address] PCP: [name], MD   Assessment & Plan: Visit Diagnoses:  1. Low back pain, unspecified back pain laterality, unspecified chronicity, with sciatica presence unspecified   2. Other secondary scoliosis, thoracolumbar region   3. Radiculopathy, lumbar region   4. Spinal stenosis of lumbar region with neurogenic claudication   5. Leg length discrepancy     Plan:Avoid bending, stooping and avoid lifting weights greater than 10 lbs. Avoid prolong standing and walking. Avoid frequent bending and stooping  No lifting greater than 10 lbs. May use ice or moist heat for pain. Weight loss is of benefit. Handicap license is approved. Dr. Romona Curls secretary/Assistant will call to arrange for epidural steroid injection  Will trial a TENS Unit for left L1 radiculopathy Try the brace for one week then half days.  Change gabapentin to 400 mg at night.  Follow-Up Instructions: Return in about 4 weeks (around 04/14/2017).   Orders:  Orders Placed This Encounter  Procedures  . XR Lumbar Spine 2-3 Views  . Ambulatory referral to Physical Medicine Rehab   No orders of the defined types were placed in this encounter.     Procedures: No procedures performed   Clinical Data: No additional findings.   Subjective: Chief Complaint  Patient presents with  . Lower Back - Pain    83 year old female with 3 week history of left flank and left inguinal pain. Was seen in the ER and evaluated for kidney stones, negative for stone but noted ASPVD of the renal arteries. Pain is sharp with paresthesias. Constipation but no incontinence. Standing and walking she uses a walker since undergoing a left TKR. Has had several falls and has Been seen by Dr. Carles Collet of [facility].  She has neuropathy of the LEs. In past she has had problems with similar pain and underwent left sided facet blocks, MRI has  Not shown any significant nerve compression.    Review of Systems  HENT: Positive for rhinorrhea and sneezing.   Eyes: Negative.   Respiratory: Positive for cough, shortness of breath and wheezing.   Cardiovascular: Negative.   Gastrointestinal: Negative.   Endocrine: Negative.   Genitourinary: Negative.   Musculoskeletal: Positive for back pain, gait problem and myalgias.  Skin: Negative.   Allergic/Immunologic: Negative.   Neurological: Positive for weakness and numbness.  Hematological: Negative.   Psychiatric/Behavioral: The patient is nervous/anxious.      Objective: Vital Signs: BP 130/67 (BP Location: Left Arm, Patient Position: Sitting)   Pulse 75   Ht 5' 3.5" (1.613 m)   Wt 180 lb (81.6 kg)   BMI 31.39 kg/m   Physical Exam  Constitutional: She is oriented to person, place, and time. She appears well-developed and well-nourished.  HENT:  Head: Normocephalic and atraumatic.  Eyes: EOM are normal. Pupils are equal, round, and reactive to light.  Neck: Normal range of motion. Neck supple.  Pulmonary/Chest: Effort normal and breath sounds normal.  Abdominal: Soft. Bowel sounds are normal.  Neurological: She is alert and oriented to person, place, and time.  Skin: Skin is warm and dry.  Psychiatric: She has a normal mood  and affect. Her behavior is normal. Judgment and thought content normal.    Back Exam   Tenderness  The patient is experiencing tenderness in the lumbar.  Range of Motion  Extension: abnormal  Flexion: normal  Lateral Bend Right: abnormal  Lateral Bend Left: abnormal  Rotation Right: abnormal   Muscle Strength  Right Quadriceps:  5/5  Left Quadriceps:  5/5  Right Hamstrings:  5/5  Left Hamstrings:  5/5   Tests  Straight leg raise right: negative  Reflexes  Patellar: normal Achilles: normal Babinski's sign:  normal   Other  Toe Walk: abnormal Heel Walk: abnormal Sensation: decreased Gait: normal  Erythema: no back redness Scars: absent  Comments:  Pain in the left flank and left upper buttock and over the left pelvic iliac wing. Pain into the left inguinal area.       Specialty Comments:  No specialty comments available.  Imaging: Xr Lumbar Spine 2-3 Views  Result Date: 03/17/2017 AP and lateral flexion and extension radiographs of the lumbar and lower dorsal spine show a right apex upper lumbar lower dorsal scoliosis and left lower lumbar scoliosis. There is a right lateral listhesis of L2-3 and anterolisthesis grade 1 at L5-S1 and L4-5 both seen previously and unchanged with bending.     PMFS History: Patient Active Problem List   Diagnosis Date Noted  . Routine general medical examination at a health care facility 08/08/2016  . Left knee pain 02/01/2016  . Total knee replacement status 10/15/2015  . B12 deficiency 07/16/2015  . Peripheral neuropathy 07/16/2015  . 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 Observation after surgery 02/13/2015  . Phalanx fracture, foot 01/10/2015  . Colon cancer screening 09/22/2014  . Osteoarthritis of left lower extremity 09/09/2014  . History of falling 04/26/2014  . Poor balance 04/26/2014  . Left-sided weakness 04/26/2014  . PMR (polymyalgia rheumatica) (HCC) 10/11/2013  . Encounter for Medicare annual wellness exam 07/13/2013  . Risk for falls 07/13/2013  . Skin tag of labia 03/14/2013  . Back pain, thoracic 08/07/2011  . Weakness of left leg 07/30/2011  . BACK PAIN, LUMBAR 12/10/2010  . Vitamin D deficiency 08/16/2010  . GERD 03/26/2010  . DYSPEPSIA 03/26/2010  . IRRITABLE BOWEL SYNDROME 03/26/2010  . Osteoporosis 08/27/2009  . Hypertriglyceridemia 07/24/2009  . DIVERTICULOSIS, COLON 07/24/2009  . OSTEOARTHRITIS 07/24/2009  . DIARRHEA, PERSISTENT 07/24/2009  . COLONIC POLYPS, HX OF 07/24/2009  . POSTMENOPAUSAL STATUS 07/24/2009   Past  Medical History:  Diagnosis Date  . Adenoma   . Chronic leg pain   . Colon polyp   . DDD (degenerative disc disease), cervical   . Diverticulosis of colon    internal----Dr. Vira Agar  . Duodenitis   . Esophagitis   . GERD (gastroesophageal reflux disease)   . Hemorrhoids   . HLD (hyperlipidemia)   . IBS (irritable bowel syndrome)    with PP diarrhea  . Neuropathy   . Neuropathy   . OA (osteoarthritis) of knee   . Osteopenia   . PONV (postoperative nausea and vomiting)   . Scoliosis   . Seborrheic dermatitis   . Shortness of breath dyspnea    with activity  . TMJ syndrome   . Trochanteric bursitis   . Unsteady gait    FALLS EASILY    Family History  Problem Relation Age of Onset  . Colon cancer Mother   . Osteoporosis      hip fracture  . Stroke Brother   . Colon cancer Brother   . Hypertension  Brother   . Other Brother     Heart problem  . Colon cancer Brother   . Colon cancer Maternal Aunt     Past Surgical History:  Procedure Laterality Date  . CARPAL TUNNEL RELEASE Left 01/18/2013   Procedure: CARPAL TUNNEL RELEASE;  Surgeon: Wynonia Sours, MD;  Location: [facility];  Service: Orthopedics;  Laterality: Left;  OSTEOTOMY LEFT DISTAL RADIUS BONE CHIPS CARPAL TUNNEL RELEASE LEFT   . CATARACT EXTRACTION W/PHACO Right 08/07/2015   Procedure: CATARACT EXTRACTION PHACO AND INTRAOCULAR LENS PLACEMENT (IOC);  Surgeon: Birder Robson, MD;  Location: [facility];  Service: Ophthalmology;  Laterality: Right;  [place] 00:48.0 AP   22.3 CDE 10.69 casette lot # [id number]  . CATARACT EXTRACTION W/PHACO Left 09/04/2015   Procedure: CATARACT EXTRACTION PHACO AND INTRAOCULAR LENS PLACEMENT (IOC);  Surgeon: Birder Robson, MD;  Location: [facility];  Service: Ophthalmology;  Laterality: Left;  [place] AP CDE FLUID LOT # [id number]  . CEREBRAL ANEURYSM REPAIR  2001   clamps  . COLONOSCOPY  1/11   polyps-hyperplastic and adenomatous  . FOOT SURGERY  2010   bilateral hammer toes  and "knot"  . FRACTURE SURGERY Right 01/2014   result of a fall  . FRACTURE SURGERY Left 03/2014   result of a fall  . HEMORRHOID SURGERY    . KNEE ARTHROPLASTY Left 10/15/2015   Procedure: COMPUTER ASSISTED TOTAL KNEE ARTHROPLASTY;  Surgeon: Dereck Leep, MD;  Location: [facility];  Service: Orthopedics;  Laterality: Left;  . OPEN REDUCTION INTERNAL FIXATION (ORIF) DISTAL RADIAL FRACTURE Right 02/13/2015   Procedure: OPEN REDUCTION INTERNAL FIXATION (ORIF) RIGHT DISTAL RADIUS;  Surgeon: Daryll Brod, MD;  Location: [facility];  Service: Orthopedics;  Laterality: Right;  . OPEN REDUCTION INTERNAL FIXATION (ORIF) FINGER WITH RADIAL BONE GRAFT Left 11/01/2013   Procedure: OPEN REDUCTION INTERNAL FIXATION (ORIF) LEFT SMALL FINGER;  Surgeon: Wynonia Sours, MD;  Location: [place] [facility];  Service: Orthopedics;  Laterality: Left;  . REPLACEMENT TOTAL KNEE Left   . TOTAL ABDOMINAL HYSTERECTOMY    . WRIST OSTEOTOMY Left 01/18/2013   Procedure: WRIST OSTEOTOMY;  Surgeon: Wynonia Sours, MD;  Location: [place];  Service: Orthopedics;  Laterality: Left;   Social History   Occupational History  . Retired     Radiation protection practitioner   Social History Main Topics  . Smoking status: Former Smoker    Quit date: 10/02/1954  . Smokeless tobacco: Never Used  . Alcohol use No  . Drug use: No  . Sexual activity: No     Comment: smoked alittle as teen

## 2017-03-30 ENCOUNTER — Ambulatory Visit (INDEPENDENT_AMBULATORY_CARE_PROVIDER_SITE_OTHER): Payer: Medicare Other

## 2017-03-30 ENCOUNTER — Ambulatory Visit (INDEPENDENT_AMBULATORY_CARE_PROVIDER_SITE_OTHER): Payer: Medicare Other | Admitting: Physical Medicine and Rehabilitation

## 2017-03-30 ENCOUNTER — Encounter (INDEPENDENT_AMBULATORY_CARE_PROVIDER_SITE_OTHER): Payer: Self-pay | Admitting: Physical Medicine and Rehabilitation

## 2017-03-30 VITALS — BP 146/75 | HR 76 | Temp 97.8°F

## 2017-03-30 DIAGNOSIS — M5416 Radiculopathy, lumbar region: Secondary | ICD-10-CM | POA: Diagnosis not present

## 2017-03-30 MED ORDER — IIOPAMIDOL (ISOVUE-250) INJECTION 51%
3.0000 mL | Freq: Once | INTRAVENOUS | Status: DC
  Filled 2017-03-30: qty 50

## 2017-03-30 MED ORDER — METHYLPREDNISOLONE ACETATE 80 MG/ML IJ SUSP: 40.0000 mg | Freq: Once | INTRAMUSCULAR | Status: DC

## 2017-03-30 MED ORDER — DEXAMETHASONE SODIUM PHOSPHATE 10 MG/ML IJ SOLN: 10.0000 mg | Freq: Once | INTRAMUSCULAR | Status: DC

## 2017-03-30 MED ORDER — LIDOCAINE HCL (PF) 1 % IJ SOLN: 2.0000 mL | Freq: Once | INTRAMUSCULAR | Status: DC

## 2017-03-30 NOTE — Patient Instructions (Signed)

## 2017-03-30 NOTE — Progress Notes (Deleted)
Left side low back pain for around 3 weeks. Constant pain. Worse with walking. Radiates into groin. Denies pain down leg. Ambulating with rolling walker.

## 2017-03-30 NOTE — Progress Notes (Signed)
Julie [name] - 83 y.o. female MRN [id number]  Date of birth: September 18, 1932  Office Visit Note: Visit Date: 03/30/2017 PCP: Julie Pardon, MD Referred by: Tower, [name], MD  Subjective: Chief Complaint  Patient presents with  . Lower Back - Pain   HPI: Julie [name] is a very pleasant 83 year old female who ambulates with a rolling walker and is followed by Dr. Louanne [name]. She comes in today with chronic worsening left-sided low back pain worsening over the last 3 weeks. Radiates into the front of the thigh and the groin area. It's fairly constant but worse with walking. She does not have any pain down the posterior lateral leg. She has no focal weakness. She's had some falls and is very scared not to use the walker. Her spine is very degenerative with significant scoliosis. Dr. Louanne [name] requested a left T12-L1 intralaminar epidural steroid injection right L1 transforaminal injection. He see his notes for further details and justification.    ROS Otherwise per HPI.  Assessment & Plan: Visit Diagnoses:  1. Lumbar radiculopathy     Plan: Findings:  Diagnostic and therapeutic left T12-L1 interlaminar epidural steroid injection right L1 transforaminal epidural steroid injection.    Meds & Orders:  Meds ordered this encounter  Medications  . DISCONTD: lidocaine (PF) (XYLOCAINE) 1 % injection 2 mL  . DISCONTD: iopamidol (ISOVUE-250) 51 % injection 3 mL  . DISCONTD: methylPREDNISolone acetate (DEPO-MEDROL) injection 40 mg  . dexamethasone (DECADRON) injection 10 mg    Orders Placed This Encounter  Procedures  . XR C-ARM NO REPORT  . Epidural Steroid injection    Follow-up: Return for Dr. Louanne [name].   Procedures: No procedures performed  Lumbar Epidural Steroid Injection - Interlaminar Approach with Fluoroscopic Guidance  Patient: Julie [name]      Date of Birth: 1932/09/24 MRN: [id number] PCP: Julie Pardon, MD      Visit Date: 03/30/2017   Universal Protocol:    Date/Time: 05/01/185:30  AM  Consent Given By: the patient  Position: PRONE  Additional Comments: Vital signs were monitored before and after the procedure. Patient was prepped and draped in the usual sterile fashion. The correct patient, procedure, and site was verified.   Injection Procedure Details:  Procedure Site One Meds Administered:  Meds ordered this encounter  Medications  . lidocaine (PF) (XYLOCAINE) 1 % injection 2 mL  . iopamidol (ISOVUE-250) 51 % injection 3 mL  . methylPREDNISolone acetate (DEPO-MEDROL) injection 40 mg  . dexamethasone (DECADRON) injection 10 mg     Laterality: Left  Location/Site:  T12-L1  Needle size: 20 G  Needle type: Tuohy  Needle Placement: Paramedian epidural  Findings:  -Contrast Used: 1 mL iohexol 180 mg iodine/mL   -Comments: Excellent flow of contrast into the epidural space.  Procedure Details: Using a paramedian approach from the side mentioned above, the region overlying the inferior lamina was localized under fluoroscopic visualization and the soft tissues overlying this structure were infiltrated with 4 ml. of 1% Lidocaine without Epinephrine. The Tuohy needle was inserted into the epidural space using a paramedian approach.   The epidural space was localized using loss of resistance along with lateral and bi-planar fluoroscopic views.  After negative aspirate for air, blood, and CSF, a 2 ml. volume of Isovue-250 was injected into the epidural space and the flow of contrast was observed. Radiographs were obtained for documentation purposes.    The injectate was administered into the level noted above.   Additional Comments:  The patient tolerated the  procedure well Dressing: Band-Aid    Post-procedure details: Patient was observed during the procedure. Post-procedure instructions were reviewed.  Patient left the clinic in stable condition. Lumbosacral Transforaminal Epidural Steroid Injection - Infraneural Approach with Fluoroscopic  Guidance  Patient: Julie [name]      Date of Birth: 04/04/1932 MRN: [id number] PCP: Julie Pardon, MD      Visit Date: 03/30/2017   Universal Protocol:    Date/Time: 05/01/185:31 AM  Consent Given By: the patient  Position: PRONE   Additional Comments: Vital signs were monitored before and after the procedure. Patient was prepped and draped in the usual sterile fashion. The correct patient, procedure, and site was verified.   Injection Procedure Details:  Procedure Site One Meds Administered:  Meds ordered this encounter  Medications  . lidocaine (PF) (XYLOCAINE) 1 % injection 2 mL  . iopamidol (ISOVUE-250) 51 % injection 3 mL  . methylPREDNISolone acetate (DEPO-MEDROL) injection 40 mg  . dexamethasone (DECADRON) injection 10 mg      Laterality: Right  Location/Site:  L1-L2  Needle size: 22 G  Needle type: Spinal  Needle Placement: Transforaminal  Findings:  -Contrast Used: 1 mL iohexol 180 mg iodine/mL   -Comments: Excellent flow of contrast along the nerve and into the epidural space.  Procedure Details: After squaring off the end-plates of the desired vertebral level to get a true AP view, the C-arm was obliqued to the painful side so that the superior articulating process is positioned about 1/3 the length of the inferior endplate.  The needle was aimed toward the junction of the superior articular process and the transverse process of the inferior vertebrae. The needle's initial entry is in the lower third of the foramen through Kambin's triangle. The soft tissues overlying this target were infiltrated with 2-3 ml. of 1% Lidocaine without Epinephrine.  The spinal needle was then inserted and advanced toward the target using a "trajectory" view along the fluoroscope beam.  Under AP and lateral visualization, the needle was advanced so it did not puncture dura and did not traverse medially beyond the 6 o'clock position of the pedicle. Bi-planar projections were  used to confirm position. Aspiration was confirmed to be negative for CSF and/or blood. A 1-2 ml. volume of Isovue-250 was injected and flow of contrast was noted at each level. Radiographs were obtained for documentation purposes.   After attaining the desired flow of contrast documented above, a 0.5 to 1.0 ml test dose of 0.25% Marcaine was injected into each respective transforaminal space.  The patient was observed for 90 seconds post injection.  After no sensory deficits were reported, and normal lower extremity motor function was noted,   the above injectate was administered so that equal amounts of the injectate were placed at each foramen (level) into the transforaminal epidural space.   Additional Comments:  The patient tolerated the procedure well Dressing: Band-Aid    Post-procedure details: Patient was observed during the procedure. Post-procedure instructions were reviewed.  Patient left the clinic in stable condition.   Clinical History: AP and lateral lumbar x-ray dated 03/07/2017 AP and lateral flexion and extension radiographs of the lumbar and lower  dorsal spine show a right apex upper lumbar lower dorsal scoliosis and  left lower lumbar scoliosis. There is a right lateral listhesis of L2-3  and anterolisthesis grade 1 at L5-S1 and L4-5 both seen previously and  unchanged with bending.  She reports that she quit smoking about 62 years ago. She has never used smokeless  tobacco. No results for input(s): HGBA1C, LABURIC in the last 8760 hours.  Objective:  VS:  HT:    WT:   BMI:     BP:(!) 146/75  HR:76bpm  TEMP:97.8 F (36.6 C)(Oral)  RESP:99 % Physical Exam  Musculoskeletal:  Patient ambulates with a walker. She has good distal strength.    Ortho Exam Imaging: Xr C-arm No Report  Result Date: 03/30/2017 Please see Notes or Procedures tab for imaging impression.   Past Medical/Family/Surgical/Social History: Medications & Allergies reviewed per  EMR Patient Active Problem List   Diagnosis Date Noted  . Routine general medical examination at a health care facility 08/08/2016  . Left knee pain 02/01/2016  . Total knee replacement status 10/15/2015  . B12 deficiency 07/16/2015  . Peripheral neuropathy 07/16/2015  . 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 Observation after surgery 02/13/2015  . Phalanx fracture, foot 01/10/2015  . Colon cancer screening 09/22/2014  . Osteoarthritis of left lower extremity 09/09/2014  . History of falling 04/26/2014  . Poor balance 04/26/2014  . Left-sided weakness 04/26/2014  . PMR (polymyalgia rheumatica) (HCC) 10/11/2013  . Encounter for Medicare annual wellness exam 07/13/2013  . Risk for falls 07/13/2013  . Skin tag of labia 03/14/2013  . Back pain, thoracic 08/07/2011  . Weakness of left leg 07/30/2011  . BACK PAIN, LUMBAR 12/10/2010  . Vitamin D deficiency 08/16/2010  . GERD 03/26/2010  . DYSPEPSIA 03/26/2010  . IRRITABLE BOWEL SYNDROME 03/26/2010  . Osteoporosis 08/27/2009  . Hypertriglyceridemia 07/24/2009  . DIVERTICULOSIS, COLON 07/24/2009  . OSTEOARTHRITIS 07/24/2009  . DIARRHEA, PERSISTENT 07/24/2009  . COLONIC POLYPS, HX OF 07/24/2009  . POSTMENOPAUSAL STATUS 07/24/2009   Past Medical History:  Diagnosis Date  . Adenoma   . Chronic leg pain   . Colon polyp   . DDD (degenerative disc disease), cervical   . Diverticulosis of colon    internal----Dr. Vira Agar  . Duodenitis   . Esophagitis   . GERD (gastroesophageal reflux disease)   . Hemorrhoids   . HLD (hyperlipidemia)   . IBS (irritable bowel syndrome)    with PP diarrhea  . Neuropathy   . Neuropathy   . OA (osteoarthritis) of knee   . Osteopenia   . PONV (postoperative nausea and vomiting)   . Scoliosis   . Seborrheic dermatitis   . Shortness of breath dyspnea    with activity  . TMJ syndrome   . Trochanteric bursitis   . Unsteady gait    FALLS EASILY   Family History  Problem Relation Age of Onset  . Colon cancer  Mother   . Osteoporosis      hip fracture  . Stroke Brother   . Colon cancer Brother   . Hypertension Brother   . Other Brother     Heart problem  . Colon cancer Brother   . Colon cancer Maternal Aunt    Past Surgical History:  Procedure Laterality Date  . CARPAL TUNNEL RELEASE Left 01/18/2013   Procedure: CARPAL TUNNEL RELEASE;  Surgeon: Wynonia Sours, MD;  Location: [facility];  Service: Orthopedics;  Laterality: Left;  OSTEOTOMY LEFT DISTAL RADIUS BONE CHIPS CARPAL TUNNEL RELEASE LEFT   . CATARACT EXTRACTION W/PHACO Right 08/07/2015   Procedure: CATARACT EXTRACTION PHACO AND INTRAOCULAR LENS PLACEMENT (IOC);  Surgeon: Birder Robson, MD;  Location: [facility];  Service: Ophthalmology;  Laterality: Right;  [place] 00:48.0 AP   22.3 CDE 10.69 casette lot # [id number]  . CATARACT EXTRACTION W/PHACO Left 09/04/2015   Procedure:  CATARACT EXTRACTION PHACO AND INTRAOCULAR LENS PLACEMENT (IOC);  Surgeon: Birder Robson, MD;  Location: [facility];  Service: Ophthalmology;  Laterality: Left;  [place] AP CDE FLUID LOT # [id number]  . CEREBRAL ANEURYSM REPAIR  2001   clamps  . COLONOSCOPY  1/11   polyps-hyperplastic and adenomatous  . FOOT SURGERY  2010   bilateral hammer toes and "knot"  . FRACTURE SURGERY Right 01/2014   result of a fall  . FRACTURE SURGERY Left 03/2014   result of a fall  . HEMORRHOID SURGERY    . KNEE ARTHROPLASTY Left 10/15/2015   Procedure: COMPUTER ASSISTED TOTAL KNEE ARTHROPLASTY;  Surgeon: Dereck Leep, MD;  Location: [facility];  Service: Orthopedics;  Laterality: Left;  . OPEN REDUCTION INTERNAL FIXATION (ORIF) DISTAL RADIAL FRACTURE Right 02/13/2015   Procedure: OPEN REDUCTION INTERNAL FIXATION (ORIF) RIGHT DISTAL RADIUS;  Surgeon: Daryll Brod, MD;  Location: [facility];  Service: Orthopedics;  Laterality: Right;  . OPEN REDUCTION INTERNAL FIXATION (ORIF) FINGER WITH RADIAL BONE GRAFT Left 11/01/2013   Procedure: OPEN REDUCTION INTERNAL FIXATION  (ORIF) LEFT SMALL FINGER;  Surgeon: Wynonia Sours, MD;  Location: [facility];  Service: Orthopedics;  Laterality: Left;  . REPLACEMENT TOTAL KNEE Left   . TOTAL ABDOMINAL HYSTERECTOMY    . WRIST OSTEOTOMY Left 01/18/2013   Procedure: WRIST OSTEOTOMY;  Surgeon: Wynonia Sours, MD;  Location: [facility];  Service: Orthopedics;  Laterality: Left;   Social History   Occupational History  . Retired     Radiation protection practitioner   Social History Main Topics  . Smoking status: Former Smoker    Quit date: 10/02/1954  . Smokeless tobacco: Never Used  . Alcohol use No  . Drug use: No  . Sexual activity: No     Comment: smoked alittle as teen

## 2017-03-31 ENCOUNTER — Encounter (INDEPENDENT_AMBULATORY_CARE_PROVIDER_SITE_OTHER): Payer: Self-pay | Admitting: Physical Medicine and Rehabilitation

## 2017-03-31 NOTE — Procedures (Signed)
Lumbar Epidural Steroid Injection - Interlaminar Approach with Fluoroscopic Guidance  Patient: Julie [name]      Date of Birth: 12-May-1932 MRN: [id number] PCP: [name], MD      Visit Date: 03/30/2017   Universal Protocol:    Date/Time: 05/01/185:30 AM  Consent Given By: the patient  Position: PRONE  Additional Comments: Vital signs were monitored before and after the procedure. Patient was prepped and draped in the usual sterile fashion. The correct patient, procedure, and site was verified.   Injection Procedure Details:  Procedure Site One Meds Administered:  Meds ordered this encounter  Medications  . lidocaine (PF) (XYLOCAINE) 1 % injection 2 mL  . iopamidol (ISOVUE-250) 51 % injection 3 mL  . methylPREDNISolone acetate (DEPO-MEDROL) injection 40 mg  . dexamethasone (DECADRON) injection 10 mg     Laterality: Left  Location/Site:  T12-L1  Needle size: 20 G  Needle type: Tuohy  Needle Placement: Paramedian epidural  Findings:  -Contrast Used: 1 mL iohexol 180 mg iodine/mL   -Comments: Excellent flow of contrast into the epidural space.  Procedure Details: Using a paramedian approach from the side mentioned above, the region overlying the inferior lamina was localized under fluoroscopic visualization and the soft tissues overlying this structure were infiltrated with 4 ml. of 1% Lidocaine without Epinephrine. The Tuohy needle was inserted into the epidural space using a paramedian approach.   The epidural space was localized using loss of resistance along with lateral and bi-planar fluoroscopic views.  After negative aspirate for air, blood, and CSF, a 2 ml. volume of Isovue-250 was injected into the epidural space and the flow of contrast was observed. Radiographs were obtained for documentation purposes.    The injectate was administered into the level noted above.   Additional Comments:  The patient tolerated the procedure well Dressing: Band-Aid     Post-procedure details: Patient was observed during the procedure. Post-procedure instructions were reviewed.  Patient left the clinic in stable condition. Lumbosacral Transforaminal Epidural Steroid Injection - Infraneural Approach with Fluoroscopic Guidance  Patient: Julie [name]      Date of Birth: Apr 21, 1932 MRN: [id number] PCP: [name], MD      Visit Date: 03/30/2017   Universal Protocol:    Date/Time: 05/01/185:31 AM  Consent Given By: the patient  Position: PRONE   Additional Comments: Vital signs were monitored before and after the procedure. Patient was prepped and draped in the usual sterile fashion. The correct patient, procedure, and site was verified.   Injection Procedure Details:  Procedure Site One Meds Administered:  Meds ordered this encounter  Medications  . lidocaine (PF) (XYLOCAINE) 1 % injection 2 mL  . iopamidol (ISOVUE-250) 51 % injection 3 mL  . methylPREDNISolone acetate (DEPO-MEDROL) injection 40 mg  . dexamethasone (DECADRON) injection 10 mg      Laterality: Right  Location/Site:  L1-L2  Needle size: 22 G  Needle type: Spinal  Needle Placement: Transforaminal  Findings:  -Contrast Used: 1 mL iohexol 180 mg iodine/mL   -Comments: Excellent flow of contrast along the nerve and into the epidural space.  Procedure Details: After squaring off the end-plates of the desired vertebral level to get a true AP view, the C-arm was obliqued to the painful side so that the superior articulating process is positioned about 1/3 the length of the inferior endplate.  The needle was aimed toward the junction of the superior articular process and the transverse process of the inferior vertebrae. The needle's initial entry is in  the lower third of the foramen through Kambin's triangle. The soft tissues overlying this target were infiltrated with 2-3 ml. of 1% Lidocaine without Epinephrine.  The spinal needle was then inserted and advanced toward  the target using a "trajectory" view along the fluoroscope beam.  Under AP and lateral visualization, the needle was advanced so it did not puncture dura and did not traverse medially beyond the 6 o'clock position of the pedicle. Bi-planar projections were used to confirm position. Aspiration was confirmed to be negative for CSF and/or blood. A 1-2 ml. volume of Isovue-250 was injected and flow of contrast was noted at each level. Radiographs were obtained for documentation purposes.   After attaining the desired flow of contrast documented above, a 0.5 to 1.0 ml test dose of 0.25% Marcaine was injected into each respective transforaminal space.  The patient was observed for 90 seconds post injection.  After no sensory deficits were reported, and normal lower extremity motor function was noted,   the above injectate was administered so that equal amounts of the injectate were placed at each foramen (level) into the transforaminal epidural space.   Additional Comments:  The patient tolerated the procedure well Dressing: Band-Aid    Post-procedure details: Patient was observed during the procedure. Post-procedure instructions were reviewed.  Patient left the clinic in stable condition.

## 2017-04-16 ENCOUNTER — Ambulatory Visit (INDEPENDENT_AMBULATORY_CARE_PROVIDER_SITE_OTHER): Payer: Self-pay | Admitting: Specialist

## 2017-04-23 ENCOUNTER — Encounter (INDEPENDENT_AMBULATORY_CARE_PROVIDER_SITE_OTHER): Payer: Self-pay

## 2017-04-23 ENCOUNTER — Ambulatory Visit (INDEPENDENT_AMBULATORY_CARE_PROVIDER_SITE_OTHER): Payer: Medicare Other | Admitting: Specialist

## 2017-04-23 ENCOUNTER — Encounter (INDEPENDENT_AMBULATORY_CARE_PROVIDER_SITE_OTHER): Payer: Self-pay | Admitting: Specialist

## 2017-04-23 VITALS — BP 141/80 | HR 84 | Ht 63.0 in | Wt 183.0 lb

## 2017-04-23 DIAGNOSIS — Z5321 Procedure and treatment not carried out due to patient leaving prior to being seen by health care provider: Secondary | ICD-10-CM

## 2017-05-04 ENCOUNTER — Ambulatory Visit (INDEPENDENT_AMBULATORY_CARE_PROVIDER_SITE_OTHER): Payer: Medicare Other | Admitting: Specialist

## 2017-05-04 ENCOUNTER — Encounter (INDEPENDENT_AMBULATORY_CARE_PROVIDER_SITE_OTHER): Payer: Self-pay | Admitting: Specialist

## 2017-05-04 VITALS — BP 147/74 | HR 74 | Ht 63.0 in | Wt 183.0 lb

## 2017-05-04 DIAGNOSIS — G588 Other specified mononeuropathies: Secondary | ICD-10-CM | POA: Diagnosis not present

## 2017-05-04 DIAGNOSIS — M41125 Adolescent idiopathic scoliosis, thoracolumbar region: Secondary | ICD-10-CM | POA: Diagnosis not present

## 2017-05-04 DIAGNOSIS — M5416 Radiculopathy, lumbar region: Secondary | ICD-10-CM

## 2017-05-04 DIAGNOSIS — M4805 Spinal stenosis, thoracolumbar region: Secondary | ICD-10-CM

## 2017-05-04 MED ORDER — GABAPENTIN 400 MG PO CAPS: 400.0000 mg | ORAL_CAPSULE | Freq: Every day | ORAL | 3 refills | Status: DC

## 2017-05-04 NOTE — Patient Instructions (Addendum)
Avoid bending, stooping and avoid lifting weights greater than 10 lbs. Avoid prolong standing and walking. Avoid frequent bending and stooping  No lifting greater than 10 lbs. May use ice or moist heat for pain. Weight loss is of benefit. Handicap license is approved. If pain recurrs call us and we would reschedule further ESI.

## 2017-05-04 NOTE — Progress Notes (Addendum)
Office Visit Note   Patient: Julie [name]           Date of Birth: 12/14/1931           MRN: [id number] Visit Date: 05/04/2017              Requested by: Tower, [name], MD [place], [place] [address] PCP: Abner Greenspan, MD   Assessment & Plan: Visit Diagnoses:  1. Other mononeuropathy   2. Radiculopathy, lumbar region   3. Spinal stenosis of thoracolumbar region   83 year old female with chronic collapsing degenerative scoliosis, not a good candidate for surgical treatment, underwent ESI 03/30/2017 by Dr. Ernestina Patches  With good response. She is not taking any medications for pain.  I will see her back on an as needed basis, she can call if her pain recurrs and we would consider further ESIs right T12-L1, up to 6 injections per year.   Plan: Avoid bending, stooping and avoid lifting weights greater than 10 lbs. Avoid prolong standing and walking. Avoid frequent bending and stooping  No lifting greater than 10 lbs. May use ice or moist heat for pain. Weight loss is of benefit. Handicap license is approved  Follow-Up Instructions: No Follow-up on file.   Orders:  No orders of the defined types were placed in this encounter.  Meds ordered this encounter  Medications  . gabapentin (NEURONTIN) 400 MG capsule    Sig: Take 1 capsule (400 mg total) by mouth at bedtime.    Dispense:  30 capsule    Refill:  3      Procedures: No procedures performed   Clinical Data: No additional findings.   Subjective: Chief Complaint  Patient presents with  . Lower Back - Follow-up    Had Left T12-L1 IL with Dr. Ernestina Patches on 03/30/17     83 year old female with history of thoracolumbar stenosis. She has noted less pain now post T12-L1 right sided ESI. Dr. Ernestina Patches completed the injection 03/30/2017 and she reports that she had nearly complete relief of her pain. Her studies show a collapsing degenerative scoliosis with areas of foramenal entrapment at the thoracolumbar  junction. She has a left foot drop following a left TKR by Dr. Marry Guan of [facility] 10/2015.    Review of Systems  Constitutional: Negative.   HENT: Negative.   Eyes: Negative.   Respiratory: Negative.   Cardiovascular: Negative.   Gastrointestinal: Negative.   Endocrine: Negative.   Genitourinary: Negative.   Musculoskeletal: Negative.   Skin: Negative.   Allergic/Immunologic: Negative.   Neurological: Negative.   Hematological: Negative.   Psychiatric/Behavioral: Negative.      Objective: Vital Signs: BP (!) 147/74 (BP Location: Left Arm, Patient Position: Sitting)   Pulse 74   Ht 5\' 3"  (1.6 m)   Wt 183 lb (83 kg)   BMI 32.42 kg/m   Physical Exam  Constitutional: She is oriented to person, place, and time. She appears well-developed and well-nourished.  HENT:  Head: Normocephalic and atraumatic.  Eyes: EOM are normal. Pupils are equal, round, and reactive to light.  Neck: Normal range of motion. Neck supple.  Pulmonary/Chest: Effort normal and breath sounds normal.  Abdominal: Soft. Bowel sounds are normal.  Musculoskeletal: Normal range of motion.  Neurological: She is alert and oriented to person, place, and time.  Skin: Skin is warm and dry.  Psychiatric: She has a normal mood and affect. Her behavior is normal. Judgment and thought content normal.  Back Exam   Tenderness  The patient is experiencing tenderness in the lumbar.  Muscle Strength  Right Quadriceps:  4/5  Left Quadriceps:  4/5  Right Hamstrings:  4/5   Tests  Straight leg raise right: negative Straight leg raise left: negative  Reflexes  Patellar: 1/4 Achilles: 1/4 Babinski's sign: normal   Other  Toe Walk: abnormal Heel Walk: abnormal  Comments:  Left foot drop. Trace of 5 left foot dorsiflexion strength.       Specialty Comments:  No specialty comments available.  Imaging: No results found.   PMFS History: Patient Active Problem List   Diagnosis Date Noted  .  Routine general medical examination at a health care facility 08/08/2016  . Left knee pain 02/01/2016  . Total knee replacement status 10/15/2015  . B12 deficiency 07/16/2015  . Peripheral neuropathy 07/16/2015  . 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 Observation after surgery 02/13/2015  . Phalanx fracture, foot 01/10/2015  . Colon cancer screening 09/22/2014  . Osteoarthritis of left lower extremity 09/09/2014  . History of falling 04/26/2014  . Poor balance 04/26/2014  . Left-sided weakness 04/26/2014  . PMR (polymyalgia rheumatica) (HCC) 10/11/2013  . Encounter for Medicare annual wellness exam 07/13/2013  . Risk for falls 07/13/2013  . Skin tag of labia 03/14/2013  . Back pain, thoracic 08/07/2011  . Weakness of left leg 07/30/2011  . BACK PAIN, LUMBAR 12/10/2010  . Vitamin D deficiency 08/16/2010  . GERD 03/26/2010  . DYSPEPSIA 03/26/2010  . IRRITABLE BOWEL SYNDROME 03/26/2010  . Osteoporosis 08/27/2009  . Hypertriglyceridemia 07/24/2009  . DIVERTICULOSIS, COLON 07/24/2009  . OSTEOARTHRITIS 07/24/2009  . DIARRHEA, PERSISTENT 07/24/2009  . COLONIC POLYPS, HX OF 07/24/2009  . POSTMENOPAUSAL STATUS 07/24/2009   Past Medical History:  Diagnosis Date  . Adenoma   . Chronic leg pain   . Colon polyp   . DDD (degenerative disc disease), cervical   . Diverticulosis of colon    internal----Dr. Vira Agar  . Duodenitis   . Esophagitis   . GERD (gastroesophageal reflux disease)   . Hemorrhoids   . HLD (hyperlipidemia)   . IBS (irritable bowel syndrome)    with PP diarrhea  . Neuropathy   . Neuropathy   . OA (osteoarthritis) of knee   . Osteopenia   . PONV (postoperative nausea and vomiting)   . Scoliosis   . Seborrheic dermatitis   . Shortness of breath dyspnea    with activity  . TMJ syndrome   . Trochanteric bursitis   . Unsteady gait    FALLS EASILY    Family History  Problem Relation Age of Onset  . Colon cancer Mother   . Osteoporosis Unknown        hip fracture  . Stroke  Brother   . Colon cancer Brother   . Hypertension Brother   . Other Brother        Heart problem  . Colon cancer Brother   . Colon cancer Maternal Aunt     Past Surgical History:  Procedure Laterality Date  . CARPAL TUNNEL RELEASE Left 01/18/2013   Procedure: CARPAL TUNNEL RELEASE;  Surgeon: Wynonia Sours, MD;  Location: [facility];  Service: Orthopedics;  Laterality: Left;  OSTEOTOMY LEFT DISTAL RADIUS BONE CHIPS CARPAL TUNNEL RELEASE LEFT   . CATARACT EXTRACTION W/PHACO Right 08/07/2015   Procedure: CATARACT EXTRACTION PHACO AND INTRAOCULAR LENS PLACEMENT (IOC);  Surgeon: Birder Robson, MD;  Location: [facility];  Service: Ophthalmology;  Laterality: Right;  [place] 00:48.0 AP  22.3 CDE 10.69 casette lot # [id number]  . CATARACT EXTRACTION W/PHACO Left 09/04/2015   Procedure: CATARACT EXTRACTION PHACO AND INTRAOCULAR LENS PLACEMENT (IOC);  Surgeon: Birder Robson, MD;  Location: [facility];  Service: Ophthalmology;  Laterality: Left;  [place] AP CDE FLUID LOT # [id number]  . CEREBRAL ANEURYSM REPAIR  2001   clamps  . COLONOSCOPY  1/11   polyps-hyperplastic and adenomatous  . FOOT SURGERY  2010   bilateral hammer toes and "knot"  . FRACTURE SURGERY Right 01/2014   result of a fall  . FRACTURE SURGERY Left 03/2014   result of a fall  . HEMORRHOID SURGERY    . KNEE ARTHROPLASTY Left 10/15/2015   Procedure: COMPUTER ASSISTED TOTAL KNEE ARTHROPLASTY;  Surgeon: Dereck Leep, MD;  Location: [facility];  Service: Orthopedics;  Laterality: Left;  . OPEN REDUCTION INTERNAL FIXATION (ORIF) DISTAL RADIAL FRACTURE Right 02/13/2015   Procedure: OPEN REDUCTION INTERNAL FIXATION (ORIF) RIGHT DISTAL RADIUS;  Surgeon: Daryll Brod, MD;  Location: [facility];  Service: Orthopedics;  Laterality: Right;  . OPEN REDUCTION INTERNAL FIXATION (ORIF) FINGER WITH RADIAL BONE GRAFT Left 11/01/2013   Procedure: OPEN REDUCTION INTERNAL FIXATION (ORIF) LEFT SMALL FINGER;  Surgeon: Wynonia Sours, MD;   Location: [facility];  Service: Orthopedics;  Laterality: Left;  . REPLACEMENT TOTAL KNEE Left   . TOTAL ABDOMINAL HYSTERECTOMY    . WRIST OSTEOTOMY Left 01/18/2013   Procedure: WRIST OSTEOTOMY;  Surgeon: Wynonia Sours, MD;  Location: [facility];  Service: Orthopedics;  Laterality: Left;   Social History   Occupational History  . Retired     Radiation protection practitioner   Social History Main Topics  . Smoking status: Former Smoker    Quit date: 10/02/1954  . Smokeless tobacco: Never Used  . Alcohol use No  . Drug use: No  . Sexual activity: No     Comment: smoked alittle as teen

## 2017-05-26 NOTE — Progress Notes (Signed)
Julie [name] was seen today in neurologic consultation at the request of Tower, [name], MD.  The consultation is for the evaluation of falls and gait instability.  The records that were made available to me were reviewed.  No one accompanies her back to the room.  Pt states that she has had balance issues for "about a year," although she saw Dr. [name] for the same in 2012.  She states that she is having a lot of falls with no warning beforehand.  She doesn't feel dizzy or lightheaded.  Just about 4-6 weeks ago, she broke her wrist and had to have surgery.  Then a week ago, she fell over the lift chair and she had to have staples in the head.  She estimates that she has a fall a few times a month.  She has used a walker for a few years; she has never had a fall when she was actually using her walker.  Her legs don't feel weak.  No swallowing trouble.  States that her speech has not changed, but her friends all state that she talks "too low."  She has no tremor.  No urinary incontinence but may rarely have an accident.  She denies paresthesias of the feet.  Her sister is 100 years older than she is and she has had some falls as well, but not as frequent as the pt (and states that her sister is blind from macular degeneration).   She denies any abnormal movements, but then says "I think I must be nervous in the hands as they move all the time."  She states that her husband has had to help her dress since 2014, when she broke her left wrist, and she can no longer hook her bra.   She has not driven since aneurysm surgery in 2001 because of peripheral vision loss.    Pt has seen Dr. [name] in 09/2011 for the same and I reviewed his note (only one note I have).  He ordered an MRI of the brain was completed on 10/06/2011.  I only have the report.  It demonstrated mild to moderate small vessel disease and significant metal artifact from a prior aneurysm clipping of the right supraclinoid portion of the  internal carotid artery.  She apparently had an EMG done of the lower extremities that was reported to be unremarkable.  She has been seen at [facility] for her aneurysm and that is where it was clipped in 2001.  She states that she did well after surgery but did lose peripheral vision on the L after surgery.    05/15/15 update:  Pt seen for f/u.  Has had multiple tests done.  EMG demonstrated moderate PN.  MRI brain done at [facility] demonstrating artifact secondary to prior aneurysm clipping and moderate small vessel disease.  She had multiple lab tests done.  Her B12 was a bit low at 262 and she was told to start an oral supplement.  She had other testing including copper, ceruloplasmin, ESR (19), ANA, ferritin, rpr (neg), cpk and aldolase and all were normal.  Celiac panel was normal.  She saw Charlann Boxer and just had a custom orthotic made for mild foot drop.  She has had no falls since last visit.  Is using her walker most of the time.  She does not notice the movements of the right leg.  06/05/15 update:  The patient follows up today unexpectedly.  Last visit, I had given her a form  to have lab work done for chorea of the right leg.  This was an Programme researcher, broadcasting/film/video.  She talked to [place], received the packet and her copay was $250.  She googled HD and wanted to talk further.  States that if there is no cure, she really wants to go no further with testing.  [place] with church and worries about falls/getting on bus.  08/01/16 update:  Pt f/u today at the urging of her ortho surgeon.  I got records from Dr. Lake Bells stating that the patient had a L TKA and he told the pt she needed f/u with me.  Pt states that she is so hesitant now that she cannot get by without her walker.  She is having significant cramping of the toes on the left and of the gastroc, mostly at night but during the day as well.    09/16/16 update:  Pt f/u today.  Has R leg chorea.  Having a lot of cramping and  difficulty walking with L leg post TKA and because of very mild parkinsonism, decided to try levodopa for a few weeks and see how she did with the med.  States that she is only taking one levodopa per day as she "has so much medication to take."  "my toes are still drawing up."  She never tried 3 tablets a day.    Dr. Glori Bickers checked her B12 in august and it was low at 189.  Pt was to be on replacement but hadn't been.  Now on injections.  05/27/17 update:  Pt seen in f/u.  Haven't seen her in about 8 months.  The records that were made available to me were reviewed.  Seen Dr. Louanne Skye and Vance Peper, PA Jefm Bryant), both of orthopedics, since last visit for neuropathic sx's.  While she was seen by Vance Peper, his records indicated that she was not even examined as she noted that she previously had seen Dr. Louanne Skye so he released her.  She did see Dr. Louanne Skye for lumbar spinal stenosis.  Dr. Ernestina Patches did ESI on 03/30/17 which helped.  States that she has not had pain since the day she had the injections.  C/o swelling in the legs.  Been on gabapentin since 01/2016.  States that the dosage increased by Dr. Louanne Skye.  On B12 pills now - no longer on injections.  Asks about the toes curling under.  No cramping.  Just fixed curling of the toes and it is painful.  No falls.  C/o RLS but gabapentin helps but takes 30 min-1 hour to kick in after takes it.  ALLERGIES:   Allergies  Allergen Reactions  . Codeine     REACTION: headache and nausea and vomiting    CURRENT MEDICATIONS:  Outpatient Encounter Prescriptions as of 05/27/2017  Medication Sig  . acetaminophen (TYLENOL) 500 MG tablet Take 500 mg by mouth every 6 (six) hours as needed.  . Alum Hydroxide-Mag Carbonate (GAVISCON EXTRA STRENGTH) 160-105 MG CHEW Chew 1 tablet by mouth as needed (acid reflux).  . carbidopa-levodopa (SINEMET IR) 25-100 MG tablet TAKE 1 TABLET BY MOUTH 3 (THREE) TIMES DAILY.  . cyanocobalamin 1000 MCG tablet Take 1,000 mcg by mouth daily. In am  .  gabapentin (NEURONTIN) 400 MG capsule Take 1 capsule (400 mg total) by mouth at bedtime.  Marland Kitchen loperamide (IMODIUM) 2 MG capsule Take by mouth as needed for diarrhea or loose stools.  . [DISCONTINUED] traMADol (ULTRAM) 50 MG tablet  Take 1 tablet (50 mg total) by mouth every 6 (six) hours as needed.   Facility-Administered Encounter Medications as of 05/27/2017  Medication  . dexamethasone (DECADRON) injection 10 mg    PAST MEDICAL HISTORY:   Past Medical History:  Diagnosis Date  . Adenoma   . Chronic leg pain   . Colon polyp   . DDD (degenerative disc disease), cervical   . Diverticulosis of colon    internal----Dr. Vira Agar  . Duodenitis   . Esophagitis   . GERD (gastroesophageal reflux disease)   . Hemorrhoids   . HLD (hyperlipidemia)   . IBS (irritable bowel syndrome)    with PP diarrhea  . Neuropathy   . Neuropathy   . OA (osteoarthritis) of knee   . Osteopenia   . PONV (postoperative nausea and vomiting)   . Scoliosis   . Seborrheic dermatitis   . Shortness of breath dyspnea    with activity  . TMJ syndrome   . Trochanteric bursitis   . Unsteady gait    FALLS EASILY    PAST SURGICAL HISTORY:   Past Surgical History:  Procedure Laterality Date  . CARPAL TUNNEL RELEASE Left 01/18/2013   Procedure: CARPAL TUNNEL RELEASE;  Surgeon: Wynonia Sours, MD;  Location: [place];  Service: Orthopedics;  Laterality: Left;  OSTEOTOMY LEFT DISTAL RADIUS BONE CHIPS CARPAL TUNNEL RELEASE LEFT   . CATARACT EXTRACTION W/PHACO Right 08/07/2015   Procedure: CATARACT EXTRACTION PHACO AND INTRAOCULAR LENS PLACEMENT (IOC);  Surgeon: Birder Robson, MD;  Location: [facility];  Service: Ophthalmology;  Laterality: Right;  [place] 00:48.0 AP   22.3 CDE 10.69 casette lot # [id number]  . CATARACT EXTRACTION W/PHACO Left 09/04/2015   Procedure: CATARACT EXTRACTION PHACO AND INTRAOCULAR LENS PLACEMENT (IOC);  Surgeon: Birder Robson, MD;  Location: [facility];  Service: Ophthalmology;   Laterality: Left;  [place] AP CDE FLUID LOT # [id number]  . CEREBRAL ANEURYSM REPAIR  2001   clamps  . COLONOSCOPY  1/11   polyps-hyperplastic and adenomatous  . FOOT SURGERY  2010   bilateral hammer toes and "knot"  . FRACTURE SURGERY Right 01/2014   result of a fall  . FRACTURE SURGERY Left 03/2014   result of a fall  . HEMORRHOID SURGERY    . KNEE ARTHROPLASTY Left 10/15/2015   Procedure: COMPUTER ASSISTED TOTAL KNEE ARTHROPLASTY;  Surgeon: Dereck Leep, MD;  Location: [facility];  Service: Orthopedics;  Laterality: Left;  . OPEN REDUCTION INTERNAL FIXATION (ORIF) DISTAL RADIAL FRACTURE Right 02/13/2015   Procedure: OPEN REDUCTION INTERNAL FIXATION (ORIF) RIGHT DISTAL RADIUS;  Surgeon: Daryll Brod, MD;  Location: [place];  Service: Orthopedics;  Laterality: Right;  . OPEN REDUCTION INTERNAL FIXATION (ORIF) FINGER WITH RADIAL BONE GRAFT Left 11/01/2013   Procedure: OPEN REDUCTION INTERNAL FIXATION (ORIF) LEFT SMALL FINGER;  Surgeon: Wynonia Sours, MD;  Location: [place];  Service: Orthopedics;  Laterality: Left;  . REPLACEMENT TOTAL KNEE Left   . TOTAL ABDOMINAL HYSTERECTOMY    . WRIST OSTEOTOMY Left 01/18/2013   Procedure: WRIST OSTEOTOMY;  Surgeon: Wynonia Sours, MD;  Location: [place];  Service: Orthopedics;  Laterality: Left;    SOCIAL HISTORY:   Social History   Social History  . Marital status: Married    Spouse name: N/A  . Number of children: N/A  . Years of education: N/A   Occupational History  . Retired     Radiation protection practitioner   Social History Main Topics  .  Smoking status: Former Smoker    Quit date: 10/02/1954  . Smokeless tobacco: Never Used  . Alcohol use No  . Drug use: No  . Sexual activity: No     Comment: smoked alittle as teen   Other Topics Concern  . Not on file   Social History Narrative   Retired      Married      Regular exercise          FAMILY HISTORY:   Family Status  Relation Status  . Mother  Deceased       colon cancer  . Father Deceased       complications of ?cataract surgery  . Brother Deceased       3, colon CA, COPD  . Sister Deceased       ? CAD  . Sister Alive       2, macular degeneration,   . Child Alive       2, healthy  . Unknown (Not Specified)  . Brother (Not Specified)  . Brother (Not Specified)  . Brother (Not Specified)  . Brother (Not Specified)  . Mat Aunt (Not Specified)    ROS:  A complete 10 system review of systems was obtained and was unremarkable apart from what is mentioned above.  PHYSICAL EXAMINATION:    VITALS:   Vitals:   05/27/17 0847  BP: (!) 142/70  Pulse: 74  Weight: 183 lb 4 oz (83.1 kg)  Height: _0  (1.6 m)     GEN:  Normal appears female in no acute distress.  Appears stated age. CV:  RRR Lungs:  CTAB Neck: no bruits Exts:  Very little swelling (no pitting edema)   NEUROLOGICAL: Orientation:  The patient is alert and oriented x 3.  Cranial nerves: There is good facial symmetry. . Extraocular muscles are intact and there is a left homonymous hemianopsia.   Speech is fluent and clear.   Soft palate rises symmetrically and there is no tongue deviation. Hearing is intact to conversational tone. Tone: Tone is good throughout. Coordination:  The patient has some trouble with finger taps on the L.   Gait and Station: The patient ambulates with a walker.  She does well with this. Abnormal movements: No tremor.  She does have Minimal choreiform-like movement of the right leg that she does not notice (more noticeable on previous visits).  There is no curling of the toes today but does have some hammer toes    Chemistry      Component Value Date/Time   NA 140 02/25/2017 1044   K 3.8 02/25/2017 1044   CL 108 02/25/2017 1044   CO2 25 02/25/2017 1044   BUN 14 02/25/2017 1044   CREATININE 0.87 02/25/2017 1044      Component Value Date/Time   CALCIUM 8.8 (L) 02/25/2017 1044   ALKPHOS 86 02/25/2017 1044   AST 20  02/25/2017 1044   ALT <5 (L) 02/25/2017 1044   BILITOT 0.6 02/25/2017 1044     Lab Results  Component Value Date   VITAMINB12 1,176 (H) 02/03/2017     IMPRESSION/PLAN  1.  Peripheral neuropathy  -Is moderate in nature and certainly could explain her falls in the past.  She has had none recently as she is careful to use walker at all times.   2.  R leg chorea  -suspect ischemic.  MRI with only small vessel disease but significant artifact on it from prior aneurysm clipping.  She refuses further workup  for chorea.  Was very minimal today and hasn't changed over the years 3.  Mild RLS  -take gabapentin about 1 hour before bedtime 4.  LE edema  -told her that gabapentin could contribute but she doesn't wish to d/c that.  Is very minimal on examination  5.  Hammer Toes  -offered referral to podiatry but she opted to hold.  Told her that levodopa wouldn't help this as having no cramping but is a fixed deficit.   6.  B12 deficiency (in past and now supratherapeutic)  -now on oral supplements.   7.  F/u as needed.  Much greater than 50% of this visit was spent in counseling and coordinating care.  Total face to face time:  28 min

## 2017-05-27 ENCOUNTER — Ambulatory Visit (INDEPENDENT_AMBULATORY_CARE_PROVIDER_SITE_OTHER): Payer: Medicare Other | Admitting: Neurology

## 2017-05-27 ENCOUNTER — Encounter: Payer: Self-pay | Admitting: Neurology

## 2017-05-27 VITALS — BP 142/70 | HR 74 | Ht 63.0 in | Wt 183.2 lb

## 2017-05-27 DIAGNOSIS — R600 Localized edema: Secondary | ICD-10-CM

## 2017-05-27 DIAGNOSIS — G2581 Restless legs syndrome: Secondary | ICD-10-CM

## 2017-05-27 DIAGNOSIS — M2040 Other hammer toe(s) (acquired), unspecified foot: Secondary | ICD-10-CM

## 2017-05-27 DIAGNOSIS — G609 Hereditary and idiopathic neuropathy, unspecified: Secondary | ICD-10-CM | POA: Diagnosis not present

## 2017-06-01 ENCOUNTER — Other Ambulatory Visit (INDEPENDENT_AMBULATORY_CARE_PROVIDER_SITE_OTHER): Payer: Self-pay

## 2017-06-01 ENCOUNTER — Telehealth: Payer: Self-pay | Admitting: Neurology

## 2017-06-01 DIAGNOSIS — M41125 Adolescent idiopathic scoliosis, thoracolumbar region: Secondary | ICD-10-CM

## 2017-06-01 DIAGNOSIS — G588 Other specified mononeuropathies: Secondary | ICD-10-CM

## 2017-06-01 DIAGNOSIS — M5416 Radiculopathy, lumbar region: Secondary | ICD-10-CM

## 2017-06-01 DIAGNOSIS — M4805 Spinal stenosis, thoracolumbar region: Secondary | ICD-10-CM

## 2017-06-01 NOTE — Telephone Encounter (Signed)
Patient would like Rx for Gabapentin for 300mg .  Stated that she is having swelling in her legs and feet since she has been taking 400mg  of Gabapentin.  Please advise.  CB# is [phone].

## 2017-06-01 NOTE — Telephone Encounter (Signed)
It may but she probably should ask Dr. Louanne Skye about that since he increased the dosage from 300 to 400mg 

## 2017-06-01 NOTE — Addendum Note (Signed)
Addended by: Minda Ditto, Alyse Low N on: 06/01/2017 05:04 PM   Modules accepted: Orders

## 2017-06-01 NOTE — Telephone Encounter (Signed)
Patient called in- she is wondering if it would help her swelling if she decreased her Gabapentin from 300 mg to 400 mg.  I advised that her neuropathy could get worse if she decreased Gabapentin and swelling was described as minimal in last office note with Dr. Carles Collet.   Dr. Carles Collet please advise.

## 2017-06-01 NOTE — Telephone Encounter (Signed)
[facility] making patient aware of information and to call with any questions.

## 2017-06-01 NOTE — Telephone Encounter (Signed)
Left message on machine for patient to call back.

## 2017-06-04 ENCOUNTER — Telehealth: Payer: Self-pay | Admitting: Neurology

## 2017-06-04 MED ORDER — GABAPENTIN 400 MG PO CAPS: 400.0000 mg | ORAL_CAPSULE | Freq: Every day | ORAL | 3 refills | Status: DC

## 2017-06-04 NOTE — Telephone Encounter (Signed)
Patient called and stated that she was needing to talk with dr tat nurse on the voice mail did not say what it was about

## 2017-06-04 NOTE — Telephone Encounter (Signed)
Spoke with patient. She got our message about contacting prescribing MD about change in dosage of Gabapentin. She states she has not heard back yet. I advised Dr. Carles Collet would not change another doctor's meds and encouraged to keep trying.

## 2017-06-05 ENCOUNTER — Telehealth (INDEPENDENT_AMBULATORY_CARE_PROVIDER_SITE_OTHER): Payer: Self-pay | Admitting: Specialist

## 2017-06-05 ENCOUNTER — Other Ambulatory Visit (INDEPENDENT_AMBULATORY_CARE_PROVIDER_SITE_OTHER): Payer: Self-pay | Admitting: Specialist

## 2017-06-05 MED ORDER — GABAPENTIN 300 MG PO CAPS: 300.0000 mg | ORAL_CAPSULE | Freq: Every day | ORAL | 3 refills | Status: DC

## 2017-06-05 NOTE — Telephone Encounter (Signed)
PT WANTS TO KNOW IF SHE CAN GET A PRESCRIPTION FOR 300MG  OF GABAPENTIN DUE TO 400MG  CAUSING SWELLING.

## 2017-06-05 NOTE — Telephone Encounter (Signed)
PT WANTS TO KNOW IF SHE CAN GET A PRESCRIPTION FOR 300MG  OF GABAPENTIN DUE TO 400MG  CAUSING SWELLING.  [phone]

## 2017-06-05 NOTE — Telephone Encounter (Signed)
Rx for gabapentin 300 mg sent to her pharmacy. jen

## 2017-06-08 NOTE — Telephone Encounter (Signed)
lmom that her new rx was sent in to her pharm

## 2017-06-08 NOTE — Progress Notes (Signed)
lmom that her new rx was sent in to her pharm

## 2017-06-25 DIAGNOSIS — M25562 Pain in left knee: Secondary | ICD-10-CM | POA: Diagnosis not present

## 2017-06-25 DIAGNOSIS — Z96652 Presence of left artificial knee joint: Secondary | ICD-10-CM | POA: Diagnosis not present

## 2017-06-25 DIAGNOSIS — G8929 Other chronic pain: Secondary | ICD-10-CM | POA: Diagnosis not present

## 2017-07-20 DIAGNOSIS — L720 Epidermal cyst: Secondary | ICD-10-CM | POA: Diagnosis not present

## 2017-07-20 DIAGNOSIS — L820 Inflamed seborrheic keratosis: Secondary | ICD-10-CM | POA: Diagnosis not present

## 2017-07-20 DIAGNOSIS — L821 Other seborrheic keratosis: Secondary | ICD-10-CM | POA: Diagnosis not present

## 2017-07-20 DIAGNOSIS — L99 Other disorders of skin and subcutaneous tissue in diseases classified elsewhere: Secondary | ICD-10-CM | POA: Diagnosis not present

## 2017-08-12 ENCOUNTER — Ambulatory Visit: Payer: Self-pay | Admitting: Family Medicine

## 2017-08-14 ENCOUNTER — Ambulatory Visit: Payer: Medicare Other | Admitting: Family Medicine

## 2017-08-24 ENCOUNTER — Encounter: Payer: Self-pay | Admitting: Family Medicine

## 2017-08-24 ENCOUNTER — Ambulatory Visit (INDEPENDENT_AMBULATORY_CARE_PROVIDER_SITE_OTHER): Payer: Medicare Other | Admitting: Family Medicine

## 2017-08-24 VITALS — BP 134/78 | HR 74 | Temp 98.4°F | Ht 60.25 in | Wt 182.5 lb

## 2017-08-24 DIAGNOSIS — M810 Age-related osteoporosis without current pathological fracture: Secondary | ICD-10-CM

## 2017-08-24 DIAGNOSIS — E781 Pure hyperglyceridemia: Secondary | ICD-10-CM

## 2017-08-24 DIAGNOSIS — Z0000 Encounter for general adult medical examination without abnormal findings: Secondary | ICD-10-CM

## 2017-08-24 DIAGNOSIS — Z23 Encounter for immunization: Secondary | ICD-10-CM

## 2017-08-24 DIAGNOSIS — E559 Vitamin D deficiency, unspecified: Secondary | ICD-10-CM | POA: Diagnosis not present

## 2017-08-24 DIAGNOSIS — G20 Parkinson's disease: Secondary | ICD-10-CM | POA: Diagnosis not present

## 2017-08-24 DIAGNOSIS — R197 Diarrhea, unspecified: Secondary | ICD-10-CM

## 2017-08-24 DIAGNOSIS — E538 Deficiency of other specified B group vitamins: Secondary | ICD-10-CM | POA: Diagnosis not present

## 2017-08-24 DIAGNOSIS — K580 Irritable bowel syndrome with diarrhea: Secondary | ICD-10-CM | POA: Diagnosis not present

## 2017-08-24 DIAGNOSIS — Z1231 Encounter for screening mammogram for malignant neoplasm of breast: Secondary | ICD-10-CM | POA: Insufficient documentation

## 2017-08-24 DIAGNOSIS — G255 Other chorea: Secondary | ICD-10-CM | POA: Diagnosis not present

## 2017-08-24 LAB — COMPREHENSIVE METABOLIC PANEL
ALT: 7 U/L (ref 0–35)
AST: 13 U/L (ref 0–37)
Albumin: 4 g/dL (ref 3.5–5.2)
Alkaline Phosphatase: 77 U/L (ref 39–117)
BUN: 8 mg/dL (ref 6–23)
CO2: 25 mEq/L (ref 19–32)
Calcium: 9.3 mg/dL (ref 8.4–10.5)
Chloride: 106 mEq/L (ref 96–112)
Creatinine, Ser: 0.78 mg/dL (ref 0.40–1.20)
GFR: 74.84 mL/min (ref >60.00)
Glucose, Bld: 96 mg/dL (ref 70–99)
Potassium: 3.9 mEq/L (ref 3.5–5.1)
Sodium: 141 mEq/L (ref 135–145)
Total Bilirubin: 0.4 mg/dL (ref 0.2–1.2)
Total Protein: 7.2 g/dL (ref 6.0–8.3)

## 2017-08-24 LAB — LDL CHOLESTEROL, DIRECT: Direct LDL: 104 mg/dL

## 2017-08-24 LAB — LIPID PANEL
Cholesterol: 195 mg/dL (ref 0–200)
HDL: 41.8 mg/dL (ref >39.00)
NonHDL: 153.17
Total CHOL/HDL Ratio: 5
Triglycerides: 251 mg/dL — ABNORMAL HIGH (ref 0.0–149.0)
VLDL: 50.2 mg/dL — ABNORMAL HIGH (ref 0.0–40.0)

## 2017-08-24 LAB — CBC WITH DIFFERENTIAL/PLATELET
Basophils Absolute: 0.1 10*3/uL (ref 0.0–0.1)
Basophils Relative: 1 % (ref 0.0–3.0)
Eosinophils Absolute: 0.1 10*3/uL (ref 0.0–0.7)
Eosinophils Relative: 1.3 % (ref 0.0–5.0)
HCT: 37.4 % (ref 36.0–46.0)
Hemoglobin: 12.4 g/dL (ref 12.0–15.0)
Lymphocytes Relative: 19 % (ref 12.0–46.0)
Lymphs Abs: 1.3 10*3/uL (ref 0.7–4.0)
MCHC: 33.2 g/dL (ref 30.0–36.0)
MCV: 86.1 fl (ref 78.0–100.0)
Monocytes Absolute: 0.5 10*3/uL (ref 0.1–1.0)
Monocytes Relative: 7.3 % (ref 3.0–12.0)
Neutro Abs: 5 10*3/uL (ref 1.4–7.7)
Neutrophils Relative %: 71.4 % (ref 43.0–77.0)
Platelets: 274 10*3/uL (ref 150.0–400.0)
RBC: 4.35 Mil/uL (ref 3.87–5.11)
RDW: 13.7 % (ref 11.5–15.5)
WBC: 7 10*3/uL (ref 4.0–10.5)

## 2017-08-24 LAB — TSH: TSH: 0.95 u[IU]/mL (ref 0.35–4.50)

## 2017-08-24 NOTE — Assessment & Plan Note (Signed)
With arm fx 2013  Pt declines f/u dexa at this visit Disc imp of ca and D Fall prev- always uses walker

## 2017-08-24 NOTE — Assessment & Plan Note (Signed)
Reviewed health habits including diet and exercise and skin cancer prevention Reviewed appropriate screening tests for age  Also reviewed health mt list, fam hx and immunization status , as well as social and family history   See HPI Labs rev  Mammogram rev done  Flu shot today

## 2017-08-24 NOTE — Patient Instructions (Addendum)
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

## 2017-08-24 NOTE — Assessment & Plan Note (Addendum)
Continues 4000 iu daily otc  Disc imp to bone and overall health

## 2017-08-24 NOTE — Assessment & Plan Note (Signed)
Ongoing  Takes simemet Continue f/u with neuro Dr Carles Collet

## 2017-08-24 NOTE — Assessment & Plan Note (Signed)
Scheduled annual screening mammogram Nl breast exam today  Encouraged monthly self exams   

## 2017-08-24 NOTE — Assessment & Plan Note (Signed)
This persists with diarrhea after eating  Per pt GI could not offer her help  This limits lifestyle  She will occ take one immodium prior to going out - this is acceptable with caution  Continue to follow

## 2017-08-24 NOTE — Assessment & Plan Note (Signed)
Pt continues oral supplementation Lab Results  Component Value Date   VITAMINB12 1,176 (H) 02/03/2017

## 2017-08-24 NOTE — Assessment & Plan Note (Signed)
Labs drawn today  Disc diet-pt is not making effort Trig high Disc goals for lipids and reasons to control them Rev labs with pt  (last check) Rev low sat fat diet in detail

## 2017-08-24 NOTE — Progress Notes (Signed)
Subjective:    Patient ID: Julie [name], female    DOB: 1932/07/01, 83 y.o.   MRN: [id number]  HPI  I have personally reviewed the Medicare Annual Wellness questionnaire and have noted 1. The patient's medical and social history 2. Their use of alcohol, tobacco or illicit drugs 3. Their current medications and supplements 4. The patient's functional ability including ADL's, fall risks, home safety risks and hearing or visual             impairment. 5. Diet and physical activities 6. Evidence for depression or mood disorders  The patients weight, height, BMI have been recorded in the chart and visual acuity is per eye clinic.  I have made referrals, counseling and provided education to the patient based review of the above and I have provided the pt with a written personalized care plan for preventive services. Reviewed and updated provider list, see scanned forms.  In general doing about the same Her back is a lot better -saw Dr Louanne Skye / had shots in her back   See scanned forms.  Routine anticipatory guidance given to patient.  See health maintenance. Colon cancer screening colonoscopy 1/11 adenoma  cologaurd neg 10/17  Breast cancer screening mammogram 11/15 nl   (she goes to [facility])  Self breast exam-no lumps  Flu vaccine-today Tetanus vaccine 4/16 Td Pneumovax- up to date  Zoster vaccine zostavax 10/14 dexa 8/14- OP in L femoral neck She is not interested in another one  Falls -none in the past year (using a walker)  Fractures -none new  Vit D level 29.8 in march - she still takes D 4000 iu daily  Advance directive-has both a living will or poa  Cognitive function addressed- see scanned forms- and if abnormal then additional documentation follows. No concerns   PMH and SH reviewed  Eye exam - macular degeneration    Hearing Screening   125Hz  250Hz  500Hz  1000Hz  2000Hz  3000Hz  4000Hz  6000Hz  8000Hz   Right ear:   40 40 40  40    Left ear:   40 40 40  40    Vision  Screening Comments: Completed within the last year per pt   Wt Readings from Last 3 Encounters:  08/24/17 182 lb 8 oz (82.8 kg)  05/27/17 183 lb 4 oz (83.1 kg)  05/04/17 183 lb (83 kg)  she is eating everything she "sees"  Only pleasure she has is watching TV and eating  Not much exercise  Stable wt  35.35 kg/m   Meds, vitals, and allergies reviewed.   ROS: See HPI.  Otherwise negative.    Due for labs today    Hyperlipidemia Lab Results  Component Value Date   CHOL 188 02/03/2017   HDL 43.40 02/03/2017   LDLDIRECT 107.0 02/03/2017   TRIG 257.0 (H) 02/03/2017   CHOLHDL 4 02/03/2017  not watching her diet  Has " a sweet addiction"   B12 def Lab Results  Component Value Date   VITAMINB12 1,176 (H) 02/03/2017    IBS is still very limiting - she has to have bm immediately if she eats  Cannot go on trips/etc   Patient Active Problem List   Diagnosis Date Noted  . Encounter for screening mammogram for breast cancer 08/24/2017  . Routine general medical examination at a health care facility 08/08/2016  . Left knee pain 02/01/2016  . Total knee replacement status 10/15/2015  . B12 deficiency 07/16/2015  . Peripheral neuropathy 07/16/2015  . 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  Observation after surgery 02/13/2015  . Phalanx fracture, foot 01/10/2015  . Colon cancer screening 09/22/2014  . Osteoarthritis of left lower extremity 09/09/2014  . History of falling 04/26/2014  . Poor balance 04/26/2014  . Left-sided weakness 04/26/2014  . PMR (polymyalgia rheumatica) (HCC) 10/11/2013  . Encounter for Medicare annual wellness exam 07/13/2013  . Risk for falls 07/13/2013  . Skin tag of labia 03/14/2013  . Back pain, thoracic 08/07/2011  . Weakness of left leg 07/30/2011  . BACK PAIN, LUMBAR 12/10/2010  . Vitamin D deficiency 08/16/2010  . GERD 03/26/2010  . DYSPEPSIA 03/26/2010  . IRRITABLE BOWEL SYNDROME 03/26/2010  . Osteoporosis 08/27/2009  . Hypertriglyceridemia 07/24/2009    . DIVERTICULOSIS, COLON 07/24/2009  . OSTEOARTHRITIS 07/24/2009  . DIARRHEA, PERSISTENT 07/24/2009  . COLONIC POLYPS, HX OF 07/24/2009  . POSTMENOPAUSAL STATUS 07/24/2009   Past Medical History:  Diagnosis Date  . Adenoma   . Chronic leg pain   . Colon polyp   . DDD (degenerative disc disease), cervical   . Diverticulosis of colon    internal----Dr. Vira Agar  . Duodenitis   . Esophagitis   . GERD (gastroesophageal reflux disease)   . Hemorrhoids   . HLD (hyperlipidemia)   . IBS (irritable bowel syndrome)    with PP diarrhea  . Neuropathy   . Neuropathy   . OA (osteoarthritis) of knee   . Osteopenia   . PONV (postoperative nausea and vomiting)   . Scoliosis   . Seborrheic dermatitis   . Shortness of breath dyspnea    with activity  . TMJ syndrome   . Trochanteric bursitis   . Unsteady gait    FALLS EASILY   Past Surgical History:  Procedure Laterality Date  . CARPAL TUNNEL RELEASE Left 01/18/2013   Procedure: CARPAL TUNNEL RELEASE;  Surgeon: Wynonia Sours, MD;  Location: [place];  Service: Orthopedics;  Laterality: Left;  OSTEOTOMY LEFT DISTAL RADIUS BONE CHIPS CARPAL TUNNEL RELEASE LEFT   . CATARACT EXTRACTION W/PHACO Right 08/07/2015   Procedure: CATARACT EXTRACTION PHACO AND INTRAOCULAR LENS PLACEMENT (IOC);  Surgeon: Birder Robson, MD;  Location: [facility];  Service: Ophthalmology;  Laterality: Right;  [place] 00:48.0 AP   22.3 CDE 10.69 casette lot # [id number]  . CATARACT EXTRACTION W/PHACO Left 09/04/2015   Procedure: CATARACT EXTRACTION PHACO AND INTRAOCULAR LENS PLACEMENT (IOC);  Surgeon: Birder Robson, MD;  Location: [facility];  Service: Ophthalmology;  Laterality: Left;  [place] AP CDE FLUID LOT # [id number]  . CEREBRAL ANEURYSM REPAIR  2001   clamps  . COLONOSCOPY  1/11   polyps-hyperplastic and adenomatous  . FOOT SURGERY  2010   bilateral hammer toes and "knot"  . FRACTURE SURGERY Right 01/2014   result of a fall  . FRACTURE SURGERY Left 03/2014    result of a fall  . HEMORRHOID SURGERY    . KNEE ARTHROPLASTY Left 10/15/2015   Procedure: COMPUTER ASSISTED TOTAL KNEE ARTHROPLASTY;  Surgeon: Dereck Leep, MD;  Location: [facility];  Service: Orthopedics;  Laterality: Left;  . OPEN REDUCTION INTERNAL FIXATION (ORIF) DISTAL RADIAL FRACTURE Right 02/13/2015   Procedure: OPEN REDUCTION INTERNAL FIXATION (ORIF) RIGHT DISTAL RADIUS;  Surgeon: Daryll Brod, MD;  Location: [place];  Service: Orthopedics;  Laterality: Right;  . OPEN REDUCTION INTERNAL FIXATION (ORIF) FINGER WITH RADIAL BONE GRAFT Left 11/01/2013   Procedure: OPEN REDUCTION INTERNAL FIXATION (ORIF) LEFT SMALL FINGER;  Surgeon: Wynonia Sours, MD;  Location: [employer];  Service: Orthopedics;  Laterality: Left;  . REPLACEMENT TOTAL KNEE Left   . TOTAL ABDOMINAL HYSTERECTOMY    . WRIST OSTEOTOMY Left 01/18/2013   Procedure: WRIST OSTEOTOMY;  Surgeon: Wynonia Sours, MD;  Location: [place];  Service: Orthopedics;  Laterality: Left;   Social History  Substance Use Topics  . Smoking status: Former Smoker    Quit date: 10/02/1954  . Smokeless tobacco: Never Used  . Alcohol use No   Family History  Problem Relation Age of Onset  . Colon cancer Mother   . Osteoporosis Unknown        hip fracture  . Stroke Brother   . Colon cancer Brother   . Hypertension Brother   . Other Brother        Heart problem  . Colon cancer Brother   . Colon cancer Maternal Aunt    Allergies  Allergen Reactions  . Codeine     REACTION: headache and nausea and vomiting   Current Outpatient Prescriptions on File Prior to Visit  Medication Sig Dispense Refill  . acetaminophen (TYLENOL) 500 MG tablet Take 500 mg by mouth every 6 (six) hours as needed.    . Alum Hydroxide-Mag Carbonate (GAVISCON EXTRA STRENGTH) 160-105 MG CHEW Chew 1 tablet by mouth as needed (acid reflux).    . carbidopa-levodopa (SINEMET IR) 25-100 MG tablet TAKE 1 TABLET BY MOUTH 3  (THREE) TIMES DAILY. 90 tablet 2  . cyanocobalamin 1000 MCG tablet Take 1,000 mcg by mouth daily. In am    . gabapentin (NEURONTIN) 300 MG capsule Take 1 capsule (300 mg total) by mouth at bedtime. 90 capsule 3  . loperamide (IMODIUM) 2 MG capsule Take by mouth as needed for diarrhea or loose stools.     Current Facility-Administered Medications on File Prior to Visit  Medication Dose Route Frequency Provider Last Rate Last Dose  . dexamethasone (DECADRON) injection 10 mg  10 mg Other Once Magnus Sinning, MD         Review of Systems  Constitutional: Positive for fatigue. Negative for activity change, appetite change, fever and unexpected weight change.  HENT: Negative for congestion, ear pain, rhinorrhea, sinus pressure and sore throat.   Eyes: Positive for visual disturbance. Negative for pain and redness.  Respiratory: Negative for cough, shortness of breath and wheezing.   Cardiovascular: Negative for chest pain and palpitations.  Gastrointestinal: Positive for diarrhea. Negative for abdominal pain, blood in stool, constipation and nausea.  Endocrine: Negative for polydipsia and polyuria.  Genitourinary: Negative for dysuria, frequency and urgency.  Musculoskeletal: Negative for arthralgias, back pain and myalgias.  Skin: Negative for pallor and rash.  Allergic/Immunologic: Negative for environmental allergies.  Neurological: Positive for tremors. Negative for dizziness, syncope and headaches.       Pos for movement disorder   Hematological: Negative for adenopathy. Does not bruise/bleed easily.  Psychiatric/Behavioral: Negative for decreased concentration and dysphoric mood. The patient is not nervous/anxious.        Objective:   Physical Exam  Constitutional: She appears well-developed and well-nourished. No distress.  obese and well appearing   Sitting on walker  She cannot get onto table for exam   HENT:  Head: Normocephalic and atraumatic.  Right Ear: External ear  normal.  Left Ear: External ear normal.  Mouth/Throat: Oropharynx is clear and moist.  Eyes: Pupils are equal, round, and reactive to light. Conjunctivae and EOM are normal. No scleral icterus.  Neck: Normal range of motion. Neck supple. No JVD present. Carotid bruit  is not present. No thyromegaly present.  Cardiovascular: Normal rate, regular rhythm, normal heart sounds and intact distal pulses.  Exam reveals no gallop.   Pulmonary/Chest: Effort normal and breath sounds normal. No respiratory distress. She has no wheezes. She exhibits no tenderness.  Abdominal: Soft. Bowel sounds are normal. She exhibits no distension, no abdominal bruit and no mass. There is no tenderness.  Genitourinary: No breast swelling, tenderness, discharge or bleeding.  Genitourinary Comments: Breast exam done sitting Breast exam: No mass, nodules, thickening, tenderness, bulging, retraction, inflamation, nipple discharge or skin changes noted.  No axillary or clavicular LA.      Musculoskeletal: Normal range of motion. She exhibits no edema or tenderness.  Mild kyphosis   Lymphadenopathy:    She has no cervical adenopathy.  Neurological: She is alert. She has normal reflexes. No cranial nerve deficit. She exhibits normal muscle tone. Coordination normal.  Pt moves her legs throughout exam   Skin: Skin is warm and dry. No rash noted. No erythema. No pallor.  SKs diffusely  Psychiatric: She has a normal mood and affect.          Assessment & Plan:   Problem List Items Addressed This Visit      Digestive   IRRITABLE BOWEL SYNDROME    This persists with diarrhea after eating  Per pt GI could not offer her help  This limits lifestyle  She will occ take one immodium prior to going out - this is acceptable with caution  Continue to follow         Musculoskeletal and Integument   Osteoporosis    With arm fx 2013  Pt declines f/u dexa at this visit Disc imp of ca and D Fall prev- always uses walker          Other   B12 deficiency    Pt continues oral supplementation Lab Results  Component Value Date   VITAMINB12 1,176 (H) 02/03/2017         Encounter for screening mammogram for breast cancer    Scheduled annual screening mammogram Nl breast exam today  Encouraged monthly self exams        Relevant Orders   MM DIGITAL SCREENING BILATERAL   Hypertriglyceridemia    Labs drawn today  Disc diet-pt is not making effort Trig high Disc goals for lipids and reasons to control them Rev labs with pt  (last check) Rev low sat fat diet in detail       Relevant Orders   Comprehensive metabolic panel (Completed)   Lipid panel (Completed)   Routine general medical examination at a health care facility - Primary    Reviewed health habits including diet and exercise and skin cancer prevention Reviewed appropriate screening tests for age  Also reviewed health mt list, fam hx and immunization status , as well as social and family history   See HPI Labs rev  Mammogram rev done  Flu shot today        Relevant Orders   CBC with Differential/Platelet (Completed)   TSH (Completed)   Vitamin D deficiency    Continues 4000 iu daily otc  Disc imp to bone and overall health       Other Visit Diagnoses    Need for influenza vaccination       Relevant Orders   Flu Vaccine QUAD 6+ mos PF IM (Fluarix Quad PF) (Completed)   Diarrhea, unspecified type

## 2017-08-25 ENCOUNTER — Encounter: Payer: Self-pay | Admitting: *Deleted

## 2017-09-02 ENCOUNTER — Ambulatory Visit
Admission: RE | Admit: 2017-09-02 | Discharge: 2017-09-02 | Disposition: A | Discharge status: Home / Self Care | Payer: Medicare Other | Source: Ambulatory Visit | Attending: Family Medicine | Admitting: Family Medicine

## 2017-09-02 DIAGNOSIS — Z1231 Encounter for screening mammogram for malignant neoplasm of breast: Secondary | ICD-10-CM | POA: Insufficient documentation

## 2017-10-13 ENCOUNTER — Telehealth: Payer: Self-pay | Admitting: Family Medicine

## 2017-10-13 NOTE — Telephone Encounter (Signed)
Let me know if they will accept a signature stamp. If not- will have to wait until I return thanks

## 2017-10-13 NOTE — Telephone Encounter (Signed)
Willing to try letter with stamp to see if company accepts it, please write letter and I will print it and stamp it

## 2017-10-13 NOTE — Telephone Encounter (Signed)
Copied from [place] [id number]. Topic: Quick Communication - See Telephone Encounter >> Oct 13, 2017  9:20 AM Ether Griffins B wrote: CRM for notification. See Telephone encounter for:  Pt needing a letter from Dr. Glori Bickers stating it was recommended for her and husband to move to a retirement home. [facility] is wanting to charge them $400 to cancel there services. But stated they would wave the charge if they had a letter with letter head from Dr. Glori Bickers stating it was for health reasons they needed to move.  10/13/17.

## 2017-10-13 NOTE — Telephone Encounter (Signed)
Left voicemail letting pt know letter ready for pick up and if there is an issue with using the stamp to let us know

## 2017-10-13 NOTE — Telephone Encounter (Signed)
I wrote the letter, thanks!

## 2018-03-22 ENCOUNTER — Other Ambulatory Visit (INDEPENDENT_AMBULATORY_CARE_PROVIDER_SITE_OTHER): Payer: Self-pay | Admitting: Specialist

## 2018-03-22 NOTE — Telephone Encounter (Signed)
Gabapentin Refill Request

## 2018-06-25 ENCOUNTER — Encounter (INDEPENDENT_AMBULATORY_CARE_PROVIDER_SITE_OTHER): Payer: Self-pay | Admitting: Specialist

## 2018-06-25 NOTE — Progress Notes (Signed)
Left without being seen, visit rescheduled. jen

## 2018-06-25 NOTE — Patient Instructions (Signed)
Left without being seen. jen

## 2018-08-31 ENCOUNTER — Encounter: Payer: Self-pay | Admitting: Family Medicine

## 2018-08-31 ENCOUNTER — Encounter (INDEPENDENT_AMBULATORY_CARE_PROVIDER_SITE_OTHER): Payer: Self-pay

## 2018-08-31 ENCOUNTER — Ambulatory Visit: Payer: Medicare Other | Admitting: Family Medicine

## 2018-08-31 VITALS — BP 116/72 | HR 81 | Temp 98.8°F | Ht 60.25 in | Wt 184.2 lb

## 2018-08-31 DIAGNOSIS — G20 Parkinson's disease: Secondary | ICD-10-CM | POA: Diagnosis not present

## 2018-08-31 DIAGNOSIS — Z23 Encounter for immunization: Secondary | ICD-10-CM | POA: Diagnosis not present

## 2018-08-31 DIAGNOSIS — R238 Other skin changes: Secondary | ICD-10-CM | POA: Diagnosis not present

## 2018-08-31 NOTE — Progress Notes (Signed)
Subjective:    Patient ID: Julie [name], female    DOB: [birth year], 84 y.o.   MRN: [id number]  HPI Here for rash on her neck  After church on Sunday - her family noticed her neck - redness front and back  Did not wear a necklace that day  She went home  Her top was stained purple around neck   She did use a perfume on neck -has never caused a problem Not colored   ? If she hugged someone with makeup   She put cortisone cream otc on it  Felt like a little burning sensation  Not itchy at all  Has not come back at all   Did have some people hug her in church   Patient Active Problem List   Diagnosis Date Noted  . Change of skin color 08/31/2018  . Encounter for screening mammogram for breast cancer 08/24/2017  . Parkinsonism ([place]) 08/24/2017  . Routine general medical examination at a health care facility 08/08/2016  . Left knee pain 02/01/2016  . Total knee replacement status 10/15/2015  . B12 deficiency 07/16/2015  . Peripheral neuropathy 07/16/2015  . 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 Observation after surgery 02/13/2015  . Phalanx fracture, foot 01/10/2015  . Colon cancer screening 09/22/2014  . Osteoarthritis of left lower extremity 09/09/2014  . History of falling 04/26/2014  . Poor balance 04/26/2014  . Left-sided weakness 04/26/2014  . PMR (polymyalgia rheumatica) (HCC) 10/11/2013  . Encounter for Medicare annual wellness exam 07/13/2013  . Risk for falls 07/13/2013  . Skin tag of labia 03/14/2013  . Back pain, thoracic 08/07/2011  . Weakness of left leg 07/30/2011  . BACK PAIN, LUMBAR 12/10/2010  . Vitamin D deficiency 08/16/2010  . GERD 03/26/2010  . DYSPEPSIA 03/26/2010  . IRRITABLE BOWEL SYNDROME 03/26/2010  . Osteoporosis 08/27/2009  . Hypertriglyceridemia 07/24/2009  . DIVERTICULOSIS, COLON 07/24/2009  . OSTEOARTHRITIS 07/24/2009  . COLONIC POLYPS, HX OF 07/24/2009  . POSTMENOPAUSAL STATUS 07/24/2009   Past Medical History:  Diagnosis Date  . Adenoma    . Chronic leg pain   . Colon polyp   . DDD (degenerative disc disease), cervical   . Diverticulosis of colon    internal----Dr. Vira Agar  . Duodenitis   . Esophagitis   . GERD (gastroesophageal reflux disease)   . Hemorrhoids   . HLD (hyperlipidemia)   . IBS (irritable bowel syndrome)    with PP diarrhea  . Neuropathy   . Neuropathy   . OA (osteoarthritis) of knee   . Osteopenia   . PONV (postoperative nausea and vomiting)   . Scoliosis   . Seborrheic dermatitis   . Shortness of breath dyspnea    with activity  . TMJ syndrome   . Trochanteric bursitis   . Unsteady gait    FALLS EASILY   Past Surgical History:  Procedure Laterality Date  . CARPAL TUNNEL RELEASE Left 01/18/2013   Procedure: CARPAL TUNNEL RELEASE;  Surgeon: Wynonia Sours, MD;  Location: [place];  Service: Orthopedics;  Laterality: Left;  OSTEOTOMY LEFT DISTAL RADIUS BONE CHIPS CARPAL TUNNEL RELEASE LEFT   . CATARACT EXTRACTION W/PHACO Right 08/07/2015   Procedure: CATARACT EXTRACTION PHACO AND INTRAOCULAR LENS PLACEMENT (IOC);  Surgeon: Birder Robson, MD;  Location: [facility];  Service: Ophthalmology;  Laterality: Right;  [place] 00:48.0 AP   22.3 CDE 10.69 casette lot # [id number]  . CATARACT EXTRACTION W/PHACO Left 09/04/2015   Procedure: CATARACT EXTRACTION PHACO AND INTRAOCULAR LENS PLACEMENT (IOC);  Surgeon: Birder Robson, MD;  Location: [facility];  Service: Ophthalmology;  Laterality: Left;  [place] AP CDE FLUID LOT # [id number]  . CEREBRAL ANEURYSM REPAIR  2001   clamps  . COLONOSCOPY  1/11   polyps-hyperplastic and adenomatous  . FOOT SURGERY  2010   bilateral hammer toes and "knot"  . FRACTURE SURGERY Right 01/2014   result of a fall  . FRACTURE SURGERY Left 03/2014   result of a fall  . HEMORRHOID SURGERY    . KNEE ARTHROPLASTY Left 10/15/2015   Procedure: COMPUTER ASSISTED TOTAL KNEE ARTHROPLASTY;  Surgeon: Dereck Leep, MD;  Location: [facility];  Service: Orthopedics;  Laterality: Left;   . OPEN REDUCTION INTERNAL FIXATION (ORIF) DISTAL RADIAL FRACTURE Right 02/13/2015   Procedure: OPEN REDUCTION INTERNAL FIXATION (ORIF) RIGHT DISTAL RADIUS;  Surgeon: Daryll Brod, MD;  Location: [place];  Service: Orthopedics;  Laterality: Right;  . OPEN REDUCTION INTERNAL FIXATION (ORIF) FINGER WITH RADIAL BONE GRAFT Left 11/01/2013   Procedure: OPEN REDUCTION INTERNAL FIXATION (ORIF) LEFT SMALL FINGER;  Surgeon: Wynonia Sours, MD;  Location: [place];  Service: Orthopedics;  Laterality: Left;  . REPLACEMENT TOTAL KNEE Left   . TOTAL ABDOMINAL HYSTERECTOMY    . WRIST OSTEOTOMY Left 01/18/2013   Procedure: WRIST OSTEOTOMY;  Surgeon: Wynonia Sours, MD;  Location: [place];  Service: Orthopedics;  Laterality: Left;   Social History   Tobacco Use  . Smoking status: Former Smoker    Last attempt to quit: 10/02/1954    Years since quitting: 63.9  . Smokeless tobacco: Never Used  Substance Use Topics  . Alcohol use: No    Alcohol/week: 0.0 standard drinks  . Drug use: No   Family History  Problem Relation Age of Onset  . Colon cancer Mother   . Osteoporosis Unknown        hip fracture  . Stroke Brother   . Colon cancer Brother   . Hypertension Brother   . Other Brother        Heart problem  . Colon cancer Brother   . Colon cancer Maternal Aunt   . Breast cancer Neg Hx    Allergies  Allergen Reactions  . Codeine     REACTION: headache and nausea and vomiting   Current Outpatient Medications on File Prior to Visit  Medication Sig Dispense Refill  . acetaminophen (TYLENOL) 500 MG tablet Take 500 mg by mouth every 6 (six) hours as needed.    . Alum Hydroxide-Mag Carbonate (GAVISCON EXTRA STRENGTH) 160-105 MG CHEW Chew 1 tablet by mouth as needed (acid reflux).    . carbidopa-levodopa (SINEMET IR) 25-100 MG tablet TAKE 1 TABLET BY MOUTH 3 (THREE) TIMES DAILY. 90 tablet 2  . cyanocobalamin 1000 MCG tablet Take 1,000 mcg by mouth daily. In  am    . gabapentin (NEURONTIN) 300 MG capsule TAKE ONE CAPSULE BY MOUTH AT BEDTIME 90 capsule 3  . loperamide (IMODIUM) 2 MG capsule Take by mouth as needed for diarrhea or loose stools.     Current Facility-Administered Medications on File Prior to Visit  Medication Dose Route Frequency Provider Last Rate Last Dose  . dexamethasone (DECADRON) injection 10 mg  10 mg Other Once Magnus Sinning, MD        Review of Systems  Constitutional: Negative for activity change, appetite change, fatigue, fever and unexpected weight change.  HENT: Negative for congestion, ear pain, rhinorrhea, sinus pressure and sore throat.   Eyes:  Negative for pain, redness and visual disturbance.  Respiratory: Negative for cough, shortness of breath and wheezing.   Cardiovascular: Negative for chest pain and palpitations.  Gastrointestinal: Negative for abdominal pain, blood in stool, constipation and diarrhea.  Endocrine: Negative for polydipsia and polyuria.  Genitourinary: Negative for dysuria, frequency and urgency.  Musculoskeletal: Negative for arthralgias, back pain and myalgias.  Skin: Negative for pallor and rash.       Had skin discoloration / ? Rash on neck Now resolved No itching   Allergic/Immunologic: Negative for environmental allergies.  Neurological: Negative for dizziness, syncope and headaches.  Hematological: Negative for adenopathy. Does not bruise/bleed easily.  Psychiatric/Behavioral: Negative for decreased concentration and dysphoric mood. The patient is not nervous/anxious.        Objective:   Physical Exam  Constitutional: She appears well-developed and well-nourished. No distress.  obese and well appearing   HENT:  Head: Normocephalic and atraumatic.  Mouth/Throat: Oropharynx is clear and moist.  Eyes: Pupils are equal, round, and reactive to light. Conjunctivae and EOM are normal. Right eye exhibits no discharge. Left eye exhibits no discharge. No scleral icterus.  Neck: Normal  range of motion. Neck supple. No JVD present.  Cardiovascular: Normal rate, regular rhythm and normal heart sounds.  Pulmonary/Chest: Effort normal and breath sounds normal. No stridor. No respiratory distress. She has no wheezes. She has no rales.  Musculoskeletal: She exhibits no edema.  Lymphadenopathy:    She has no cervical adenopathy.  Neurological: She is alert. No cranial nerve deficit.  Sitting on walker  Able to ambulate with it w/o assistance today  Tremor too slight to notice   Skin: Skin is warm and dry. No rash noted. No erythema. No pallor.  No rash  Neck appears normal  Many angiomas and SKs baseline  No change in color of skin  Psychiatric: She has a normal mood and affect.  Pleasant           Assessment & Plan:   Problem List Items Addressed This Visit      Nervous and Auditory   Parkinsonism ([place])    Continues neurologic f/u  Using walker at all times simemet tid -tolerates well         Other   Change of skin color - Primary    Discoloration (red) of neck (front and back) after church on Sunday -noticed that day only  Resolved totally with cortisone cream  Noted a purple discoloration around entire collar of top she wore that day (not blood)- appears to be some kind of dye  Unsure what she was exp to  (perhaps from someone who hugged her at church?) No rash today  Looked at her perfume-no color to that either   Adv pt to avoid harsh chemicals Stop cortisone cream Alert [place] if the condition returns        Other Visit Diagnoses    Need for influenza vaccination       Relevant Orders   Flu Vaccine QUAD 6+ mos PF IM (Fluarix Quad PF) (Completed)

## 2018-08-31 NOTE — Patient Instructions (Signed)
For now = avoid extra products on your neck including scents (for a little while)   Stop the cortisone cream and let me know if redness returns   Avoid harsh detergents  I like dove soap for sensitive skin for cleansing   Keep me posted

## 2018-08-31 NOTE — Assessment & Plan Note (Signed)
Continues neurologic f/u  Using walker at all times simemet tid -tolerates well

## 2018-08-31 NOTE — Assessment & Plan Note (Signed)
Discoloration (red) of neck (front and back) after church on Sunday -noticed that day only  Resolved totally with cortisone cream  Noted a purple discoloration around entire collar of top she wore that day (not blood)- appears to be some kind of dye  Unsure what she was exp to  (perhaps from someone who hugged her at church?) No rash today  Looked at her perfume-no color to that either   Adv pt to avoid harsh chemicals Stop cortisone cream Alert [place] if the condition returns

## 2018-11-08 ENCOUNTER — Telehealth: Payer: Self-pay

## 2018-11-08 NOTE — Telephone Encounter (Signed)
For 2 days pt has had runny nose, non prod cough, chest congestion, no fever or wheezing. Pt not resting well at night due to cough.Offered pt appt 11/08/18 but pt could not come today. Pt scheduled appt 11/09/18 at 3 PM with Dr Glori Bickers and if condition worsens prior to appt pt will go to [facility]. FYI to Dr Glori Bickers.

## 2018-11-08 NOTE — Telephone Encounter (Signed)
I will see her then  

## 2018-11-09 ENCOUNTER — Encounter: Payer: Self-pay | Admitting: Family Medicine

## 2018-11-09 ENCOUNTER — Ambulatory Visit (INDEPENDENT_AMBULATORY_CARE_PROVIDER_SITE_OTHER): Payer: Medicare Other | Admitting: Family Medicine

## 2018-11-09 VITALS — BP 136/78 | HR 81 | Temp 98.3°F | Resp 30 | Ht 60.25 in | Wt 180.0 lb

## 2018-11-09 DIAGNOSIS — B9789 Other viral agents as the cause of diseases classified elsewhere: Secondary | ICD-10-CM | POA: Diagnosis not present

## 2018-11-09 DIAGNOSIS — J069 Acute upper respiratory infection, unspecified: Secondary | ICD-10-CM | POA: Diagnosis not present

## 2018-11-09 MED ORDER — BENZONATATE 200 MG PO CAPS: 200.0000 mg | ORAL_CAPSULE | Freq: Three times a day (TID) | ORAL | 1 refills | Status: DC | PRN

## 2018-11-09 MED ORDER — PROMETHAZINE-DM 6.25-15 MG/5ML PO SYRP: 5.0000 mL | ORAL_SOLUTION | Freq: Every evening | ORAL | 0 refills | Status: DC | PRN

## 2018-11-09 NOTE — Patient Instructions (Signed)
Try to get some rest  Drink lots of fluids   Nasal saline spray may help nasal congestion   Try tessalon for cough three times daily - swallow pill whole   prometh DM cough medicine - use at night for cough in addition to the tessalon (it is sedating)- use caution   Update if not starting to improve in a week or if worsening   Watch for worsening productive cough/ fever/shortness of breath or facial pain

## 2018-11-09 NOTE — Progress Notes (Signed)
Subjective:    Patient ID: Julie [name], female    DOB: 1932/02/01, 84 y.o.   MRN: [id number]  HPI Here with symptoms of runny nose and cough   Coughing all night  Non productive cough  Hears congestion up high in throat  pnd -worse to lie down  Head hurts - feels tight (not face)  Nasal congestion is clear No fever   No sore throat  Ears are fine   3 days of symptoms   Otc: mucinex (unsure what type- for night time?)  BCs helps headache   Patient Active Problem List   Diagnosis Date Noted  . Viral URI with cough 11/09/2018  . Change of skin color 08/31/2018  . Encounter for screening mammogram for breast cancer 08/24/2017  . Parkinsonism ([place]) 08/24/2017  . Routine general medical examination at a health care facility 08/08/2016  . Left knee pain 02/01/2016  . Total knee replacement status 10/15/2015  . B12 deficiency 07/16/2015  . Peripheral neuropathy 07/16/2015  . 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 Observation after surgery 02/13/2015  . Phalanx fracture, foot 01/10/2015  . Colon cancer screening 09/22/2014  . Osteoarthritis of left lower extremity 09/09/2014  . History of falling 04/26/2014  . Poor balance 04/26/2014  . Left-sided weakness 04/26/2014  . Encounter for Medicare annual wellness exam 07/13/2013  . Risk for falls 07/13/2013  . Skin tag of labia 03/14/2013  . Back pain, thoracic 08/07/2011  . Weakness of left leg 07/30/2011  . BACK PAIN, LUMBAR 12/10/2010  . Vitamin D deficiency 08/16/2010  . GERD 03/26/2010  . DYSPEPSIA 03/26/2010  . IRRITABLE BOWEL SYNDROME 03/26/2010  . Osteoporosis 08/27/2009  . Hypertriglyceridemia 07/24/2009  . DIVERTICULOSIS, COLON 07/24/2009  . OSTEOARTHRITIS 07/24/2009  . COLONIC POLYPS, HX OF 07/24/2009  . POSTMENOPAUSAL STATUS 07/24/2009   Past Medical History:  Diagnosis Date  . Adenoma   . Chronic leg pain   . Colon polyp   . DDD (degenerative disc disease), cervical   . Diverticulosis of colon    internal----Dr.  Vira Agar  . Duodenitis   . Esophagitis   . GERD (gastroesophageal reflux disease)   . Hemorrhoids   . HLD (hyperlipidemia)   . IBS (irritable bowel syndrome)    with PP diarrhea  . Neuropathy   . Neuropathy   . OA (osteoarthritis) of knee   . Osteopenia   . PONV (postoperative nausea and vomiting)   . Scoliosis   . Seborrheic dermatitis   . Shortness of breath dyspnea    with activity  . TMJ syndrome   . Trochanteric bursitis   . Unsteady gait    FALLS EASILY   Past Surgical History:  Procedure Laterality Date  . CARPAL TUNNEL RELEASE Left 01/18/2013   Procedure: CARPAL TUNNEL RELEASE;  Surgeon: Wynonia Sours, MD;  Location: [place];  Service: Orthopedics;  Laterality: Left;  OSTEOTOMY LEFT DISTAL RADIUS BONE CHIPS CARPAL TUNNEL RELEASE LEFT   . CATARACT EXTRACTION W/PHACO Right 08/07/2015   Procedure: CATARACT EXTRACTION PHACO AND INTRAOCULAR LENS PLACEMENT (IOC);  Surgeon: Birder Robson, MD;  Location: [facility];  Service: Ophthalmology;  Laterality: Right;  [place] 00:48.0 AP   22.3 CDE 10.69 casette lot # [id number]  . CATARACT EXTRACTION W/PHACO Left 09/04/2015   Procedure: CATARACT EXTRACTION PHACO AND INTRAOCULAR LENS PLACEMENT (IOC);  Surgeon: Birder Robson, MD;  Location: [facility];  Service: Ophthalmology;  Laterality: Left;  [place] AP CDE FLUID LOT # [id number]  . CEREBRAL ANEURYSM REPAIR  2001  clamps  . COLONOSCOPY  1/11   polyps-hyperplastic and adenomatous  . FOOT SURGERY  2010   bilateral hammer toes and "knot"  . FRACTURE SURGERY Right 01/2014   result of a fall  . FRACTURE SURGERY Left 03/2014   result of a fall  . HEMORRHOID SURGERY    . KNEE ARTHROPLASTY Left 10/15/2015   Procedure: COMPUTER ASSISTED TOTAL KNEE ARTHROPLASTY;  Surgeon: Dereck Leep, MD;  Location: [facility];  Service: Orthopedics;  Laterality: Left;  . OPEN REDUCTION INTERNAL FIXATION (ORIF) DISTAL RADIAL FRACTURE Right 02/13/2015   Procedure: OPEN REDUCTION INTERNAL FIXATION  (ORIF) RIGHT DISTAL RADIUS;  Surgeon: Daryll Brod, MD;  Location: [place];  Service: Orthopedics;  Laterality: Right;  . OPEN REDUCTION INTERNAL FIXATION (ORIF) FINGER WITH RADIAL BONE GRAFT Left 11/01/2013   Procedure: OPEN REDUCTION INTERNAL FIXATION (ORIF) LEFT SMALL FINGER;  Surgeon: Wynonia Sours, MD;  Location: [place];  Service: Orthopedics;  Laterality: Left;  . REPLACEMENT TOTAL KNEE Left   . TOTAL ABDOMINAL HYSTERECTOMY    . WRIST OSTEOTOMY Left 01/18/2013   Procedure: WRIST OSTEOTOMY;  Surgeon: Wynonia Sours, MD;  Location: [place];  Service: Orthopedics;  Laterality: Left;   Social History   Tobacco Use  . Smoking status: Former Smoker    Last attempt to quit: 10/02/1954    Years since quitting: 64.1  . Smokeless tobacco: Never Used  Substance Use Topics  . Alcohol use: No    Alcohol/week: 0.0 standard drinks  . Drug use: No   Family History  Problem Relation Age of Onset  . Colon cancer Mother   . Osteoporosis Unknown        hip fracture  . Stroke Brother   . Colon cancer Brother   . Hypertension Brother   . Other Brother        Heart problem  . Colon cancer Brother   . Colon cancer Maternal Aunt   . Breast cancer Neg Hx    Allergies  Allergen Reactions  . Codeine     REACTION: headache and nausea and vomiting   Current Outpatient Medications on File Prior to Visit  Medication Sig Dispense Refill  . acetaminophen (TYLENOL) 500 MG tablet Take 500 mg by mouth every 6 (six) hours as needed.    . Alum Hydroxide-Mag Carbonate (GAVISCON EXTRA STRENGTH) 160-105 MG CHEW Chew 1 tablet by mouth as needed (acid reflux).    . carbidopa-levodopa (SINEMET IR) 25-100 MG tablet TAKE 1 TABLET BY MOUTH 3 (THREE) TIMES DAILY. 90 tablet 2  . cyanocobalamin 1000 MCG tablet Take 1,000 mcg by mouth daily. In am    . gabapentin (NEURONTIN) 300 MG capsule TAKE ONE CAPSULE BY MOUTH AT BEDTIME 90 capsule 3  . loperamide (IMODIUM) 2 MG  capsule Take by mouth as needed for diarrhea or loose stools.     Current Facility-Administered Medications on File Prior to Visit  Medication Dose Route Frequency Provider Last Rate Last Dose  . dexamethasone (DECADRON) injection 10 mg  10 mg Other Once Magnus Sinning, MD         Review of Systems  Constitutional: Positive for appetite change and fatigue. Negative for chills, diaphoresis and fever.  HENT: Positive for congestion, postnasal drip, rhinorrhea, sinus pressure, sneezing and sore throat. Negative for ear pain.   Eyes: Negative for pain and discharge.  Respiratory: Positive for cough. Negative for shortness of breath, wheezing and stridor.   Cardiovascular: Negative for chest pain.  Gastrointestinal: Negative for diarrhea, nausea and vomiting.  Genitourinary: Negative for frequency, hematuria and urgency.  Musculoskeletal: Negative for arthralgias and myalgias.  Skin: Negative for rash.  Neurological: Positive for headaches. Negative for dizziness, weakness and light-headedness.  Psychiatric/Behavioral: Negative for confusion and dysphoric mood.       Objective:   Physical Exam  Constitutional: She appears well-developed and well-nourished. No distress.  obese and well appearing   HENT:  Head: Normocephalic and atraumatic.  Right Ear: External ear normal.  Left Ear: External ear normal.  Mouth/Throat: Oropharynx is clear and moist.  Nares are injected and congested  No sinus tenderness Clear rhinorrhea and post nasal drip   Eyes: Pupils are equal, round, and reactive to light. Conjunctivae and EOM are normal. Right eye exhibits no discharge. Left eye exhibits no discharge. No scleral icterus.  Neck: Normal range of motion. Neck supple.  Cardiovascular: Normal rate, regular rhythm and normal heart sounds.  Pulmonary/Chest: Effort normal and breath sounds normal. No respiratory distress. She has no wheezes. She has no rales. She exhibits no tenderness.  Good air  exch No rales/rhonchi No wheeze even on forced exp  Lymphadenopathy:    She has no cervical adenopathy.  Neurological: She is alert. No cranial nerve deficit.  Skin: Skin is warm and dry. No rash noted.  Psychiatric: She has a normal mood and affect.  Pleasant           Assessment & Plan:   Problem List Items Addressed This Visit      Respiratory   Viral URI with cough - Primary    3 days Reassuring exam  Disc symptomatic care - see instructions on AVS  Tessalon and prometh DM (warned of sedation) px  Fluids/rest  Expectorant prn Update if not starting to improve in a week or if worsening

## 2018-11-09 NOTE — Assessment & Plan Note (Signed)
3 days Reassuring exam  Disc symptomatic care - see instructions on AVS  Tessalon and prometh DM (warned of sedation) px  Fluids/rest  Expectorant prn Update if not starting to improve in a week or if worsening

## 2018-11-17 DIAGNOSIS — H353132 Nonexudative age-related macular degeneration, bilateral, intermediate dry stage: Secondary | ICD-10-CM | POA: Diagnosis not present

## 2018-11-30 ENCOUNTER — Other Ambulatory Visit: Payer: Self-pay

## 2018-11-30 MED ORDER — PROMETHAZINE-DM 6.25-15 MG/5ML PO SYRP: 5.0000 mL | ORAL_SOLUTION | Freq: Every evening | ORAL | 0 refills | Status: DC | PRN

## 2018-11-30 NOTE — Telephone Encounter (Signed)
This has been on back order in most places Please check with the pharmacy- if they have it, ok to refill  Thanks  If no further improvement please f/u

## 2018-11-30 NOTE — Telephone Encounter (Signed)
Pt seen 11/09/18 and pts non prod cough is better but pt still coughing at night and request refill on promethazine DM to [facility].last filled #[id number] on 11/09/18.Please advise.

## 2018-11-30 NOTE — Telephone Encounter (Signed)
Spoken to [facility] pharmacy and was told that they have enough of this medication.  Per DPR, left detail message of Dr Marliss Coots comments for patient.

## 2019-04-15 ENCOUNTER — Ambulatory Visit: Payer: Self-pay

## 2019-04-19 ENCOUNTER — Other Ambulatory Visit (INDEPENDENT_AMBULATORY_CARE_PROVIDER_SITE_OTHER): Payer: Self-pay | Admitting: Specialist

## 2019-04-19 NOTE — Telephone Encounter (Signed)
Gabapentin refill request 

## 2019-04-20 ENCOUNTER — Encounter: Payer: Self-pay | Admitting: Family Medicine

## 2019-06-13 ENCOUNTER — Telehealth: Payer: Self-pay

## 2019-06-13 NOTE — Telephone Encounter (Signed)
Pt left v/m has ulcer in mouth; left v/m for pt to cb [facility] for appt.

## 2019-06-17 ENCOUNTER — Ambulatory Visit: Payer: Medicare Other | Admitting: Family Medicine

## 2019-08-10 DIAGNOSIS — M2042 Other hammer toe(s) (acquired), left foot: Secondary | ICD-10-CM | POA: Diagnosis not present

## 2019-08-10 DIAGNOSIS — M79672 Pain in left foot: Secondary | ICD-10-CM | POA: Diagnosis not present

## 2019-08-11 ENCOUNTER — Ambulatory Visit: Payer: Medicare Other

## 2019-08-15 ENCOUNTER — Telehealth: Payer: Self-pay | Admitting: Family Medicine

## 2019-08-15 DIAGNOSIS — E559 Vitamin D deficiency, unspecified: Secondary | ICD-10-CM

## 2019-08-15 DIAGNOSIS — M810 Age-related osteoporosis without current pathological fracture: Secondary | ICD-10-CM

## 2019-08-15 DIAGNOSIS — E538 Deficiency of other specified B group vitamins: Secondary | ICD-10-CM

## 2019-08-15 DIAGNOSIS — Z0000 Encounter for general adult medical examination without abnormal findings: Secondary | ICD-10-CM

## 2019-08-15 DIAGNOSIS — E781 Pure hyperglyceridemia: Secondary | ICD-10-CM

## 2019-08-15 NOTE — Telephone Encounter (Signed)
-----   Message from Ellamae Sia sent at 08/09/2019 10:01 AM EDT ----- Regarding: Lab orders for Tuesday, 9.15.20 Patient is scheduled for CPX labs, please order future labs, Thanks , Karna Christmas

## 2019-08-16 ENCOUNTER — Other Ambulatory Visit: Payer: Medicare Other

## 2019-08-23 ENCOUNTER — Encounter: Payer: Medicare Other | Admitting: Family Medicine

## 2020-01-13 ENCOUNTER — Other Ambulatory Visit (INDEPENDENT_AMBULATORY_CARE_PROVIDER_SITE_OTHER): Payer: Self-pay | Admitting: Specialist

## 2020-02-03 ENCOUNTER — Ambulatory Visit: Payer: Medicare Other | Admitting: Family Medicine

## 2020-02-08 ENCOUNTER — Other Ambulatory Visit: Payer: Self-pay

## 2020-02-08 ENCOUNTER — Ambulatory Visit: Payer: Medicare Other | Admitting: Family Medicine

## 2020-02-08 ENCOUNTER — Encounter: Payer: Self-pay | Admitting: Family Medicine

## 2020-02-08 VITALS — BP 128/60 | HR 65 | Temp 97.8°F | Ht 60.25 in | Wt 179.0 lb

## 2020-02-08 DIAGNOSIS — R8290 Unspecified abnormal findings in urine: Secondary | ICD-10-CM

## 2020-02-08 DIAGNOSIS — R350 Frequency of micturition: Secondary | ICD-10-CM | POA: Diagnosis not present

## 2020-02-08 DIAGNOSIS — Z636 Dependent relative needing care at home: Secondary | ICD-10-CM | POA: Diagnosis not present

## 2020-02-08 DIAGNOSIS — M48061 Spinal stenosis, lumbar region without neurogenic claudication: Secondary | ICD-10-CM

## 2020-02-08 DIAGNOSIS — G20 Parkinson's disease: Secondary | ICD-10-CM

## 2020-02-08 LAB — POC URINALSYSI DIPSTICK (AUTOMATED)
Bilirubin, UA: 2
Blood, UA: NEGATIVE
Glucose, UA: NEGATIVE
Ketones, UA: 5
Leukocytes, UA: NEGATIVE
Nitrite, UA: POSITIVE
Protein, UA: POSITIVE — AB
Spec Grav, UA: >=1.03 — AB (ref 1.010–1.025)
Urobilinogen, UA: 0.2 E.U./dL
pH, UA: 5.5 (ref 5.0–8.0)

## 2020-02-08 MED ORDER — PREDNISONE 10 MG PO TABS
ORAL_TABLET | ORAL | 0 refills | Status: DC
Start: 2020-02-08 — End: 2020-05-14

## 2020-02-08 NOTE — Progress Notes (Signed)
Subjective:    Patient ID: Julie [name], female    DOB: July 24, 1932, 86 y.o.   MRN: [id number]  This visit occurred during the SARS-CoV-2 public health emergency.  Safety protocols were in place, including screening questions prior to the visit, additional usage of staff PPE, and extensive cleaning of exam room while observing appropriate contact time as indicated for disinfecting solutions.    HPI Pt presents with back pain (acute on chronic)  Wt Readings from Last 3 Encounters:  02/08/20 179 lb (81.2 kg)  11/09/18 180 lb (81.6 kg)  08/31/18 184 lb 4 oz (83.6 kg)   34.67 kg/m    H/o thoracolumbar scoliosis and deg disc dz /spinal stenosis  She does urinate a lot day and night  Incontinence (urge) for several  No urine blood or odor  No dysuria  occ burns a bit when she has to urinate (then better)   Pain is in L low back - (upper low back)  Hurts to get up  Hurts to stand and walk as well (notices it after she walks a while) Sharp in nature  Pain radiates up to thoracic area  No numbness  No tingling except for baseline neuropathy   Walks with adjusted gait due to her knee surgery and L sided weakness    Has never done PT for back (just her knee)     Stress- husband has dementia  Has some home care for personal care (out of pocket) and they do the driving   Baseline- parkinsons and neuropathy with L sided weakness   Last MR of LS  MR Lumbar Spine Wo Contrast (Accession [id number]) (Order [id number]) Imaging Date: 08/03/2011 Department: [facility] Released By: Erline Levine Authorizing: Jak Haggar, [name], MD  Exam Status  Status  Final [99]  PACS Intelerad Image Link  Show images for MR Lumbar Spine Wo Contrast  Study Result  *RADIOLOGY REPORT*  Clinical Data: Chronic low back pain and degenerative disc disease. Weakness in the left leg.  MRI LUMBAR SPINE WITHOUT CONTRAST  Technique:  Multiplanar and multiecho  pulse sequences of the lumbar spine were obtained without intravenous contrast.  Comparison: Two-view lumbar spine radiographs 12/10/2010.  Findings: Normal signal is present in the lower thoracic lumbar spine to the conus medullaris which terminates at L1-2.  Scoliosis of the thoracolumbar spine is again noted, convex to the right at L1 and to the left at L5.  Slight degenerative retrolisthesis is present at each level T11-12 through L2-3.  Anterolisthesis at L5- S1 measures 3 mm.  Endplate marrow changes are present at T11-12 with slight focal kyphosis at the level.  There is a Schmorl's node.  Right-sided end plate marrow changes are present at L3-4 and minimally at L4-5.  Limited imaging of the abdomen is unremarkable.  The individual disc levels are as follows.  T11-12:  There is slight uncovering of the disc.  Minimal retrolisthesis is present.  There is no significant stenosis.  T12-L1:  Minimal disc bulge is present.  There is no significant stenosis.  L1-2:  Mild facet hypertrophy is noted on the left.  Slight retrolisthesis is evident.  There is no significant stenosis.  L2-3:  Mild disc bulging is present.  Slight facet hypertrophy is evident.  No significant stenosis is present.  L3-4:  Mild facet hypertrophy is present.  This is worse on the right.  Minimal disc bulging is present.  No significant stenosis is present.  L4-5:  Minimal  disc bulging facet hypertrophy is evident.  There is no significant stenosis.  L5-S1:  Slight uncovering of the disc is noted.  There is no significant herniation or stenosis.  IMPRESSION:  1.  Moderate rotatory scoliosis. 2.  Multilevel facet degenerative change.  No focal compressive stenosis is evident.  Original Report Authenticated By: Resa Miner. MATTERN, M.D.   She has seen Dr Louanne Skye in the past  Has had epidural steroid injections  A/p from 2018 from his note: Assessment & Plan: Visit Diagnoses:    1. Other mononeuropathy   2. Radiculopathy, lumbar region   3. Spinal stenosis of thoracolumbar region   86 year old female with chronic collapsing degenerative scoliosis, not a good candidate for surgical treatment, underwent ESI 03/30/2017 by Dr. Ernestina Patches  With good response. She is not taking any medications for pain.  I will see her back on an as needed basis, she can call if her pain recurrs and we would consider further ESIs right T12-L1, up to 6 injections per year.   ua today is concentrated Results for orders placed or performed in visit on 02/08/20  POCT Urinalysis Dipstick (Automated)  Result Value Ref Range   Color, UA Amber    Clarity, UA Cloudy    Glucose, UA Negative Negative   Bilirubin, UA 2 mg/dL    Ketones, UA 5 mg/dL    Spec Grav, UA >=1.030 (A) 1.010 - 1.025   Blood, UA Negative    pH, UA 5.5 5.0 - 8.0   Protein, UA Positive (A) Negative   Urobilinogen, UA 0.2 0.2 or 1.0 E.U./dL   Nitrite, UA Positive    Leukocytes, UA Negative Negative   sent for culture   Patient Active Problem List   Diagnosis Date Noted  . Spinal stenosis of lumbar region 02/08/2020  . Caregiver stress 02/08/2020  . Frequent urination 02/08/2020  . Change of skin color 08/31/2018  . Encounter for screening mammogram for breast cancer 08/24/2017  . Parkinsonism ([place]) 08/24/2017  . Routine general medical examination at a health care facility 08/08/2016  . Left knee pain 02/01/2016  . Total knee replacement status 10/15/2015  . B12 deficiency 07/16/2015  . Peripheral neuropathy 07/16/2015  . 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 Observation after surgery 02/13/2015  . Phalanx fracture, foot 01/10/2015  . Colon cancer screening 09/22/2014  . Osteoarthritis of left lower extremity 09/09/2014  . History of falling 04/26/2014  . Poor balance 04/26/2014  . Left-sided weakness 04/26/2014  . Encounter for Medicare annual wellness exam 07/13/2013  . Risk for falls 07/13/2013  . Skin tag of labia 03/14/2013   . Back pain, thoracic 08/07/2011  . Weakness of left leg 07/30/2011  . BACK PAIN, LUMBAR 12/10/2010  . Vitamin D deficiency 08/16/2010  . GERD 03/26/2010  . DYSPEPSIA 03/26/2010  . IRRITABLE BOWEL SYNDROME 03/26/2010  . Osteoporosis 08/27/2009  . Hypertriglyceridemia 07/24/2009  . DIVERTICULOSIS, COLON 07/24/2009  . OSTEOARTHRITIS 07/24/2009  . COLONIC POLYPS, HX OF 07/24/2009  . POSTMENOPAUSAL STATUS 07/24/2009   Past Medical History:  Diagnosis Date  . Adenoma   . Chronic leg pain   . Colon polyp   . DDD (degenerative disc disease), cervical   . Diverticulosis of colon    internal----Dr. Vira Agar  . Duodenitis   . Esophagitis   . GERD (gastroesophageal reflux disease)   . Hemorrhoids   . HLD (hyperlipidemia)   . IBS (irritable bowel syndrome)    with PP diarrhea  . Neuropathy   . Neuropathy   .  OA (osteoarthritis) of knee   . Osteopenia   . PONV (postoperative nausea and vomiting)   . Scoliosis   . Seborrheic dermatitis   . Shortness of breath dyspnea    with activity  . TMJ syndrome   . Trochanteric bursitis   . Unsteady gait    FALLS EASILY   Past Surgical History:  Procedure Laterality Date  . CARPAL TUNNEL RELEASE Left 01/18/2013   Procedure: CARPAL TUNNEL RELEASE;  Surgeon: Wynonia Sours, MD;  Location: [facility];  Service: Orthopedics;  Laterality: Left;  OSTEOTOMY LEFT DISTAL RADIUS BONE CHIPS CARPAL TUNNEL RELEASE LEFT   . CATARACT EXTRACTION W/PHACO Right 08/07/2015   Procedure: CATARACT EXTRACTION PHACO AND INTRAOCULAR LENS PLACEMENT (IOC);  Surgeon: Birder Robson, MD;  Location: [facility];  Service: Ophthalmology;  Laterality: Right;  [place] 00:48.0 AP   22.3 CDE 10.69 casette lot # [id number]  . CATARACT EXTRACTION W/PHACO Left 09/04/2015   Procedure: CATARACT EXTRACTION PHACO AND INTRAOCULAR LENS PLACEMENT (IOC);  Surgeon: Birder Robson, MD;  Location: [facility];  Service: Ophthalmology;  Laterality: Left;  [place] AP CDE FLUID LOT #  [id number]  . CEREBRAL ANEURYSM REPAIR  2001   clamps  . COLONOSCOPY  1/11   polyps-hyperplastic and adenomatous  . FOOT SURGERY  2010   bilateral hammer toes and "knot"  . FRACTURE SURGERY Right 01/2014   result of a fall  . FRACTURE SURGERY Left 03/2014   result of a fall  . HEMORRHOID SURGERY    . KNEE ARTHROPLASTY Left 10/15/2015   Procedure: COMPUTER ASSISTED TOTAL KNEE ARTHROPLASTY;  Surgeon: Dereck Leep, MD;  Location: [facility];  Service: Orthopedics;  Laterality: Left;  . OPEN REDUCTION INTERNAL FIXATION (ORIF) DISTAL RADIAL FRACTURE Right 02/13/2015   Procedure: OPEN REDUCTION INTERNAL FIXATION (ORIF) RIGHT DISTAL RADIUS;  Surgeon: Daryll Brod, MD;  Location: [facility];  Service: Orthopedics;  Laterality: Right;  . OPEN REDUCTION INTERNAL FIXATION (ORIF) FINGER WITH RADIAL BONE GRAFT Left 11/01/2013   Procedure: OPEN REDUCTION INTERNAL FIXATION (ORIF) LEFT SMALL FINGER;  Surgeon: Wynonia Sours, MD;  Location: [facility];  Service: Orthopedics;  Laterality: Left;  . REPLACEMENT TOTAL KNEE Left   . TOTAL ABDOMINAL HYSTERECTOMY    . WRIST OSTEOTOMY Left 01/18/2013   Procedure: WRIST OSTEOTOMY;  Surgeon: Wynonia Sours, MD;  Location: [facility];  Service: Orthopedics;  Laterality: Left;   Social History   Tobacco Use  . Smoking status: Former Smoker    Quit date: 10/02/1954    Years since quitting: 65.3  . Smokeless tobacco: Never Used  Substance Use Topics  . Alcohol use: No    Alcohol/week: 0.0 standard drinks  . Drug use: No   Family History  Problem Relation Age of Onset  . Colon cancer Mother   . Osteoporosis Unknown        hip fracture  . Stroke Brother   . Colon cancer Brother   . Hypertension Brother   . Other Brother        Heart problem  . Colon cancer Brother   . Colon cancer Maternal Aunt   . Breast cancer Neg Hx    Allergies  Allergen Reactions  . Codeine     REACTION: headache and nausea and vomiting    Current Outpatient Medications on File Prior to Visit  Medication Sig Dispense Refill  . acetaminophen (TYLENOL) 500 MG tablet Take 500 mg by mouth every 6 (six) hours as needed.    Marland Kitchen  Alum Hydroxide-Mag Carbonate (GAVISCON EXTRA STRENGTH) 160-105 MG CHEW Chew 1 tablet by mouth as needed (acid reflux).    . carbidopa-levodopa (SINEMET IR) 25-100 MG tablet TAKE 1 TABLET BY MOUTH 3 (THREE) TIMES DAILY. 90 tablet 2  . cyanocobalamin 1000 MCG tablet Take 1,000 mcg by mouth daily. In am    . gabapentin (NEURONTIN) 300 MG capsule TAKE 1 CAPSULE BY MOUTH AT BEDTIME 90 capsule 3  . loperamide (IMODIUM) 2 MG capsule Take by mouth as needed for diarrhea or loose stools.     Current Facility-Administered Medications on File Prior to Visit  Medication Dose Route Frequency Provider Last Rate Last Admin  . dexamethasone (DECADRON) injection 10 mg  10 mg Other Once Magnus Sinning, MD        Review of Systems  Constitutional: Negative for activity change, appetite change, fatigue, fever and unexpected weight change.  HENT: Negative for congestion, ear pain, rhinorrhea, sinus pressure and sore throat.   Eyes: Negative for pain, redness and visual disturbance.  Respiratory: Negative for cough, shortness of breath and wheezing.   Cardiovascular: Negative for chest pain and palpitations.  Gastrointestinal: Negative for abdominal pain, blood in stool, constipation and diarrhea.  Endocrine: Negative for polydipsia and polyuria.  Genitourinary: Positive for frequency and urgency. Negative for dysuria, hematuria and pelvic pain.  Musculoskeletal: Positive for arthralgias, back pain and gait problem. Negative for joint swelling and myalgias.  Skin: Negative for pallor and rash.  Allergic/Immunologic: Negative for environmental allergies.  Neurological: Negative for dizziness, syncope and headaches.  Hematological: Negative for adenopathy. Does not bruise/bleed easily.  Psychiatric/Behavioral: Negative for  decreased concentration and dysphoric mood. The patient is nervous/anxious.        Objective:   Physical Exam Constitutional:      General: She is not in acute distress.    Appearance: Normal appearance. She is well-developed. She is obese. She is not ill-appearing.  HENT:     Head: Normocephalic and atraumatic.  Eyes:     General: No scleral icterus.    Conjunctiva/sclera: Conjunctivae normal.     Pupils: Pupils are equal, round, and reactive to light.  Neck:     Thyroid: No thyromegaly.     Vascular: No carotid bruit or JVD.  Cardiovascular:     Rate and Rhythm: Normal rate and regular rhythm.     Heart sounds: Normal heart sounds. No gallop.   Pulmonary:     Effort: Pulmonary effort is normal. No respiratory distress.     Breath sounds: Normal breath sounds. No wheezing or rales.  Abdominal:     General: Bowel sounds are normal. There is no distension or abdominal bruit.     Palpations: Abdomen is soft. There is no mass.     Tenderness: There is no abdominal tenderness. There is no right CVA tenderness or left CVA tenderness.     Hernia: No hernia is present.     Comments: No suprapubic tenderness or fullness    Musculoskeletal:     Cervical back: Normal range of motion and neck supple.     Lumbar back: Spasms, tenderness and bony tenderness present. No swelling, edema or deformity. Decreased range of motion. Negative right straight leg raise test and negative left straight leg raise test. Scoliosis present.     Right lower leg: No edema.     Left lower leg: No edema.     Comments: Baseline thoracoscoliosis  Tender over upper LS vertebrae and also L lumbar musculature with spasm  Limited  rom (flex 30 deg/ any extension)  Mobility is limited by pain and parkinsons  No neuro signs today   Lymphadenopathy:     Cervical: No cervical adenopathy.  Skin:    General: Skin is warm and dry.     Findings: No rash.  Neurological:     Mental Status: She is alert.     Sensory:  Sensation is intact.     Motor: Weakness present. No tremor.     Deep Tendon Reflexes: Reflexes are normal and symmetric.     Comments: Baseline weakness of LEs worse on the L  No tremor  Neg slr for leg pain (some back pain with L)   No foot drop    Psychiatric:        Mood and Affect: Mood is anxious.        Cognition and Memory: Cognition and memory normal.     Comments: Seems fatigued and mildly anxious            Assessment & Plan:   Problem List Items Addressed This Visit      Nervous and Auditory   Parkinsonism ([place])    Continues neuro f/u  Fairly stable on simemet         Other   Spinal stenosis of lumbar region - Primary    With L sided lumbar pain today- flaring recently  She is trying to care for husband and do some housework  Rev last notes from Dr Louanne Skye with pt in detail  Has had epidural injections (last 2018)  She would like to avoid this if able Px prednisone tape 30 mg (warned of side effects)  Acetaminophen and heat prn  Update if not starting to improve in a week or if worsening   Will f/u with ortho if no imp      Caregiver stress    Caring for husband with alz dementia  Having some anxiety on and off  Unsure how long she will be able to keep him home As hired help fortunately      Frequent urination    ua is concentrated and with nitrates cx sent  Strongly enc more water intake  Suspect overactive bladder      Relevant Orders   POCT Urinalysis Dipstick (Automated) (Completed)   Urine Culture    Other Visit Diagnoses    Abnormal urinalysis       Relevant Orders   Urine Culture

## 2020-02-08 NOTE — Assessment & Plan Note (Signed)
ua is concentrated and with nitrates cx sent  Strongly enc more water intake  Suspect overactive bladder

## 2020-02-08 NOTE — Patient Instructions (Addendum)
I think your back pain is from your spinal stenosis  Let's try a course of prednisone  If not effective I suggest following up with Dr Louanne Skye   Let's also get a urine sample on the way out to make sure there is no infection    (since you urinate frequently)   Keep walking  Heat for 10 minutes at a time may help your back

## 2020-02-08 NOTE — Assessment & Plan Note (Signed)
Continues neuro f/u  Fairly stable on simemet

## 2020-02-08 NOTE — Assessment & Plan Note (Signed)
Caring for husband with alz dementia  Having some anxiety on and off  Unsure how long she will be able to keep him home As hired help fortunately

## 2020-02-08 NOTE — Assessment & Plan Note (Signed)
With L sided lumbar pain today- flaring recently  She is trying to care for husband and do some housework  Rev last notes from Dr Louanne Skye with pt in detail  Has had epidural injections (last 2018)  She would like to avoid this if able Px prednisone tape 30 mg (warned of side effects)  Acetaminophen and heat prn  Update if not starting to improve in a week or if worsening   Will f/u with ortho if no imp

## 2020-02-10 LAB — URINE CULTURE
MICRO NUMBER:: 10236082
SPECIMEN QUALITY:: ADEQUATE

## 2020-02-12 ENCOUNTER — Other Ambulatory Visit: Payer: Self-pay | Admitting: Family Medicine

## 2020-02-12 NOTE — Telephone Encounter (Signed)
u cx grew klebsiella  I pended keflex to go to pharmacy of choice   Let me know if no improvement re: urinary frequency

## 2020-02-13 MED ORDER — CEPHALEXIN 500 MG PO CAPS: 500.0000 mg | ORAL_CAPSULE | Freq: Two times a day (BID) | ORAL | 0 refills | Status: DC

## 2020-02-13 NOTE — Telephone Encounter (Signed)
Pt notified of urine cx results and Dr. Marliss Coots instructions. Rx sent to pharmacy

## 2020-04-11 ENCOUNTER — Ambulatory Visit (INDEPENDENT_AMBULATORY_CARE_PROVIDER_SITE_OTHER): Payer: Medicare Other

## 2020-04-11 VITALS — Ht 63.5 in | Wt 177.0 lb

## 2020-04-11 DIAGNOSIS — Z0000 Encounter for general adult medical examination without abnormal findings: Secondary | ICD-10-CM | POA: Diagnosis not present

## 2020-04-11 NOTE — Progress Notes (Signed)
PCP notes:  Health Maintenance: Mammogram- due   Abnormal Screenings: PHQ 9 score- 6    Patient concerns: Persistent low back pain    Nurse concerns: none   Next PCP appt.: none

## 2020-04-11 NOTE — Progress Notes (Signed)
Subjective:   Julie [name] is a 86 y.o. female who presents for Medicare Annual (Subsequent) preventive examination.  Review of Systems: N/A   This visit is being conducted through telemedicine via telephone at the nurse health advisor's home address due to the COVID-19 pandemic. This patient has given me verbal consent via doximity to conduct this visit, patient states they are participating from their home address. Patient and myself are on the telephone call. There is no referral for this visit. Some vital signs may be absent or patient reported.    Patient identification: identified by name, DOB, and current address   Cardiac Risk Factors include: advanced age (>56men, >54 women)     Objective:     Vitals: Ht 5' 3.5" (1.613 m)   Wt 177 lb (80.3 kg)   BMI 30.86 kg/m   Body mass index is 30.86 kg/m.  Advanced Directives 04/11/2020 08/24/2017 02/25/2017 07/25/2016 10/15/2015 10/03/2015 08/07/2015  Does Patient Have a Medical Advance Directive? Yes Yes Yes Yes No Yes No  Type of Paramedic of [place];Living will [place];Living will - [place];Living will - Living will;Healthcare Power of Attorney -  Does patient want to make changes to medical advance directive? - No - Patient declined - No - Patient declined - - -  Copy of [place] in Chart? Yes - validated most recent copy scanned in chart (See row information) No - copy requested - No - copy requested - No - copy requested -  Would patient like information on creating a medical advance directive? - - - - No - patient declined information - No - patient declined information  Pre-existing out of facility DNR order (yellow form or pink MOST form) - - - - - - -    Tobacco Social History   Tobacco Use  Smoking Status Former Smoker  . Quit date: 10/02/1954  . Years since quitting: 65.5  Smokeless Tobacco Never Used     Counseling given: Not  Answered   Clinical Intake:  Pre-visit preparation completed: Yes  Pain : 0-10 Pain Score: 8  Pain Type: Chronic pain Pain Location: Back Pain Orientation: Left, Lower Pain Descriptors / Indicators: Aching Pain Onset: More than a month ago Pain Frequency: Intermittent     Nutritional Status: BMI > 30  Obese Nutritional Risks: None Diabetes: No  How often do you need to have someone help you when you read instructions, pamphlets, or other written materials from your doctor or pharmacy?: 1 - Never What is the last grade level you completed in school?: business school  Interpreter Needed?: No  Information entered by :: CJohnson, LPN  Past Medical History:  Diagnosis Date  . Adenoma   . Chronic leg pain   . Colon polyp   . DDD (degenerative disc disease), cervical   . Diverticulosis of colon    internal----Dr. Vira Agar  . Duodenitis   . Esophagitis   . GERD (gastroesophageal reflux disease)   . Hemorrhoids   . HLD (hyperlipidemia)   . IBS (irritable bowel syndrome)    with PP diarrhea  . Neuropathy   . Neuropathy   . OA (osteoarthritis) of knee   . Osteopenia   . PONV (postoperative nausea and vomiting)   . Scoliosis   . Seborrheic dermatitis   . Shortness of breath dyspnea    with activity  . TMJ syndrome   . Trochanteric bursitis   . Unsteady gait  FALLS EASILY   Past Surgical History:  Procedure Laterality Date  . CARPAL TUNNEL RELEASE Left 01/18/2013   Procedure: CARPAL TUNNEL RELEASE;  Surgeon: Wynonia Sours, MD;  Location: [facility];  Service: Orthopedics;  Laterality: Left;  OSTEOTOMY LEFT DISTAL RADIUS BONE CHIPS CARPAL TUNNEL RELEASE LEFT   . CATARACT EXTRACTION W/PHACO Right 08/07/2015   Procedure: CATARACT EXTRACTION PHACO AND INTRAOCULAR LENS PLACEMENT (IOC);  Surgeon: Birder Robson, MD;  Location: [facility];  Service: Ophthalmology;  Laterality: Right;  [place] 00:48.0 AP   22.3 CDE 10.69 casette lot # [id number]  . CATARACT  EXTRACTION W/PHACO Left 09/04/2015   Procedure: CATARACT EXTRACTION PHACO AND INTRAOCULAR LENS PLACEMENT (IOC);  Surgeon: Birder Robson, MD;  Location: [facility];  Service: Ophthalmology;  Laterality: Left;  [place] AP CDE FLUID LOT # [id number]  . CEREBRAL ANEURYSM REPAIR  2001   clamps  . COLONOSCOPY  1/11   polyps-hyperplastic and adenomatous  . FOOT SURGERY  2010   bilateral hammer toes and "knot"  . FRACTURE SURGERY Right 01/2014   result of a fall  . FRACTURE SURGERY Left 03/2014   result of a fall  . HEMORRHOID SURGERY    . KNEE ARTHROPLASTY Left 10/15/2015   Procedure: COMPUTER ASSISTED TOTAL KNEE ARTHROPLASTY;  Surgeon: Dereck Leep, MD;  Location: [facility];  Service: Orthopedics;  Laterality: Left;  . OPEN REDUCTION INTERNAL FIXATION (ORIF) DISTAL RADIAL FRACTURE Right 02/13/2015   Procedure: OPEN REDUCTION INTERNAL FIXATION (ORIF) RIGHT DISTAL RADIUS;  Surgeon: Daryll Brod, MD;  Location: [facility];  Service: Orthopedics;  Laterality: Right;  . OPEN REDUCTION INTERNAL FIXATION (ORIF) FINGER WITH RADIAL BONE GRAFT Left 11/01/2013   Procedure: OPEN REDUCTION INTERNAL FIXATION (ORIF) LEFT SMALL FINGER;  Surgeon: Wynonia Sours, MD;  Location: [place];  Service: Orthopedics;  Laterality: Left;  . REPLACEMENT TOTAL KNEE Left   . TOTAL ABDOMINAL HYSTERECTOMY    . WRIST OSTEOTOMY Left 01/18/2013   Procedure: WRIST OSTEOTOMY;  Surgeon: Wynonia Sours, MD;  Location: [place];  Service: Orthopedics;  Laterality: Left;   Family History  Problem Relation Age of Onset  . Colon cancer Mother   . Osteoporosis Other        hip fracture  . Stroke Brother   . Colon cancer Brother   . Hypertension Brother   . Other Brother        Heart problem  . Colon cancer Brother   . Colon cancer Maternal Aunt   . Breast cancer Neg Hx    Social History   Socioeconomic History  . Marital status: Married    Spouse name: Not on file  . Number of children:  Not on file  . Years of education: Not on file  . Highest education level: Not on file  Occupational History  . Occupation: Retired    Comment: bookkeeper  Tobacco Use  . Smoking status: Former Smoker    Quit date: 10/02/1954    Years since quitting: 65.5  . Smokeless tobacco: Never Used  Substance and Sexual Activity  . Alcohol use: No    Alcohol/week: 0.0 standard drinks  . Drug use: No  . Sexual activity: Never    Comment: smoked alittle as teen  Other Topics Concern  . Not on file  Social History Narrative   Retired      Married      Regular exercise         Social Determinants of  Health   Financial Resource Strain: Low Risk   . Difficulty of Paying Living Expenses: Not hard at all  Food Insecurity: No Food Insecurity  . Worried About Charity fundraiser in the Last Year: Never true  . Ran Out of Food in the Last Year: Never true  Transportation Needs: No Transportation Needs  . Lack of Transportation (Medical): No  . Lack of Transportation (Non-Medical): No  Physical Activity: Inactive  . Days of Exercise per Week: 0 days  . Minutes of Exercise per Session: 0 min  Stress: No Stress Concern Present  . Feeling of Stress : Not at all  Social Connections:   . Frequency of Communication with Friends and Family:   . Frequency of Social Gatherings with Friends and Family:   . Attends Religious Services:   . Active Member of Clubs or Organizations:   . Attends Archivist Meetings:   Marland Kitchen Marital Status:     Outpatient Encounter Medications as of 04/11/2020  Medication Sig  . acetaminophen (TYLENOL) 500 MG tablet Take 500 mg by mouth every 6 (six) hours as needed.  . Alum Hydroxide-Mag Carbonate (GAVISCON EXTRA STRENGTH) 160-105 MG CHEW Chew 1 tablet by mouth as needed (acid reflux).  . carbidopa-levodopa (SINEMET IR) 25-100 MG tablet TAKE 1 TABLET BY MOUTH 3 (THREE) TIMES DAILY.  . cephALEXin (KEFLEX) 500 MG capsule Take 1 capsule (500 mg total) by mouth 2  (two) times daily.  . cyanocobalamin 1000 MCG tablet Take 1,000 mcg by mouth daily. In am  . gabapentin (NEURONTIN) 300 MG capsule TAKE 1 CAPSULE BY MOUTH AT BEDTIME  . loperamide (IMODIUM) 2 MG capsule Take by mouth as needed for diarrhea or loose stools.  . predniSONE (DELTASONE) 10 MG tablet Take 3 pills once daily by mouth for 3 days, then 2 pills once daily for 3 days, then 1 pill once daily for 3 days and then stop   Facility-Administered Encounter Medications as of 04/11/2020  Medication  . dexamethasone (DECADRON) injection 10 mg    Activities of Daily Living In your present state of health, do you have any difficulty performing the following activities: 04/11/2020  Hearing? N  Vision? N  Difficulty concentrating or making decisions? N  Walking or climbing stairs? N  Dressing or bathing? Y  Comment aide helps  Doing errands, shopping? Y  Comment aide helps  Preparing Food and eating ? Y  Comment aide helps  Using the Toilet? N  In the past six months, have you accidently leaked urine? N  Do you have problems with loss of bowel control? N  Managing your Medications? N  Managing your Finances? N  Housekeeping or managing your Housekeeping? Y  Comment aide helps  Some recent data might be hidden    Patient Care Team: Tower, [name], MD as PCP - General Hooten, Laurice Record, MD as Consulting Physician (Orthopedic Surgery)    Assessment:   This is a routine wellness examination for [place].  Exercise Activities and Dietary recommendations Current Exercise Habits: The patient does not participate in regular exercise at present, Exercise limited by: None identified  Goals    . Increase water intake     Starting 07/25/2016, I will attempt to drink at least 6-8 glasses of water daily.     . Patient Stated     04/11/2020, I will maintain and continue medications as prescribed.        Fall Risk Fall Risk  04/11/2020 08/24/2017 05/27/2017 09/16/2016 08/01/2016  Falls in the  past year? 0 No No - Yes  Number falls in past yr: 0 - - 2 or more 2 or more  Injury with Fall? 0 - - Yes Yes  Risk Factor Category  - - - High Fall Risk High Fall Risk  Risk for fall due to : Medication side effect;Impaired balance/gait - - - -  Follow up Falls evaluation completed;Falls prevention discussed - - Falls evaluation completed Falls evaluation completed   Is the patient's home free of loose throw rugs in walkways, pet beds, electrical cords, etc?   yes      Grab bars in the bathroom? yes      Handrails on the stairs?   yes      Adequate lighting?   yes  Timed Get Up and Go performed: N/A  Depression Screen PHQ 2/9 Scores 04/11/2020 02/08/2020 08/24/2017 07/25/2016  PHQ - 2 Score 6 2 0 1  PHQ- 9 Score 6 6 - -     Cognitive Function MMSE - Mini Mental State Exam 04/11/2020 07/25/2016  Orientation to time 5 5  Orientation to Place 5 5  Registration 3 3  Attention/ Calculation 5 0  Recall 3 3  Language- name 2 objects - 0  Language- repeat 1 1  Language- follow 3 step command - 3  Language- read & follow direction - 0  Write a sentence - 0  Copy design - 0  Total score - 20  Mini Cog  Mini-Cog screen was completed. Maximum score is 22. A value of 0 denotes this part of the MMSE was not completed or the patient failed this part of the Mini-Cog screening.       Immunization History  Administered Date(s) Administered  . Influenza, High Dose Seasonal PF 11/17/2019  . Influenza,inj,Quad PF,6+ Mos 08/08/2016, 08/24/2017, 08/31/2018  . Influenza-Unspecified 09/08/2014, 09/15/2015  . Pneumococcal Conjugate-13 09/22/2014  . Pneumococcal Polysaccharide-23 07/13/2013  . Td 05/01/2005, 03/26/2015  . Zoster 08/31/2013    Qualifies for Shingles Vaccine: Yes   Screening Tests Health Maintenance  Topic Date Due  . COVID-19 Vaccine (1) Never done  . MAMMOGRAM  09/02/2018  . INFLUENZA VACCINE  07/01/2020  . TETANUS/TDAP  03/25/2025  . DEXA SCAN  Completed  . PNA vac Low  Risk Adult  Completed    Cancer Screenings: Lung: Low Dose CT Chest recommended if Age 15-80 years, 30 pack-year currently smoking OR have quit w/in 15 years. Patient does not qualify. Breast:  Up to date on Mammogram: No, due  Bone Density/Dexa: completed 07/18/2013 Colorectal: Cologuard  completed 09/24/2016  Additional Screenings:  Hepatitis C Screening: none     Plan:   Patient will maintain and continue medications as prescribed.    I have personally reviewed and noted the following in the patient's chart:   . Medical and social history . Use of alcohol, tobacco or illicit drugs  . Current medications and supplements . Functional ability and status . Nutritional status . Physical activity . Advanced directives . List of other physicians . Hospitalizations, surgeries, and ER visits in previous 12 months . Vitals . Screenings to include cognitive, depression, and falls . Referrals and appointments  In addition, I have reviewed and discussed with patient certain preventive protocols, quality metrics, and best practice recommendations. A written personalized care plan for preventive services as well as general preventive health recommendations were provided to patient.     Andrez Grime, LPN  [id number]

## 2020-04-11 NOTE — Patient Instructions (Signed)
Julie [name] , Thank you for taking time to come for your Medicare Wellness Visit. I appreciate your ongoing commitment to your health goals. Please review the following plan we discussed and let me know if I can assist you in the future.   Screening recommendations/referrals: Colonoscopy:Cologuard completed 09/24/2016 Mammogram: due Bone Density: completed 07/18/2013 Recommended yearly ophthalmology/optometry visit for glaucoma screening and checkup Recommended yearly dental visit for hygiene and checkup  Vaccinations: Influenza vaccine: Up to date, completed 11/17/2019 Pneumococcal vaccine: Completed series Tdap vaccine: Up to date, completed 03/26/2015 Shingles vaccine: discussed    Advanced directives: copy in chart  Conditions/risks identified: none  Next appointment: none   Preventive Care 47 Years and Older, Female Preventive care refers to lifestyle choices and visits with your health care provider that can promote health and wellness. What does preventive care include?  A yearly physical exam. This is also called an annual well check.  Dental exams once or twice a year.  Routine eye exams. Ask your health care provider how often you should have your eyes checked.  Personal lifestyle choices, including:  Daily care of your teeth and gums.  Regular physical activity.  Eating a healthy diet.  Avoiding tobacco and drug use.  Limiting alcohol use.  Practicing safe sex.  Taking low-dose aspirin every day.  Taking vitamin and mineral supplements as recommended by your health care provider. What happens during an annual well check? The services and screenings done by your health care provider during your annual well check will depend on your age, overall health, lifestyle risk factors, and family history of disease. Counseling  Your health care provider may ask you questions about your:  Alcohol use.  Tobacco use.  Drug use.  Emotional well-being.  Home and  relationship well-being.  Sexual activity.  Eating habits.  History of falls.  Memory and ability to understand (cognition).  Work and work Statistician.  Reproductive health. Screening  You may have the following tests or measurements:  Height, weight, and BMI.  Blood pressure.  Lipid and cholesterol levels. These may be checked every 5 years, or more frequently if you are over 52 years old.  Skin check.  Lung cancer screening. You may have this screening every year starting at age 73 if you have a 30-pack-year history of smoking and currently smoke or have quit within the past 15 years.  Fecal occult blood test (FOBT) of the stool. You may have this test every year starting at age 69.  Flexible sigmoidoscopy or colonoscopy. You may have a sigmoidoscopy every 5 years or a colonoscopy every 10 years starting at age 53.  Hepatitis C blood test.  Hepatitis B blood test.  Sexually transmitted disease (STD) testing.  Diabetes screening. This is done by checking your blood sugar (glucose) after you have not eaten for a while (fasting). You may have this done every 1-3 years.  Bone density scan. This is done to screen for osteoporosis. You may have this done starting at age 88.  Mammogram. This may be done every 1-2 years. Talk to your health care provider about how often you should have regular mammograms. Talk with your health care provider about your test results, treatment options, and if necessary, the need for more tests. Vaccines  Your health care provider may recommend certain vaccines, such as:  Influenza vaccine. This is recommended every year.  Tetanus, diphtheria, and acellular pertussis (Tdap, Td) vaccine. You may need a Td booster every 10 years.  Zoster vaccine. You  may need this after age 40.  Pneumococcal 13-valent conjugate (PCV13) vaccine. One dose is recommended after age 21.  Pneumococcal polysaccharide (PPSV23) vaccine. One dose is recommended after  age 48. Talk to your health care provider about which screenings and vaccines you need and how often you need them. This information is not intended to replace advice given to you by your health care provider. Make sure you discuss any questions you have with your health care provider. Document Released: 12/14/2015 Document Revised: 08/06/2016 Document Reviewed: 09/18/2015 Elsevier Interactive Patient Education  2017 [place] Prevention in the Home Falls can cause injuries. They can happen to people of all ages. There are many things you can do to make your home safe and to help prevent falls. What can I do on the outside of my home?  Regularly fix the edges of walkways and driveways and fix any cracks.  Remove anything that might make you trip as you walk through a door, such as a raised step or threshold.  Trim any bushes or trees on the path to your home.  Use bright outdoor lighting.  Clear any walking paths of anything that might make someone trip, such as rocks or tools.  Regularly check to see if handrails are loose or broken. Make sure that both sides of any steps have handrails.  Any raised decks and porches should have guardrails on the edges.  Have any leaves, snow, or ice cleared regularly.  Use sand or salt on walking paths during winter.  Clean up any spills in your garage right away. This includes oil or grease spills. What can I do in the bathroom?  Use night lights.  Install grab bars by the toilet and in the tub and shower. Do not use towel bars as grab bars.  Use non-skid mats or decals in the tub or shower.  If you need to sit down in the shower, use a plastic, non-slip stool.  Keep the floor dry. Clean up any water that spills on the floor as soon as it happens.  Remove soap buildup in the tub or shower regularly.  Attach bath mats securely with double-sided non-slip rug tape.  Do not have throw rugs and other things on the floor that can  make you trip. What can I do in the bedroom?  Use night lights.  Make sure that you have a light by your bed that is easy to reach.  Do not use any sheets or blankets that are too big for your bed. They should not hang down onto the floor.  Have a firm chair that has side arms. You can use this for support while you get dressed.  Do not have throw rugs and other things on the floor that can make you trip. What can I do in the kitchen?  Clean up any spills right away.  Avoid walking on wet floors.  Keep items that you use a lot in easy-to-reach places.  If you need to reach something above you, use a strong step stool that has a grab bar.  Keep electrical cords out of the way.  Do not use floor polish or wax that makes floors slippery. If you must use wax, use non-skid floor wax.  Do not have throw rugs and other things on the floor that can make you trip. What can I do with my stairs?  Do not leave any items on the stairs.  Make sure that there are handrails on both  sides of the stairs and use them. Fix handrails that are broken or loose. Make sure that handrails are as long as the stairways.  Check any carpeting to make sure that it is firmly attached to the stairs. Fix any carpet that is loose or worn.  Avoid having throw rugs at the top or bottom of the stairs. If you do have throw rugs, attach them to the floor with carpet tape.  Make sure that you have a light switch at the top of the stairs and the bottom of the stairs. If you do not have them, ask someone to add them for you. What else can I do to help prevent falls?  Wear shoes that:  Do not have high heels.  Have rubber bottoms.  Are comfortable and fit you well.  Are closed at the toe. Do not wear sandals.  If you use a stepladder:  Make sure that it is fully opened. Do not climb a closed stepladder.  Make sure that both sides of the stepladder are locked into place.  Ask someone to hold it for you,  if possible.  Clearly mark and make sure that you can see:  Any grab bars or handrails.  First and last steps.  Where the edge of each step is.  Use tools that help you move around (mobility aids) if they are needed. These include:  Canes.  Walkers.  Scooters.  Crutches.  Turn on the lights when you go into a dark area. Replace any light bulbs as soon as they burn out.  Set up your furniture so you have a clear path. Avoid moving your furniture around.  If any of your floors are uneven, fix them.  If there are any pets around you, be aware of where they are.  Review your medicines with your doctor. Some medicines can make you feel dizzy. This can increase your chance of falling. Ask your doctor what other things that you can do to help prevent falls. This information is not intended to replace advice given to you by your health care provider. Make sure you discuss any questions you have with your health care provider. Document Released: 09/13/2009 Document Revised: 04/24/2016 Document Reviewed: 12/22/2014 Elsevier Interactive Patient Education  2017 [employer].

## 2020-05-04 ENCOUNTER — Telehealth: Payer: Self-pay

## 2020-05-04 NOTE — Telephone Encounter (Signed)
Pt lmovm requesting call back from [place], no other information given  Called pt, pt's husband answered and stated pt had left and would be back later, not sure when she would be back and he did not know what she was calling in reference to

## 2020-05-04 NOTE — Telephone Encounter (Signed)
Called and spouse told me she was still out, he will have her call back when she gets home

## 2020-05-08 NOTE — Telephone Encounter (Signed)
[place] Night - Client Nonclinical Telephone Record  [facility] Client [place] Night - Client Client Site [place] Physician Tower, Roque Lias - MD Contact Type Call Who Is Calling Patient / Member / Family / Caregiver Caller Name [place] Phone Number [phone] Call Type Message Only Information Provided Reason for Call Returning a Call from the Office Initial [place] states is returning call from the office. Additional Comment Thank You Call back request, Disp. Time Disposition Final User 05/07/2020 5:32:14 PM General Information Provided Yes Margaretmary Bayley Call Closed By: Margaretmary Bayley Transaction Date/Time: 05/07/2020 5:30:59 PM (ET)

## 2020-05-08 NOTE — Telephone Encounter (Signed)
Called pt back. Pt is applying for assistance with the [facility] and there is a form she needs Dr. Glori Bickers to fill out, she wanted to know if she needs to make an appt or can she just drop off the form. I told pt to drop off the form so Dr. Glori Bickers can review it and if it's something that's detailed she will let us know if an OV is needed to complete form. Pt will drop it off so Dr. Glori Bickers can review it.  FYI to PCP

## 2020-05-08 NOTE — Telephone Encounter (Signed)
I will be on the lookout for it 

## 2020-05-10 ENCOUNTER — Telehealth: Payer: Self-pay | Admitting: Family Medicine

## 2020-05-10 NOTE — Telephone Encounter (Signed)
Patient dropped off [place] forms to be completed. Forms placed in Rx tower.

## 2020-05-10 NOTE — Telephone Encounter (Signed)
Upon review this is quite detailed with very specific info needs re: restriction of movement in each limb, etc I will need to talk to her to answer there  A virtual or phone visit is ok  I will hold it on my desk-please schedule

## 2020-05-10 NOTE — Telephone Encounter (Signed)
Called pt and she doesn't have access to a Smart phone/ computer so "phone call appt" scheduled for Monday 05/14/20

## 2020-05-10 NOTE — Telephone Encounter (Signed)
That sounds great.

## 2020-05-10 NOTE — Telephone Encounter (Signed)
Form in your inbox 

## 2020-05-14 ENCOUNTER — Encounter: Payer: Self-pay | Admitting: Family Medicine

## 2020-05-14 ENCOUNTER — Telehealth (INDEPENDENT_AMBULATORY_CARE_PROVIDER_SITE_OTHER): Payer: Medicare Other | Admitting: Family Medicine

## 2020-05-14 DIAGNOSIS — M48061 Spinal stenosis, lumbar region without neurogenic claudication: Secondary | ICD-10-CM | POA: Diagnosis not present

## 2020-05-14 DIAGNOSIS — Z7409 Other reduced mobility: Secondary | ICD-10-CM

## 2020-05-14 DIAGNOSIS — R2689 Other abnormalities of gait and mobility: Secondary | ICD-10-CM | POA: Diagnosis not present

## 2020-05-14 DIAGNOSIS — R29898 Other symptoms and signs involving the musculoskeletal system: Secondary | ICD-10-CM | POA: Diagnosis not present

## 2020-05-14 DIAGNOSIS — G588 Other specified mononeuropathies: Secondary | ICD-10-CM | POA: Diagnosis not present

## 2020-05-14 DIAGNOSIS — G20 Parkinson's disease: Secondary | ICD-10-CM

## 2020-05-14 NOTE — Assessment & Plan Note (Signed)
No longer takes sinemet  Needs assist with ADLs and uses walker  Poor balance and strength

## 2020-05-14 NOTE — Assessment & Plan Note (Signed)
Very limited spine flexion due to pain  Leans back in seat Leans to L with walker  Impairs mobility  Cannot stand to make meals

## 2020-05-14 NOTE — Assessment & Plan Note (Signed)
With risk for falls Past h/o movement disorder/ chorea and bad balance and falls  Uses walker  Needs assist to prep meals and bathe and leave house

## 2020-05-14 NOTE — Assessment & Plan Note (Addendum)
From chronic spine and joint pain, chorea, poor balance and L sided weakness Walks short distances at home with walker  Needs help leaving house  Cannot stand to make meals  Needs help with bathing  Forms filled out for veteran's affairs to get home help

## 2020-05-14 NOTE — Progress Notes (Signed)
Virtual Visit via Telephone Note  I connected with Julie [name] on 05/14/20 at  2:00 PM EDT by telephone and verified that I am speaking with the correct person using two identifiers.  Location: Patient: home Provider: office    I discussed the limitations, risks, security and privacy concerns of performing an evaluation and management service by telephone and the availability of in person appointments. I also discussed with the patient that there may be a patient responsible charge related to this service. The patient expressed understanding and agreed to proceed.  Parties involved in encounter  Patient: Julie [name]   Provider:  [name] MD    History of Present Illness: Pt presents for visit to fill out forms from [place]  She is the spouse of a veteran  Trying to get help with home care and later perhaps facility   Primary diagnosis - leg weakness and pain and neuropathy  Mobility impaired  Also back pain/spinal stenosis   She is mobility impaired from chronic joint and spine pain (spinal stenosis)  Also chorea of L leg with weakness/ possible parkinson's  She uses walker at all times  Normal nutrition and swallowing   She can feed herself but needs help preparing meals Also needs help bathing (cannot get into shower without help)  Vision -not legally blind   Lives at home/ may need facility care in the future if conditions progress  She currently managed her own medications  She also manages her affairs/ cognitively ok   Posture- generally leans back in seat (in walker leans to the left) No restrictions with grip and fine movements of hands   She is weak in left leg/ legs jerk (especially L one that has chorea) Very limited spine flexion   She has occasional loss of bowel control  Bladder control is on and off  She needs assist of at least one person and a walker to leave house  Tends to get out 3-4 times per week    Wt 178 lb bp  128/60 Pulse 60 Normal resp rate  Not confined to bed     Patient Active Problem List   Diagnosis Date Noted  . Mobility impaired 05/14/2020  . Spinal stenosis of lumbar region 02/08/2020  . Caregiver stress 02/08/2020  . Frequent urination 02/08/2020  . Change of skin color 08/31/2018  . Encounter for screening mammogram for breast cancer 08/24/2017  . Parkinsonism ([place]) 08/24/2017  . Routine general medical examination at a health care facility 08/08/2016  . Left knee pain 02/01/2016  . Total knee replacement status 10/15/2015  . B12 deficiency 07/16/2015  . Peripheral neuropathy 07/16/2015  . 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 Observation after surgery 02/13/2015  . Phalanx fracture, foot 01/10/2015  . Colon cancer screening 09/22/2014  . Osteoarthritis of left lower extremity 09/09/2014  . History of falling 04/26/2014  . Poor balance 04/26/2014  . Left-sided weakness 04/26/2014  . Encounter for Medicare annual wellness exam 07/13/2013  . Risk for falls 07/13/2013  . Skin tag of labia 03/14/2013  . Back pain, thoracic 08/07/2011  . Weakness of left leg 07/30/2011  . BACK PAIN, LUMBAR 12/10/2010  . Vitamin D deficiency 08/16/2010  . GERD 03/26/2010  . DYSPEPSIA 03/26/2010  . IRRITABLE BOWEL SYNDROME 03/26/2010  . Osteoporosis 08/27/2009  . Hypertriglyceridemia 07/24/2009  . DIVERTICULOSIS, COLON 07/24/2009  . OSTEOARTHRITIS 07/24/2009  . COLONIC POLYPS, HX OF 07/24/2009  . POSTMENOPAUSAL STATUS 07/24/2009   Past Medical History:  Diagnosis Date  .  Adenoma   . Chronic leg pain   . Colon polyp   . DDD (degenerative disc disease), cervical   . Diverticulosis of colon    internal----Dr. Vira Agar  . Duodenitis   . Esophagitis   . GERD (gastroesophageal reflux disease)   . Hemorrhoids   . HLD (hyperlipidemia)   . IBS (irritable bowel syndrome)    with PP diarrhea  . Neuropathy   . Neuropathy   . OA (osteoarthritis) of knee   . Osteopenia   . PONV (postoperative nausea  and vomiting)   . Scoliosis   . Seborrheic dermatitis   . Shortness of breath dyspnea    with activity  . TMJ syndrome   . Trochanteric bursitis   . Unsteady gait    FALLS EASILY   Past Surgical History:  Procedure Laterality Date  . CARPAL TUNNEL RELEASE Left 01/18/2013   Procedure: CARPAL TUNNEL RELEASE;  Surgeon: Wynonia Sours, MD;  Location: [facility];  Service: Orthopedics;  Laterality: Left;  OSTEOTOMY LEFT DISTAL RADIUS BONE CHIPS CARPAL TUNNEL RELEASE LEFT   . CATARACT EXTRACTION W/PHACO Right 08/07/2015   Procedure: CATARACT EXTRACTION PHACO AND INTRAOCULAR LENS PLACEMENT (IOC);  Surgeon: Birder Robson, MD;  Location: [facility];  Service: Ophthalmology;  Laterality: Right;  [place] 00:48.0 AP   22.3 CDE 10.69 casette lot # [id number]  . CATARACT EXTRACTION W/PHACO Left 09/04/2015   Procedure: CATARACT EXTRACTION PHACO AND INTRAOCULAR LENS PLACEMENT (IOC);  Surgeon: Birder Robson, MD;  Location: [facility];  Service: Ophthalmology;  Laterality: Left;  [place] AP CDE FLUID LOT # [id number]  . CEREBRAL ANEURYSM REPAIR  2001   clamps  . COLONOSCOPY  1/11   polyps-hyperplastic and adenomatous  . FOOT SURGERY  2010   bilateral hammer toes and "knot"  . FRACTURE SURGERY Right 01/2014   result of a fall  . FRACTURE SURGERY Left 03/2014   result of a fall  . HEMORRHOID SURGERY    . KNEE ARTHROPLASTY Left 10/15/2015   Procedure: COMPUTER ASSISTED TOTAL KNEE ARTHROPLASTY;  Surgeon: Dereck Leep, MD;  Location: [facility];  Service: Orthopedics;  Laterality: Left;  . OPEN REDUCTION INTERNAL FIXATION (ORIF) DISTAL RADIAL FRACTURE Right 02/13/2015   Procedure: OPEN REDUCTION INTERNAL FIXATION (ORIF) RIGHT DISTAL RADIUS;  Surgeon: Daryll Brod, MD;  Location: [place];  Service: Orthopedics;  Laterality: Right;  . OPEN REDUCTION INTERNAL FIXATION (ORIF) FINGER WITH RADIAL BONE GRAFT Left 11/01/2013   Procedure: OPEN REDUCTION INTERNAL FIXATION (ORIF) LEFT SMALL FINGER;   Surgeon: Wynonia Sours, MD;  Location: [place];  Service: Orthopedics;  Laterality: Left;  . REPLACEMENT TOTAL KNEE Left   . TOTAL ABDOMINAL HYSTERECTOMY    . WRIST OSTEOTOMY Left 01/18/2013   Procedure: WRIST OSTEOTOMY;  Surgeon: Wynonia Sours, MD;  Location: [place];  Service: Orthopedics;  Laterality: Left;   Social History   Tobacco Use  . Smoking status: Former Smoker    Quit date: 10/02/1954    Years since quitting: 65.6  . Smokeless tobacco: Never Used  Vaping Use  . Vaping Use: Never used  Substance Use Topics  . Alcohol use: No    Alcohol/week: 0.0 standard drinks  . Drug use: No   Family History  Problem Relation Age of Onset  . Colon cancer Mother   . Osteoporosis Other        hip fracture  . Stroke Brother   . Colon cancer Brother   . Hypertension Brother   .  Other Brother        Heart problem  . Colon cancer Brother   . Colon cancer Maternal Aunt   . Breast cancer Neg Hx    Allergies  Allergen Reactions  . Codeine     REACTION: headache and nausea and vomiting   Current Outpatient Medications on File Prior to Visit  Medication Sig Dispense Refill  . acetaminophen (TYLENOL) 500 MG tablet Take 500 mg by mouth every 6 (six) hours as needed.    . Alum Hydroxide-Mag Carbonate (GAVISCON EXTRA STRENGTH) 160-105 MG CHEW Chew 1 tablet by mouth as needed (acid reflux).    . gabapentin (NEURONTIN) 300 MG capsule TAKE 1 CAPSULE BY MOUTH AT BEDTIME 90 capsule 3  . loperamide (IMODIUM) 2 MG capsule Take by mouth as needed for diarrhea or loose stools.     No current facility-administered medications on file prior to visit.   Review of Systems  Constitutional: Negative for chills, fever and malaise/fatigue.  HENT: Negative for congestion, ear pain, sinus pain and sore throat.   Eyes: Negative for blurred vision, discharge and redness.  Respiratory: Negative for cough, shortness of breath and stridor.   Cardiovascular: Negative for  chest pain, palpitations and leg swelling.  Gastrointestinal: Negative for abdominal pain, diarrhea, nausea and vomiting.  Musculoskeletal: Positive for back pain, falls and joint pain. Negative for myalgias.  Skin: Negative for rash.  Neurological: Negative for dizziness and headaches.       Chorea of legs-worse on L (jerking)    Observations/Objective: Pt sounded like her usual self  No hoarseness or cough or sob  Responded to questions normally  Baseline cognition/good historian  Mood is normal    Assessment and Plan: Problem List Items Addressed This Visit      Nervous and Auditory   Weakness of left leg    Chronic-with some chorea  Sees neurology Impairs mobility and causes need for help with ADLs  Forms filled out for help from Veteran's affairs      Peripheral neuropathy    Takes gabapentin Impairs gait stable      Parkinsonism ([place])    No longer takes sinemet  Needs assist with ADLs and uses walker  Poor balance and strength        Other   Poor balance    With risk for falls Past h/o movement disorder/ chorea and bad balance and falls  Uses walker  Needs assist to prep meals and bathe and leave house      Spinal stenosis of lumbar region    Very limited spine flexion due to pain  Leans back in seat Leans to L with walker  Impairs mobility  Cannot stand to make meals      Mobility impaired - Primary    From chronic spine and joint pain, chorea, poor balance and L sided weakness Walks short distances at home with walker  Needs help leaving house  Cannot stand to make meals  Needs help with bathing  Forms filled out for veteran's affairs to get home help           Follow Up Instructions: I will work on forms from veteran's affairs to get you help in the home if you qualify    I discussed the assessment and treatment plan with the patient. The patient was provided an opportunity to ask questions and all were answered. The patient agreed with  the plan and demonstrated an understanding of the instructions.   The patient was  advised to call back or seek an in-person evaluation if the symptoms worsen or if the condition fails to improve as anticipated.  I provided 18 minutes of non-face-to-face time during this encounter.   [name], MD

## 2020-05-14 NOTE — Assessment & Plan Note (Signed)
Takes gabapentin Impairs gait stable

## 2020-05-14 NOTE — Patient Instructions (Signed)
I will work on forms from veteran's affairs to get you help in the home if you qualify

## 2020-05-14 NOTE — Assessment & Plan Note (Signed)
Chronic-with some chorea  Sees neurology Impairs mobility and causes need for help with ADLs  Forms filled out for help from [place] affairs

## 2020-07-23 DIAGNOSIS — M2042 Other hammer toe(s) (acquired), left foot: Secondary | ICD-10-CM | POA: Diagnosis not present

## 2020-07-23 DIAGNOSIS — M79675 Pain in left toe(s): Secondary | ICD-10-CM | POA: Diagnosis not present

## 2020-07-23 DIAGNOSIS — B351 Tinea unguium: Secondary | ICD-10-CM | POA: Diagnosis not present

## 2020-07-23 DIAGNOSIS — L600 Ingrowing nail: Secondary | ICD-10-CM | POA: Diagnosis not present

## 2020-07-23 DIAGNOSIS — M79674 Pain in right toe(s): Secondary | ICD-10-CM | POA: Diagnosis not present

## 2020-08-29 ENCOUNTER — Telehealth: Payer: Self-pay | Admitting: *Deleted

## 2020-08-29 NOTE — Telephone Encounter (Signed)
Pt returned your call Best number [phone]

## 2020-08-29 NOTE — Telephone Encounter (Signed)
Pt just wanted Dr. Otho Ket phone # (ortho) info given

## 2020-08-29 NOTE — Telephone Encounter (Signed)
Patient left a voicemail requesting that Dr. Marliss Coots assistant call her back. Tried to call patient back and got her voicemail. Left message for patient to call the office back.

## 2020-09-05 ENCOUNTER — Ambulatory Visit: Payer: Medicare Other | Admitting: Orthopaedic Surgery

## 2020-09-07 ENCOUNTER — Other Ambulatory Visit: Payer: Self-pay | Admitting: Nurse Practitioner

## 2020-09-07 ENCOUNTER — Other Ambulatory Visit (HOSPITAL_COMMUNITY): Payer: Self-pay | Admitting: Nurse Practitioner

## 2020-09-07 DIAGNOSIS — M545 Low back pain, unspecified: Secondary | ICD-10-CM

## 2020-09-07 DIAGNOSIS — G8929 Other chronic pain: Secondary | ICD-10-CM

## 2020-09-07 DIAGNOSIS — M47815 Spondylosis without myelopathy or radiculopathy, thoracolumbar region: Secondary | ICD-10-CM | POA: Diagnosis not present

## 2020-09-07 DIAGNOSIS — M160 Bilateral primary osteoarthritis of hip: Secondary | ICD-10-CM | POA: Diagnosis not present

## 2020-09-07 DIAGNOSIS — M25551 Pain in right hip: Secondary | ICD-10-CM | POA: Diagnosis not present

## 2020-09-20 ENCOUNTER — Ambulatory Visit: Payer: Medicare Other | Admitting: Specialist

## 2020-09-29 ENCOUNTER — Other Ambulatory Visit: Payer: Self-pay

## 2020-09-29 ENCOUNTER — Ambulatory Visit
Admission: RE | Admit: 2020-09-29 | Discharge: 2020-09-29 | Disposition: A | Discharge status: Home / Self Care | Payer: Medicare Other | Source: Ambulatory Visit | Attending: Nurse Practitioner | Admitting: Nurse Practitioner

## 2020-09-29 DIAGNOSIS — M545 Low back pain, unspecified: Secondary | ICD-10-CM | POA: Insufficient documentation

## 2020-09-29 DIAGNOSIS — G8929 Other chronic pain: Secondary | ICD-10-CM | POA: Insufficient documentation

## 2020-10-09 ENCOUNTER — Emergency Department
Admission: EM | Admit: 2020-10-09 | Discharge: 2020-10-09 | Disposition: A | Discharge status: Home / Self Care | Payer: Medicare Other | Attending: Emergency Medicine | Admitting: Emergency Medicine

## 2020-10-09 ENCOUNTER — Emergency Department: Payer: Medicare Other

## 2020-10-09 DIAGNOSIS — M25522 Pain in left elbow: Secondary | ICD-10-CM | POA: Diagnosis not present

## 2020-10-09 DIAGNOSIS — Z743 Need for continuous supervision: Secondary | ICD-10-CM | POA: Diagnosis not present

## 2020-10-09 DIAGNOSIS — S0990XA Unspecified injury of head, initial encounter: Secondary | ICD-10-CM | POA: Diagnosis not present

## 2020-10-09 DIAGNOSIS — Z96652 Presence of left artificial knee joint: Secondary | ICD-10-CM | POA: Insufficient documentation

## 2020-10-09 DIAGNOSIS — S5002XA Contusion of left elbow, initial encounter: Secondary | ICD-10-CM | POA: Insufficient documentation

## 2020-10-09 DIAGNOSIS — M542 Cervicalgia: Secondary | ICD-10-CM | POA: Insufficient documentation

## 2020-10-09 DIAGNOSIS — S199XXA Unspecified injury of neck, initial encounter: Secondary | ICD-10-CM | POA: Diagnosis not present

## 2020-10-09 DIAGNOSIS — R519 Headache, unspecified: Secondary | ICD-10-CM | POA: Diagnosis present

## 2020-10-09 DIAGNOSIS — G20 Parkinson's disease: Secondary | ICD-10-CM | POA: Diagnosis not present

## 2020-10-09 DIAGNOSIS — G44319 Acute post-traumatic headache, not intractable: Secondary | ICD-10-CM

## 2020-10-09 DIAGNOSIS — Y939 Activity, unspecified: Secondary | ICD-10-CM | POA: Diagnosis not present

## 2020-10-09 DIAGNOSIS — M7989 Other specified soft tissue disorders: Secondary | ICD-10-CM | POA: Diagnosis not present

## 2020-10-09 DIAGNOSIS — W19XXXA Unspecified fall, initial encounter: Secondary | ICD-10-CM | POA: Diagnosis not present

## 2020-10-09 DIAGNOSIS — Y92126 Garden or yard of nursing home as the place of occurrence of the external cause: Secondary | ICD-10-CM | POA: Insufficient documentation

## 2020-10-09 DIAGNOSIS — M25519 Pain in unspecified shoulder: Secondary | ICD-10-CM | POA: Diagnosis not present

## 2020-10-09 DIAGNOSIS — R6889 Other general symptoms and signs: Secondary | ICD-10-CM | POA: Diagnosis not present

## 2020-10-09 MED ORDER — BACITRACIN-NEOMYCIN-POLYMYXIN 400-5-5000 EX OINT
TOPICAL_OINTMENT | Freq: Once | CUTANEOUS | Status: AC
  Administered 2020-10-09: 1 via TOPICAL
  Filled 2020-10-09: qty 1

## 2020-10-09 MED ORDER — ACETAMINOPHEN 325 MG PO TABS
650.0000 mg | ORAL_TABLET | Freq: Once | ORAL | Status: AC
  Administered 2020-10-09: 650 mg via ORAL
  Filled 2020-10-09: qty 2

## 2020-10-09 NOTE — ED Triage Notes (Signed)
Pt was being pushed by husband at [facility] in wheel chair and fell backwards and hit back of head , no loc, no thinners, also small left elbow abrasion , pt arrived with c-collar  On by [facility]

## 2020-10-09 NOTE — ED Notes (Signed)
Called [facility] to arrange transportation.

## 2020-10-10 NOTE — ED Provider Notes (Signed)
[facility] Emergency Department Provider Note  ____________________________________________   First MD Initiated Contact with Patient 10/09/20 1555     (approximate)  I have reviewed the triage vital signs and the nursing notes.   HISTORY  Chief Complaint Fall (from wheel chair, hit back of head, no thinners no loc )   HPI Julie [name] is a 86 y.o. female who reports to the emergency department for evaluation of a fall at her nursing home.  She states that she was having some foot pain and instead of using her walker she was sitting in a wheelchair with her husband pushing her.  They were on a sidewalk and hit an uneven portion of the sidewalk causing the wheelchair to flip backwards.  She states that she hit the back of her head on the sidewalk and is having quite a bit of headache.  She is also complaining of left elbow pain.  With any loss of consciousness, dizziness, blurred vision.  Denies any weakness in her extremities.        Past Medical History:  Diagnosis Date  . Adenoma   . Chronic leg pain   . Colon polyp   . DDD (degenerative disc disease), cervical   . Diverticulosis of colon    internal----Dr. Vira Agar  . Duodenitis   . Esophagitis   . GERD (gastroesophageal reflux disease)   . Hemorrhoids   . HLD (hyperlipidemia)   . IBS (irritable bowel syndrome)    with PP diarrhea  . Neuropathy   . Neuropathy   . OA (osteoarthritis) of knee   . Osteopenia   . PONV (postoperative nausea and vomiting)   . Scoliosis   . Seborrheic dermatitis   . Shortness of breath dyspnea    with activity  . TMJ syndrome   . Trochanteric bursitis   . Unsteady gait    FALLS EASILY    Patient Active Problem List   Diagnosis Date Noted  . Mobility impaired 05/14/2020  . Spinal stenosis of lumbar region 02/08/2020  . Caregiver stress 02/08/2020  . Frequent urination 02/08/2020  . Change of skin color 08/31/2018  . Encounter for screening mammogram  for breast cancer 08/24/2017  . Parkinsonism ([place]) 08/24/2017  . Routine general medical examination at a health care facility 08/08/2016  . Left knee pain 02/01/2016  . Total knee replacement status 10/15/2015  . B12 deficiency 07/16/2015  . Peripheral neuropathy 07/16/2015  . 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 Observation after surgery 02/13/2015  . Phalanx fracture, foot 01/10/2015  . Colon cancer screening 09/22/2014  . Osteoarthritis of left lower extremity 09/09/2014  . History of falling 04/26/2014  . Poor balance 04/26/2014  . Left-sided weakness 04/26/2014  . Encounter for Medicare annual wellness exam 07/13/2013  . Risk for falls 07/13/2013  . Skin tag of labia 03/14/2013  . Back pain, thoracic 08/07/2011  . Weakness of left leg 07/30/2011  . BACK PAIN, LUMBAR 12/10/2010  . Vitamin D deficiency 08/16/2010  . GERD 03/26/2010  . DYSPEPSIA 03/26/2010  . IRRITABLE BOWEL SYNDROME 03/26/2010  . Osteoporosis 08/27/2009  . Hypertriglyceridemia 07/24/2009  . DIVERTICULOSIS, COLON 07/24/2009  . OSTEOARTHRITIS 07/24/2009  . COLONIC POLYPS, HX OF 07/24/2009  . POSTMENOPAUSAL STATUS 07/24/2009    Past Surgical History:  Procedure Laterality Date  . CARPAL TUNNEL RELEASE Left 01/18/2013   Procedure: CARPAL TUNNEL RELEASE;  Surgeon: Wynonia Sours, MD;  Location: [place];  Service: Orthopedics;  Laterality: Left;  OSTEOTOMY LEFT DISTAL RADIUS  BONE CHIPS CARPAL TUNNEL RELEASE LEFT   . CATARACT EXTRACTION W/PHACO Right 08/07/2015   Procedure: CATARACT EXTRACTION PHACO AND INTRAOCULAR LENS PLACEMENT (IOC);  Surgeon: Birder Robson, MD;  Location: [facility];  Service: Ophthalmology;  Laterality: Right;  [place] 00:48.0 AP   22.3 CDE 10.69 casette lot # [id number]  . CATARACT EXTRACTION W/PHACO Left 09/04/2015   Procedure: CATARACT EXTRACTION PHACO AND INTRAOCULAR LENS PLACEMENT (IOC);  Surgeon: Birder Robson, MD;  Location: [facility];  Service: Ophthalmology;  Laterality: Left;   [place] AP CDE FLUID LOT # [id number]  . CEREBRAL ANEURYSM REPAIR  2001   clamps  . COLONOSCOPY  1/11   polyps-hyperplastic and adenomatous  . FOOT SURGERY  2010   bilateral hammer toes and "knot"  . FRACTURE SURGERY Right 01/2014   result of a fall  . FRACTURE SURGERY Left 03/2014   result of a fall  . HEMORRHOID SURGERY    . KNEE ARTHROPLASTY Left 10/15/2015   Procedure: COMPUTER ASSISTED TOTAL KNEE ARTHROPLASTY;  Surgeon: Dereck Leep, MD;  Location: [facility];  Service: Orthopedics;  Laterality: Left;  . OPEN REDUCTION INTERNAL FIXATION (ORIF) DISTAL RADIAL FRACTURE Right 02/13/2015   Procedure: OPEN REDUCTION INTERNAL FIXATION (ORIF) RIGHT DISTAL RADIUS;  Surgeon: Daryll Brod, MD;  Location: [place];  Service: Orthopedics;  Laterality: Right;  . OPEN REDUCTION INTERNAL FIXATION (ORIF) FINGER WITH RADIAL BONE GRAFT Left 11/01/2013   Procedure: OPEN REDUCTION INTERNAL FIXATION (ORIF) LEFT SMALL FINGER;  Surgeon: Wynonia Sours, MD;  Location: [place];  Service: Orthopedics;  Laterality: Left;  . REPLACEMENT TOTAL KNEE Left   . TOTAL ABDOMINAL HYSTERECTOMY    . WRIST OSTEOTOMY Left 01/18/2013   Procedure: WRIST OSTEOTOMY;  Surgeon: Wynonia Sours, MD;  Location: [place];  Service: Orthopedics;  Laterality: Left;    Prior to Admission medications   Medication Sig Start Date End Date Taking? Authorizing Provider  acetaminophen (TYLENOL) 500 MG tablet Take 500 mg by mouth every 6 (six) hours as needed.    [provider]  Alum Hydroxide-Mag Carbonate (GAVISCON EXTRA STRENGTH) 160-105 MG CHEW Chew 1 tablet by mouth as needed (acid reflux).    [provider]  gabapentin (NEURONTIN) 300 MG capsule TAKE 1 CAPSULE BY MOUTH AT BEDTIME 01/13/20   Jessy Oto, MD  loperamide (IMODIUM) 2 MG capsule Take by mouth as needed for diarrhea or loose stools.    [provider]    Allergies Codeine  Family History  Problem  Relation Age of Onset  . Colon cancer Mother   . Osteoporosis Other        hip fracture  . Stroke Brother   . Colon cancer Brother   . Hypertension Brother   . Other Brother        Heart problem  . Colon cancer Brother   . Colon cancer Maternal Aunt   . Breast cancer Neg Hx     Social History Social History   Tobacco Use  . Smoking status: Former Smoker    Quit date: 10/02/1954    Years since quitting: 66.0  . Smokeless tobacco: Never Used  Vaping Use  . Vaping Use: Never used  Substance Use Topics  . Alcohol use: No    Alcohol/week: 0.0 standard drinks  . Drug use: No    Review of Systems Constitutional: No fever/chills Eyes: No visual changes. ENT: No sore throat. Cardiovascular: Denies chest pain. Respiratory: Denies shortness of breath. Gastrointestinal: No abdominal  pain.  No nausea, no vomiting.  No diarrhea.  No constipation. Genitourinary: Negative for dysuria. Musculoskeletal: + Neck pain, + left elbow pain, negative for back pain. Skin: Negative for rash. Neurological: + Headache, negative for focal weakness or numbness.   ____________________________________________   PHYSICAL EXAM:  VITAL SIGNS: ED Triage Vitals  Enc Vitals Group     BP 10/09/20 1533 (!) 175/76     Pulse Rate 10/09/20 1533 73     Resp 10/09/20 1533 16     Temp 10/09/20 1533 98.6 F (37 C)     Temp Source 10/09/20 1533 Oral     SpO2 10/09/20 1533 98 %     Weight 10/09/20 1534 158 lb 11.7 oz (72 kg)     Height 10/09/20 1534 5\' 3"  (1.6 m)     Head Circumference --      Peak Flow --      Pain Score 10/09/20 1534 0     Pain Loc --      Pain Edu? --      Excl. in [place]? --    Constitutional: Alert and oriented. Well appearing and in no acute distress. Eyes: Conjunctivae are normal. PERRL. EOMI. Head: Atraumatic.  No scalp laceration or hematoma identified. Nose: No congestion/rhinnorhea. Mouth/Throat: Mucous membranes are moist.  Oropharynx non-erythematous. Neck: No stridor.   C-collar in place upon initial examination. Cardiovascular: Normal rate, regular rhythm. Grossly normal heart sounds.  Good peripheral circulation. Respiratory: Normal respiratory effort.  No retractions. Lungs CTAB. Gastrointestinal: Soft and nontender. No distention. No abdominal bruits. No CVA tenderness. Musculoskeletal: There is tenderness to the olecranon region of the left elbow with associated abrasion.  No active bleeding.  Patient has range of motion of the left elbow and appropriate distal pulses.  No deformities.  No lower extremity tenderness nor edema.  No joint effusions. Neurologic:  Normal speech and language. No gross focal neurologic deficits are appreciated. No gait instability. Skin:  Skin is warm, dry and intact. No rash noted. Psychiatric: Mood and affect are normal. Speech and behavior are normal.  ____________________________________________  RADIOLOGY I, Marlana Salvage, personally viewed and evaluated these images (plain radiographs) as part of my medical decision making, as well as reviewing the written report by the radiologist.  ED provider interpretation: Left elbow films reveal soft tissue swelling without any evidence of fracture.  Official radiology report available of this CT of the head and cervical spine reveal no acute pathology.  ____________________________________________   INITIAL IMPRESSION / ASSESSMENT AND PLAN / ED COURSE  As part of my medical decision making, I reviewed the following data within the [facility] notes reviewed and incorporated, Radiograph reviewed  and Notes from prior ED visits        Patient is a 86 year old female who presents emergency department following a fall backwards from a wheelchair.  See HPI for further details.  Physical exam is reassuring for no neurological deficits.  CT of the head and neck were obtained and revealed no intracranial pathology or acute cervical spine fracture.  The  c-collar was removed at that time.  Patient has mild left-sided tenderness of the cervical spine following removal of the collar.  Patient also has an abrasion to the left elbow with no deficits in range of motion and a normal x-ray of the left elbow.  Patient is stable at this time for outpatient follow-up and to return to her place of living with her husband.  Patient is amenable  with this plan and will use Tylenol for any headache.      ____________________________________________   FINAL CLINICAL IMPRESSION(S) / ED DIAGNOSES  Final diagnoses:  Acute post-traumatic headache, not intractable  Neck pain  Contusion of left elbow, initial encounter     ED Discharge Orders    None      *Please note:  KEMIA WENDEL was evaluated in Emergency Department on 10/10/2020 for the symptoms described in the history of present illness. She was evaluated in the context of the global COVID-19 pandemic, which necessitated consideration that the patient might be at risk for infection with the SARS-CoV-2 virus that causes COVID-19. Institutional protocols and algorithms that pertain to the evaluation of patients at risk for COVID-19 are in a state of rapid change based on information released by regulatory bodies including the CDC and federal and state organizations. These policies and algorithms were followed during the patient's care in the ED.  Some ED evaluations and interventions may be delayed as a result of limited staffing during and the pandemic.*   Note:  This document was prepared using Dragon voice recognition software and may include unintentional dictation errors.    Marlana Salvage, PA 10/10/20 2325    Blake Divine, MD 10/13/20 [phone]

## 2020-10-23 ENCOUNTER — Encounter: Payer: Self-pay | Admitting: Family Medicine

## 2020-10-23 ENCOUNTER — Ambulatory Visit (INDEPENDENT_AMBULATORY_CARE_PROVIDER_SITE_OTHER)
Admission: RE | Admit: 2020-10-23 | Discharge: 2020-10-23 | Disposition: A | Discharge status: Home / Self Care | Payer: Medicare Other | Source: Ambulatory Visit | Attending: Family Medicine | Admitting: Family Medicine

## 2020-10-23 ENCOUNTER — Ambulatory Visit (INDEPENDENT_AMBULATORY_CARE_PROVIDER_SITE_OTHER): Payer: Medicare Other | Admitting: Family Medicine

## 2020-10-23 ENCOUNTER — Other Ambulatory Visit: Payer: Self-pay

## 2020-10-23 VITALS — BP 126/58 | HR 80 | Temp 96.6°F | Ht 63.0 in | Wt 174.0 lb

## 2020-10-23 DIAGNOSIS — M546 Pain in thoracic spine: Secondary | ICD-10-CM

## 2020-10-23 DIAGNOSIS — M5442 Lumbago with sciatica, left side: Secondary | ICD-10-CM

## 2020-10-23 DIAGNOSIS — S060X9A Concussion with loss of consciousness of unspecified duration, initial encounter: Secondary | ICD-10-CM | POA: Insufficient documentation

## 2020-10-23 DIAGNOSIS — Z9181 History of falling: Secondary | ICD-10-CM | POA: Diagnosis not present

## 2020-10-23 DIAGNOSIS — G8929 Other chronic pain: Secondary | ICD-10-CM

## 2020-10-23 DIAGNOSIS — M47814 Spondylosis without myelopathy or radiculopathy, thoracic region: Secondary | ICD-10-CM | POA: Diagnosis not present

## 2020-10-23 DIAGNOSIS — M4185 Other forms of scoliosis, thoracolumbar region: Secondary | ICD-10-CM | POA: Diagnosis not present

## 2020-10-23 DIAGNOSIS — S060X0S Concussion without loss of consciousness, sequela: Secondary | ICD-10-CM

## 2020-10-23 DIAGNOSIS — S5002XS Contusion of left elbow, sequela: Secondary | ICD-10-CM

## 2020-10-23 DIAGNOSIS — S060XAA Concussion with loss of consciousness status unknown, initial encounter: Secondary | ICD-10-CM | POA: Insufficient documentation

## 2020-10-23 DIAGNOSIS — S5002XA Contusion of left elbow, initial encounter: Secondary | ICD-10-CM | POA: Insufficient documentation

## 2020-10-23 NOTE — Assessment & Plan Note (Signed)
S/p fall  Reviewed hospital records, lab results and studies in detail  Reassuring xray  Good rom today and abrasion is healed Recommend compression sleeve or wrap prn with ice  Update if no further improvement

## 2020-10-23 NOTE — Assessment & Plan Note (Signed)
S/p head injury after fall out of wheelchair on 11/9 Contusion with headache Reviewed hospital records, lab results and studies in detail   Reassuring CT Symptoms have not changed (headache w/o confusion or dizziness)  Disc imp of brain rest with pt for recovery (difficult as she cares for husband who has to be with her at all times)  Reassuring exam  Disc s/s to watch for (for subdural)  Ice to head prn  Handout given

## 2020-10-23 NOTE — Assessment & Plan Note (Signed)
Deg disc dz- going to [facility] clinic  MRI report reviewed

## 2020-10-23 NOTE — Assessment & Plan Note (Signed)
Worse since recent fall  Most pain is muscular on the L  Xray today to r/o compression fracture  Adv heat/stretches May consider PT if no imp

## 2020-10-23 NOTE — Progress Notes (Signed)
Subjective:    Patient ID: Julie [name], female    DOB: May 07, 1932, 86 y.o.   MRN: [id number]  This visit occurred during the SARS-CoV-2 public health emergency.  Safety protocols were in place, including screening questions prior to the visit, additional usage of staff PPE, and extensive cleaning of exam room while observing appropriate contact time as indicated for disinfecting solutions.    HPI Pt presents for f/u of a fall   Wt Readings from Last 3 Encounters:  10/23/20 174 lb (78.9 kg)  10/09/20 158 lb 11.7 oz (72 kg)  04/11/20 177 lb (80.3 kg)   30.82 kg/m  Went to ER on 11/9  [facility] home and it back of her head (uneven ground-husband was pushing her   CT of head was reassuring   Abrasion L elbow and nl xray  Neck soreness   Head still hurts -top and front  Has a headache all the time   Some thoracic pain as well  Cannot get comfortable at night - did not get xray of that area   Elbow still hurts  She has wrapped it with ace bandage   No dizziness or nausea  Headache is constant  Taking ibuprofen  No confusion or trouble concentrating   DG Elbow Complete Left  Result Date: 10/09/2020 CLINICAL DATA:  Fall, left elbow pain EXAM: LEFT ELBOW - COMPLETE 3+ VIEW COMPARISON:  None. FINDINGS: Four view radiograph left elbow demonstrates normal alignment. No fracture or dislocation. Minimal glenohumeral degenerative arthritis with osteophyte formation noted along the coronoid process. No effusion. Moderate soft tissue swelling noted superficial to the olecranon. IMPRESSION: Soft tissue swelling.  No fracture or dislocation. Electronically Signed   By: Fidela Salisbury MD   On: 10/09/2020 17:00   CT Head Wo Contrast  Result Date: 10/09/2020 CLINICAL DATA:  Neck trauma, fall, head injury EXAM: CT HEAD WITHOUT CONTRAST CT CERVICAL SPINE WITHOUT CONTRAST TECHNIQUE: Multidetector CT imaging of the head and cervical spine was performed following  the standard protocol without intravenous contrast. Multiplanar CT image reconstructions of the cervical spine were also generated. COMPARISON:  None. CT head 03/15/2015 FINDINGS: CT HEAD FINDINGS Brain: Right temporal craniotomy has been performed with multiple vascular aneurysm clip seen in the region of the terminal right ICA/M1 segment, unchanged from prior examination. Small focus of encephalomalacia within the inferior right basal ganglia and anterior pole of the a right temporal lobe is again noted. Moderate periventricular white matter changes are again noted, likely reflecting the sequela of small vessel ischemia. No evidence of acute intracranial hemorrhage or infarct. No abnormal mass effect or midline shift. No abnormal intra or extra-axial mass lesion. Ventricular size is normal. Cerebellum is unremarkable. Vascular: No asymmetric hyperdense vasculature noted at the skull base though imaging is slightly limited by streak artifact from adjacent aneurysm clips Skull: No acute calvarial fracture identified. Sinuses/Orbits: The paranasal sinuses are clear. Orbits are unremarkable. Other: Mastoid air cells and middle ear cavities are clear. CT CERVICAL SPINE FINDINGS Alignment: Normal.  No listhesis. Skull base and vertebrae: The craniocervical junction is unremarkable. The atlantodental interval is normal. No acute fracture of the cervical spine. No lytic or blastic bone lesion. Soft tissues and spinal canal: No prevertebral fluid or swelling. No visible canal hematoma. Disc levels: Review of the sagittal reformats demonstrates preservation of vertebral body height. There is intervertebral disc space narrowing and endplate remodeling of H7-W2 in keeping with changes of moderate to severe degenerative disc disease.  Small posterior disc osteophytes are noted at these levels. The spinal canal is widely patent. The prevertebral soft tissues are not thickened. Review of the axial images demonstrates multilevel  uncovertebral and facet arthrosis resulting in mild bilateral neural foraminal narrowing at C4-5, C5-6 and C6-7. No significant canal stenosis. Upper chest: Unremarkable Other: None significant IMPRESSION: No acute intracranial injury.  No calvarial fracture. No acute fracture or listhesis of the cervical spine. Electronically Signed   By: Fidela Salisbury MD   On: 10/09/2020 17:22   CT Cervical Spine Wo Contrast  Result Date: 10/09/2020 CLINICAL DATA:  Neck trauma, fall, head injury EXAM: CT HEAD WITHOUT CONTRAST CT CERVICAL SPINE WITHOUT CONTRAST TECHNIQUE: Multidetector CT imaging of the head and cervical spine was performed following the standard protocol without intravenous contrast. Multiplanar CT image reconstructions of the cervical spine were also generated. COMPARISON:  None. CT head 03/15/2015 FINDINGS: CT HEAD FINDINGS Brain: Right temporal craniotomy has been performed with multiple vascular aneurysm clip seen in the region of the terminal right ICA/M1 segment, unchanged from prior examination. Small focus of encephalomalacia within the inferior right basal ganglia and anterior pole of the a right temporal lobe is again noted. Moderate periventricular white matter changes are again noted, likely reflecting the sequela of small vessel ischemia. No evidence of acute intracranial hemorrhage or infarct. No abnormal mass effect or midline shift. No abnormal intra or extra-axial mass lesion. Ventricular size is normal. Cerebellum is unremarkable. Vascular: No asymmetric hyperdense vasculature noted at the skull base though imaging is slightly limited by streak artifact from adjacent aneurysm clips Skull: No acute calvarial fracture identified. Sinuses/Orbits: The paranasal sinuses are clear. Orbits are unremarkable. Other: Mastoid air cells and middle ear cavities are clear. CT CERVICAL SPINE FINDINGS Alignment: Normal.  No listhesis. Skull base and vertebrae: The craniocervical junction is unremarkable.  The atlantodental interval is normal. No acute fracture of the cervical spine. No lytic or blastic bone lesion. Soft tissues and spinal canal: No prevertebral fluid or swelling. No visible canal hematoma. Disc levels: Review of the sagittal reformats demonstrates preservation of vertebral body height. There is intervertebral disc space narrowing and endplate remodeling of U2-G2 in keeping with changes of moderate to severe degenerative disc disease. Small posterior disc osteophytes are noted at these levels. The spinal canal is widely patent. The prevertebral soft tissues are not thickened. Review of the axial images demonstrates multilevel uncovertebral and facet arthrosis resulting in mild bilateral neural foraminal narrowing at C4-5, C5-6 and C6-7. No significant canal stenosis. Upper chest: Unremarkable Other: None significant IMPRESSION: No acute intracranial injury.  No calvarial fracture. No acute fracture or listhesis of the cervical spine. Electronically Signed   By: Fidela Salisbury MD   On: 10/09/2020 17:22   MR LUMBAR SPINE WO CONTRAST  Result Date: 09/30/2020 CLINICAL DATA:  Lower back pain and left hip pain. EXAM: MRI LUMBAR SPINE WITHOUT CONTRAST TECHNIQUE: Multiplanar, multisequence MR imaging of the lumbar spine was performed. No intravenous contrast was administered. COMPARISON:  09/07/2020 lumbar spine report. 03/17/2017 lumbar spine radiographs and prior. 08/03/2011 MRI lumbar spine. 08/06/2006 CT lumbar spine. FINDINGS: Segmentation:  Standard. Alignment: Lumbar levocurvature. Stepwise grade 1 L4-5 and L5-S1 anterolisthesis, unchanged. Stepwise grade 1 T11-12, T12-L1, L1-2 and L2-3 retrolisthesis, unchanged. Vertebrae: Multilevel Modic type 2 endplate degenerative changes and Schmorl's node formation. Scattered T1/T2 hyperintense foci reflect hemangiomata versus focal fat. Vertebral body heights are preserved. Conus medullaris and cauda equina: Conus extends to the L2 level. Conus and cauda  equina  appear normal. Disc levels: Multilevel desiccation and disc space loss. T12-L1: Minimal grade 1 retrolisthesis with disc uncovering demonstrating left predominance. Prominent ligamentum flavum and bilateral facet degenerative spurring. Patent spinal canal and right neural foramen. Mild left neural foraminal narrowing. L1-2: Minimal grade 1 retrolisthesis and disc uncovering. Shallow left foraminal protrusion. Ligamentum flavum and bilateral facet hypertrophy. Patent spinal canal. Mild bilateral neural foraminal narrowing. L2-3: Mild disc bulge, ligamentum flavum and bilateral facet hypertrophy. Patent spinal canal and neural foramen. L3-4: Mild disc bulge, ligamentum flavum and bilateral facet hypertrophy. Patent spinal canal and neural foramen. L4-5: Grade 1 anterolisthesis with disc bulge uncovering. Prominent ligamentum flavum and bilateral facet hypertrophy. Patent spinal canal and neural foramen. L5-S1: Grade 1 anterolisthesis with posterior bulge uncovering. There is grazing of the descending right S1 nerve root. Bilateral facet hypertrophy. Patent spinal canal and neural foramen. Paraspinal and other soft tissues: Right renal cysts. IMPRESSION: Multilevel spondylosis, slightly progressed since prior exam. No significant spinal canal narrowing. Mild left T12-L1 and bilateral L1-2 neural foraminal narrowing. Electronically Signed   By: Primitivo Gauze M.D.   On: 09/30/2020 16:45   pt has been seen at [facility] clinic for deg disc dz in LS - waiting for a tx plan   Patient Active Problem List   Diagnosis Date Noted  . Thoracic back pain 10/23/2020  . Left elbow contusion 10/23/2020  . Concussion 10/23/2020  . Mobility impaired 05/14/2020  . Spinal stenosis of lumbar region 02/08/2020  . Caregiver stress 02/08/2020  . Frequent urination 02/08/2020  . Change of skin color 08/31/2018  . Encounter for screening mammogram for breast cancer 08/24/2017  . Parkinsonism ([place]) 08/24/2017  .  Routine general medical examination at a health care facility 08/08/2016  . Left knee pain 02/01/2016  . Total knee replacement status 10/15/2015  . B12 deficiency 07/16/2015  . Peripheral neuropathy 07/16/2015  . 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 Observation after surgery 02/13/2015  . Phalanx fracture, foot 01/10/2015  . Colon cancer screening 09/22/2014  . Osteoarthritis of left lower extremity 09/09/2014  . History of falling 04/26/2014  . Poor balance 04/26/2014  . Left-sided weakness 04/26/2014  . Encounter for Medicare annual wellness exam 07/13/2013  . Risk for falls 07/13/2013  . Skin tag of labia 03/14/2013  . Weakness of left leg 07/30/2011  . BACK PAIN, LUMBAR 12/10/2010  . Vitamin D deficiency 08/16/2010  . GERD 03/26/2010  . DYSPEPSIA 03/26/2010  . IRRITABLE BOWEL SYNDROME 03/26/2010  . Osteoporosis 08/27/2009  . Hypertriglyceridemia 07/24/2009  . DIVERTICULOSIS, COLON 07/24/2009  . OSTEOARTHRITIS 07/24/2009  . COLONIC POLYPS, HX OF 07/24/2009  . POSTMENOPAUSAL STATUS 07/24/2009   Past Medical History:  Diagnosis Date  . Adenoma   . Chronic leg pain   . Colon polyp   . DDD (degenerative disc disease), cervical   . Diverticulosis of colon    internal----Dr. Vira Agar  . Duodenitis   . Esophagitis   . GERD (gastroesophageal reflux disease)   . Hemorrhoids   . HLD (hyperlipidemia)   . IBS (irritable bowel syndrome)    with PP diarrhea  . Neuropathy   . Neuropathy   . OA (osteoarthritis) of knee   . Osteopenia   . PONV (postoperative nausea and vomiting)   . Scoliosis   . Seborrheic dermatitis   . Shortness of breath dyspnea    with activity  . TMJ syndrome   . Trochanteric bursitis   . Unsteady gait    FALLS EASILY   Past Surgical History:  Procedure  Laterality Date  . CARPAL TUNNEL RELEASE Left 01/18/2013   Procedure: CARPAL TUNNEL RELEASE;  Surgeon: Wynonia Sours, MD;  Location: [place];  Service: Orthopedics;  Laterality: Left;  OSTEOTOMY  LEFT DISTAL RADIUS BONE CHIPS CARPAL TUNNEL RELEASE LEFT   . CATARACT EXTRACTION W/PHACO Right 08/07/2015   Procedure: CATARACT EXTRACTION PHACO AND INTRAOCULAR LENS PLACEMENT (IOC);  Surgeon: Birder Robson, MD;  Location: [facility];  Service: Ophthalmology;  Laterality: Right;  [place] 00:48.0 AP   22.3 CDE 10.69 casette lot # [id number]  . CATARACT EXTRACTION W/PHACO Left 09/04/2015   Procedure: CATARACT EXTRACTION PHACO AND INTRAOCULAR LENS PLACEMENT (IOC);  Surgeon: Birder Robson, MD;  Location: [facility];  Service: Ophthalmology;  Laterality: Left;  [place] AP CDE FLUID LOT # [id number]  . CEREBRAL ANEURYSM REPAIR  2001   clamps  . COLONOSCOPY  1/11   polyps-hyperplastic and adenomatous  . FOOT SURGERY  2010   bilateral hammer toes and "knot"  . FRACTURE SURGERY Right 01/2014   result of a fall  . FRACTURE SURGERY Left 03/2014   result of a fall  . HEMORRHOID SURGERY    . KNEE ARTHROPLASTY Left 10/15/2015   Procedure: COMPUTER ASSISTED TOTAL KNEE ARTHROPLASTY;  Surgeon: Dereck Leep, MD;  Location: [facility];  Service: Orthopedics;  Laterality: Left;  . OPEN REDUCTION INTERNAL FIXATION (ORIF) DISTAL RADIAL FRACTURE Right 02/13/2015   Procedure: OPEN REDUCTION INTERNAL FIXATION (ORIF) RIGHT DISTAL RADIUS;  Surgeon: Daryll Brod, MD;  Location: [place];  Service: Orthopedics;  Laterality: Right;  . OPEN REDUCTION INTERNAL FIXATION (ORIF) FINGER WITH RADIAL BONE GRAFT Left 11/01/2013   Procedure: OPEN REDUCTION INTERNAL FIXATION (ORIF) LEFT SMALL FINGER;  Surgeon: Wynonia Sours, MD;  Location: [place];  Service: Orthopedics;  Laterality: Left;  . REPLACEMENT TOTAL KNEE Left   . TOTAL ABDOMINAL HYSTERECTOMY    . WRIST OSTEOTOMY Left 01/18/2013   Procedure: WRIST OSTEOTOMY;  Surgeon: Wynonia Sours, MD;  Location: [place];  Service: Orthopedics;  Laterality: Left;   Social History   Tobacco Use  . Smoking status: Former Smoker    Quit date:  10/02/1954    Years since quitting: 66.1  . Smokeless tobacco: Never Used  Vaping Use  . Vaping Use: Never used  Substance Use Topics  . Alcohol use: No    Alcohol/week: 0.0 standard drinks  . Drug use: No   Family History  Problem Relation Age of Onset  . Colon cancer Mother   . Osteoporosis Other        hip fracture  . Stroke Brother   . Colon cancer Brother   . Hypertension Brother   . Other Brother        Heart problem  . Colon cancer Brother   . Colon cancer Maternal Aunt   . Breast cancer Neg Hx    Allergies  Allergen Reactions  . Codeine     REACTION: headache and nausea and vomiting   Current Outpatient Medications on File Prior to Visit  Medication Sig Dispense Refill  . Alum Hydroxide-Mag Carbonate (GAVISCON EXTRA STRENGTH) 160-105 MG CHEW Chew 1 tablet by mouth as needed (acid reflux).    Marland Kitchen amoxicillin (AMOXIL) 500 MG capsule Take 1,000 mg by mouth 2 (two) times daily.    Marland Kitchen gabapentin (NEURONTIN) 300 MG capsule TAKE 1 CAPSULE BY MOUTH AT BEDTIME 90 capsule 3  . IBUPROFEN PO Take by mouth.    . loperamide (IMODIUM) 2 MG  capsule Take by mouth as needed for diarrhea or loose stools.    Marland Kitchen acetaminophen (TYLENOL) 500 MG tablet Take 500 mg by mouth every 6 (six) hours as needed. (Patient not taking: Reported on 10/23/2020)     No current facility-administered medications on file prior to visit.     Review of Systems  Constitutional: Positive for fatigue. Negative for activity change, appetite change, fever and unexpected weight change.  HENT: Negative for congestion, ear pain, rhinorrhea, sinus pressure and sore throat.   Eyes: Negative for pain, redness and visual disturbance.  Respiratory: Negative for cough, shortness of breath and wheezing.   Cardiovascular: Negative for chest pain and palpitations.  Gastrointestinal: Negative for abdominal pain, blood in stool, constipation and diarrhea.  Endocrine: Negative for polydipsia and polyuria.  Genitourinary:  Negative for dysuria, frequency and urgency.  Musculoskeletal: Positive for arthralgias, back pain and gait problem. Negative for joint swelling and myalgias.  Skin: Negative for pallor and rash.  Allergic/Immunologic: Negative for environmental allergies.  Neurological: Negative for dizziness, syncope and headaches.  Hematological: Negative for adenopathy. Does not bruise/bleed easily.  Psychiatric/Behavioral: Negative for decreased concentration and dysphoric mood. The patient is not nervous/anxious.        Objective:   Physical Exam Constitutional:      General: She is not in acute distress.    Appearance: Normal appearance. She is normal weight. She is not ill-appearing.  HENT:     Mouth/Throat:     Mouth: Mucous membranes are moist.  Eyes:     General: No visual field deficit or scleral icterus.    Conjunctiva/sclera: Conjunctivae normal.     Pupils: Pupils are equal, round, and reactive to light.  Cardiovascular:     Rate and Rhythm: Normal rate and regular rhythm.     Heart sounds: Normal heart sounds.  Pulmonary:     Effort: Pulmonary effort is normal. No respiratory distress.     Breath sounds: Normal breath sounds. No wheezing.  Musculoskeletal:     Left elbow: No swelling or deformity. Normal range of motion. Tenderness present.     Cervical back: Normal range of motion. No tenderness.     Thoracic back: Spasms present. No swelling, edema or deformity. No scoliosis.     Right lower leg: No edema.     Left lower leg: No edema.     Comments: Mild medial epicondyle tenderness  Healed scab on olecranon process   Left sided thoracic muscular tenderness and spasm Also L trapezius  Limited rom of LS   Lymphadenopathy:     Cervical: No cervical adenopathy.  Neurological:     Mental Status: She is alert. Mental status is at baseline.     Cranial Nerves: No cranial nerve deficit, dysarthria or facial asymmetry.     Sensory: Sensation is intact. No sensory deficit.      Motor: No tremor or pronator drift.     Coordination: Coordination abnormal. Finger-Nose-Finger Test normal.     Gait: Gait abnormal.     Deep Tendon Reflexes: Reflexes normal.     Comments: Baseline bilateral leg weakness  No new focal neuro deficits  Generally poor balance    Psychiatric:        Mood and Affect: Mood normal.           Assessment & Plan:   Problem List Items Addressed This Visit      Other   BACK PAIN, LUMBAR    Deg disc dz- going to [facility]  MRI report reviewed       Relevant Medications   IBUPROFEN PO   History of falling    This fall was out of wheelchair on uneven ground Per pt- retirement home plans to fix that part of the sidewalk  Enc her to use walker at all times  Fall prev discussed       Thoracic back pain    Worse since recent fall  Most pain is muscular on the L  Xray today to r/o compression fracture  Adv heat/stretches May consider PT if no imp      Relevant Medications   IBUPROFEN PO   Other Relevant Orders   DG Thoracic Spine W/Swimmers   Left elbow contusion    S/p fall  Reviewed hospital records, lab results and studies in detail  Reassuring xray  Good rom today and abrasion is healed Recommend compression sleeve or wrap prn with ice  Update if no further improvement       Concussion - Primary    S/p head injury after fall out of wheelchair on 11/9 Contusion with headache Reviewed hospital records, lab results and studies in detail   Reassuring CT Symptoms have not changed (headache w/o confusion or dizziness)  Disc imp of brain rest with pt for recovery (difficult as she cares for husband who has to be with her at all times)  Reassuring exam  Disc s/s to watch for (for subdural)  Ice to head prn  Handout given

## 2020-10-23 NOTE — Patient Instructions (Addendum)
I want to xray your thoracic (middle spine) to make sure there are no fractures  I suspect you have pain from muscle spasm more likely   Wrap elbow or use an elastic brace from the drug store -to protect and compress it   Rest your brain (take breaks from activity and reading and screen time)  If headache does not gradually improve please let me know  If you develop worse pain, dizziness, confusion or nausea please let me know   Take care of yourself

## 2020-10-23 NOTE — Assessment & Plan Note (Signed)
This fall was out of wheelchair on uneven ground Per pt- retirement home plans to fix that part of the sidewalk  Enc her to use walker at all times  Fall prev discussed

## 2020-10-24 ENCOUNTER — Encounter: Payer: Self-pay | Admitting: *Deleted

## 2020-10-24 ENCOUNTER — Ambulatory Visit: Payer: Medicare Other | Admitting: Family Medicine

## 2020-11-16 DIAGNOSIS — B351 Tinea unguium: Secondary | ICD-10-CM | POA: Diagnosis not present

## 2020-11-16 DIAGNOSIS — M79674 Pain in right toe(s): Secondary | ICD-10-CM | POA: Diagnosis not present

## 2020-11-16 DIAGNOSIS — M79675 Pain in left toe(s): Secondary | ICD-10-CM | POA: Diagnosis not present

## 2021-01-14 ENCOUNTER — Other Ambulatory Visit (INDEPENDENT_AMBULATORY_CARE_PROVIDER_SITE_OTHER): Payer: Self-pay | Admitting: Specialist

## 2021-01-22 ENCOUNTER — Ambulatory Visit (INDEPENDENT_AMBULATORY_CARE_PROVIDER_SITE_OTHER): Payer: Medicare Other | Admitting: Family Medicine

## 2021-01-22 ENCOUNTER — Other Ambulatory Visit: Payer: Self-pay

## 2021-01-22 ENCOUNTER — Encounter: Payer: Self-pay | Admitting: Family Medicine

## 2021-01-22 VITALS — BP 112/70 | HR 71 | Temp 97.7°F | Ht 60.25 in | Wt 179.5 lb

## 2021-01-22 DIAGNOSIS — Z636 Dependent relative needing care at home: Secondary | ICD-10-CM | POA: Diagnosis not present

## 2021-01-22 DIAGNOSIS — R8290 Unspecified abnormal findings in urine: Secondary | ICD-10-CM | POA: Diagnosis not present

## 2021-01-22 DIAGNOSIS — N3941 Urge incontinence: Secondary | ICD-10-CM | POA: Insufficient documentation

## 2021-01-22 DIAGNOSIS — R600 Localized edema: Secondary | ICD-10-CM | POA: Insufficient documentation

## 2021-01-22 DIAGNOSIS — R350 Frequency of micturition: Secondary | ICD-10-CM | POA: Diagnosis not present

## 2021-01-22 DIAGNOSIS — G20 Parkinson's disease: Secondary | ICD-10-CM

## 2021-01-22 LAB — POC URINALSYSI DIPSTICK (AUTOMATED)
Bilirubin, UA: 2
Blood, UA: NEGATIVE
Glucose, UA: NEGATIVE
Ketones, UA: 5
Nitrite, UA: NEGATIVE
Protein, UA: POSITIVE — AB
Spec Grav, UA: >=1.03 — AB (ref 1.010–1.025)
Urobilinogen, UA: 0.2 E.U./dL
pH, UA: 5.5 (ref 5.0–8.0)

## 2021-01-22 NOTE — Assessment & Plan Note (Signed)
More problematic lately- she sits in walker quite a bit  Enc leg elevation  supp socks to knee (and DM socks w/o sharp elastic if sock line is a problem)  Dec sodium in diet  Watch for sob or other sympotms

## 2021-01-22 NOTE — Assessment & Plan Note (Signed)
With overactive bladder/handouts given  Enc kegel exercises  Urine cx pending today  May be candidate for medication -disc poss side eff Also est cream  Will consider once cx returns

## 2021-01-22 NOTE — Patient Instructions (Addendum)
Elevate your feet when you can  Try diabetic socks - that may feel better on your feet and ankles (better for swollen feet and ankle)   You can also buy knee high socks with support to help swelling  Wear them during the day   Wear shoes that are large enough   Let's get a urinalysis today and see if you have an infection  If no infection we may want to try some medicine for overactive bladder  Also estrogen cream

## 2021-01-22 NOTE — Progress Notes (Signed)
Subjective:    Patient ID: Julie [name], female    DOB: [birth year], 87 y.o.   MRN: [id number]  This visit occurred during the SARS-CoV-2 public health emergency.  Safety protocols were in place, including screening questions prior to the visit, additional usage of staff PPE, and extensive cleaning of exam room while observing appropriate contact time as indicated for disinfecting solutions.    HPI Pt presents with several medical issues  Wt Readings from Last 3 Encounters:  01/22/21 179 lb 8 oz (81.4 kg)  10/23/20 174 lb (78.9 kg)  10/09/20 158 lb 11.7 oz (72 kg)   34.77 kg/m  Having a lot of urinary frequency  Worse at night  With incontinence - worsening  No pain to urinate  No change in urine odor or color   No change in fluid intake  Does not leak wit cough /sneeze   Has urgency  Aware of kegel exercises- does them sometimes   Has not had urology visit or procedures   A lot of stress taking care of her husband   Sitting a lot and this makes her more swollen   Results for orders placed or performed in visit on 01/22/21  POCT Urinalysis Dipstick (Automated)  Result Value Ref Range   Color, UA Amber    Clarity, UA Cloudy    Glucose, UA Negative Negative   Bilirubin, UA 2 mg/dL    Ketones, UA 5 mg/dL    Spec Grav, UA >=1.030 (A) 1.010 - 1.025   Blood, UA Negative    pH, UA 5.5 5.0 - 8.0   Protein, UA Positive (A) Negative   Urobilinogen, UA 0.2 0.2 or 1.0 E.U./dL   Nitrite, UA Negative    Leukocytes, UA Trace (A) Negative     Patient Active Problem List   Diagnosis Date Noted  . Urge incontinence 01/22/2021  . Pedal edema 01/22/2021  . Thoracic back pain 10/23/2020  . Left elbow contusion 10/23/2020  . Mobility impaired 05/14/2020  . Spinal stenosis of lumbar region 02/08/2020  . Caregiver stress 02/08/2020  . Urinary frequency 02/08/2020  . Change of skin color 08/31/2018  . Encounter for screening mammogram for breast cancer 08/24/2017  .  Parkinsonism ([place]) 08/24/2017  . Routine general medical examination at a health care facility 08/08/2016  . Left knee pain 02/01/2016  . Total knee replacement status 10/15/2015  . B12 deficiency 07/16/2015  . Peripheral neuropathy 07/16/2015  . 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 Observation after surgery 02/13/2015  . Phalanx fracture, foot 01/10/2015  . Colon cancer screening 09/22/2014  . Osteoarthritis of left lower extremity 09/09/2014  . History of falling 04/26/2014  . Poor balance 04/26/2014  . Left-sided weakness 04/26/2014  . Encounter for Medicare annual wellness exam 07/13/2013  . Risk for falls 07/13/2013  . Skin tag of labia 03/14/2013  . Weakness of left leg 07/30/2011  . BACK PAIN, LUMBAR 12/10/2010  . Vitamin D deficiency 08/16/2010  . GERD 03/26/2010  . DYSPEPSIA 03/26/2010  . IRRITABLE BOWEL SYNDROME 03/26/2010  . Osteoporosis 08/27/2009  . Hypertriglyceridemia 07/24/2009  . DIVERTICULOSIS, COLON 07/24/2009  . OSTEOARTHRITIS 07/24/2009  . COLONIC POLYPS, HX OF 07/24/2009  . POSTMENOPAUSAL STATUS 07/24/2009   Past Medical History:  Diagnosis Date  . Adenoma   . Chronic leg pain   . Colon polyp   . DDD (degenerative disc disease), cervical   . Diverticulosis of colon    internal----Dr. Vira Agar  . Duodenitis   . Esophagitis   . GERD (  gastroesophageal reflux disease)   . Hemorrhoids   . HLD (hyperlipidemia)   . IBS (irritable bowel syndrome)    with PP diarrhea  . Neuropathy   . Neuropathy   . OA (osteoarthritis) of knee   . Osteopenia   . PONV (postoperative nausea and vomiting)   . Scoliosis   . Seborrheic dermatitis   . Shortness of breath dyspnea    with activity  . TMJ syndrome   . Trochanteric bursitis   . Unsteady gait    FALLS EASILY   Past Surgical History:  Procedure Laterality Date  . CARPAL TUNNEL RELEASE Left 01/18/2013   Procedure: CARPAL TUNNEL RELEASE;  Surgeon: Wynonia Sours, MD;  Location: [facility];  Service:  Orthopedics;  Laterality: Left;  OSTEOTOMY LEFT DISTAL RADIUS BONE CHIPS CARPAL TUNNEL RELEASE LEFT   . CATARACT EXTRACTION W/PHACO Right 08/07/2015   Procedure: CATARACT EXTRACTION PHACO AND INTRAOCULAR LENS PLACEMENT (IOC);  Surgeon: Birder Robson, MD;  Location: [facility];  Service: Ophthalmology;  Laterality: Right;  [place] 00:48.0 AP   22.3 CDE 10.69 casette lot # [id number]  . CATARACT EXTRACTION W/PHACO Left 09/04/2015   Procedure: CATARACT EXTRACTION PHACO AND INTRAOCULAR LENS PLACEMENT (IOC);  Surgeon: Birder Robson, MD;  Location: [facility];  Service: Ophthalmology;  Laterality: Left;  [place] AP CDE FLUID LOT # [id number]  . CEREBRAL ANEURYSM REPAIR  2001   clamps  . COLONOSCOPY  1/11   polyps-hyperplastic and adenomatous  . FOOT SURGERY  2010   bilateral hammer toes and "knot"  . FRACTURE SURGERY Right 01/2014   result of a fall  . FRACTURE SURGERY Left 03/2014   result of a fall  . HEMORRHOID SURGERY    . KNEE ARTHROPLASTY Left 10/15/2015   Procedure: COMPUTER ASSISTED TOTAL KNEE ARTHROPLASTY;  Surgeon: Dereck Leep, MD;  Location: [facility];  Service: Orthopedics;  Laterality: Left;  . OPEN REDUCTION INTERNAL FIXATION (ORIF) DISTAL RADIAL FRACTURE Right 02/13/2015   Procedure: OPEN REDUCTION INTERNAL FIXATION (ORIF) RIGHT DISTAL RADIUS;  Surgeon: Daryll Brod, MD;  Location: [place] [facility];  Service: Orthopedics;  Laterality: Right;  . OPEN REDUCTION INTERNAL FIXATION (ORIF) FINGER WITH RADIAL BONE GRAFT Left 11/01/2013   Procedure: OPEN REDUCTION INTERNAL FIXATION (ORIF) LEFT SMALL FINGER;  Surgeon: Wynonia Sours, MD;  Location: [facility];  Service: Orthopedics;  Laterality: Left;  . REPLACEMENT TOTAL KNEE Left   . TOTAL ABDOMINAL HYSTERECTOMY    . WRIST OSTEOTOMY Left 01/18/2013   Procedure: WRIST OSTEOTOMY;  Surgeon: Wynonia Sours, MD;  Location: [facility];  Service: Orthopedics;  Laterality: Left;   Social History   Tobacco Use  . Smoking  status: Former Smoker    Quit date: 10/02/1954    Years since quitting: 66.3  . Smokeless tobacco: Never Used  Vaping Use  . Vaping Use: Never used  Substance Use Topics  . Alcohol use: No    Alcohol/week: 0.0 standard drinks  . Drug use: No   Family History  Problem Relation Age of Onset  . Colon cancer Mother   . Osteoporosis Other        hip fracture  . Stroke Brother   . Colon cancer Brother   . Hypertension Brother   . Other Brother        Heart problem  . Colon cancer Brother   . Colon cancer Maternal Aunt   . Breast cancer Neg Hx    Allergies  Allergen Reactions  .  Codeine     REACTION: headache and nausea and vomiting   Current Outpatient Medications on File Prior to Visit  Medication Sig Dispense Refill  . acetaminophen (TYLENOL) 500 MG tablet Take 500 mg by mouth every 6 (six) hours as needed.    . Alum Hydroxide-Mag Carbonate 160-105 MG CHEW Chew 1 tablet by mouth as needed (acid reflux).    Marland Kitchen amoxicillin (AMOXIL) 500 MG capsule Take 1,000 mg by mouth 2 (two) times daily.    Marland Kitchen gabapentin (NEURONTIN) 300 MG capsule TAKE 1 CAPSULE BY MOUTH AT BEDTIME 90 capsule 3  . IBUPROFEN PO Take by mouth.    . loperamide (IMODIUM) 2 MG capsule Take by mouth as needed for diarrhea or loose stools.     No current facility-administered medications on file prior to visit.    Review of Systems  Constitutional: Positive for fatigue. Negative for activity change, appetite change, fever and unexpected weight change.  HENT: Negative for congestion, ear pain, rhinorrhea, sinus pressure and sore throat.   Eyes: Negative for pain, redness and visual disturbance.  Respiratory: Negative for cough, shortness of breath and wheezing.   Cardiovascular: Positive for leg swelling. Negative for chest pain and palpitations.  Gastrointestinal: Negative for abdominal pain, blood in stool, constipation and diarrhea.  Endocrine: Negative for polydipsia and polyuria.  Genitourinary: Positive for  frequency and urgency. Negative for dysuria, hematuria and pelvic pain.  Musculoskeletal: Negative for arthralgias, back pain and myalgias.  Skin: Negative for pallor and rash.  Allergic/Immunologic: Negative for environmental allergies.  Neurological: Negative for dizziness, syncope, light-headedness and headaches.       Generalized weakness Chorea  Mobility impaired  Hematological: Negative for adenopathy. Does not bruise/bleed easily.  Psychiatric/Behavioral: Positive for dysphoric mood. Negative for decreased concentration. The patient is nervous/anxious.        Objective:   Physical Exam Constitutional:      General: She is not in acute distress.    Appearance: Normal appearance. She is well-developed and well-nourished. She is obese. She is not ill-appearing or diaphoretic.  HENT:     Head: Normocephalic and atraumatic.     Mouth/Throat:     Mouth: Oropharynx is clear and moist. Mucous membranes are moist.  Eyes:     General: No scleral icterus.    Extraocular Movements: EOM normal.     Conjunctiva/sclera: Conjunctivae normal.     Pupils: Pupils are equal, round, and reactive to light.  Neck:     Thyroid: No thyromegaly.     Vascular: No carotid bruit or JVD.  Cardiovascular:     Rate and Rhythm: Normal rate and regular rhythm.     Pulses: Normal pulses and intact distal pulses.     Heart sounds: Normal heart sounds. No gallop.   Pulmonary:     Effort: Pulmonary effort is normal. No respiratory distress.     Breath sounds: Normal breath sounds. No wheezing or rales.     Comments: No crackles Abdominal:     General: Bowel sounds are normal. There is no distension or abdominal bruit.     Palpations: Abdomen is soft. There is no mass.     Tenderness: There is no abdominal tenderness.  Musculoskeletal:     Cervical back: Normal range of motion and neck supple. No tenderness.     Right lower leg: Edema present.     Left lower leg: Edema present.     Comments: Trace  pedal edema and sock line  Lymphadenopathy:  Cervical: No cervical adenopathy.  Skin:    General: Skin is warm and dry.     Findings: No erythema or rash.  Neurological:     Mental Status: She is alert.     Cranial Nerves: No cranial nerve deficit.     Sensory: No sensory deficit.     Deep Tendon Reflexes: Reflexes are normal and symmetric.  Psychiatric:        Mood and Affect: Mood is anxious.        Cognition and Memory: Cognition and memory normal.           Assessment & Plan:   Problem List Items Addressed This Visit      Nervous and Auditory   Parkinsonism ([place])    No recent changes        Other   Caregiver stress    She has no help caring for husband with dementia  Disc safety issues- suspect soon she will have to investigate placement ([facility])  Disc her own safety and self care       Urinary frequency - Primary    With suspected overactive bladder/urge incontinence  Interested in non surg tx  ua notes concentration , pos for protein and leuk (trace) Urine cx pending  Once clear can disc treatment      Relevant Orders   Urine Culture   POCT Urinalysis Dipstick (Automated) (Completed)   Urge incontinence    With overactive bladder/handouts given  Enc kegel exercises  Urine cx pending today  May be candidate for medication -disc poss side eff Also est cream  Will consider once cx returns      Relevant Orders   Urine Culture   POCT Urinalysis Dipstick (Automated) (Completed)   Pedal edema    More problematic lately- she sits in walker quite a bit  Enc leg elevation  supp socks to knee (and DM socks w/o sharp elastic if sock line is a problem)  Dec sodium in diet  Watch for sob or other sympotms         Other Visit Diagnoses    Abnormal urinalysis       Relevant Orders   Urine Culture

## 2021-01-22 NOTE — Assessment & Plan Note (Signed)
She has no help caring for husband with dementia  Disc safety issues- suspect soon she will have to investigate placement ([facility])  Disc her own safety and self care

## 2021-01-22 NOTE — Assessment & Plan Note (Signed)
No recent changes  

## 2021-01-22 NOTE — Assessment & Plan Note (Signed)
With suspected overactive bladder/urge incontinence  Interested in non surg tx  ua notes concentration , pos for protein and leuk (trace) Urine cx pending  Once clear can disc treatment

## 2021-01-23 LAB — URINE CULTURE
MICRO NUMBER:: 11564270
Result:: NO GROWTH
SPECIMEN QUALITY:: ADEQUATE

## 2021-01-25 ENCOUNTER — Telehealth: Payer: Self-pay

## 2021-01-25 NOTE — Telephone Encounter (Signed)
Pt called and left a VM on triage stating she was waiting to hear back whether she needed to go to the ER. I called back to speak to her and her husband stated she had left. [name] told the pt earlier today that Dr Glori Bickers wanted her to go to the ER. Will send to [place] to follow-up.

## 2021-01-25 NOTE — Telephone Encounter (Signed)
Called pt again and spoke with spouse see results note where original message was left on

## 2021-01-29 ENCOUNTER — Encounter: Payer: Self-pay | Admitting: Emergency Medicine

## 2021-01-29 ENCOUNTER — Emergency Department: Payer: Medicare Other

## 2021-01-29 ENCOUNTER — Emergency Department
Admission: EM | Admit: 2021-01-29 | Discharge: 2021-01-29 | Disposition: A | Discharge status: Home / Self Care | Payer: Medicare Other | Attending: Emergency Medicine | Admitting: Emergency Medicine

## 2021-01-29 ENCOUNTER — Other Ambulatory Visit: Payer: Self-pay

## 2021-01-29 DIAGNOSIS — Z96652 Presence of left artificial knee joint: Secondary | ICD-10-CM | POA: Diagnosis not present

## 2021-01-29 DIAGNOSIS — Y92002 Bathroom of unspecified non-institutional (private) residence single-family (private) house as the place of occurrence of the external cause: Secondary | ICD-10-CM | POA: Insufficient documentation

## 2021-01-29 DIAGNOSIS — W182XXA Fall in (into) shower or empty bathtub, initial encounter: Secondary | ICD-10-CM | POA: Insufficient documentation

## 2021-01-29 DIAGNOSIS — Z87891 Personal history of nicotine dependence: Secondary | ICD-10-CM | POA: Diagnosis not present

## 2021-01-29 DIAGNOSIS — R519 Headache, unspecified: Secondary | ICD-10-CM | POA: Diagnosis not present

## 2021-01-29 DIAGNOSIS — G20 Parkinson's disease: Secondary | ICD-10-CM | POA: Insufficient documentation

## 2021-01-29 DIAGNOSIS — S0990XA Unspecified injury of head, initial encounter: Secondary | ICD-10-CM | POA: Diagnosis not present

## 2021-01-29 MED ORDER — ACETAMINOPHEN 325 MG PO TABS
650.0000 mg | ORAL_TABLET | Freq: Once | ORAL | Status: AC
  Administered 2021-01-29: 650 mg via ORAL
  Filled 2021-01-29: qty 2

## 2021-01-29 NOTE — Discharge Instructions (Signed)
His CT reveals no acute findings from your fall. Follow discharge care instruction take extra strength Tylenol as needed for headache.

## 2021-01-29 NOTE — ED Provider Notes (Signed)
[facility] Emergency Department Provider Note   ____________________________________________   Event Date/Time   First MD Initiated Contact with Patient 01/29/21 1147     (approximate)  I have reviewed the triage vital signs and the nursing notes.   HISTORY  Chief Complaint Fall    HPI Julie [name] is a 87 y.o. female patient complain of continued headache status post a slip and fall in the shower 6 days ago.  Patient denies LOC but did hit the back of her head.  Patient states "I just do not feel good".  Pain located in front of her head.  Patient denies blurry vision, weakness, nausea, or vertigo.  Patient does not take blood thinners.  Patient had a history of aneurysm in the brain years ago.         Past Medical History:  Diagnosis Date  . Adenoma   . Chronic leg pain   . Colon polyp   . DDD (degenerative disc disease), cervical   . Diverticulosis of colon    internal----Dr. Vira Agar  . Duodenitis   . Esophagitis   . GERD (gastroesophageal reflux disease)   . Hemorrhoids   . HLD (hyperlipidemia)   . IBS (irritable bowel syndrome)    with PP diarrhea  . Neuropathy   . Neuropathy   . OA (osteoarthritis) of knee   . Osteopenia   . PONV (postoperative nausea and vomiting)   . Scoliosis   . Seborrheic dermatitis   . Shortness of breath dyspnea    with activity  . TMJ syndrome   . Trochanteric bursitis   . Unsteady gait    FALLS EASILY    Patient Active Problem List   Diagnosis Date Noted  . Urge incontinence 01/22/2021  . Pedal edema 01/22/2021  . Thoracic back pain 10/23/2020  . Left elbow contusion 10/23/2020  . Mobility impaired 05/14/2020  . Spinal stenosis of lumbar region 02/08/2020  . Caregiver stress 02/08/2020  . Urinary frequency 02/08/2020  . Change of skin color 08/31/2018  . Encounter for screening mammogram for breast cancer 08/24/2017  . Parkinsonism ([place]) 08/24/2017  . Routine general medical  examination at a health care facility 08/08/2016  . Left knee pain 02/01/2016  . Total knee replacement status 10/15/2015  . B12 deficiency 07/16/2015  . Peripheral neuropathy 07/16/2015  . 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 Observation after surgery 02/13/2015  . Phalanx fracture, foot 01/10/2015  . Colon cancer screening 09/22/2014  . Osteoarthritis of left lower extremity 09/09/2014  . History of falling 04/26/2014  . Poor balance 04/26/2014  . Left-sided weakness 04/26/2014  . Encounter for Medicare annual wellness exam 07/13/2013  . Risk for falls 07/13/2013  . Skin tag of labia 03/14/2013  . Weakness of left leg 07/30/2011  . BACK PAIN, LUMBAR 12/10/2010  . Vitamin D deficiency 08/16/2010  . GERD 03/26/2010  . DYSPEPSIA 03/26/2010  . IRRITABLE BOWEL SYNDROME 03/26/2010  . Osteoporosis 08/27/2009  . Hypertriglyceridemia 07/24/2009  . DIVERTICULOSIS, COLON 07/24/2009  . OSTEOARTHRITIS 07/24/2009  . COLONIC POLYPS, HX OF 07/24/2009  . POSTMENOPAUSAL STATUS 07/24/2009    Past Surgical History:  Procedure Laterality Date  . CARPAL TUNNEL RELEASE Left 01/18/2013   Procedure: CARPAL TUNNEL RELEASE;  Surgeon: Wynonia Sours, MD;  Location: [place];  Service: Orthopedics;  Laterality: Left;  OSTEOTOMY LEFT DISTAL RADIUS BONE CHIPS CARPAL TUNNEL RELEASE LEFT   . CATARACT EXTRACTION W/PHACO Right 08/07/2015   Procedure: CATARACT EXTRACTION PHACO AND INTRAOCULAR LENS PLACEMENT (  [place]);  Surgeon: Birder Robson, MD;  Location: [facility];  Service: Ophthalmology;  Laterality: Right;  [place] 00:48.0 AP   22.3 CDE 10.69 casette lot # [id number]  . CATARACT EXTRACTION W/PHACO Left 09/04/2015   Procedure: CATARACT EXTRACTION PHACO AND INTRAOCULAR LENS PLACEMENT ([facility]);  Surgeon: Birder Robson, MD;  Location: [facility];  Service: Ophthalmology;  Laterality: Left;  [place] AP CDE FLUID LOT # [id number]  . CEREBRAL ANEURYSM REPAIR  2001   clamps  . COLONOSCOPY  1/11   polyps-hyperplastic and  adenomatous  . FOOT SURGERY  2010   bilateral hammer toes and "knot"  . FRACTURE SURGERY Right 01/2014   result of a fall  . FRACTURE SURGERY Left 03/2014   result of a fall  . HEMORRHOID SURGERY    . KNEE ARTHROPLASTY Left 10/15/2015   Procedure: COMPUTER ASSISTED TOTAL KNEE ARTHROPLASTY;  Surgeon: Dereck Leep, MD;  Location: [facility];  Service: Orthopedics;  Laterality: Left;  . OPEN REDUCTION INTERNAL FIXATION (ORIF) DISTAL RADIAL FRACTURE Right 02/13/2015   Procedure: OPEN REDUCTION INTERNAL FIXATION (ORIF) RIGHT DISTAL RADIUS;  Surgeon: Daryll Brod, MD;  Location: [place];  Service: Orthopedics;  Laterality: Right;  . OPEN REDUCTION INTERNAL FIXATION (ORIF) FINGER WITH RADIAL BONE GRAFT Left 11/01/2013   Procedure: OPEN REDUCTION INTERNAL FIXATION (ORIF) LEFT SMALL FINGER;  Surgeon: Wynonia Sours, MD;  Location: [place];  Service: Orthopedics;  Laterality: Left;  . REPLACEMENT TOTAL KNEE Left   . TOTAL ABDOMINAL HYSTERECTOMY    . WRIST OSTEOTOMY Left 01/18/2013   Procedure: WRIST OSTEOTOMY;  Surgeon: Wynonia Sours, MD;  Location: [place];  Service: Orthopedics;  Laterality: Left;    Prior to Admission medications   Medication Sig Start Date End Date Taking? Authorizing Provider  acetaminophen (TYLENOL) 500 MG tablet Take 500 mg by mouth every 6 (six) hours as needed.    [provider]  Alum Hydroxide-Mag Carbonate 160-105 MG CHEW Chew 1 tablet by mouth as needed (acid reflux).    [provider]  amoxicillin (AMOXIL) 500 MG capsule Take 1,000 mg by mouth 2 (two) times daily. 10/08/20   [provider]  gabapentin (NEURONTIN) 300 MG capsule TAKE 1 CAPSULE BY MOUTH AT BEDTIME 01/14/21   Jessy Oto, MD  IBUPROFEN PO Take by mouth.    [provider]  loperamide (IMODIUM) 2 MG capsule Take by mouth as needed for diarrhea or loose stools.    [provider]    Allergies Codeine  Family  History  Problem Relation Age of Onset  . Colon cancer Mother   . Osteoporosis Other        hip fracture  . Stroke Brother   . Colon cancer Brother   . Hypertension Brother   . Other Brother        Heart problem  . Colon cancer Brother   . Colon cancer Maternal Aunt   . Breast cancer Neg Hx     Social History Social History   Tobacco Use  . Smoking status: Former Smoker    Quit date: 10/02/1954    Years since quitting: 66.3  . Smokeless tobacco: Never Used  Vaping Use  . Vaping Use: Never used  Substance Use Topics  . Alcohol use: No    Alcohol/week: 0.0 standard drinks  . Drug use: No    Review of Systems Constitutional: No fever/chills Eyes: No visual changes. ENT: No sore throat. Cardiovascular: Denies chest pain. Respiratory: Denies  shortness of breath. Gastrointestinal: No abdominal pain.  No nausea, no vomiting.  No diarrhea.  No constipation. Genitourinary: Negative for dysuria. Musculoskeletal: Negative for back pain. Skin: Negative for rash. Neurological: Negative for headaches, focal weakness or numbness. Endocrine:  Hyperlipidemia Allergic/Immunilogical: Codeine ____________________________________________   PHYSICAL EXAM:  VITAL SIGNS: ED Triage Vitals  Enc Vitals Group     BP 01/29/21 1118 (!) 151/102     Pulse Rate 01/29/21 1118 63     Resp 01/29/21 1118 18     Temp 01/29/21 1118 97.7 F (36.5 C)     Temp Source 01/29/21 1118 Oral     SpO2 01/29/21 1118 95 %     Weight 01/29/21 1119 179 lb (81.2 kg)     Height 01/29/21 1119 5\' 3"  (1.6 m)     Head Circumference --      Peak Flow --      Pain Score 01/29/21 1127 5     Pain Loc --      Pain Edu? --      Excl. in [place]? --   Constitutional: Alert and oriented. Well appearing and in no acute distress. Eyes: Conjunctivae are normal. PERRL. EOMI. Head: Atraumatic. Nose: No congestion/rhinnorhea. Mouth/Throat: Mucous membranes are moist.  Oropharynx non-erythematous. Neck: No stridor.  No  cervical spine tenderness to palpation. Hematological/Lymphatic/Immunilogical: No cervical lymphadenopathy. Cardiovascular: Normal rate, regular rhythm. Grossly normal heart sounds.  Good peripheral circulation.  Elevated blood pressure Respiratory: Normal respiratory effort.  No retractions. Lungs CTAB. Gastrointestinal: Soft and nontender. No distention. No abdominal bruits. No CVA tenderness. Genitourinary: Deferred Musculoskeletal: No lower extremity tenderness nor edema.  No joint effusions. Neurologic:  Normal speech and language. No gross focal neurologic deficits are appreciated. No gait instability. Skin:  Skin is warm, dry and intact. No rash noted. Psychiatric: Mood and affect are normal. Speech and behavior are normal.  ____________________________________________   LABS (all labs ordered are listed, but only abnormal results are displayed)  Labs Reviewed - No data to display ____________________________________________  EKG   ____________________________________________  RADIOLOGY I, Sable Feil, personally viewed and evaluated these images (plain radiographs) as part of my medical decision making, as well as reviewing the written report by the radiologist.  ED MD interpretation:    Official radiology report(s): CT Head Wo Contrast  Result Date: 01/29/2021 CLINICAL DATA:  Headache, intracranial hemorrhage suspected. Additional history provided: Fall on Wednesday with head trauma, patient denies loss of consciousness, patient reports dull headaches since the fall. EXAM: CT HEAD WITHOUT CONTRAST TECHNIQUE: Contiguous axial images were obtained from the base of the skull through the vertex without intravenous contrast. COMPARISON:  Head CT 10/09/2020. FINDINGS: Brain: Sequela of prior right pterional craniotomy with multiple aneurysm clips in the right paraclinoid region and associated streak and beam hardening artifact. Redemonstrated adjacent encephalomalacia/gliosis  within the inferior right basal ganglia and anteromedial right temporal lobe. Mild cerebral and cerebellar atrophy. Moderate ill-defined hypoattenuation within the cerebral white matter is nonspecific, but compatible with chronic small vessel ischemic disease. Redemonstrated small chronic infarct within the left cerebellar hemisphere. There is no acute intracranial hemorrhage. No acute demarcated cortical infarct. No extra-axial fluid collection. No evidence of intracranial mass. No midline shift. Vascular: No appreciable hyperdense vessel. Atherosclerotic calcifications. Skull: Prior right pterional craniotomy. No calvarial fracture or focal suspicious osseous lesion. Sinuses/Orbits: Visualized orbits show no acute finding. No significant paranasal sinus disease at the imaged levels. IMPRESSION: No evidence of acute intracranial abnormality. Sequela of prior right pterional craniotomy and  aneurysm clipping. Redemonstrated chronic encephalomalacia/gliosis in the adjacent inferior right basal ganglia and right temporal lobe. Stable background mild generalized parenchymal atrophy and moderate cerebral white matter chronic small vessel ischemic disease. Redemonstrated small chronic infarct within the left cerebellar hemisphere. Electronically Signed   By: Kellie Simmering DO   On: 01/29/2021 13:01    ____________________________________________   PROCEDURES  Procedure(s) performed (including Critical Care):  Procedures   ____________________________________________   INITIAL IMPRESSION / ASSESSMENT AND PLAN / ED COURSE  As part of my medical decision making, I reviewed the following data within the [place]         Patient presents with frontal headache secondary to a contusion from a slip and fall which occurred approximately a week ago. There was no LOC. Gust CT findings with patient revealing no acute abnormalities. Patient complaining physical exam consistent with minor head  injury. Patient advised extra strength Tylenol as needed for headache and follow-up with PCP. Return back to ED if condition worsens.      ____________________________________________   FINAL CLINICAL IMPRESSION(S) / ED DIAGNOSES  Final diagnoses:  Minor head injury, initial encounter     ED Discharge Orders    None      *Please note:  AIRICA SCHWARTZKOPF was evaluated in Emergency Department on 01/29/2021 for the symptoms described in the history of present illness. She was evaluated in the context of the global COVID-19 pandemic, which necessitated consideration that the patient might be at risk for infection with the SARS-CoV-2 virus that causes COVID-19. Institutional protocols and algorithms that pertain to the evaluation of patients at risk for COVID-19 are in a state of rapid change based on information released by regulatory bodies including the CDC and federal and state organizations. These policies and algorithms were followed during the patient's care in the ED.  Some ED evaluations and interventions may be delayed as a result of limited staffing during and the pandemic.*   Note:  This document was prepared using Dragon voice recognition software and may include unintentional dictation errors.    Aalaiyah, Yassin, PA-C 01/29/21 1341    Naaman Plummer, MD 01/29/21 [phone]

## 2021-01-29 NOTE — ED Triage Notes (Addendum)
Patient presents to the ED post fall last Wednesday.  Patient slipped on a rug in her bathroom and hit her head.  Patient denies losing consciousness.    Patient states she has been having dull headaches since the fall.  Patient is alert and oriented x 4.  Patient states ibuprofen is not helping with her headaches.  Patient denies any other issues since the fall, including cognitive issues.  Patient states, "I'm just afraid to go in the bathroom, of course we got rid of the rug."  Patient does not take blood thinners.

## 2021-01-29 NOTE — ED Notes (Signed)
Pt states she slipped and fell Wednesday or Thursday of last week and fell into the shower hitting the back of her head on the shower wall, denies LOC. States "I just dont feel good", states she has been having HA since the fall, denies any blurred vision or dizziness. Pt is a/ox4 at present.

## 2021-02-05 ENCOUNTER — Telehealth: Payer: Self-pay | Admitting: *Deleted

## 2021-02-05 MED ORDER — SOLIFENACIN SUCCINATE 10 MG PO TABS: 10.0000 mg | ORAL_TABLET | Freq: Every day | ORAL | 1 refills | Status: DC

## 2021-02-05 NOTE — Telephone Encounter (Signed)
I sent vesicare to her pharmacy  Please make her aware of possible side effects of dry mouth, constipation, sedation  If problems stop and let us know  If not helpful in 1-2 weeks stop and let us know  Thanks

## 2021-02-05 NOTE — Telephone Encounter (Signed)
Pt notified Rx sent into pharmacy. Pt also advised of Dr. Marliss Coots instructions/recommendations and verbalized understanding. She will keep [place] posted

## 2021-02-05 NOTE — Telephone Encounter (Signed)
Patient wants to know if Dr. Glori Bickers will send in a prescription for her bladder problem. Pharmacy -[facility]

## 2021-02-27 DIAGNOSIS — M79674 Pain in right toe(s): Secondary | ICD-10-CM | POA: Diagnosis not present

## 2021-02-27 DIAGNOSIS — B351 Tinea unguium: Secondary | ICD-10-CM | POA: Diagnosis not present

## 2021-02-27 DIAGNOSIS — M79675 Pain in left toe(s): Secondary | ICD-10-CM | POA: Diagnosis not present

## 2021-03-13 ENCOUNTER — Telehealth: Payer: Self-pay | Admitting: *Deleted

## 2021-03-13 NOTE — Telephone Encounter (Signed)
I'm glad she is ok.  She may have contused the tailbone area (or less likely fractured it).  Use a cold compress or ice if able for 10 minutes at a time.  A round pillow that offloads the area (like a donut pillow for hemorrhoids) is helpful to make sitting more comfortable.  If no improvement please follow up

## 2021-03-13 NOTE — Telephone Encounter (Signed)
Called pt and no answer and no VM (kept ringing) 

## 2021-03-13 NOTE — Telephone Encounter (Signed)
Patient called stating that she was bending over Tuesday and felt backwards. Patient stated that her tailbone has been real sore. Patient stated that she can hardly sit down. Patient stated that she has been taking tylenol which has not helped much. Patient wants to know what Dr. Glori Bickers would recommend. Patient stated that the rescue unit came and helped get her up and her vital signs were normal.

## 2021-03-18 NOTE — Telephone Encounter (Signed)
Patient notified as instructed by telephone and verbalized understanding. Patient stated that she has a lady that sits with her and she has not been with her because of Easter. Patient stated that she is going to try to reach out to her and see if she can get her the round pillow and bring it to her. Patient was advised to call the office for an appointment if she does not improve and she verbalized understanding.

## 2021-04-03 ENCOUNTER — Ambulatory Visit (INDEPENDENT_AMBULATORY_CARE_PROVIDER_SITE_OTHER)
Admission: RE | Admit: 2021-04-03 | Discharge: 2021-04-03 | Disposition: A | Discharge status: Home / Self Care | Payer: Medicare Other | Source: Ambulatory Visit | Attending: Family Medicine | Admitting: Family Medicine

## 2021-04-03 ENCOUNTER — Encounter: Payer: Self-pay | Admitting: Family Medicine

## 2021-04-03 ENCOUNTER — Other Ambulatory Visit: Payer: Self-pay

## 2021-04-03 ENCOUNTER — Ambulatory Visit (INDEPENDENT_AMBULATORY_CARE_PROVIDER_SITE_OTHER): Payer: Medicare Other | Admitting: Family Medicine

## 2021-04-03 VITALS — BP 126/70 | HR 64 | Temp 97.3°F | Ht 62.0 in | Wt 175.1 lb

## 2021-04-03 DIAGNOSIS — Z7409 Other reduced mobility: Secondary | ICD-10-CM | POA: Diagnosis not present

## 2021-04-03 DIAGNOSIS — M533 Sacrococcygeal disorders, not elsewhere classified: Secondary | ICD-10-CM | POA: Diagnosis not present

## 2021-04-03 DIAGNOSIS — M4186 Other forms of scoliosis, lumbar region: Secondary | ICD-10-CM | POA: Diagnosis not present

## 2021-04-03 DIAGNOSIS — M810 Age-related osteoporosis without current pathological fracture: Secondary | ICD-10-CM | POA: Diagnosis not present

## 2021-04-03 DIAGNOSIS — Z636 Dependent relative needing care at home: Secondary | ICD-10-CM | POA: Diagnosis not present

## 2021-04-03 DIAGNOSIS — E781 Pure hyperglyceridemia: Secondary | ICD-10-CM | POA: Diagnosis not present

## 2021-04-03 DIAGNOSIS — E559 Vitamin D deficiency, unspecified: Secondary | ICD-10-CM | POA: Diagnosis not present

## 2021-04-03 DIAGNOSIS — M47816 Spondylosis without myelopathy or radiculopathy, lumbar region: Secondary | ICD-10-CM | POA: Diagnosis not present

## 2021-04-03 NOTE — Assessment & Plan Note (Signed)
Pt lives in a retirement community with husband who has dementia (worsening)  She cannot leave his side and is becoming unable to care for herself she worries about safety and he wanders at night May need to search for a residence where he she can get help and/or with memory care option for her  Ref pt for community care to help her with caregiver stress and burn out

## 2021-04-03 NOTE — Assessment & Plan Note (Signed)
Recent fall with tailbone pain  Xray pending  Declines dexa

## 2021-04-03 NOTE — Assessment & Plan Note (Signed)
Discussed fall prevention (she is high risk) and has fallen multiple times Continues to use rolling walker

## 2021-04-03 NOTE — Assessment & Plan Note (Signed)
S/p fall last month Still tender Affects sitting  Xray ordered Enc use of ice pack Also off loading cushion  Tylenol for pain prn

## 2021-04-03 NOTE — Progress Notes (Signed)
Subjective:    Patient ID: Julie [name], female    DOB: 06-26-1932, 87 y.o.   MRN: [id number]  This visit occurred during the SARS-CoV-2 public health emergency.  Safety protocols were in place, including screening questions prior to the visit, additional usage of staff PPE, and extensive cleaning of exam room while observing appropriate contact time as indicated for disinfecting solutions.    HPI Pt presents with back/buttock pain after falling on tailbone   Wt Readings from Last 3 Encounters:  04/03/21 175 lb 2 oz (79.4 kg)  01/29/21 179 lb (81.2 kg)  01/22/21 179 lb 8 oz (81.4 kg)   32.03 kg/m   Pt called on 4/13 stating that she bend over on Tuesday and fell backwards Tailbone was very sore (it hurt badly to start)   rescure unit came and vitals were stable Adv use of cold compress and offsetting pillow if helpful  She did use ice pack a few times  Still hurts but not like it did  Got a special cushion   Did not note swelling  Could not see bruising  Takes tylenol   Back does not hurt / just tailbone   She had multiple risk factors for falling incl movement disorder Also osteoporosis  Had a recent episode of recurrent diarrhea - pepto helps with that  IBS   Still has chronic bladder issues -still takes vesicare and it does not   Had a housecalls visit  Noted no labs for a while   Cares for husband with dementia  Very hard  In retirement community but he is worse and may need to be in memory care    Patient Active Problem List   Diagnosis Date Noted  . Tail bone pain 04/03/2021  . Urge incontinence 01/22/2021  . Pedal edema 01/22/2021  . Thoracic back pain 10/23/2020  . Left elbow contusion 10/23/2020  . Mobility impaired 05/14/2020  . Spinal stenosis of lumbar region 02/08/2020  . Caregiver stress 02/08/2020  . Urinary frequency 02/08/2020  . Change of skin color 08/31/2018  . Encounter for screening mammogram for breast cancer 08/24/2017  .  Parkinsonism ([place]) 08/24/2017  . Routine general medical examination at a health care facility 08/08/2016  . Left knee pain 02/01/2016  . Total knee replacement status 10/15/2015  . B12 deficiency 07/16/2015  . Peripheral neuropathy 07/16/2015  . 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 Phalanx fracture, foot 01/10/2015  . Colon cancer screening 09/22/2014  . Osteoarthritis of left lower extremity 09/09/2014  . History of falling 04/26/2014  . Poor balance 04/26/2014  . Left-sided weakness 04/26/2014  . Encounter for Medicare annual wellness exam 07/13/2013  . Risk for falls 07/13/2013  . Skin tag of labia 03/14/2013  . Weakness of left leg 07/30/2011  . BACK PAIN, LUMBAR 12/10/2010  . Vitamin D deficiency 08/16/2010  . GERD 03/26/2010  . DYSPEPSIA 03/26/2010  . IRRITABLE BOWEL SYNDROME 03/26/2010  . Osteoporosis 08/27/2009  . Hypertriglyceridemia 07/24/2009  . DIVERTICULOSIS, COLON 07/24/2009  . OSTEOARTHRITIS 07/24/2009  . COLONIC POLYPS, HX OF 07/24/2009  . POSTMENOPAUSAL STATUS 07/24/2009   Past Medical History:  Diagnosis Date  . Adenoma   . Chronic leg pain   . Colon polyp   . DDD (degenerative disc disease), cervical   . Diverticulosis of colon    internal----Dr. Vira Agar  . Duodenitis   . Esophagitis   . GERD (gastroesophageal reflux disease)   . Hemorrhoids   . HLD (hyperlipidemia)   . IBS (irritable bowel syndrome)  with PP diarrhea  . Neuropathy   . Neuropathy   . OA (osteoarthritis) of knee   . Osteopenia   . PONV (postoperative nausea and vomiting)   . Scoliosis   . Seborrheic dermatitis   . Shortness of breath dyspnea    with activity  . TMJ syndrome   . Trochanteric bursitis   . Unsteady gait    FALLS EASILY   Past Surgical History:  Procedure Laterality Date  . CARPAL TUNNEL RELEASE Left 01/18/2013   Procedure: CARPAL TUNNEL RELEASE;  Surgeon: Wynonia Sours, MD;  Location: [facility];  Service: Orthopedics;  Laterality: Left;  OSTEOTOMY LEFT  DISTAL RADIUS BONE CHIPS CARPAL TUNNEL RELEASE LEFT   . CATARACT EXTRACTION W/PHACO Right 08/07/2015   Procedure: CATARACT EXTRACTION PHACO AND INTRAOCULAR LENS PLACEMENT (IOC);  Surgeon: Birder Robson, MD;  Location: [facility];  Service: Ophthalmology;  Laterality: Right;  [place] 00:48.0 AP   22.3 CDE 10.69 casette lot # [id number]  . CATARACT EXTRACTION W/PHACO Left 09/04/2015   Procedure: CATARACT EXTRACTION PHACO AND INTRAOCULAR LENS PLACEMENT (IOC);  Surgeon: Birder Robson, MD;  Location: [facility];  Service: Ophthalmology;  Laterality: Left;  [place] AP CDE FLUID LOT # [id number]  . CEREBRAL ANEURYSM REPAIR  2001   clamps  . COLONOSCOPY  1/11   polyps-hyperplastic and adenomatous  . FOOT SURGERY  2010   bilateral hammer toes and "knot"  . FRACTURE SURGERY Right 01/2014   result of a fall  . FRACTURE SURGERY Left 03/2014   result of a fall  . HEMORRHOID SURGERY    . KNEE ARTHROPLASTY Left 10/15/2015   Procedure: COMPUTER ASSISTED TOTAL KNEE ARTHROPLASTY;  Surgeon: Dereck Leep, MD;  Location: [facility];  Service: Orthopedics;  Laterality: Left;  . OPEN REDUCTION INTERNAL FIXATION (ORIF) DISTAL RADIAL FRACTURE Right 02/13/2015   Procedure: OPEN REDUCTION INTERNAL FIXATION (ORIF) RIGHT DISTAL RADIUS;  Surgeon: Daryll Brod, MD;  Location: [facility];  Service: Orthopedics;  Laterality: Right;  . OPEN REDUCTION INTERNAL FIXATION (ORIF) FINGER WITH RADIAL BONE GRAFT Left 11/01/2013   Procedure: OPEN REDUCTION INTERNAL FIXATION (ORIF) LEFT SMALL FINGER;  Surgeon: Wynonia Sours, MD;  Location: [facility];  Service: Orthopedics;  Laterality: Left;  . REPLACEMENT TOTAL KNEE Left   . TOTAL ABDOMINAL HYSTERECTOMY    . WRIST OSTEOTOMY Left 01/18/2013   Procedure: WRIST OSTEOTOMY;  Surgeon: Wynonia Sours, MD;  Location: [facility];  Service: Orthopedics;  Laterality: Left;   Social History   Tobacco Use  . Smoking status: Former Smoker    Quit date: 10/02/1954     Years since quitting: 66.5  . Smokeless tobacco: Never Used  Vaping Use  . Vaping Use: Never used  Substance Use Topics  . Alcohol use: No    Alcohol/week: 0.0 standard drinks  . Drug use: No   Family History  Problem Relation Age of Onset  . Colon cancer Mother   . Osteoporosis Other        hip fracture  . Stroke Brother   . Colon cancer Brother   . Hypertension Brother   . Other Brother        Heart problem  . Colon cancer Brother   . Colon cancer Maternal Aunt   . Breast cancer Neg Hx    Allergies  Allergen Reactions  . Codeine     REACTION: headache and nausea and vomiting   Current Outpatient Medications on File Prior to Visit  Medication Sig Dispense Refill  . acetaminophen (TYLENOL) 500 MG tablet Take 500 mg by mouth every 6 (six) hours as needed.    . Alum Hydroxide-Mag Carbonate 160-105 MG CHEW Chew 1 tablet by mouth as needed (acid reflux).    . gabapentin (NEURONTIN) 300 MG capsule TAKE 1 CAPSULE BY MOUTH AT BEDTIME 90 capsule 3  . IBUPROFEN PO Take by mouth.    . loperamide (IMODIUM) 2 MG capsule Take by mouth as needed for diarrhea or loose stools.    . solifenacin (VESICARE) 10 MG tablet Take 1 tablet (10 mg total) by mouth daily. 30 tablet 1   No current facility-administered medications on file prior to visit.    Review of Systems  Constitutional: Positive for fatigue. Negative for activity change, appetite change, fever and unexpected weight change.  HENT: Negative for congestion, ear pain, rhinorrhea, sinus pressure and sore throat.   Eyes: Negative for pain, redness and visual disturbance.  Respiratory: Negative for cough, shortness of breath and wheezing.   Cardiovascular: Negative for chest pain and palpitations.  Gastrointestinal: Negative for abdominal pain, blood in stool, constipation and diarrhea.  Endocrine: Negative for polydipsia and polyuria.  Genitourinary: Negative for dysuria, frequency and urgency.  Musculoskeletal: Positive for  arthralgias, back pain and gait problem. Negative for joint swelling and myalgias.  Skin: Negative for pallor and rash.  Allergic/Immunologic: Negative for environmental allergies.  Neurological: Positive for tremors. Negative for dizziness, syncope and headaches.       Poor balance with falls  H/o movement disorder  Hematological: Negative for adenopathy. Does not bruise/bleed easily.  Psychiatric/Behavioral: Positive for dysphoric mood. Negative for decreased concentration. The patient is not nervous/anxious.        Caregiver stress       Objective:   Physical Exam Constitutional:      General: She is not in acute distress.    Appearance: Normal appearance. She is well-developed. She is obese. She is not ill-appearing.     Comments: Frail appearing  HENT:     Head: Normocephalic and atraumatic.     Mouth/Throat:     Mouth: Mucous membranes are moist.  Eyes:     General: No scleral icterus.    Conjunctiva/sclera: Conjunctivae normal.     Pupils: Pupils are equal, round, and reactive to light.  Neck:     Thyroid: No thyromegaly.     Vascular: No carotid bruit or JVD.  Cardiovascular:     Rate and Rhythm: Normal rate and regular rhythm.     Heart sounds: Normal heart sounds. No gallop.   Pulmonary:     Effort: Pulmonary effort is normal. No respiratory distress.     Breath sounds: Normal breath sounds. No wheezing or rales.  Abdominal:     General: Bowel sounds are normal. There is no distension or abdominal bruit.     Palpations: Abdomen is soft. There is no mass.     Tenderness: There is no abdominal tenderness.  Musculoskeletal:        General: Tenderness present.     Cervical back: Normal range of motion and neck supple. No tenderness.     Lumbar back: Bony tenderness present. No deformity. Decreased range of motion.     Right lower leg: No edema.     Left lower leg: No edema.     Comments: Tender over lower sacrum and coccyx No swelling or bruising or skin  change Pain with sitting    Lymphadenopathy:     Cervical:  No cervical adenopathy.  Skin:    General: Skin is warm and dry.     Findings: No rash.  Neurological:     Mental Status: She is alert.     Cranial Nerves: No cranial nerve deficit.     Deep Tendon Reflexes: Reflexes are normal and symmetric.     Comments: Generally weak No focal areas of weakness  Unsteady gait  Improved with walker   Psychiatric:        Mood and Affect: Mood is depressed.        Cognition and Memory: Cognition and memory normal.     Comments: Seems generally down and tired Discusses caregiving stress candidly           Assessment & Plan:   Problem List Items Addressed This Visit      Musculoskeletal and Integument   Osteoporosis    Recent fall with tailbone pain  Xray pending  Declines dexa        Other   Vitamin D deficiency    D level today In setting of bone loss Disc imp to bone and overall health      Relevant Orders   VITAMIN D 25 Hydroxy (Vit-D Deficiency, Fractures)   Hypertriglyceridemia    Overdue for labs  Disc goals for lipids and reasons to control them Rev last labs with pt Rev low sat fat diet in detail Per pt diet is not great/she dislikes food at her residence      Relevant Orders   Comprehensive metabolic panel   Lipid panel   Caregiver stress    Pt lives in a retirement community with husband who has dementia (worsening)  She cannot leave his side and is becoming unable to care for herself she worries about safety and he wanders at night May need to search for a residence where he she can get help and/or with memory care option for her  Ref pt for community care to help her with caregiver stress and burn out      Relevant Orders   AMB Referral to [facility]   Mobility impaired    Discussed fall prevention (she is high risk) and has fallen multiple times Continues to use rolling walker      Tail bone pain - Primary    S/p fall last  month Still tender Affects sitting  Xray ordered Enc use of ice pack Also off loading cushion  Tylenol for pain prn      Relevant Orders   DG Sacrum/Coccyx

## 2021-04-03 NOTE — Patient Instructions (Addendum)
If vesicare does not help - go ahead and stop it   Continue to use ice pack and tylenol for your tail bone pain  Use your cushion  Let's get an xray today   Also due for labs   Take care of yourself

## 2021-04-03 NOTE — Assessment & Plan Note (Signed)
D level today In setting of bone loss Disc imp to bone and overall health

## 2021-04-03 NOTE — Assessment & Plan Note (Signed)
Overdue for labs  Disc goals for lipids and reasons to control them Rev last labs with pt Rev low sat fat diet in detail Per pt diet is not great/she dislikes food at her residence

## 2021-04-04 ENCOUNTER — Telehealth: Payer: Self-pay | Admitting: *Deleted

## 2021-04-04 LAB — COMPREHENSIVE METABOLIC PANEL
ALT: 6 U/L (ref 0–35)
AST: 12 U/L (ref 0–37)
Albumin: 3.9 g/dL (ref 3.5–5.2)
Alkaline Phosphatase: 80 U/L (ref 39–117)
BUN: 18 mg/dL (ref 6–23)
CO2: 27 mEq/L (ref 19–32)
Calcium: 9.3 mg/dL (ref 8.4–10.5)
Chloride: 106 mEq/L (ref 96–112)
Creatinine, Ser: 0.95 mg/dL (ref 0.40–1.20)
GFR: 53.94 mL/min — ABNORMAL LOW (ref >60.00)
Glucose, Bld: 82 mg/dL (ref 70–99)
Potassium: 4.1 mEq/L (ref 3.5–5.1)
Sodium: 140 mEq/L (ref 135–145)
Total Bilirubin: 0.4 mg/dL (ref 0.2–1.2)
Total Protein: 7.3 g/dL (ref 6.0–8.3)

## 2021-04-04 LAB — LIPID PANEL
Cholesterol: 194 mg/dL (ref 0–200)
HDL: 45.4 mg/dL (ref >39.00)
NonHDL: 148.42
Total CHOL/HDL Ratio: 4
Triglycerides: 204 mg/dL — ABNORMAL HIGH (ref 0.0–149.0)
VLDL: 40.8 mg/dL — ABNORMAL HIGH (ref 0.0–40.0)

## 2021-04-04 LAB — LDL CHOLESTEROL, DIRECT: Direct LDL: 117 mg/dL

## 2021-04-04 LAB — VITAMIN D 25 HYDROXY (VIT D DEFICIENCY, FRACTURES): VITD: 24.2 ng/mL — ABNORMAL LOW (ref 30.00–100.00)

## 2021-04-04 NOTE — Chronic Care Management (AMB) (Signed)
  Chronic Care Management   Note  04/04/2021 Name: Julie [name] MRN: [id number] DOB: 1932-11-01  Julie [name] is a 87 y.o. year old female who is a primary care patient of Tower, [name], MD. I reached out to Julie [name] by phone today in response to a referral sent by Ms. Julie Saa [name]'s PCP, Tower, [name], MD.     Julie [name] was given information about Chronic Care Management services today including:  1. CCM service includes personalized support from designated clinical staff supervised by her physician, including individualized plan of care and coordination with other care providers 2. 24/7 contact phone numbers for assistance for urgent and routine care needs. 3. Service will only be billed when office clinical staff spend 20 minutes or more in a month to coordinate care. 4. Only one practitioner may furnish and bill the service in a calendar month. 5. The patient may stop CCM services at any time (effective at the end of the month) by phone call to the office staff. 6. The patient will be responsible for cost sharing (co-pay) of up to 20% of the service fee (after annual deductible is met).  Patient agreed to services and verbal consent obtained.   Follow up plan: Telephone appointment with care management team member scheduled for:04/10/2021  [place] Management

## 2021-04-04 NOTE — Chronic Care Management (AMB) (Signed)
  Chronic Care Management   Outreach Note  04/04/2021 Name: ADDALINE PEPLINSKI MRN: [id number] DOB: [birth year]  Judi Saa Goltz is a 87 y.o. year old female who is a primary care patient of Tower, [name], MD. I reached out to [name] by phone today in response to a referral sent by Ms. Judi Saa Quizhpi's PCP, Tower, [name], MD.     An unsuccessful telephone outreach was attempted today. The patient was referred to the case management team for assistance with care management and care coordination.   Follow Up Plan: A HIPAA compliant phone message was left for the patient providing contact information and requesting a return call. The care management team will reach out to the patient again over the next 5 days. If patient returns call to provider office, please advise to call [place] at [phone].  [place] Management

## 2021-04-05 ENCOUNTER — Telehealth: Payer: Self-pay | Admitting: *Deleted

## 2021-04-05 NOTE — Telephone Encounter (Signed)
Left VM requesting pt to call the office back 

## 2021-04-05 NOTE — Telephone Encounter (Signed)
-----   Message from Abner Greenspan, MD sent at 04/04/2021  8:24 PM EDT ----- Vitamin D level is low  Lipids are fairly stable  Is she taking any vit D and if so how much?

## 2021-04-08 NOTE — Telephone Encounter (Signed)
Xray is also back  Left 2nd VM requesting pt to call the office back

## 2021-04-10 ENCOUNTER — Ambulatory Visit (INDEPENDENT_AMBULATORY_CARE_PROVIDER_SITE_OTHER): Payer: Medicare Other | Admitting: *Deleted

## 2021-04-10 DIAGNOSIS — M810 Age-related osteoporosis without current pathological fracture: Secondary | ICD-10-CM | POA: Diagnosis not present

## 2021-04-10 DIAGNOSIS — Z9181 History of falling: Secondary | ICD-10-CM

## 2021-04-10 DIAGNOSIS — Z636 Dependent relative needing care at home: Secondary | ICD-10-CM

## 2021-04-10 NOTE — Telephone Encounter (Signed)
Address through result notes

## 2021-04-12 ENCOUNTER — Ambulatory Visit: Payer: Medicare Other

## 2021-04-12 NOTE — Chronic Care Management (AMB) (Signed)
Chronic Care Management    Clinical Social Work Note  04/12/2021 Name: Julie [name] MRN: [id number] DOB: [birth year]  Julie [name] is a 88 y.o. year old female who is a primary care patient of Tower, [name], MD. The CCM team was consulted to assist the patient with chronic disease management and/or care coordination needs related to: [employer]  and Caregiver Stress.   Engaged with patient by telephone for initial visit in response to provider referral for social work chronic care management and care coordination services.   Consent to Services:  The patient was given information about Chronic Care Management services, agreed to services, and gave verbal consent prior to initiation of services.  Please see initial visit note for detailed documentation.   Patient agreed to services and consent obtained.   Assessment: Review of patient past medical history, allergies, medications, and health status, including review of relevant consultants reports was performed today as part of a comprehensive evaluation and provision of chronic care management and care coordination services.     SDOH (Social Determinants of Health) assessments and interventions performed:    Advanced Directives Status: See Care Plan for related entries.  CCM Care Plan  Allergies  Allergen Reactions  . Codeine     REACTION: headache and nausea and vomiting    Outpatient Encounter Medications as of 04/10/2021  Medication Sig  . acetaminophen (TYLENOL) 500 MG tablet Take 500 mg by mouth every 6 (six) hours as needed.  . Alum Hydroxide-Mag Carbonate 160-105 MG CHEW Chew 1 tablet by mouth as needed (acid reflux).  . Cholecalciferol (SM VITAMIN D3) 100 MCG (4000 UT) CAPS Take 4,000 Units by mouth daily.  Marland Kitchen gabapentin (NEURONTIN) 300 MG capsule TAKE 1 CAPSULE BY MOUTH AT BEDTIME  . IBUPROFEN PO Take by mouth.  . loperamide (IMODIUM) 2 MG capsule Take by mouth as needed for diarrhea or loose stools.  .  solifenacin (VESICARE) 10 MG tablet Take 1 tablet (10 mg total) by mouth daily.   No facility-administered encounter medications on file as of 04/10/2021.    Patient Active Problem List   Diagnosis Date Noted  . Tail bone pain 04/03/2021  . Urge incontinence 01/22/2021  . Pedal edema 01/22/2021  . Thoracic back pain 10/23/2020  . Left elbow contusion 10/23/2020  . Mobility impaired 05/14/2020  . Spinal stenosis of lumbar region 02/08/2020  . Caregiver stress 02/08/2020  . Urinary frequency 02/08/2020  . Change of skin color 08/31/2018  . Encounter for screening mammogram for breast cancer 08/24/2017  . Parkinsonism ([place]) 08/24/2017  . Routine general medical examination at a health care facility 08/08/2016  . Left knee pain 02/01/2016  . Total knee replacement status 10/15/2015  . B12 deficiency 07/16/2015  . Peripheral neuropathy 07/16/2015  . 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 Phalanx fracture, foot 01/10/2015  . Colon cancer screening 09/22/2014  . Osteoarthritis of left lower extremity 09/09/2014  . History of falling 04/26/2014  . Poor balance 04/26/2014  . Left-sided weakness 04/26/2014  . Encounter for Medicare annual wellness exam 07/13/2013  . Risk for falls 07/13/2013  . Skin tag of labia 03/14/2013  . Weakness of left leg 07/30/2011  . BACK PAIN, LUMBAR 12/10/2010  . Vitamin D deficiency 08/16/2010  . GERD 03/26/2010  . DYSPEPSIA 03/26/2010  . IRRITABLE BOWEL SYNDROME 03/26/2010  . Osteoporosis 08/27/2009  . Hypertriglyceridemia 07/24/2009  . DIVERTICULOSIS, COLON 07/24/2009  . OSTEOARTHRITIS 07/24/2009  . COLONIC POLYPS, HX OF 07/24/2009  . POSTMENOPAUSAL STATUS 07/24/2009  Conditions to be addressed/monitored: Anxiety and caregiver stress; Transportation and Lacks knowledge of community resources  Care Plan : LCSW Plan of Care  Updates made by Deirdre Peer, LCSW since 04/12/2021 12:00 AM    Problem: Caregiver Stress due to spouse's cognitive decline    Priority: High  Note:   Current barriers:   . Patient in need of assistance with connecting to community resources for Limited social support, Transportation, Lacks knowledge of community resources to aide in caregiving for spouse with dementia in the home, and overall long term care planning needs . Acknowledges deficits with meeting this unmet need . Patient is unable to independently navigate community resource options without care coordination support Clinical Goals:  -patient will work with SW to address concerns related to caregiver strain/stress and resources/support available to aide in caring for spouse -patient will follow up with community resources provided (via mail and other) as directed by SW Clinical Interventions:  CSW made initial outreach call to pt and spoke with her about the stress she is under as caregiver to her husband with dementia. Per pt, she and her husband of 26 years moved to [place] in October 2021. They have 3 meals provided daily in the dining hall, have activities and some transportation support. Per pt, her husband is fairly independent and able to with his ADL's but does require supervision; as well he "doesn't want me out of his sight" so she has little time alone.  CSW talked at length with pt about resources; their income is too high for Medicaid. She does not want to pursue any sort of ALF/memory care placement for him at this time.  She does have some hired assistance for helping her to run errands as she is not driving due to vision issues post aneurysm.  CSW discussed possible increasing their private pay support to include some in home support for pt's spouse so pt can have some relief/breaks.   CSW also suggested conversations with extended family; his daughter is 82 and her daughter is her 92- she has given me permission to speak to both and provided me with their numbers-  CSW was able to make contact with  pt's daughter in law, [name] (HCPOA to Mr Battaglia) who lives in [place]. She shared that they are looking into getting some additional in home caregiver support to help with needs for Mr Gutkowski. She also shared that there are some local family members that live nearby who could help in an emergency; pt only mentioned out of town family.  CSW talked with daughter in law about potential resources that may help; including those discussed with pt as well as some websites/you-tube sites for support/educational/insight that may be helpful for dealing with dementia; behaviors/communication/etc.  Pt's daughter in law reassured CSW that the family is involved, working together well (with the blended family) and feels they are on a good path for planning and providing the care/needs for both.   CSW will plan to reach out to pt's daughter next week as she is on vacation per family.   . Collaboration with Tower, [name], MD regarding development and update of comprehensive plan of care as evidenced by provider attestation and co-signature . Inter-disciplinary care team collaboration (see longitudinal plan of care) . Assessment of needs, barriers , agencies contacted, as well as how impacting  . Review various resources, discussed options and provided patient information about Limited social support, Level of care concerns, Social Isolation,  Limited access to caregiver, and Leodis Liverpool knowledge of community resources . Collaborated with appropriate clinical care team members regarding patient needs Lacks knowledge of community resource:s .  Anxiety and caregiver stress . Patient interviewed and appropriate assessments performed . Referred patient to community resources care guide team for assistance with Adult Day Care, transportation, PACE and in-home assistance/caregiver support services . Provided patient with information about private duty care, long term care facility options, community program/services . Discussed  plans with patient for ongoing care management follow up and provided patient with direct contact information for care management team . Consult placed to Care Guide for follow up with community resource linking for transportation, Adult Day Care, PACE, [employer] program, etc . Advised patient to review resources, discuss with family plans for long term planning as well as for current and "in case of emergency"  . Collaborated with primary care provider  . Assisted patient/caregiver with obtaining information about health plan benefits- not eligible for Medicaid  . Provided education to patient/caregiver regarding level of care options. . Other interventions provided: o Solution-Focused Strategies, Active listening / Reflection utilized , Emotional Supportive Provided, Problem Solving /Task Center , Motivational Interviewing, Caregiver stress acknowledged , Participation in support group encouraged , and Consideration of in-home help encouraged  Patient Goals:  -review Civil engineer, contracting (mailed to you) for consideration -discuss long term plans with family -consider hiring more in-home private pay care  - begin a notebook of services in my neighborhood or community - call 211 when I need some help - follow-up on any referrals for help I am given - think ahead to make sure my need does not become an emergency - make a note about what I need to have by the phone or take with me, like an identification card or social security number have a back-up plan - have a back-up plan - make a list of family or friends that I can call  -  Follow Up Plan: Appointment scheduled for SW follow up with client by phone on: 05/03/2021      Follow Up Plan: Appointment scheduled for SW follow up with client by phone on: 05/03/2021      Eduard Clos MSW, [place] Licensed Clinical Social Worker [place] [phone]

## 2021-04-12 NOTE — Patient Instructions (Signed)
Visit Information   PATIENT GOALS:  Goals Addressed            This Visit's Progress   . Find Help in My Community       Timeframe:  Long-Range Goal Priority:  High Start Date:    04/10/2021                          Expected End Date:    06/30/2021                   Follow Up Date 05/03/2021   -review Civil engineer, contracting (mailed to you) for consideration -discuss long term plans with family -consider hiring more in-home private pay care  - begin a notebook of services in my neighborhood or community - call 211 when I need some help - follow-up on any referrals for help I am given - think ahead to make sure my need does not become an emergency - make a note about what I need to have by the phone or take with me, like an identification card or social security number have a back-up plan - have a back-up plan - make a list of family or friends that I can call    Why is this important?    Knowing how and where to find help for yourself or family in your neighborhood and community is an important skill.   You will want to take some steps to learn how.    Notes:        Consent to CCM Services: Ms. Amborn was given information about Chronic Care Management services today including:  1. CCM service includes personalized support from designated clinical staff supervised by her physician, including individualized plan of care and coordination with other care providers 2. 24/7 contact phone numbers for assistance for urgent and routine care needs. 3. Service will only be billed when office clinical staff spend 20 minutes or more in a month to coordinate care. 4. Only one practitioner may furnish and bill the service in a calendar month. 5. The patient may stop CCM services at any time (effective at the end of the month) by phone call to the office staff. 6. The patient will be responsible for cost sharing (co-pay) of up to 20% of the service fee (after annual deductible is  met).  Patient agreed to services and verbal consent obtained.   The patient verbalized understanding of instructions, educational materials, and care plan provided today and declined offer to receive copy of patient instructions, educational materials, and care plan.   Telephone follow up appointment with care management team member scheduled for:05/03/21  Eduard Clos MSW, LCSW Licensed Clinical Social Worker [facility] [phone]  CLINICAL CARE PLAN: Patient Care Plan: LCSW Plan of Care    Problem Identified: Caregiver Stress due to spouse's cognitive decline   Priority: High  Note:   Current barriers:   . Patient in need of assistance with connecting to community resources for Limited social support, Transportation, Lacks knowledge of community resources to aide in caregiving for spouse with dementia in the home, and overall long term care planning needs . Acknowledges deficits with meeting this unmet need . Patient is unable to independently navigate community resource options without care coordination support Clinical Goals:  -patient will work with SW to address concerns related to caregiver strain/stress and resources/support available to aide in caring for spouse -patient will follow up with community  resources provided (via mail and other) as directed by SW Clinical Interventions:  CSW made initial outreach call to pt and spoke with her about the stress she is under as caregiver to her husband with dementia. Per pt, she and her husband of 26 years moved to [place] in October 2021. They have 3 meals provided daily in the dining hall, have activities and some transportation support. Per pt, her husband is fairly independent and able to with his ADL's but does require supervision; as well he "doesn't want me out of his sight" so she has little time alone.  CSW talked at length with pt about resources; their income  is too high for Medicaid. She does not want to pursue any sort of ALF/memory care placement for him at this time.  She does have some hired assistance for helping her to run errands as she is not driving due to vision issues post aneurysm.  CSW discussed possible increasing their private pay support to include some in home support for pt's spouse so pt can have some relief/breaks.   CSW also suggested conversations with extended family; his daughter is 82 and her daughter is her 55- she has given me permission to speak to both and provided me with their numbers-  CSW was able to make contact with pt's daughter in law, Letty Salvi (HCPOA to Mr Leifheit) who lives in [place]. She shared that they are looking into getting some additional in home caregiver support to help with needs for Mr Wilbert. She also shared that there are some local family members that live nearby who could help in an emergency; pt only mentioned out of town family.  CSW talked with daughter in law about potential resources that may help; including those discussed with pt as well as some websites/you-tube sites for support/educational/insight that may be helpful for dealing with dementia; behaviors/communication/etc.  Pt's daughter in law reassured CSW that the family is involved, working together well (with the blended family) and feels they are on a good path for planning and providing the care/needs for both.   CSW will plan to reach out to pt's daughter next week as she is on vacation per family.   . Collaboration with Tower, [name], MD regarding development and update of comprehensive plan of care as evidenced by provider attestation and co-signature . Inter-disciplinary care team collaboration (see longitudinal plan of care) . Assessment of needs, barriers , agencies contacted, as well as how impacting  . Review various resources, discussed options and provided patient information about Limited social support, Level of care  concerns, Social Isolation, Limited access to caregiver, and Leodis Liverpool knowledge of community resources . Collaborated with appropriate clinical care team members regarding patient needs Lacks knowledge of community resource:s .  Anxiety and caregiver stress . Patient interviewed and appropriate assessments performed . Referred patient to community resources care guide team for assistance with Adult Day Care, transportation, PACE and in-home assistance/caregiver support services . Provided patient with information about private duty care, long term care facility options, community program/services . Discussed plans with patient for ongoing care management follow up and provided patient with direct contact information for care management team . Advised patient to review resources, discuss with family plans for long term planning as well as for current and "in case of emergency"  . Collaborated with primary care provider  . Assisted patient/caregiver with obtaining information about health plan benefits- not eligible for Medicaid  . Provided education  to patient/caregiver regarding level of care options. . Other interventions provided: o Solution-Focused Strategies, Active listening / Reflection utilized , Emotional Supportive Provided, Problem Solving /Task Center , Motivational Interviewing, Caregiver stress acknowledged , Participation in support group encouraged , and Consideration of in-home help encouraged  Patient Goals:  -review Civil engineer, contracting (mailed to you) for consideration -discuss long term plans with family -consider hiring more in-home private pay care  - begin a notebook of services in my neighborhood or community - call 211 when I need some help - follow-up on any referrals for help I am given - think ahead to make sure my need does not become an emergency - make a note about what I need to have by the phone or take with me, like an identification card or social security  number have a back-up plan - have a back-up plan - make a list of family or friends that I can call  -  Follow Up Plan: Appointment scheduled for SW follow up with client by phone on: 05/03/2021

## 2021-04-15 ENCOUNTER — Telehealth: Payer: Self-pay

## 2021-04-15 NOTE — Telephone Encounter (Signed)
   Telephone encounter was:  Successful.  04/15/2021 Name: Julie [name] MRN: [id number] DOB: 1932/09/29  Julie [name] is a 87 y.o. year old female who is a primary care patient of Tower, [name], MD . The community resource team was consulted for assistance with [employer], transportation, adult day care  Care guide performed the following interventions: Patient provided with information about care guide support team and interviewed to confirm resource needs Spoke with patient she requested that I mail resources for [employer] of [employer] for [employer] and Attendance and transportation, Therapist, nutritional, Curator, [place].  Follow Up Plan:  Care guide will follow up with patient by phone over the next 7 days  Copeland Neisen, AAS Paralegal, [place] . Embedded Care Coordination [facility] Health  Care Management  [address], [place] [address] [email or web address]  ?? [phone]   [email or web address]

## 2021-04-17 ENCOUNTER — Other Ambulatory Visit: Payer: Self-pay

## 2021-04-17 ENCOUNTER — Ambulatory Visit (INDEPENDENT_AMBULATORY_CARE_PROVIDER_SITE_OTHER): Payer: Medicare Other | Admitting: Family Medicine

## 2021-04-17 ENCOUNTER — Encounter: Payer: Self-pay | Admitting: Family Medicine

## 2021-04-17 ENCOUNTER — Ambulatory Visit (INDEPENDENT_AMBULATORY_CARE_PROVIDER_SITE_OTHER)
Admission: RE | Admit: 2021-04-17 | Discharge: 2021-04-17 | Disposition: A | Discharge status: Home / Self Care | Payer: Medicare Other | Source: Ambulatory Visit | Attending: Family Medicine | Admitting: Family Medicine

## 2021-04-17 VITALS — BP 136/74 | HR 85 | Temp 96.7°F | Wt 178.0 lb

## 2021-04-17 DIAGNOSIS — M79601 Pain in right arm: Secondary | ICD-10-CM

## 2021-04-17 DIAGNOSIS — R5383 Other fatigue: Secondary | ICD-10-CM | POA: Insufficient documentation

## 2021-04-17 DIAGNOSIS — M25521 Pain in right elbow: Secondary | ICD-10-CM | POA: Diagnosis not present

## 2021-04-17 DIAGNOSIS — R5382 Chronic fatigue, unspecified: Secondary | ICD-10-CM | POA: Diagnosis not present

## 2021-04-17 DIAGNOSIS — R519 Headache, unspecified: Secondary | ICD-10-CM | POA: Diagnosis not present

## 2021-04-17 DIAGNOSIS — M79621 Pain in right upper arm: Secondary | ICD-10-CM | POA: Diagnosis not present

## 2021-04-17 DIAGNOSIS — M25511 Pain in right shoulder: Secondary | ICD-10-CM | POA: Diagnosis not present

## 2021-04-17 NOTE — Patient Instructions (Signed)
Xray of arm and shoulder today   Use some ice on areas that hurt for 10 minutes at a time   Follow up in 1-2 weeks

## 2021-04-17 NOTE — Progress Notes (Signed)
Subjective:    Patient ID: Julie [name], female    DOB: 1932-05-14, 87 y.o.   MRN: [id number]  This visit occurred during the SARS-CoV-2 public health emergency.  Safety protocols were in place, including screening questions prior to the visit, additional usage of staff PPE, and extensive cleaning of exam room while observing appropriate contact time as indicated for disinfecting solutions.    HPI Pt presents with c/o R shoulder and arm pain  Also fatigue and some headache (arrived late today and will discuss this at next visit)  Wt Readings from Last 3 Encounters:  04/17/21 178 lb (80.7 kg)  04/03/21 175 lb 2 oz (79.4 kg)  01/29/21 179 lb (81.2 kg)   32.56 kg/m  Shoulder radiating to arm  Woke up last week and had pain in right arm  Shoulder and area between shoulder and neck  Headache for about a week   Worst spot is mid upper arm  achey pain  Worse to raise her arm up   No known injury   Whole head hurts ? Sinus headache  Tried BC and advil-no help  Constant pain  Same all the time   Patient Active Problem List   Diagnosis Date Noted  . Right arm pain 04/17/2021  . Headache 04/17/2021  . Fatigue 04/17/2021  . Tail bone pain 04/03/2021  . Urge incontinence 01/22/2021  . Pedal edema 01/22/2021  . Thoracic back pain 10/23/2020  . Left elbow contusion 10/23/2020  . Mobility impaired 05/14/2020  . Spinal stenosis of lumbar region 02/08/2020  . Caregiver stress 02/08/2020  . Urinary frequency 02/08/2020  . Change of skin color 08/31/2018  . Encounter for screening mammogram for breast cancer 08/24/2017  . Parkinsonism ([place]) 08/24/2017  . Routine general medical examination at a health care facility 08/08/2016  . Left knee pain 02/01/2016  . Total knee replacement status 10/15/2015  . B12 deficiency 07/16/2015  . Peripheral neuropathy 07/16/2015  . 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 Phalanx fracture, foot 01/10/2015  . Colon cancer screening 09/22/2014  .  Osteoarthritis of left lower extremity 09/09/2014  . History of falling 04/26/2014  . Poor balance 04/26/2014  . Left-sided weakness 04/26/2014  . Encounter for Medicare annual wellness exam 07/13/2013  . Risk for falls 07/13/2013  . Skin tag of labia 03/14/2013  . Weakness of left leg 07/30/2011  . BACK PAIN, LUMBAR 12/10/2010  . Vitamin D deficiency 08/16/2010  . GERD 03/26/2010  . DYSPEPSIA 03/26/2010  . IRRITABLE BOWEL SYNDROME 03/26/2010  . Osteoporosis 08/27/2009  . Hypertriglyceridemia 07/24/2009  . DIVERTICULOSIS, COLON 07/24/2009  . OSTEOARTHRITIS 07/24/2009  . COLONIC POLYPS, HX OF 07/24/2009  . POSTMENOPAUSAL STATUS 07/24/2009   Past Medical History:  Diagnosis Date  . Adenoma   . Chronic leg pain   . Colon polyp   . DDD (degenerative disc disease), cervical   . Diverticulosis of colon    internal----Dr. Vira Agar  . Duodenitis   . Esophagitis   . GERD (gastroesophageal reflux disease)   . Hemorrhoids   . HLD (hyperlipidemia)   . IBS (irritable bowel syndrome)    with PP diarrhea  . Neuropathy   . Neuropathy   . OA (osteoarthritis) of knee   . Osteopenia   . PONV (postoperative nausea and vomiting)   . Scoliosis   . Seborrheic dermatitis   . Shortness of breath dyspnea    with activity  . TMJ syndrome   . Trochanteric bursitis   . Unsteady gait  FALLS EASILY   Past Surgical History:  Procedure Laterality Date  . CARPAL TUNNEL RELEASE Left 01/18/2013   Procedure: CARPAL TUNNEL RELEASE;  Surgeon: Wynonia Sours, MD;  Location: [facility];  Service: Orthopedics;  Laterality: Left;  OSTEOTOMY LEFT DISTAL RADIUS BONE CHIPS CARPAL TUNNEL RELEASE LEFT   . CATARACT EXTRACTION W/PHACO Right 08/07/2015   Procedure: CATARACT EXTRACTION PHACO AND INTRAOCULAR LENS PLACEMENT (IOC);  Surgeon: Birder Robson, MD;  Location: [facility];  Service: Ophthalmology;  Laterality: Right;  [place] 00:48.0 AP   22.3 CDE 10.69 casette lot # [id number]  . CATARACT  EXTRACTION W/PHACO Left 09/04/2015   Procedure: CATARACT EXTRACTION PHACO AND INTRAOCULAR LENS PLACEMENT (IOC);  Surgeon: Birder Robson, MD;  Location: [facility];  Service: Ophthalmology;  Laterality: Left;  [place] AP CDE FLUID LOT # [id number]  . CEREBRAL ANEURYSM REPAIR  2001   clamps  . COLONOSCOPY  1/11   polyps-hyperplastic and adenomatous  . FOOT SURGERY  2010   bilateral hammer toes and "knot"  . FRACTURE SURGERY Right 01/2014   result of a fall  . FRACTURE SURGERY Left 03/2014   result of a fall  . HEMORRHOID SURGERY    . KNEE ARTHROPLASTY Left 10/15/2015   Procedure: COMPUTER ASSISTED TOTAL KNEE ARTHROPLASTY;  Surgeon: Dereck Leep, MD;  Location: [facility];  Service: Orthopedics;  Laterality: Left;  . OPEN REDUCTION INTERNAL FIXATION (ORIF) DISTAL RADIAL FRACTURE Right 02/13/2015   Procedure: OPEN REDUCTION INTERNAL FIXATION (ORIF) RIGHT DISTAL RADIUS;  Surgeon: Daryll Brod, MD;  Location: [facility];  Service: Orthopedics;  Laterality: Right;  . OPEN REDUCTION INTERNAL FIXATION (ORIF) FINGER WITH RADIAL BONE GRAFT Left 11/01/2013   Procedure: OPEN REDUCTION INTERNAL FIXATION (ORIF) LEFT SMALL FINGER;  Surgeon: Wynonia Sours, MD;  Location: [facility];  Service: Orthopedics;  Laterality: Left;  . REPLACEMENT TOTAL KNEE Left   . TOTAL ABDOMINAL HYSTERECTOMY    . WRIST OSTEOTOMY Left 01/18/2013   Procedure: WRIST OSTEOTOMY;  Surgeon: Wynonia Sours, MD;  Location: [facility];  Service: Orthopedics;  Laterality: Left;   Social History   Tobacco Use  . Smoking status: Former Smoker    Quit date: 10/02/1954    Years since quitting: 66.5  . Smokeless tobacco: Never Used  Vaping Use  . Vaping Use: Never used  Substance Use Topics  . Alcohol use: No    Alcohol/week: 0.0 standard drinks  . Drug use: No   Family History  Problem Relation Age of Onset  . Colon cancer Mother   . Osteoporosis Other        hip fracture  . Stroke Brother   .  Colon cancer Brother   . Hypertension Brother   . Other Brother        Heart problem  . Colon cancer Brother   . Colon cancer Maternal Aunt   . Breast cancer Neg Hx    Allergies  Allergen Reactions  . Codeine     REACTION: headache and nausea and vomiting   Current Outpatient Medications on File Prior to Visit  Medication Sig Dispense Refill  . acetaminophen (TYLENOL) 500 MG tablet Take 500 mg by mouth every 6 (six) hours as needed.    . Alum Hydroxide-Mag Carbonate 160-105 MG CHEW Chew 1 tablet by mouth as needed (acid reflux).    . Cholecalciferol (SM VITAMIN D3) 100 MCG (4000 UT) CAPS Take 4,000 Units by mouth daily.    Marland Kitchen gabapentin (NEURONTIN) 300  MG capsule TAKE 1 CAPSULE BY MOUTH AT BEDTIME 90 capsule 3  . IBUPROFEN PO Take by mouth.    . loperamide (IMODIUM) 2 MG capsule Take by mouth as needed for diarrhea or loose stools.    . solifenacin (VESICARE) 10 MG tablet Take 1 tablet (10 mg total) by mouth daily. 30 tablet 1   No current facility-administered medications on file prior to visit.    Review of Systems  Constitutional: Positive for fatigue. Negative for activity change, appetite change, fever and unexpected weight change.  HENT: Negative for congestion, ear pain, rhinorrhea, sinus pressure and sore throat.   Eyes: Negative for pain, redness and visual disturbance.  Respiratory: Negative for cough, shortness of breath and wheezing.   Cardiovascular: Negative for chest pain and palpitations.  Gastrointestinal: Negative for abdominal pain, blood in stool, constipation and diarrhea.  Endocrine: Negative for polydipsia and polyuria.  Genitourinary: Negative for dysuria, frequency and urgency.  Musculoskeletal: Positive for arthralgias, back pain and gait problem. Negative for joint swelling, myalgias and neck pain.  Skin: Negative for pallor and rash.  Allergic/Immunologic: Negative for environmental allergies.  Neurological: Negative for dizziness, syncope and  headaches.  Hematological: Negative for adenopathy. Does not bruise/bleed easily.  Psychiatric/Behavioral: Positive for dysphoric mood. Negative for decreased concentration. The patient is not nervous/anxious.        Objective:   Physical Exam Constitutional:      General: She is not in acute distress.    Appearance: She is well-developed. She is obese. She is not ill-appearing, toxic-appearing or diaphoretic.  HENT:     Head: Normocephalic and atraumatic.  Eyes:     General: No scleral icterus.       Right eye: No discharge.        Left eye: No discharge.     Conjunctiva/sclera: Conjunctivae normal.     Pupils: Pupils are equal, round, and reactive to light.  Neck:     Comments: Slight tenderness over lower cervical musculature but not trapezius  Nl rom  Slight crepitus  Cardiovascular:     Rate and Rhythm: Normal rate and regular rhythm.  Pulmonary:     Effort: Pulmonary effort is normal. No respiratory distress.     Breath sounds: Normal breath sounds. No wheezing.  Musculoskeletal:     Right shoulder: Tenderness and bony tenderness present. No swelling, deformity, effusion or crepitus. Decreased range of motion. Normal strength. Normal pulse.     Right upper arm: Tenderness and bony tenderness present. No swelling, edema, deformity or lacerations.     Right elbow: No swelling. No tenderness.     Cervical back: Normal range of motion and neck supple.     Comments: R shoulder- mildly positive Hawkings exam  Limited abduction to 90 deg  Limited int rotation  Tender over acromion and also tender over mid humerus   Lymphadenopathy:     Cervical: No cervical adenopathy.  Skin:    Coloration: Skin is not pale.     Findings: No erythema or rash.  Neurological:     Mental Status: She is alert.     Comments: Poor balance  Cannot walk w/o walker and/or assist  Psychiatric:     Comments: Seems fatigued Pleasant   Helpful daughter is present today           Assessment  & Plan:   Problem List Items Addressed This Visit      Other   Right arm pain - Primary    Pain in  shoulder and upper arm worse with abduction of arm  Some radiation to trap area and some swelling of fingers this am (now improved) Nl rom of neck and some mid humerus tenderness on exam  Adv use of ice , elevate hand if helpful  Xray of shoulder and humerus today and plan to follow        Relevant Orders   DG Shoulder Right   DG Humerus Right   Headache    Episodic and frontal, mild and dull  Unsure if possibly cervicogenic  Unable to fully address today due to time but reassuring exam (no focal neuro def) Enc good fluid intake F/u in 1-2 wk to disc further  inst to call if worse before then        Fatigue    Did not have time to address today so f/u scheduled Last visit we discussed caregiver stress and we had social work reach out to see what resources are available and to see if mental health counseling would help No doubt this is adding to fatigue but may not be entire reason

## 2021-04-17 NOTE — Assessment & Plan Note (Signed)
Did not have time to address today so f/u scheduled Last visit we discussed caregiver stress and we had social work reach out to see what resources are available and to see if mental health counseling would help No doubt this is adding to fatigue but may not be entire reason

## 2021-04-17 NOTE — Assessment & Plan Note (Signed)
Pain in shoulder and upper arm worse with abduction of arm  Some radiation to trap area and some swelling of fingers this am (now improved) Nl rom of neck and some mid humerus tenderness on exam  Adv use of ice , elevate hand if helpful  Xray of shoulder and humerus today and plan to follow

## 2021-04-17 NOTE — Assessment & Plan Note (Signed)
Episodic and frontal, mild and dull  Unsure if possibly cervicogenic  Unable to fully address today due to time but reassuring exam (no focal neuro def) Enc good fluid intake F/u in 1-2 wk to disc further  inst to call if worse before then

## 2021-04-19 ENCOUNTER — Telehealth: Payer: Self-pay

## 2021-04-19 NOTE — Telephone Encounter (Signed)
Addressed through result notes  

## 2021-04-19 NOTE — Telephone Encounter (Signed)
Thanks for call, xray report did not return until just now  Please see result note , I want to get her into orthopedics

## 2021-04-19 NOTE — Telephone Encounter (Signed)
Received VM from Talbert Cage, caregiver, [phone], who reports pt's was seen two days ago and her condition is worsening.  Contacted Malachy Mood and advised could not give her any information because she was not on the DPR. She said that was fine and asked if this nurse would contact the pt concerning the x-rays. Advised this nurse will contact the pt.   Contacted pt who reports she can't grip anything with her hand in the morning but that is nothing new. She reports her hands are always worse in the morning but the pain has not worsened. She said Malachy Mood just worries about her. She reports no changes but would like to know what her x-rays results are. Advised a msg would be sent to PCP and this office would f/u. Advised if anything worsened to contact office. Pt verbalized understanding.

## 2021-04-21 ENCOUNTER — Telehealth: Payer: Self-pay | Admitting: Family Medicine

## 2021-04-21 DIAGNOSIS — M79601 Pain in right arm: Secondary | ICD-10-CM

## 2021-04-21 NOTE — Telephone Encounter (Signed)
-----   Message from Julie [name], [place] sent at 04/19/2021  2:13 PM EDT ----- Pt notified of xray results and Dr. Marliss Coots comments. Pt agrees with Ortho referral. Pt has seen Dr. Marry Guan at [facility] in [place] before so if possible would like to go back to him or at least  the same practice

## 2021-04-22 ENCOUNTER — Telehealth: Payer: Self-pay

## 2021-04-22 ENCOUNTER — Telehealth: Payer: Self-pay | Admitting: Family Medicine

## 2021-04-22 NOTE — Telephone Encounter (Signed)
   Telephone encounter was:  Unsuccessful.  04/22/2021 Name: Julie [name] MRN: [id number] DOB: Feb 22, 1932  Unsuccessful outbound call made today to assist with:  [facility] Aid and Attendance, transportation and caregiver resources  Outreach Attempt:  2nd Attempt  Unable to leave message line busy voicemail did not pick up. Called patient regarding mailed resources for [employer] and Attendance, transportation and caregiver resources.[name], AAS Paralegal, [place] . Embedded Care Coordination [facility]  Care Management  [address], [place] [address] ??[name]  ?? [phone]   [email or web address]

## 2021-04-22 NOTE — Telephone Encounter (Signed)
[facility] - Client TELEPHONE ADVICE RECORD [facility] Patient Name: Julie [name] Gender: Female DOB: 1932-10-21 Age: 87 Y 11 M 17 D Return Phone Number: [phone] (Primary) Address: City/ State/ Zip: [place]  [address] Client [facility] - Client Client Site [place] MD Contact Type Call Who Is Calling Patient / Member / Family / Caregiver Call Type Triage / Clinical Relationship To Patient Self Return Phone Number [phone] (Primary) Chief Complaint Arm Pain (known cause) Reason for Call Symptomatic / Request for [place] said she is having pain in both arms. Caller said she did have a fall a couple of weeks ago but when she did xrays she was told it was rotater cuff problems. Caller said she cant raise her arms and can`t sleep due to the pain. Caller wants to know if meds can be called in. Translation No Nurse Assessment Nurse: Clovis Riley, RN, Georgina Peer Date/Time (Eastern Time): 04/22/2021 10:21:45 AM Confirm and document reason for call. If symptomatic, describe symptoms. ---Caller said she is having pain in both arms. Caller said she did have a fall a couple of weeks ago but when she did xrays she was told it was rotator cuff and bone spurs problems. Caller said she cant raise her arms, States she is having some weakness and cant use her arms or hands. States can`t sleep due to the pain. she is taking advil and tylenol with no relief. Was seen in the office on wednesday. Does the patient have any new or worsening symptoms? ---Yes Will a triage be completed? ---Yes Related visit to physician within the last 2 weeks? ---Yes Does the PT have any chronic conditions? (i.e. diabetes, asthma, this includes High risk factors for pregnancy, etc.) ---No Is this a behavioral health or substance abuse call?  ---No Guidelines Guideline Title Affirmed Question Affirmed Notes Nurse Date/Time (Eastern Time) Arm Pain [1] SEVERE pain AND [2] not improved 2 hours after pain medicine Clovis Riley, RN, Georgina Peer 04/22/2021 10:24:43 AM PLEASE NOTE: All timestamps contained within this report are represented as [place] Standard Time. CONFIDENTIALTY NOTICE: This fax transmission is intended only for the addressee. It contains information that is legally privileged, confidential or otherwise protected from use or disclosure. If you are not the intended recipient, you are strictly prohibited from reviewing, disclosing, copying using or disseminating any of this information or taking any action in reliance on or regarding this information. If you have received this fax in error, please notify us immediately by telephone so that we can arrange for its return to [place]. Phone: [phone], Toll-Free: [phone], Fax: [phone] Page: 2 of 2 Call Id: [id number] [place]. Time Eilene Ghazi Time) Disposition Final User 04/22/2021 10:25:44 AM Go to ED Now (or PCP triage) Yes Clovis Riley, RN, Leilani Merl Disagree/Comply Disagree Caller Understands Yes PreDisposition Call Doctor Care Advice Given Per Guideline GO TO ED NOW (OR PCP TRIAGE): * IF NO PCP (PRIMARY CARE PROVIDER) SECOND-LEVEL TRIAGE: You need to be seen within the next hour. Go to the [facility] at _____________ [place] as soon as you can. CARE ADVICE given per Arm Pain (Adult) guideline. Referrals GO TO FACILITY REFUSED

## 2021-04-22 NOTE — Telephone Encounter (Signed)
Called twice and the line was busy both times 

## 2021-04-22 NOTE — Telephone Encounter (Signed)
I pended a px for prednisone to see if this helps (do not take with nsaids)  Take with food Let her know it can cause trouble sleeping/inc appetite and jitteriness  Send in if she is ok with this  Enc her to try and reach out to family for help with transportation if needed  I hate the fact that she cannot get out with care of her husband, but totally understand

## 2021-04-22 NOTE — Telephone Encounter (Signed)
Can the referral for ortho be changed to urgent (for arm pain that has become more severe) , or do you need a new one?  Thanks

## 2021-04-22 NOTE — Telephone Encounter (Signed)
Call transferred to clinic due to Access Nurse recommending pt go to ER or UC. Pt said she does not want to go because she doesn't want to wait and she doesn't have transportation.  Contacted pt, pt c/o R shoulder/arm pain down to fingers pain and L upper arm, down to fingers pain, rated on a scale of 0-10 as a 9. Pt reports she is taking 2 tablets of advil in the AM and 2 in the PM. She is not aware of the dosage.Pt used to take tylenol but stopped because she reports it no longer helps her.  Pt has not tried any other  Therapies suggested, such as elevation and ice. She reports she is waiting on an arm sling. Pt wanted to know how long it would take for referral. Advised for her to also contact ortho since she has seen this office before. Gave pt phone number for [facility], [phone]. Advised pt to start using the other suggested therapies and to check directions on bottle of advil to see how she should be taking. Advised a msg would be sent to PCP to request further recommendations for pain. Advised if pain worsened to contact office. Provided ER precautions. Pt verbalized understanding and was very appreciative.

## 2021-04-22 NOTE — Telephone Encounter (Signed)
I am sending her to access nurse

## 2021-04-22 NOTE — Addendum Note (Signed)
Addended by: [name] A on: 04/22/2021 02:42 PM   Modules accepted: Orders

## 2021-04-22 NOTE — Telephone Encounter (Signed)
Spoke with [facility] office in [place] and they stated patient called and scheduled for June 8th. [place] location can get patient in sooner.

## 2021-04-22 NOTE — Telephone Encounter (Signed)
Julie [name] called in and stated that she is in need of something for pain in her arms. She stated that she was referred to an orthopedic doctor and havent heard anything. And she is having pain in the left and right arm. Wanted to know about what to do.

## 2021-04-22 NOTE — Telephone Encounter (Signed)
I advised patient. Patient is not able to go to [place] due to transportation. Discussed also going to [facility] office for urgent care services. Patient has to have care giver for her husband to stay with him and has limited availability on who and when they can come. She may be able to go this weekend or Friday to the urgent care.  Patient is asking if a pain medication can be called in to help her?

## 2021-04-23 MED ORDER — PREDNISONE 10 MG PO TABS: ORAL_TABLET | ORAL | 0 refills | Status: DC

## 2021-04-23 NOTE — Telephone Encounter (Signed)
Pt notified of Dr. Marliss Coots instructions and comments and Rx was sent to pharmacy

## 2021-04-23 NOTE — Addendum Note (Signed)
Addended by: Tammi Sou on: 04/23/2021 08:23 AM   Modules accepted: Orders

## 2021-04-23 NOTE — Telephone Encounter (Signed)
[facility] - Client TELEPHONE ADVICE RECORD [facility] Patient Name: Julie [name] Gender: Female DOB: [birth year] Age: 87 Y 33 M 18 D Return Phone Number: [phone] (Primary) Address: City/ State/ Zip: [place] [address] Client [facility] - Client Client Site [facility] Physician [facility], Roque Lias - MD Contact Type Call Who Is Calling Patient / Member / Family / Caregiver Call Type Triage / Clinical Relationship To Patient Self Return Phone Number [phone] (Primary) Chief Complaint Arm Pain (known cause) Reason for Call Symptomatic / Request for [place] states she was suppose to have a prescription called in today for arm pain but it has not been received. Translation No Nurse Assessment Nurse: Clovis Riley, RN, Georgina Peer Date/Time (Eastern Time): 04/23/2021 7:14:56 AM Please select the assessment type ---RX called in but not at pharm Additional Documentation ---Caller states she was suppose to have a prescription called in yesterday for arm pain but it has not been received. Document the name of the medication. ---unknown Has the office closed within the last 30 minutes? ---No Does the client directives allow for assistance with medications after hours? ---No Disp. Time Eilene Ghazi Time) Disposition Final User 04/22/2021 6:10:22 PM Attempt made - no message left Sherren Mocha 04/22/2021 6:29:37 PM Send to RN Final Attempt Hillard Danker, RN, Brandi 04/23/2021 6:50:55 AM Send To Clinical Follow Up Jethro Poling, RN, [facility] 04/23/2021 6:55:43 AM Attempt made - message left Clovis Riley, RN, Georgina Peer 04/22/2021 6:29:27 PM FINAL ATTEMPT MADE - no message left

## 2021-04-24 ENCOUNTER — Ambulatory Visit: Payer: Medicare Other | Admitting: Family Medicine

## 2021-04-26 ENCOUNTER — Ambulatory Visit: Payer: Medicare Other

## 2021-04-26 ENCOUNTER — Telehealth: Payer: Self-pay

## 2021-04-26 NOTE — Telephone Encounter (Signed)
   Telephone encounter was:  Successful.  04/26/2021 Name: Julie [name] MRN: [id number] DOB: 1932/07/25  Julie [name] is a 87 y.o. year old female who is a primary care patient of [facility], [name], MD . The community resource team was consulted for assistance with Transportation Needs , Caregiver Stress and Veterans Aid and Attendance  Care guide performed the following interventions: Follow up call placed to the patient to discuss status of referral Spoke with patient to confirm if she has received resource letter mailed on 5/16 for [employer] Aid and Attendance, [employer], Museum/gallery curator and [employer]. Patient stated she has not checked her mail box but would check it today. .  Follow Up Plan:  Care guide will follow up with patient by phone over the next 10 days  Rocquel Askren, AAS Paralegal, [place] . Embedded Care Coordination [facility]  Care Management  [address], [place] [address] ??[email or web address]  ?? [phone]   [email or web address]

## 2021-05-01 ENCOUNTER — Ambulatory Visit: Payer: Medicare Other | Admitting: Family Medicine

## 2021-05-03 ENCOUNTER — Telehealth: Payer: Medicare Other

## 2021-05-06 ENCOUNTER — Telehealth: Payer: Self-pay

## 2021-05-06 NOTE — Telephone Encounter (Signed)
   Telephone encounter was:  Successful.  05/06/2021 Name: [name] MRN: [id number] DOB: 19-Dec-1931  Julie [name] is a 87 y.o. year old female who is a primary care patient of Tower, [name], MD . The community resource team was consulted for assistance with Veteran's Aid and Attendance and transportation, [employer], [place], [place] guide performed the following interventions: Follow up call placed to the patient to discuss status of referral Spoke with patient she stated she has not received patient resource letter yet.  Let patient know I will have letter mailed again and the envelope would have [place]. Patient stated that she would call me when she receives it.   Sent documents to [place] to be mailed to patient..  Follow Up Plan:  No further follow up planned at this time. The patient has been provided with needed resources.  Imogean Ciampa, AAS Paralegal, [place] . Embedded Care Coordination [facility]  [facility]  [address], [place] [address] ??[email or web address]  ?? [phone]   [email or web address]

## 2021-05-08 ENCOUNTER — Telehealth: Payer: Self-pay

## 2021-05-08 DIAGNOSIS — G8929 Other chronic pain: Secondary | ICD-10-CM | POA: Diagnosis not present

## 2021-05-08 DIAGNOSIS — M25512 Pain in left shoulder: Secondary | ICD-10-CM | POA: Diagnosis not present

## 2021-05-08 DIAGNOSIS — M7582 Other shoulder lesions, left shoulder: Secondary | ICD-10-CM | POA: Diagnosis not present

## 2021-05-08 DIAGNOSIS — M7581 Other shoulder lesions, right shoulder: Secondary | ICD-10-CM | POA: Diagnosis not present

## 2021-05-08 DIAGNOSIS — M25511 Pain in right shoulder: Secondary | ICD-10-CM | POA: Diagnosis not present

## 2021-05-08 NOTE — Telephone Encounter (Signed)
   Telephone encounter was:  Successful.  05/08/2021 Name: KEIGHLEY DECKMAN MRN: [id number] DOB: 02/13/1932  Judi Saa Marut is a 87 y.o. year old female who is a primary care patient of Tower, [name], MD . The community resource team was consulted for assistance with Transportation, Veteran's Aid and Attendance, [employer],  [employer] information  Care guide performed the following interventions: Spoke with Maudie Mercury at [facility] Dr. Mikle Bosworth gave permission for me to email patient resource information to him to place on the patient's chart today.  Patient has appointment at 1:30pm.  Follow Up Plan:  No further follow up planned at this time. The patient has been provided with needed resources.  Delno Blaisdell, AAS Paralegal, [place] . Embedded Care Coordination [facility]  [facility]  [address], [place] [address] ??[email or web address]  ?? [phone]   [email or web address]

## 2021-05-24 ENCOUNTER — Ambulatory Visit (INDEPENDENT_AMBULATORY_CARE_PROVIDER_SITE_OTHER): Payer: Medicare Other

## 2021-05-24 ENCOUNTER — Other Ambulatory Visit: Payer: Self-pay

## 2021-05-24 DIAGNOSIS — Z0000 Encounter for general adult medical examination without abnormal findings: Secondary | ICD-10-CM | POA: Diagnosis not present

## 2021-05-24 NOTE — Progress Notes (Signed)
PCP notes:  Health Maintenance: Shingrix- due   Abnormal Screenings: none   Patient concerns: Urinary incontinence   Nurse concerns: none   Next PCP appt.: none

## 2021-05-24 NOTE — Patient Instructions (Signed)
Julie [name] , Thank you for taking time to come for your Medicare Wellness Visit. I appreciate your ongoing commitment to your health goals. Please review the following plan we discussed and let me know if I can assist you in the future.   Screening recommendations/referrals: Colonoscopy: no longer required  Mammogram: no longer required  Bone Density: declined Recommended yearly ophthalmology/optometry visit for glaucoma screening and checkup Recommended yearly dental visit for hygiene and checkup  Vaccinations: Influenza vaccine: Up to date, completed 09/14/2020, due 07/2021 Pneumococcal vaccine: Completed series Tdap vaccine: Up to date, completed 03/26/2015, due 03/2025 Shingles vaccine: due, check with your insurance regarding coverage if interested    Covid-19:completed 3 vaccines, second booster scheduled 05/30/2021  Advanced directives: copy in chart  Conditions/risks identified: none  Next appointment: Follow up in one year for your annual wellness visit    Preventive Care 15 Years and Older, Female Preventive care refers to lifestyle choices and visits with your health care provider that can promote health and wellness. What does preventive care include? A yearly physical exam. This is also called an annual well check. Dental exams once or twice a year. Routine eye exams. Ask your health care provider how often you should have your eyes checked. Personal lifestyle choices, including: Daily care of your teeth and gums. Regular physical activity. Eating a healthy diet. Avoiding tobacco and drug use. Limiting alcohol use. Practicing safe sex. Taking low-dose aspirin every day. Taking vitamin and mineral supplements as recommended by your health care provider. What happens during an annual well check? The services and screenings done by your health care provider during your annual well check will depend on your age, overall health, lifestyle risk factors, and family history of  disease. Counseling  Your health care provider may ask you questions about your: Alcohol use. Tobacco use. Drug use. Emotional well-being. Home and relationship well-being. Sexual activity. Eating habits. History of falls. Memory and ability to understand (cognition). Work and work Statistician. Reproductive health. Screening  You may have the following tests or measurements: Height, weight, and BMI. Blood pressure. Lipid and cholesterol levels. These may be checked every 5 years, or more frequently if you are over 70 years old. Skin check. Lung cancer screening. You may have this screening every year starting at age 65 if you have a 30-pack-year history of smoking and currently smoke or have quit within the past 15 years. Fecal occult blood test (FOBT) of the stool. You may have this test every year starting at age 24. Flexible sigmoidoscopy or colonoscopy. You may have a sigmoidoscopy every 5 years or a colonoscopy every 10 years starting at age 51. Hepatitis C blood test. Hepatitis B blood test. Sexually transmitted disease (STD) testing. Diabetes screening. This is done by checking your blood sugar (glucose) after you have not eaten for a while (fasting). You may have this done every 1-3 years. Bone density scan. This is done to screen for osteoporosis. You may have this done starting at age 51. Mammogram. This may be done every 1-2 years. Talk to your health care provider about how often you should have regular mammograms. Talk with your health care provider about your test results, treatment options, and if necessary, the need for more tests. Vaccines  Your health care provider may recommend certain vaccines, such as: Influenza vaccine. This is recommended every year. Tetanus, diphtheria, and acellular pertussis (Tdap, Td) vaccine. You may need a Td booster every 10 years. Zoster vaccine. You may need this after age  60. Pneumococcal 13-valent conjugate (PCV13) vaccine. One  dose is recommended after age 12. Pneumococcal polysaccharide (PPSV23) vaccine. One dose is recommended after age 80. Talk to your health care provider about which screenings and vaccines you need and how often you need them. This information is not intended to replace advice given to you by your health care provider. Make sure you discuss any questions you have with your health care provider. Document Released: 12/14/2015 Document Revised: 08/06/2016 Document Reviewed: 09/18/2015 Elsevier Interactive Patient Education  2017 [employer] Prevention in the Home Falls can cause injuries. They can happen to people of all ages. There are many things you can do to make your home safe and to help prevent falls. What can I do on the outside of my home? Regularly fix the edges of walkways and driveways and fix any cracks. Remove anything that might make you trip as you walk through a door, such as a raised step or threshold. Trim any bushes or trees on the path to your home. Use bright outdoor lighting. Clear any walking paths of anything that might make someone trip, such as rocks or tools. Regularly check to see if handrails are loose or broken. Make sure that both sides of any steps have handrails. Any raised decks and porches should have guardrails on the edges. Have any leaves, snow, or ice cleared regularly. Use sand or salt on walking paths during winter. Clean up any spills in your garage right away. This includes oil or grease spills. What can I do in the bathroom? Use night lights. Install grab bars by the toilet and in the tub and shower. Do not use towel bars as grab bars. Use non-skid mats or decals in the tub or shower. If you need to sit down in the shower, use a plastic, non-slip stool. Keep the floor dry. Clean up any water that spills on the floor as soon as it happens. Remove soap buildup in the tub or shower regularly. Attach bath mats securely with double-sided  non-slip rug tape. Do not have throw rugs and other things on the floor that can make you trip. What can I do in the bedroom? Use night lights. Make sure that you have a light by your bed that is easy to reach. Do not use any sheets or blankets that are too big for your bed. They should not hang down onto the floor. Have a firm chair that has side arms. You can use this for support while you get dressed. Do not have throw rugs and other things on the floor that can make you trip. What can I do in the kitchen? Clean up any spills right away. Avoid walking on wet floors. Keep items that you use a lot in easy-to-reach places. If you need to reach something above you, use a strong step stool that has a grab bar. Keep electrical cords out of the way. Do not use floor polish or wax that makes floors slippery. If you must use wax, use non-skid floor wax. Do not have throw rugs and other things on the floor that can make you trip. What can I do with my stairs? Do not leave any items on the stairs. Make sure that there are handrails on both sides of the stairs and use them. Fix handrails that are broken or loose. Make sure that handrails are as long as the stairways. Check any carpeting to make sure that it is firmly attached to the stairs. Fix  any carpet that is loose or worn. Avoid having throw rugs at the top or bottom of the stairs. If you do have throw rugs, attach them to the floor with carpet tape. Make sure that you have a light switch at the top of the stairs and the bottom of the stairs. If you do not have them, ask someone to add them for you. What else can I do to help prevent falls? Wear shoes that: Do not have high heels. Have rubber bottoms. Are comfortable and fit you well. Are closed at the toe. Do not wear sandals. If you use a stepladder: Make sure that it is fully opened. Do not climb a closed stepladder. Make sure that both sides of the stepladder are locked into place. Ask  someone to hold it for you, if possible. Clearly mark and make sure that you can see: Any grab bars or handrails. First and last steps. Where the edge of each step is. Use tools that help you move around (mobility aids) if they are needed. These include: Canes. Walkers. Scooters. Crutches. Turn on the lights when you go into a dark area. Replace any light bulbs as soon as they burn out. Set up your furniture so you have a clear path. Avoid moving your furniture around. If any of your floors are uneven, fix them. If there are any pets around you, be aware of where they are. Review your medicines with your doctor. Some medicines can make you feel dizzy. This can increase your chance of falling. Ask your doctor what other things that you can do to help prevent falls. This information is not intended to replace advice given to you by your health care provider. Make sure you discuss any questions you have with your health care provider. Document Released: 09/13/2009 Document Revised: 04/24/2016 Document Reviewed: 12/22/2014 Elsevier Interactive Patient Education  2017 [employer].

## 2021-05-24 NOTE — Progress Notes (Signed)
Subjective:   Julie [name] is a 87 y.o. female who presents for Medicare Annual (Subsequent) preventive examination.  Review of Systems: N/A      I connected with the patient today by telephone and verified that I am speaking with the correct person using two identifiers. Location patient: home Location nurse: work Persons participating in the telephone visit: patient, nurse.   I discussed the limitations, risks, security and privacy concerns of performing an evaluation and management service by telephone and the availability of in person appointments. I also discussed with the patient that there may be a patient responsible charge related to this service. The patient expressed understanding and verbally consented to this telephonic visit.        Cardiac Risk Factors include: advanced age (>44men, >65 women)     Objective:    Today's Vitals   There is no height or weight on file to calculate BMI.  Advanced Directives 05/24/2021 01/29/2021 10/09/2020 04/11/2020 08/24/2017 02/25/2017 07/25/2016  Does Patient Have a Medical Advance Directive? Yes No No Yes Yes Yes Yes  Type of Paramedic of [place];Living will - - [place];Living will [place];Living will - [place];Living will  Does patient want to make changes to medical advance directive? - - - - No - Patient declined - No - Patient declined  Copy of [place] in Chart? Yes - validated most recent copy scanned in chart (See row information) - - Yes - validated most recent copy scanned in chart (See row information) No - copy requested - No - copy requested  Would patient like information on creating a medical advance directive? - No - Patient declined - - - - -  Pre-existing out of facility DNR order (yellow form or pink MOST form) - - - - - - -    Current Medications (verified) Outpatient Encounter Medications as of 05/24/2021   Medication Sig   acetaminophen (TYLENOL) 500 MG tablet Take 500 mg by mouth every 6 (six) hours as needed.   Alum Hydroxide-Mag Carbonate 160-105 MG CHEW Chew 1 tablet by mouth as needed (acid reflux).   Cholecalciferol (SM VITAMIN D3) 100 MCG (4000 UT) CAPS Take 4,000 Units by mouth daily.   gabapentin (NEURONTIN) 300 MG capsule TAKE 1 CAPSULE BY MOUTH AT BEDTIME   IBUPROFEN PO Take by mouth.   loperamide (IMODIUM) 2 MG capsule Take by mouth as needed for diarrhea or loose stools.   solifenacin (VESICARE) 10 MG tablet Take 1 tablet (10 mg total) by mouth daily.   predniSONE (DELTASONE) 10 MG tablet Take 3 pills once daily by mouth for 3 days, then 2 pills once daily for 3 days, then 1 pill once daily for 3 days and then stop (Patient not taking: Reported on 05/24/2021)   No facility-administered encounter medications on file as of 05/24/2021.    Allergies (verified) Codeine   History: Past Medical History:  Diagnosis Date   Adenoma    Chronic leg pain    Colon polyp    DDD (degenerative disc disease), cervical    Diverticulosis of colon    internal----Dr. Vira Agar   Duodenitis    Esophagitis    GERD (gastroesophageal reflux disease)    Hemorrhoids    HLD (hyperlipidemia)    IBS (irritable bowel syndrome)    with PP diarrhea   Neuropathy    Neuropathy    OA (osteoarthritis) of knee    Osteopenia  PONV (postoperative nausea and vomiting)    Scoliosis    Seborrheic dermatitis    Shortness of breath dyspnea    with activity   TMJ syndrome    Trochanteric bursitis    Unsteady gait    FALLS EASILY   Past Surgical History:  Procedure Laterality Date   CARPAL TUNNEL RELEASE Left 01/18/2013   Procedure: CARPAL TUNNEL RELEASE;  Surgeon: Wynonia Sours, MD;  Location: [facility];  Service: Orthopedics;  Laterality: Left;  OSTEOTOMY LEFT DISTAL RADIUS BONE CHIPS CARPAL TUNNEL RELEASE LEFT    CATARACT EXTRACTION W/PHACO Right 08/07/2015   Procedure: CATARACT  EXTRACTION PHACO AND INTRAOCULAR LENS PLACEMENT ([place]);  Surgeon: Birder Robson, MD;  Location: [facility];  Service: Ophthalmology;  Laterality: Right;  [place] 00:48.0 AP   22.3 CDE 10.69 casette lot # [id number]   CATARACT EXTRACTION W/PHACO Left 09/04/2015   Procedure: CATARACT EXTRACTION PHACO AND INTRAOCULAR LENS PLACEMENT ([facility]);  Surgeon: Birder Robson, MD;  Location: [facility];  Service: Ophthalmology;  Laterality: Left;  [place] AP CDE FLUID LOT # [id number]   CEREBRAL ANEURYSM REPAIR  2001   clamps   COLONOSCOPY  1/11   polyps-hyperplastic and adenomatous   FOOT SURGERY  2010   bilateral hammer toes and "knot"   FRACTURE SURGERY Right 01/2014   result of a fall   FRACTURE SURGERY Left 03/2014   result of a fall   HEMORRHOID SURGERY     KNEE ARTHROPLASTY Left 10/15/2015   Procedure: COMPUTER ASSISTED TOTAL KNEE ARTHROPLASTY;  Surgeon: Dereck Leep, MD;  Location: [facility];  Service: Orthopedics;  Laterality: Left;   OPEN REDUCTION INTERNAL FIXATION (ORIF) DISTAL RADIAL FRACTURE Right 02/13/2015   Procedure: OPEN REDUCTION INTERNAL FIXATION (ORIF) RIGHT DISTAL RADIUS;  Surgeon: Daryll Brod, MD;  Location: [facility];  Service: Orthopedics;  Laterality: Right;   OPEN REDUCTION INTERNAL FIXATION ([facility]) FINGER WITH RADIAL BONE GRAFT Left 11/01/2013   Procedure: OPEN REDUCTION INTERNAL FIXATION ([facility]) LEFT SMALL FINGER;  Surgeon: Wynonia Sours, MD;  Location: [facility];  Service: Orthopedics;  Laterality: Left;   REPLACEMENT TOTAL KNEE Left    TOTAL ABDOMINAL HYSTERECTOMY     WRIST OSTEOTOMY Left 01/18/2013   Procedure: WRIST OSTEOTOMY;  Surgeon: Wynonia Sours, MD;  Location: [facility];  Service: Orthopedics;  Laterality: Left;   Family History  Problem Relation Age of Onset   Colon cancer Mother    Osteoporosis Other        hip fracture   Stroke Brother    Colon cancer Brother    Hypertension Brother    Other Brother        Heart problem    Colon cancer Brother    Colon cancer Maternal Aunt    Breast cancer Neg Hx    Social History   Socioeconomic History   Marital status: Married    Spouse name: Not on file   Number of children: Not on file   Years of education: Not on file   Highest education level: Not on file  Occupational History   Occupation: Retired    Comment: bookkeeper  Tobacco Use   Smoking status: Former    Pack years: 0.00    Types: Cigarettes    Quit date: 10/02/1954    Years since quitting: 66.6   Smokeless tobacco: Never  Vaping Use   Vaping Use: Never used  Substance and Sexual Activity   Alcohol use: No    Alcohol/week:  0.0 standard drinks   Drug use: No   Sexual activity: Never    Comment: smoked alittle as teen  Other Topics Concern   Not on file  Social History Narrative   RetiredMarriedRegular exercise   Husband has dementia.   Social Determinants of Health   Financial Resource Strain: Low Risk    Difficulty of Paying Living Expenses: Not hard at all  Food Insecurity: No Food Insecurity   Worried About Charity fundraiser in the Last Year: Never true   [place] in the Last Year: Never true  Transportation Needs: No Transportation Needs   Lack of Transportation (Medical): No   Lack of Transportation (Non-Medical): No  Physical Activity: Inactive   Days of Exercise per Week: 0 days   Minutes of Exercise per Session: 0 min  Stress: No Stress Concern Present   Feeling of Stress : Not at all  Social Connections: Not on file    Tobacco Counseling Counseling given: Not Answered   Clinical Intake:  Pre-visit preparation completed: Yes  Pain : 0-10 Pain Type: Chronic pain Pain Location: Shoulder Pain Orientation: Left, Right Pain Descriptors / Indicators: Sore Pain Onset: More than a month ago Pain Frequency: Intermittent     Nutritional Risks: None Diabetes: No  How often do you need to have someone help you when you read instructions, pamphlets, or other  written materials from your doctor or pharmacy?: 1 - Never  Diabetic: No Nutrition Risk Assessment:  Has the patient had any N/V/D within the last 2 months?  No  Does the patient have any non-healing wounds?  No  Has the patient had any unintentional weight loss or weight gain?  No   Diabetes:  Is the patient diabetic?  No  If diabetic, was a CBG obtained today?   N/A Did the patient bring in their glucometer from home?   N/A How often do you monitor your CBG's? N/A.   Financial Strains and Diabetes Management:  Are you having any financial strains with the device, your supplies or your medication?  N/A .  Does the patient want to be seen by Chronic Care Management for management of their diabetes?   N/A Would the patient like to be referred to a Nutritionist or for Diabetic Management?   N/A    Interpreter Needed?: No  Information entered by :: CJohnson, LPN   Activities of Daily Living In your present state of health, do you have any difficulty performing the following activities: 05/24/2021 04/12/2021  Hearing? N N  Vision? N N  Difficulty concentrating or making decisions? N N  Walking or climbing stairs? N N  Dressing or bathing? N N  Doing errands, shopping? N N  Preparing Food and eating ? N N  Using the Toilet? N N  In the past six months, have you accidently leaked urine? Y N  Comment wears depends -  Do you have problems with loss of bowel control? N N  Managing your Medications? N N  Managing your Finances? N N  Housekeeping or managing your Housekeeping? Y N  Some recent data might be hidden    Patient Care Team: Tower, [name], MD as PCP - General Hooten, Laurice Record, MD as Consulting Physician (Orthopedic Surgery) Deirdre Peer, LCSW as Social Worker (Licensed Clinical Social Worker)  Indicate any recent [employer] you may have received from other than [facility] providers in the past year (date may be approximate).     Assessment:  This is a  routine wellness examination for [place].  Hearing/Vision screen Vision Screening - Comments:: Patient gets annual eye exams.  Dietary issues and exercise activities discussed: Current Exercise Habits: The patient does not participate in regular exercise at present, Exercise limited by: None identified   Goals Addressed             This Visit's Progress    Patient Stated       05/24/2021, I will maintain and continue medications as prescribed.         Depression Screen PHQ 2/9 Scores 05/24/2021 04/11/2020 02/08/2020 08/24/2017 07/25/2016 09/22/2014 07/14/2013  PHQ - 2 Score 1 6 2  0 1 0 0  PHQ- 9 Score 2 6 6  - - - -    Fall Risk Fall Risk  05/24/2021 04/11/2020 08/24/2017 05/27/2017 09/16/2016  Falls in the past year? 1 0 No No -  Number falls in past yr: 1 0 - - 2 or more  Injury with Fall? 1 0 - - Yes  Risk Factor Category  - - - - High Fall Risk  Risk for fall due to : History of fall(s);Impaired balance/gait Medication side effect;Impaired balance/gait - - -  Follow up Falls evaluation completed;Falls prevention discussed Falls evaluation completed;Falls prevention discussed - - Falls evaluation completed    FALL RISK PREVENTION PERTAINING TO THE HOME:  Any stairs in or around the home? Yes  If so, are there any without handrails? No  Home free of loose throw rugs in walkways, pet beds, electrical cords, etc? Yes  Adequate lighting in your home to reduce risk of falls? Yes   ASSISTIVE DEVICES UTILIZED TO PREVENT FALLS:  Life alert? No  Use of a cane, walker or w/c? Yes  Grab bars in the bathroom? Yes  Shower chair or bench in shower? No  Elevated toilet seat or a handicapped toilet? Yes   TIMED UP AND GO:  Was the test performed?  N/A telephone visit .    Cognitive Function: MMSE - Mini Mental State Exam 05/24/2021 04/11/2020 07/25/2016  Orientation to time 5 5 5   Orientation to Place 5 5 5   Registration 3 3 3   Attention/ Calculation 5 5 0  Recall 3 3 3    Language- name 2 objects - - 0  Language- repeat 1 1 1   Language- follow 3 step command - - 3  Language- read & follow direction - - 0  Write a sentence - - 0  Copy design - - 0  Total score - - 20  Mini Cog  Mini-Cog screen was completed. Maximum score is 22. A value of 0 denotes this part of the MMSE was not completed or the patient failed this part of the Mini-Cog screening.       Immunizations Immunization History  Administered Date(s) Administered   Influenza, High Dose Seasonal PF 11/17/2019   Influenza,inj,Quad PF,6+ Mos 08/08/2016, 08/24/2017, 08/31/2018   Influenza-Unspecified 09/08/2014, 09/15/2015, 09/14/2020   PFIZER(Purple Top)SARS-COV-2 Vaccination 01/06/2020, 01/27/2020, 10/11/2020   Pneumococcal Conjugate-13 09/22/2014   Pneumococcal Polysaccharide-23 07/13/2013   Td 05/01/2005, 03/26/2015   Zoster, Live 08/31/2013    TDAP status: Up to date  Flu Vaccine status: Up to date  Pneumococcal vaccine status: Up to date  Covid-19 vaccine status: Completed 3 vaccines. Second booster scheduled 05/03/2021 per patient   Qualifies for Shingles Vaccine? Yes   Zostavax completed Yes   Shingrix Completed?: No.    Education has been provided regarding the importance of this vaccine. Patient has been  advised to call insurance company to determine out of pocket expense if they have not yet received this vaccine. Advised may also receive vaccine at local pharmacy or Health Dept. Verbalized acceptance and understanding.  Screening Tests Health Maintenance  Topic Date Due   Zoster Vaccines- Shingrix (1 of 2) Never done   COVID-19 Vaccine (4 - Booster for Pfizer series) 01/11/2021   INFLUENZA VACCINE  07/01/2021   TETANUS/TDAP  03/25/2025   DEXA SCAN  Completed   PNA vac Low Risk Adult  Completed   HPV VACCINES  Aged Out    Health Maintenance  Health Maintenance Due  Topic Date Due   Zoster Vaccines- Shingrix (1 of 2) Never done   COVID-19 Vaccine (4 - Booster for  Pfizer series) 01/11/2021    Colorectal cancer screening: No longer required.   Mammogram status: No longer required due to age.  Bone Density status: declined  Lung Cancer Screening: (Low Dose CT Chest recommended if Age 23-80 years, 30 pack-year currently smoking OR have quit w/in 15years.) does not qualify.    Additional Screening:  Hepatitis C Screening: does not qualify; Completed N/A  Vision Screening: Recommended annual ophthalmology exams for early detection of glaucoma and other disorders of the eye. Is the patient up to date with their annual eye exam?  Yes  Who is the provider or what is the name of the office in which the patient attends annual eye exams? [facility] If pt is not established with a provider, would they like to be referred to a provider to establish care? No .   Dental Screening: Recommended annual dental exams for proper oral hygiene  Community Resource Referral / Chronic Care Management: CRR required this visit?  No   CCM required this visit?  No      Plan:     I have personally reviewed and noted the following in the patient's chart:   Medical and social history Use of alcohol, tobacco or illicit drugs  Current medications and supplements including opioid prescriptions.  Functional ability and status Nutritional status Physical activity Advanced directives List of other physicians Hospitalizations, surgeries, and ER visits in previous 12 months Vitals Screenings to include cognitive, depression, and falls Referrals and appointments  In addition, I have reviewed and discussed with patient certain preventive protocols, quality metrics, and best practice recommendations. A written personalized care plan for preventive services as well as general preventive health recommendations were provided to patient.   Due to this being a telephonic visit, the after visit summary with patients personalized plan was offered to patient via office  or my-chart. Patient preferred to pick up at office at next visit or via mychart.   Andrez Grime, LPN   [id number]

## 2021-06-18 ENCOUNTER — Telehealth: Payer: Self-pay | Admitting: *Deleted

## 2021-06-18 ENCOUNTER — Telehealth: Payer: Medicare Other

## 2021-06-18 NOTE — Telephone Encounter (Signed)
  Care Management   Follow Up Note   06/18/2021 Name: Julie [name] MRN: [id number] DOB: 11/10/1932   Referred by: Tower, [name], MD Reason for referral : No chief complaint on file.   An unsuccessful telephone outreach was attempted today. The patient was referred to the case management team for assistance with care management and care coordination.   Follow Up Plan: Telephone follow up appointment with care management team member scheduled for: 06/25/21  Eduard Clos MSW, LCSW Licensed Clinical Social Worker [place] [phone]

## 2021-06-25 ENCOUNTER — Ambulatory Visit (INDEPENDENT_AMBULATORY_CARE_PROVIDER_SITE_OTHER): Payer: Medicare Other | Admitting: *Deleted

## 2021-06-25 DIAGNOSIS — M810 Age-related osteoporosis without current pathological fracture: Secondary | ICD-10-CM | POA: Diagnosis not present

## 2021-06-25 DIAGNOSIS — Z636 Dependent relative needing care at home: Secondary | ICD-10-CM

## 2021-06-25 DIAGNOSIS — R531 Weakness: Secondary | ICD-10-CM

## 2021-06-25 DIAGNOSIS — R5382 Chronic fatigue, unspecified: Secondary | ICD-10-CM

## 2021-06-25 DIAGNOSIS — M48061 Spinal stenosis, lumbar region without neurogenic claudication: Secondary | ICD-10-CM

## 2021-06-26 NOTE — Patient Instructions (Signed)
Visit Information  PATIENT GOALS:  Goals Addressed             This Visit's Progress    Find Help in My Community       Timeframe:  Long-Range Goal Priority:  High Start Date:    04/10/2021                          Expected End Date:    08/302022                   Follow Up Date  07/28/21   -review Civil engineer, contracting (mailed to you) for consideration -discuss long term plans with family -consider hiring more in-home private pay care  - begin a notebook of services in my neighborhood or community - call 211 when I need some help - follow-up on any referrals for help I am given - think ahead to make sure my need does not become an emergency - make a note about what I need to have by the phone or take with me, like an identification card or social security number have a back-up plan - have a back-up plan - make a list of family or friends that I can call    Why is this important?   Knowing how and where to find help for yourself or family in your neighborhood and community is an important skill.  You will want to take some steps to learn how.    Notes:         The patient verbalized understanding of instructions, educational materials, and care plan provided today and declined offer to receive copy of patient instructions, educational materials, and care plan.   Telephone follow up appointment with care management team member scheduled for:07/28/21 Lakaisha Danish MSW, [place] Licensed Clinical Social Worker [place] [phone]

## 2021-06-26 NOTE — Chronic Care Management (AMB) (Signed)
Chronic Care Management    Clinical Social Work Note  06/26/2021 Name: Julie [name] MRN: [id number] DOB: 08/24/1932  Julie [name] is a 87 y.o. year old female who is a primary care patient of Tower, [name], MD. The CCM team was consulted to assist the patient with chronic disease management and/or care coordination needs related to: Level of Care Concerns and Caregiver Stress.   Engaged with patient by telephone for follow up visit in response to provider referral for social work chronic care management and care coordination services.   Consent to Services:  The patient was given information about Chronic Care Management services, agreed to services, and gave verbal consent prior to initiation of services.  Please see initial visit note for detailed documentation.   Patient agreed to services and consent obtained.   Assessment: Review of patient past medical history, allergies, medications, and health status, including review of relevant consultants reports was performed today as part of a comprehensive evaluation and provision of chronic care management and care coordination services.     SDOH (Social Determinants of Health) assessments and interventions performed:    Advanced Directives Status: Not addressed in this encounter.  CCM Care Plan  Allergies  Allergen Reactions   Codeine     REACTION: headache and nausea and vomiting    Outpatient Encounter Medications as of 06/25/2021  Medication Sig   acetaminophen (TYLENOL) 500 MG tablet Take 500 mg by mouth every 6 (six) hours as needed.   Alum Hydroxide-Mag Carbonate 160-105 MG CHEW Chew 1 tablet by mouth as needed (acid reflux).   Cholecalciferol (SM VITAMIN D3) 100 MCG (4000 UT) CAPS Take 4,000 Units by mouth daily.   gabapentin (NEURONTIN) 300 MG capsule TAKE 1 CAPSULE BY MOUTH AT BEDTIME   IBUPROFEN PO Take by mouth.   loperamide (IMODIUM) 2 MG capsule Take by mouth as needed for diarrhea or loose stools.    predniSONE (DELTASONE) 10 MG tablet Take 3 pills once daily by mouth for 3 days, then 2 pills once daily for 3 days, then 1 pill once daily for 3 days and then stop (Patient not taking: Reported on 05/24/2021)   solifenacin (VESICARE) 10 MG tablet Take 1 tablet (10 mg total) by mouth daily.   No facility-administered encounter medications on file as of 06/25/2021.    Patient Active Problem List   Diagnosis Date Noted   Right arm pain 04/17/2021   Headache 04/17/2021   Fatigue 04/17/2021   Tail bone pain 04/03/2021   Urge incontinence 01/22/2021   Pedal edema 01/22/2021   Thoracic back pain 10/23/2020   Left elbow contusion 10/23/2020   Mobility impaired 05/14/2020   Spinal stenosis of lumbar region 02/08/2020   Caregiver stress 02/08/2020   Urinary frequency 02/08/2020   Change of skin color 08/31/2018   Encounter for screening mammogram for breast cancer 08/24/2017   Parkinsonism ([place]) 08/24/2017   Routine general medical examination at a health care facility 08/08/2016   Left knee pain 02/01/2016   Total knee replacement status 10/15/2015   B12 deficiency 07/16/2015   Peripheral neuropathy 07/16/2015   Chorea 07/16/2015   Phalanx fracture, foot 01/10/2015   Colon cancer screening 09/22/2014   Osteoarthritis of left lower extremity 09/09/2014   History of falling 04/26/2014   Poor balance 04/26/2014   Left-sided weakness 04/26/2014   Encounter for Medicare annual wellness exam 07/13/2013   Risk for falls 07/13/2013   Skin tag of labia 03/14/2013   Weakness of left leg 07/30/2011  BACK PAIN, LUMBAR 12/10/2010   Vitamin D deficiency 08/16/2010   GERD 03/26/2010   DYSPEPSIA 03/26/2010   IRRITABLE BOWEL SYNDROME 03/26/2010   Osteoporosis 08/27/2009   Hypertriglyceridemia 07/24/2009   DIVERTICULOSIS, COLON 07/24/2009   OSTEOARTHRITIS 07/24/2009   COLONIC POLYPS, HX OF 07/24/2009   POSTMENOPAUSAL STATUS 07/24/2009    Conditions to be addressed/monitored:  caregiver  stress ; Lacks knowledge of community resources and caregiver stress  Care Plan : LCSW Plan of Care  Updates made by Julie Peer, LCSW since 06/26/2021 12:00 AM     Problem: Caregiver Stress due to spouse's cognitive decline   Priority: High  Note:   Current barriers:   Patient in need of assistance with connecting to community resources for Limited social support, Transportation, Lacks knowledge of community resources to aide in caregiving for spouse with dementia in the home, and overall long term care planning needs Acknowledges deficits with meeting this unmet need Patient is unable to independently navigate community resource options without care coordination support Clinical Goals:  -patient will work with SW to address concerns related to caregiver strain/stress and resources/support available to aide in caring for spouse -patient will follow up with community resources provided (via mail and other) as directed by SW Clinical Interventions:  Per pt, she now has 2 caregivers she pays privately to help them out. "One will take me to run errands while the other stays with him. This is helping some- reminded pt to review material mailed to them for resources in community and CSW will follow up in the next 4 weeks. CSW made initial outreach call to pt and spoke with her about the stress she is under as caregiver to her husband with dementia. Per pt, she and her husband of 26 years moved to [place] in October 2021. They have 3 meals provided daily in the dining hall, have activities and some transportation support. Per pt, her husband is fairly independent and able to with his ADL's but does require supervision; as well he "doesn't want me out of his sight" so she has little time alone.  CSW talked at length with pt about resources; their income is too high for Medicaid. She does not want to pursue any sort of ALF/memory care placement for him at  this time.  She does have some hired assistance for helping her to run errands as she is not driving due to vision issues post aneurysm.  CSW discussed possible increasing their private pay support to include some in home support for pt's spouse so pt can have some relief/breaks.   CSW also suggested conversations with extended family; his daughter is 36 and her daughter is her 30- she has given me permission to speak to both and provided me with their numbers-  CSW was able to make contact with pt's daughter in law, Najaya Holdaway (HCPOA to Mr Gatzemeyer) who lives in [place]. She shared that they are looking into getting some additional in home caregiver support to help with needs for Mr Hewell. She also shared that there are some local family members that live nearby who could help in an emergency; pt only mentioned out of town family.  CSW talked with daughter in law about potential resources that may help; including those discussed with pt as well as some websites/you-tube sites for support/educational/insight that may be helpful for dealing with dementia; behaviors/communication/etc.  Pt's daughter in law reassured CSW that the family is involved, working together well (with  the blended family) and feels they are on a good path for planning and providing the care/needs for both.     Collaboration with Tower, [name], MD regarding development and update of comprehensive plan of care as evidenced by provider attestation and co-signature Inter-disciplinary care team collaboration (see longitudinal plan of care) Assessment of needs, barriers , agencies contacted, as well as how impacting  Review various resources, discussed options and provided patient information about Limited social support, Level of care concerns, Social Isolation, Limited access to caregiver, and Lacks knowledge of community resources Collaborated with appropriate clinical care team members regarding patient needs Lacks knowledge of  community resource:s  Anxiety and caregiver stress Patient interviewed and appropriate assessments performed Referred patient to community resources care guide team for assistance with Adult Day Care, transportation, PACE and in-home assistance/caregiver support services Provided patient with information about private duty care, long term care facility options, community program/services Discussed plans with patient for ongoing care management follow up and provided patient with direct contact information for care management team Consult placed to Care Guide for follow up with community resource linking for transportation, Adult Day Care, PACE, [employer] program, etc Advised patient to review resources, discuss with family plans for long term planning as well as for current and "in case of emergency"  Collaborated with primary care provider  Assisted patient/caregiver with obtaining information about health plan benefits- not eligible for Medicaid  Provided education to patient/caregiver regarding level of care options. Other interventions provided: Solution-Focused Strategies, Active listening / Reflection utilized , Emotional Supportive Provided, Problem Solving /Task Center , Motivational Interviewing, Caregiver stress acknowledged , Participation in support group encouraged , and Consideration of in-home help encouraged  Patient Goals:  -review Civil engineer, contracting (mailed to you) for consideration -discuss long term plans with family -consider hiring more in-home private pay care  - begin a notebook of services in my neighborhood or community - call 211 when I need some help - follow-up on any referrals for help I am given - think ahead to make sure my need does not become an emergency - make a note about what I need to have by the phone or take with me, like an identification card or social security number have a back-up plan - have a back-up plan - make a list of family or  friends that I can call  -  Follow Up Plan: Appointment scheduled for SW follow up with client by phone on: 07/28/2021      Follow Up Plan: Appointment scheduled for SW follow up with client by phone on: 07/28/21      Eduard Clos MSW, [place] Licensed Clinical Social Worker [place] [phone]

## 2021-07-05 DIAGNOSIS — M79674 Pain in right toe(s): Secondary | ICD-10-CM | POA: Diagnosis not present

## 2021-07-05 DIAGNOSIS — M79675 Pain in left toe(s): Secondary | ICD-10-CM | POA: Diagnosis not present

## 2021-07-05 DIAGNOSIS — B351 Tinea unguium: Secondary | ICD-10-CM | POA: Diagnosis not present

## 2021-07-16 ENCOUNTER — Telehealth: Payer: Self-pay | Admitting: *Deleted

## 2021-07-16 NOTE — Telephone Encounter (Signed)
CSW received phone call today from pt's step- daughter regarding pt's spouse; Catana Crofton. Family is concerned that pt's spouse is having some significant behavior changes and believe it may be related to a med change. Per family, pt's spouse has a new Alzheimer medication and has had some more challenging situations with him. CSW acknowledged the concerns and the impact this has on the pt/wife.  CSW encouraged family to reach out to Mr Olafson's PCP and request he be see (rule out UTI,etc). CSW had a lengthy conversation with her also about the options they are considering; hiring additional help for the [place], versus considering higher level of care for Mr Brecker as they are aware of it's toll and becoming too much for wife and family to manage- discussed costs, pros and cons- family prefers to keep them together if they can..... Daughter appreciative of suggestions and support.   Eduard Clos MSW, LCSW Licensed Clinical Social Worker [place] [phone]

## 2021-07-29 ENCOUNTER — Ambulatory Visit (INDEPENDENT_AMBULATORY_CARE_PROVIDER_SITE_OTHER): Payer: Medicare Other | Admitting: *Deleted

## 2021-07-29 DIAGNOSIS — M810 Age-related osteoporosis without current pathological fracture: Secondary | ICD-10-CM | POA: Diagnosis not present

## 2021-07-29 DIAGNOSIS — Z636 Dependent relative needing care at home: Secondary | ICD-10-CM

## 2021-07-29 DIAGNOSIS — R5382 Chronic fatigue, unspecified: Secondary | ICD-10-CM

## 2021-07-29 NOTE — Chronic Care Management (AMB) (Signed)
Chronic Care Management    Clinical Social Work Note  07/29/2021 Name: Julie [name] MRN: [id number] DOB: January 23, 1932  Julie [name] is a 87 y.o. year old female who is a primary care patient of Tower, [name], MD. The CCM team was consulted to assist the patient with chronic disease management and/or care coordination needs related to: [employer] , Level of Care Concerns, and Caregiver Stress.   Engaged with patient by telephone for follow up visit in response to provider referral for social work chronic care management and care coordination services.   Consent to Services:  The patient was given information about Chronic Care Management services, agreed to services, and gave verbal consent prior to initiation of services.  Please see initial visit note for detailed documentation.   Patient agreed to services and consent obtained.   Assessment: Review of patient past medical history, allergies, medications, and health status, including review of relevant consultants reports was performed today as part of a comprehensive evaluation and provision of chronic care management and care coordination services.     SDOH (Social Determinants of Health) assessments and interventions performed:    Advanced Directives Status: Not addressed in this encounter.  CCM Care Plan  Allergies  Allergen Reactions   Codeine     REACTION: headache and nausea and vomiting    Outpatient Encounter Medications as of 07/29/2021  Medication Sig   acetaminophen (TYLENOL) 500 MG tablet Take 500 mg by mouth every 6 (six) hours as needed.   Alum Hydroxide-Mag Carbonate 160-105 MG CHEW Chew 1 tablet by mouth as needed (acid reflux).   Cholecalciferol (SM VITAMIN D3) 100 MCG (4000 UT) CAPS Take 4,000 Units by mouth daily.   gabapentin (NEURONTIN) 300 MG capsule TAKE 1 CAPSULE BY MOUTH AT BEDTIME   IBUPROFEN PO Take by mouth.   loperamide (IMODIUM) 2 MG capsule Take by mouth as needed for diarrhea or  loose stools.   predniSONE (DELTASONE) 10 MG tablet Take 3 pills once daily by mouth for 3 days, then 2 pills once daily for 3 days, then 1 pill once daily for 3 days and then stop (Patient not taking: Reported on 05/24/2021)   solifenacin (VESICARE) 10 MG tablet Take 1 tablet (10 mg total) by mouth daily.   No facility-administered encounter medications on file as of 07/29/2021.    Patient Active Problem List   Diagnosis Date Noted   Right arm pain 04/17/2021   Headache 04/17/2021   Fatigue 04/17/2021   Tail bone pain 04/03/2021   Urge incontinence 01/22/2021   Pedal edema 01/22/2021   Thoracic back pain 10/23/2020   Left elbow contusion 10/23/2020   Mobility impaired 05/14/2020   Spinal stenosis of lumbar region 02/08/2020   Caregiver stress 02/08/2020   Urinary frequency 02/08/2020   Change of skin color 08/31/2018   Encounter for screening mammogram for breast cancer 08/24/2017   Parkinsonism ([place]) 08/24/2017   Routine general medical examination at a health care facility 08/08/2016   Left knee pain 02/01/2016   Total knee replacement status 10/15/2015   B12 deficiency 07/16/2015   Peripheral neuropathy 07/16/2015   Chorea 07/16/2015   Phalanx fracture, foot 01/10/2015   Colon cancer screening 09/22/2014   Osteoarthritis of left lower extremity 09/09/2014   History of falling 04/26/2014   Poor balance 04/26/2014   Left-sided weakness 04/26/2014   Encounter for Medicare annual wellness exam 07/13/2013   Risk for falls 07/13/2013   Skin tag of labia 03/14/2013   Weakness of  left leg 07/30/2011   BACK PAIN, LUMBAR 12/10/2010   Vitamin D deficiency 08/16/2010   GERD 03/26/2010   DYSPEPSIA 03/26/2010   IRRITABLE BOWEL SYNDROME 03/26/2010   Osteoporosis 08/27/2009   Hypertriglyceridemia 07/24/2009   DIVERTICULOSIS, COLON 07/24/2009   OSTEOARTHRITIS 07/24/2009   COLONIC POLYPS, HX OF 07/24/2009   POSTMENOPAUSAL STATUS 07/24/2009    Conditions to be addressed/monitored:  Anxiety, Depression, and caregiver stress ;     Care Plan : LCSW Plan of Care  Updates made by Deirdre Peer, LCSW since 07/29/2021 12:00 AM     Problem: Caregiver Stress due to spouse's cognitive decline   Priority: High  Note:   Current barriers:   Patient in need of assistance with connecting to community resources for Limited social support, Transportation, Lacks knowledge of community resources to aide in caregiving for spouse with dementia in the home, and overall long term care planning needs Acknowledges deficits with meeting this unmet need Patient is unable to independently navigate community resource options without care coordination support Clinical Goals:  -patient will work with SW to address concerns related to caregiver strain/stress and resources/support available to aide in caring for spouse -patient will follow up with community resources provided (via mail and other) as directed by SW Clinical Interventions:  Spoke with family member today who reports they have adequate hired in-home caregivers at this time. "One of the caregivers found out she has stomach cancer and is having surgery today.... but we have alternate names and backups to reach out to if needed".   Pt's spouse was taken off one of the RX that family felt was causing him to have negative symptoms/behavior and are following up with his PCP after one week off of it to report and further assess.   Family feels things are ok and "as good as they can be". Reminded them to call if needs arise- CSW will follow up in the next 4 weeks.   CSW made initial outreach call to pt and spoke with her about the stress she is under as caregiver to her husband with dementia. Per pt, she and her husband of 26 years moved to [place] in October 2021. They have 3 meals provided daily in the dining hall, have activities and some transportation support. Per pt, her husband is fairly  independent and able to with his ADL's but does require supervision; as well he "doesn't want me out of his sight" so she has little time alone.  CSW talked at length with pt about resources; their income is too high for Medicaid. She does not want to pursue any sort of ALF/memory care placement for him at this time.  She does have some hired assistance for helping her to run errands as she is not driving due to vision issues post aneurysm.  CSW discussed possible increasing their private pay support to include some in home support for pt's spouse so pt can have some relief/breaks.   CSW also suggested conversations with extended family; his daughter is 52 and her daughter is her 57- she has given me permission to speak to both and provided me with their numbers-  CSW was able to make contact with pt's daughter in law, Julie [name] (HCPOA to Mr Schuck) who lives in [place]. She shared that they are looking into getting some additional in home caregiver support to help with needs for Mr Echavarria. She also shared that there are some local family members that live nearby  who could help in an emergency; pt only mentioned out of town family.  CSW talked with daughter in law about potential resources that may help; including those discussed with pt as well as some websites/you-tube sites for support/educational/insight that may be helpful for dealing with dementia; behaviors/communication/etc.  Pt's daughter in law reassured CSW that the family is involved, working together well (with the blended family) and feels they are on a good path for planning and providing the care/needs for both.     Collaboration with Tower, [name], MD regarding development and update of comprehensive plan of care as evidenced by provider attestation and co-signature Inter-disciplinary care team collaboration (see longitudinal plan of care) Assessment of needs, barriers , agencies contacted, as well as how impacting  Review various  resources, discussed options and provided patient information about Limited social support, Level of care concerns, Social Isolation, Limited access to caregiver, and Lacks knowledge of community resources Collaborated with appropriate clinical care team members regarding patient needs Lacks knowledge of community resource:s  Anxiety and caregiver stress Patient interviewed and appropriate assessments performed Referred patient to community resources care guide team for assistance with Adult Day Care, transportation, PACE and in-home assistance/caregiver support services Provided patient with information about private duty care, long term care facility options, community program/services Discussed plans with patient for ongoing care management follow up and provided patient with direct contact information for care management team Consult placed to Care Guide for follow up with community resource linking for transportation, Adult Day Care, PACE, [employer] program, etc Advised patient to review resources, discuss with family plans for long term planning as well as for current and "in case of emergency"  Collaborated with primary care provider  Assisted patient/caregiver with obtaining information about health plan benefits- not eligible for Medicaid  Provided education to patient/caregiver regarding level of care options. Other interventions provided: Solution-Focused Strategies, Active listening / Reflection utilized , Emotional Supportive Provided, Problem Solving /Task Center , Motivational Interviewing, Caregiver stress acknowledged , Participation in support group encouraged , and Consideration of in-home help encouraged  Patient Goals:  -review Civil engineer, contracting (mailed to you) for consideration -discuss long term plans with family -consider hiring more in-home private pay care  - begin a notebook of services in my neighborhood or community - call 211 when I need some help -  follow-up on any referrals for help I am given - think ahead to make sure my need does not become an emergency - make a note about what I need to have by the phone or take with me, like an identification card or social security number have a back-up plan - have a back-up plan - make a list of family or friends that I can call  -  Follow Up Plan: Appointment scheduled for SW follow up with client by phone on: 08/22/2021      Follow Up Plan: Appointment scheduled for SW follow up with client by phone on: 08/22/21      Eduard Clos MSW, [place] Licensed Clinical Social Worker [place] [phone]

## 2021-07-29 NOTE — Patient Instructions (Signed)
Visit Information  PATIENT GOALS:  Goals Addressed             This Visit's Progress    Find Help in My Community       Timeframe:  Long-Range Goal Priority:  High Start Date:    04/10/2021                          Expected End Date:    09/302022                   Follow Up Date  08/22/21   -review Civil engineer, contracting (mailed to you) for consideration -discuss long term plans with family -consider hiring more in-home private pay care  - begin a notebook of services in my neighborhood or community - call 211 when I need some help - follow-up on any referrals for help I am given - think ahead to make sure my need does not become an emergency - make a note about what I need to have by the phone or take with me, like an identification card or social security number have a back-up plan - have a back-up plan - make a list of family or friends that I can call    Why is this important?   Knowing how and where to find help for yourself or family in your neighborhood and community is an important skill.  You will want to take some steps to learn how.    Notes:         The patient verbalized understanding of instructions, educational materials, and care plan provided today and declined offer to receive copy of patient instructions, educational materials, and care plan.   Telephone follow up appointment with care management team member scheduled for:08/22/21 Eduard Clos MSW, [place] Licensed Clinical Social Worker [place] [phone]

## 2021-08-22 ENCOUNTER — Other Ambulatory Visit: Payer: Self-pay

## 2021-08-22 ENCOUNTER — Ambulatory Visit (INDEPENDENT_AMBULATORY_CARE_PROVIDER_SITE_OTHER): Payer: Medicare Other | Admitting: *Deleted

## 2021-08-22 DIAGNOSIS — M5442 Lumbago with sciatica, left side: Secondary | ICD-10-CM

## 2021-08-22 DIAGNOSIS — G8929 Other chronic pain: Secondary | ICD-10-CM

## 2021-08-22 DIAGNOSIS — Z636 Dependent relative needing care at home: Secondary | ICD-10-CM

## 2021-08-22 DIAGNOSIS — R5382 Chronic fatigue, unspecified: Secondary | ICD-10-CM

## 2021-08-22 DIAGNOSIS — R531 Weakness: Secondary | ICD-10-CM

## 2021-08-22 DIAGNOSIS — M48061 Spinal stenosis, lumbar region without neurogenic claudication: Secondary | ICD-10-CM

## 2021-08-22 DIAGNOSIS — M810 Age-related osteoporosis without current pathological fracture: Secondary | ICD-10-CM

## 2021-08-22 DIAGNOSIS — M79601 Pain in right arm: Secondary | ICD-10-CM

## 2021-08-22 NOTE — Chronic Care Management (AMB) (Deleted)
Chronic Care Management    Clinical Social Work Note  08/22/2021 Name: Julie [name] MRN: [id number] DOB: Apr 13, 1932  Julie [name] is a 87 y.o. year old female who is a primary care patient of Tower, [name], MD. The CCM team was consulted to assist the patient with chronic disease management and/or care coordination needs related to: {CCM SW CONSULT REASONS:25078}.   {CCMTELEPHONEFACETOFACE:21091510} for {CCMINITIALFOLLOWUPCHOICE:21091511} in response to provider referral for social work chronic care management and care coordination services.   Consent to Services:  {CCMCONSENTOPTIONS:25074}  Patient agreed to services and consent obtained.   Assessment: Review of patient past medical history, allergies, medications, and health status, including review of relevant consultants reports was performed today as part of a comprehensive evaluation and provision of chronic care management and care coordination services.     SDOH (Social Determinants of Health) assessments and interventions performed:    Advanced Directives Status: {Advanced Directives Status:25048}  CCM Care Plan  Allergies  Allergen Reactions   Codeine     REACTION: headache and nausea and vomiting    Outpatient Encounter Medications as of 08/22/2021  Medication Sig   acetaminophen (TYLENOL) 500 MG tablet Take 500 mg by mouth every 6 (six) hours as needed.   Alum Hydroxide-Mag Carbonate 160-105 MG CHEW Chew 1 tablet by mouth as needed (acid reflux).   Cholecalciferol (SM VITAMIN D3) 100 MCG (4000 UT) CAPS Take 4,000 Units by mouth daily.   gabapentin (NEURONTIN) 300 MG capsule TAKE 1 CAPSULE BY MOUTH AT BEDTIME   IBUPROFEN PO Take by mouth.   loperamide (IMODIUM) 2 MG capsule Take by mouth as needed for diarrhea or loose stools.   predniSONE (DELTASONE) 10 MG tablet Take 3 pills once daily by mouth for 3 days, then 2 pills once daily for 3 days, then 1 pill once daily for 3 days and then stop (Patient not  taking: Reported on 05/24/2021)   solifenacin (VESICARE) 10 MG tablet Take 1 tablet (10 mg total) by mouth daily.   No facility-administered encounter medications on file as of 08/22/2021.    Patient Active Problem List   Diagnosis Date Noted   Right arm pain 04/17/2021   Headache 04/17/2021   Fatigue 04/17/2021   Tail bone pain 04/03/2021   Urge incontinence 01/22/2021   Pedal edema 01/22/2021   Thoracic back pain 10/23/2020   Left elbow contusion 10/23/2020   Mobility impaired 05/14/2020   Spinal stenosis of lumbar region 02/08/2020   Caregiver stress 02/08/2020   Urinary frequency 02/08/2020   Change of skin color 08/31/2018   Encounter for screening mammogram for breast cancer 08/24/2017   Parkinsonism ([place]) 08/24/2017   Routine general medical examination at a health care facility 08/08/2016   Left knee pain 02/01/2016   Total knee replacement status 10/15/2015   B12 deficiency 07/16/2015   Peripheral neuropathy 07/16/2015   Chorea 07/16/2015   Phalanx fracture, foot 01/10/2015   Colon cancer screening 09/22/2014   Osteoarthritis of left lower extremity 09/09/2014   History of falling 04/26/2014   Poor balance 04/26/2014   Left-sided weakness 04/26/2014   Encounter for Medicare annual wellness exam 07/13/2013   Risk for falls 07/13/2013   Skin tag of labia 03/14/2013   Weakness of left leg 07/30/2011   BACK PAIN, LUMBAR 12/10/2010   Vitamin D deficiency 08/16/2010   GERD 03/26/2010   DYSPEPSIA 03/26/2010   IRRITABLE BOWEL SYNDROME 03/26/2010   Osteoporosis 08/27/2009   Hypertriglyceridemia 07/24/2009   DIVERTICULOSIS, COLON 07/24/2009   OSTEOARTHRITIS  07/24/2009   COLONIC POLYPS, HX OF 07/24/2009   POSTMENOPAUSAL STATUS 07/24/2009    Conditions to be addressed/monitored: {CCM ASSESSMENT DZ OPTIONS:25047}; {CCM SW BARRIERS:25076}  Care Plan : LCSW Plan of Care  Updates made by Deirdre Peer, LCSW since 08/22/2021 12:00 AM     Problem: Caregiver Stress  due to spouse's cognitive decline Resolved 08/22/2021  Priority: High  Note:   Current barriers:   Patient in need of assistance with connecting to community resources for Limited social support, Transportation, Lacks knowledge of community resources to aide in caregiving for spouse with dementia in the home, and overall long term care planning needs Acknowledges deficits with meeting this unmet need Patient is unable to independently navigate community resource options without care coordination support Clinical Goals:  -patient will work with SW to address concerns related to caregiver strain/stress and resources/support available to aide in caring for spouse -patient will follow up with community resources provided (via mail and other) as directed by SW Clinical Interventions:  Spoke with family member today who reports they have adequate hired in-home caregivers at this time as well as backups to reach out to if needed.  ly feels things are ok and "as good as they can be". Reminded them to call if needs arise- CSW will sign off as things are in place to aide wife in care.    CSW made initial outreach call to pt and spoke with her about the stress she is under as caregiver to her husband with dementia. Per pt, she and her husband of 26 years moved to [place] in October 2021. They have 3 meals provided daily in the dining hall, have activities and some transportation support. Per pt, her husband is fairly independent and able to with his ADL's but does require supervision; as well he "doesn't want me out of his sight" so she has little time alone.  CSW talked at length with pt about resources; their income is too high for Medicaid. She does not want to pursue any sort of ALF/memory care placement for him at this time.  She does have some hired assistance for helping her to run errands as she is not driving due to vision issues post aneurysm.  CSW discussed  possible increasing their private pay support to include some in home support for pt's spouse so pt can have some relief/breaks.   CSW also suggested conversations with extended family; his daughter is 45 and her daughter is her 18- she has given me permission to speak to both and provided me with their numbers-  CSW was able to make contact with pt's daughter in law, Velva Molinari (HCPOA to Mr Ragon) who lives in [place]. She shared that they are looking into getting some additional in home caregiver support to help with needs for Mr Han[place]. She also shared that there are some local family members that live nearby who could help in an emergency; pt only mentioned out of town family.  CSW talked with daughter in law about potential resources that may help; including those discussed with pt as well as some websites/you-tube sites for support/educational/insight that may be helpful for dealing with dementia; behaviors/communication/etc.  Pt's daughter in law reassured CSW that the family is involved, working together well (with the blended family) and feels they are on a good path for planning and providing the care/needs for both.     Collaboration with Tower, [name], MD regarding development and update of  comprehensive plan of care as evidenced by provider attestation and co-signature Inter-disciplinary care team collaboration (see longitudinal plan of care) Assessment of needs, barriers , agencies contacted, as well as how impacting  Review various resources, discussed options and provided patient information about Limited social support, Level of care concerns, Social Isolation, Limited access to caregiver, and Lacks knowledge of community resources Collaborated with appropriate clinical care team members regarding patient needs Lacks knowledge of community resource:s  Anxiety and caregiver stress Patient interviewed and appropriate assessments performed Referred patient to community resources care  guide team for assistance with Adult Day Care, transportation, PACE and in-home assistance/caregiver support services Provided patient with information about private duty care, long term care facility options, community program/services Discussed plans with patient for ongoing care management follow up and provided patient with direct contact information for care management team Consult placed to Care Guide for follow up with community resource linking for transportation, Adult Day Care, PACE, [employer] program, etc Advised patient to review resources, discuss with family plans for long term planning as well as for current and "in case of emergency"  Collaborated with primary care provider  Assisted patient/caregiver with obtaining information about health plan benefits- not eligible for Medicaid  Provided education to patient/caregiver regarding level of care options. Other interventions provided: Solution-Focused Strategies, Active listening / Reflection utilized , Emotional Supportive Provided, Problem Solving /Task Center , Motivational Interviewing, Caregiver stress acknowledged , Participation in support group encouraged , and Consideration of in-home help encouraged  Patient Goals:  -review Civil engineer, contracting (mailed to you) for consideration -discuss long term plans with family -consider hiring more in-home private pay care  - begin a notebook of services in my neighborhood or community - call 211 when I need some help - follow-up on any referrals for help I am given - think ahead to make sure my need does not become an emergency - make a note about what I need to have by the phone or take with me, like an identification card or social security number have a back-up plan - have a back-up plan - make a list of family or friends that I can call  -  Follow Up Plan: Client will reach out if needs arise       Follow Up Plan: {CCM SW FOLLOW UP LPFX:90240}      SIG***

## 2021-08-22 NOTE — Chronic Care Management (AMB) (Signed)
Clinical Social Work Note  08/22/2021 Name: Julie [name] MRN: [id number] DOB: December 10, 1931  Julie [name] is a 87 y.o. year old female who is a primary care patient of Tower, [name], MD. The CCM team was consulted to assist the patient with chronic disease management and/or care coordination needs related to: [employer] , Level of Care Concerns, and Caregiver Stress.   Engaged with patient by telephone for follow up visit in response to provider referral for social work chronic care management and care coordination services.   Consent to Services:  The patient was given information about Chronic Care Management services, agreed to services, and gave verbal consent prior to initiation of services.  Please see initial visit note for detailed documentation.   Patient agreed to services and consent obtained.   Assessment: Review of patient past medical history, allergies, medications, and health status, including review of relevant consultants reports was performed today as part of a comprehensive evaluation and provision of chronic care management and care coordination services.     SDOH (Social Determinants of Health) assessments and interventions performed:    Advanced Directives Status: Not addressed in this encounter.  CCM Care Plan  Allergies  Allergen Reactions   Codeine     REACTION: headache and nausea and vomiting    Outpatient Encounter Medications as of 08/22/2021  Medication Sig   acetaminophen (TYLENOL) 500 MG tablet Take 500 mg by mouth every 6 (six) hours as needed.   Alum Hydroxide-Mag Carbonate 160-105 MG CHEW Chew 1 tablet by mouth as needed (acid reflux).   Cholecalciferol (SM VITAMIN D3) 100 MCG (4000 UT) CAPS Take 4,000 Units by mouth daily.   gabapentin (NEURONTIN) 300 MG capsule TAKE 1 CAPSULE BY MOUTH AT BEDTIME   IBUPROFEN PO Take by mouth.   loperamide (IMODIUM) 2 MG capsule Take by mouth as needed for diarrhea or loose stools.   predniSONE  (DELTASONE) 10 MG tablet Take 3 pills once daily by mouth for 3 days, then 2 pills once daily for 3 days, then 1 pill once daily for 3 days and then stop (Patient not taking: Reported on 05/24/2021)   solifenacin (VESICARE) 10 MG tablet Take 1 tablet (10 mg total) by mouth daily.   No facility-administered encounter medications on file as of 08/22/2021.    Patient Active Problem List   Diagnosis Date Noted   Right arm pain 04/17/2021   Headache 04/17/2021   Fatigue 04/17/2021   Tail bone pain 04/03/2021   Urge incontinence 01/22/2021   Pedal edema 01/22/2021   Thoracic back pain 10/23/2020   Left elbow contusion 10/23/2020   Mobility impaired 05/14/2020   Spinal stenosis of lumbar region 02/08/2020   Caregiver stress 02/08/2020   Urinary frequency 02/08/2020   Change of skin color 08/31/2018   Encounter for screening mammogram for breast cancer 08/24/2017   Parkinsonism ([place]) 08/24/2017   Routine general medical examination at a health care facility 08/08/2016   Left knee pain 02/01/2016   Total knee replacement status 10/15/2015   B12 deficiency 07/16/2015   Peripheral neuropathy 07/16/2015   Chorea 07/16/2015   Phalanx fracture, foot 01/10/2015   Colon cancer screening 09/22/2014   Osteoarthritis of left lower extremity 09/09/2014   History of falling 04/26/2014   Poor balance 04/26/2014   Left-sided weakness 04/26/2014   Encounter for Medicare annual wellness exam 07/13/2013   Risk for falls 07/13/2013   Skin tag of labia 03/14/2013   Weakness of left leg 07/30/2011   BACK  PAIN, LUMBAR 12/10/2010   Vitamin D deficiency 08/16/2010   GERD 03/26/2010   DYSPEPSIA 03/26/2010   IRRITABLE BOWEL SYNDROME 03/26/2010   Osteoporosis 08/27/2009   Hypertriglyceridemia 07/24/2009   DIVERTICULOSIS, COLON 07/24/2009   OSTEOARTHRITIS 07/24/2009   COLONIC POLYPS, HX OF 07/24/2009   POSTMENOPAUSAL STATUS 07/24/2009    Conditions to be addressed/monitored: Dementia; Cognitive  Deficits  Care Plan : LCSW Plan of Care  Updates made by Deirdre Peer, LCSW since 08/22/2021 12:00 AM     Problem: Caregiver Stress due to spouse's cognitive decline Resolved 08/22/2021  Priority: High  Note:   Current barriers:   Patient in need of assistance with connecting to community resources for Limited social support, Transportation, Lacks knowledge of community resources to aide in caregiving for spouse with dementia in the home, and overall long term care planning needs Acknowledges deficits with meeting this unmet need Patient is unable to independently navigate community resource options without care coordination support Clinical Goals:  -patient will work with SW to address concerns related to caregiver strain/stress and resources/support available to aide in caring for spouse -patient will follow up with community resources provided (via mail and other) as directed by SW Clinical Interventions:  Spoke with family member today who reports they have adequate hired in-home caregivers at this time as well as backups to reach out to if needed.  ly feels things are ok and "as good as they can be". Reminded them to call if needs arise- CSW will sign off as things are in place to aide wife in care.    CSW made initial outreach call to pt and spoke with her about the stress she is under as caregiver to her husband with dementia. Per pt, she and her husband of 26 years moved to [place] in October 2021. They have 3 meals provided daily in the dining hall, have activities and some transportation support. Per pt, her husband is fairly independent and able to with his ADL's but does require supervision; as well he "doesn't want me out of his sight" so she has little time alone.  CSW talked at length with pt about resources; their income is too high for Medicaid. She does not want to pursue any sort of ALF/memory care placement for him at this time.   She does have some hired assistance for helping her to run errands as she is not driving due to vision issues post aneurysm.  CSW discussed possible increasing their private pay support to include some in home support for pt's spouse so pt can have some relief/breaks.   CSW also suggested conversations with extended family; his daughter is 53 and her daughter is her 58- she has given me permission to speak to both and provided me with their numbers-  CSW was able to make contact with pt's daughter in law, Julie [name] (HCPOA to Mr Santoyo) who lives in [place]. She shared that they are looking into getting some additional in home caregiver support to help with needs for Mr Masoner. She also shared that there are some local family members that live nearby who could help in an emergency; pt only mentioned out of town family.  CSW talked with daughter in law about potential resources that may help; including those discussed with pt as well as some websites/you-tube sites for support/educational/insight that may be helpful for dealing with dementia; behaviors/communication/etc.  Pt's daughter in law reassured CSW that the family is involved, working together well (  with the blended family) and feels they are on a good path for planning and providing the care/needs for both.     Collaboration with Tower, [name], MD regarding development and update of comprehensive plan of care as evidenced by provider attestation and co-signature Inter-disciplinary care team collaboration (see longitudinal plan of care) Assessment of needs, barriers , agencies contacted, as well as how impacting  Review various resources, discussed options and provided patient information about Limited social support, Level of care concerns, Social Isolation, Limited access to caregiver, and Lacks knowledge of community resources Collaborated with appropriate clinical care team members regarding patient needs Lacks knowledge of community  resource:s  Anxiety and caregiver stress Patient interviewed and appropriate assessments performed Referred patient to community resources care guide team for assistance with Adult Day Care, transportation, PACE and in-home assistance/caregiver support services Provided patient with information about private duty care, long term care facility options, community program/services Discussed plans with patient for ongoing care management follow up and provided patient with direct contact information for care management team Consult placed to Care Guide for follow up with community resource linking for transportation, Adult Day Care, PACE, [employer] program, etc Advised patient to review resources, discuss with family plans for long term planning as well as for current and "in case of emergency"  Collaborated with primary care provider  Assisted patient/caregiver with obtaining information about health plan benefits- not eligible for Medicaid  Provided education to patient/caregiver regarding level of care options. Other interventions provided: Solution-Focused Strategies, Active listening / Reflection utilized , Emotional Supportive Provided, Problem Solving /Task Center , Motivational Interviewing, Caregiver stress acknowledged , Participation in support group encouraged , and Consideration of in-home help encouraged  Patient Goals:  -review Civil engineer, contracting (mailed to you) for consideration -discuss long term plans with family -consider hiring more in-home private pay care  - begin a notebook of services in my neighborhood or community - call 211 when I need some help - follow-up on any referrals for help I am given - think ahead to make sure my need does not become an emergency - make a note about what I need to have by the phone or take with me, like an identification card or social security number have a back-up plan - have a back-up plan - make a list of family or friends  that I can call  -  Follow Up Plan: Client will reach out if needs arise       Follow Up Plan: Client will reach out if further needs arise      Eduard Clos MSW, LCSW Licensed Clinical Social Worker [place] [phone]

## 2021-08-22 NOTE — Patient Instructions (Signed)
Visit Information  PATIENT GOALS:  Goals Addressed             This Visit's Progress    COMPLETED: Find Help in My Community       Timeframe:  Long-Range Goal Priority:  High Start Date:    04/10/2021                          Expected End Date:    09/302022                   Follow Up Date  08/22/21   -review Civil engineer, contracting (mailed to you) for consideration -discuss long term plans with family -consider hiring more in-home private pay care  - begin a notebook of services in my neighborhood or community - call 211 when I need some help - follow-up on any referrals for help I am given - think ahead to make sure my need does not become an emergency - make a note about what I need to have by the phone or take with me, like an identification card or social security number have a back-up plan - have a back-up plan - make a list of family or friends that I can call    Why is this important?   Knowing how and where to find help for yourself or family in your neighborhood and community is an important skill.  You will want to take some steps to learn how.    Notes:         The patient verbalized understanding of instructions, educational materials, and care plan provided today and declined offer to receive copy of patient instructions, educational materials, and care plan.   No further follow up required:    Eduard Clos MSW, LCSW Licensed Clinical Social Worker [place] [phone]

## 2021-08-30 DIAGNOSIS — M810 Age-related osteoporosis without current pathological fracture: Secondary | ICD-10-CM

## 2021-09-24 DIAGNOSIS — M25572 Pain in left ankle and joints of left foot: Secondary | ICD-10-CM | POA: Diagnosis not present

## 2021-09-24 DIAGNOSIS — R600 Localized edema: Secondary | ICD-10-CM | POA: Diagnosis not present

## 2021-09-24 DIAGNOSIS — B351 Tinea unguium: Secondary | ICD-10-CM | POA: Diagnosis not present

## 2021-09-24 DIAGNOSIS — M79674 Pain in right toe(s): Secondary | ICD-10-CM | POA: Diagnosis not present

## 2021-09-24 DIAGNOSIS — M84375A Stress fracture, left foot, initial encounter for fracture: Secondary | ICD-10-CM | POA: Diagnosis not present

## 2021-09-24 DIAGNOSIS — L600 Ingrowing nail: Secondary | ICD-10-CM | POA: Diagnosis not present

## 2021-09-24 DIAGNOSIS — M79672 Pain in left foot: Secondary | ICD-10-CM | POA: Diagnosis not present

## 2021-09-24 DIAGNOSIS — M79675 Pain in left toe(s): Secondary | ICD-10-CM | POA: Diagnosis not present

## 2021-10-11 DIAGNOSIS — M79675 Pain in left toe(s): Secondary | ICD-10-CM | POA: Diagnosis not present

## 2021-10-11 DIAGNOSIS — M84375D Stress fracture, left foot, subsequent encounter for fracture with routine healing: Secondary | ICD-10-CM | POA: Diagnosis not present

## 2021-10-11 DIAGNOSIS — M79674 Pain in right toe(s): Secondary | ICD-10-CM | POA: Diagnosis not present

## 2021-10-11 DIAGNOSIS — M2042 Other hammer toe(s) (acquired), left foot: Secondary | ICD-10-CM | POA: Diagnosis not present

## 2021-10-11 DIAGNOSIS — R600 Localized edema: Secondary | ICD-10-CM | POA: Diagnosis not present

## 2021-10-12 ENCOUNTER — Other Ambulatory Visit (INDEPENDENT_AMBULATORY_CARE_PROVIDER_SITE_OTHER): Payer: Self-pay | Admitting: Specialist

## 2022-01-20 DIAGNOSIS — B351 Tinea unguium: Secondary | ICD-10-CM | POA: Diagnosis not present

## 2022-01-20 DIAGNOSIS — M79675 Pain in left toe(s): Secondary | ICD-10-CM | POA: Diagnosis not present

## 2022-01-20 DIAGNOSIS — M79674 Pain in right toe(s): Secondary | ICD-10-CM | POA: Diagnosis not present

## 2022-01-22 ENCOUNTER — Other Ambulatory Visit: Payer: Self-pay

## 2022-01-22 ENCOUNTER — Ambulatory Visit (INDEPENDENT_AMBULATORY_CARE_PROVIDER_SITE_OTHER): Payer: Medicare Other | Admitting: Family Medicine

## 2022-01-22 ENCOUNTER — Encounter: Payer: Self-pay | Admitting: Family Medicine

## 2022-01-22 VITALS — BP 124/80 | HR 75 | Temp 98.4°F | Ht 62.0 in | Wt 174.0 lb

## 2022-01-22 DIAGNOSIS — Z0000 Encounter for general adult medical examination without abnormal findings: Secondary | ICD-10-CM

## 2022-01-22 DIAGNOSIS — E538 Deficiency of other specified B group vitamins: Secondary | ICD-10-CM

## 2022-01-22 DIAGNOSIS — Z636 Dependent relative needing care at home: Secondary | ICD-10-CM | POA: Diagnosis not present

## 2022-01-22 DIAGNOSIS — G20 Parkinson's disease: Secondary | ICD-10-CM

## 2022-01-22 DIAGNOSIS — E559 Vitamin D deficiency, unspecified: Secondary | ICD-10-CM | POA: Diagnosis not present

## 2022-01-22 DIAGNOSIS — E781 Pure hyperglyceridemia: Secondary | ICD-10-CM

## 2022-01-22 DIAGNOSIS — G588 Other specified mononeuropathies: Secondary | ICD-10-CM | POA: Diagnosis not present

## 2022-01-22 DIAGNOSIS — J3489 Other specified disorders of nose and nasal sinuses: Secondary | ICD-10-CM

## 2022-01-22 DIAGNOSIS — M810 Age-related osteoporosis without current pathological fracture: Secondary | ICD-10-CM

## 2022-01-22 DIAGNOSIS — E2839 Other primary ovarian failure: Secondary | ICD-10-CM

## 2022-01-22 DIAGNOSIS — Z7409 Other reduced mobility: Secondary | ICD-10-CM

## 2022-01-22 LAB — CBC WITH DIFFERENTIAL/PLATELET
Basophils Absolute: 0 10*3/uL (ref 0.0–0.1)
Basophils Relative: 1 % (ref 0.0–3.0)
Eosinophils Absolute: 0.2 10*3/uL (ref 0.0–0.7)
Eosinophils Relative: 3.2 % (ref 0.0–5.0)
HCT: 32.7 % — ABNORMAL LOW (ref 36.0–46.0)
Hemoglobin: 10.9 g/dL — ABNORMAL LOW (ref 12.0–15.0)
Lymphocytes Relative: 22.5 % (ref 12.0–46.0)
Lymphs Abs: 1.2 10*3/uL (ref 0.7–4.0)
MCHC: 33.4 g/dL (ref 30.0–36.0)
MCV: 86.7 fl (ref 78.0–100.0)
Monocytes Absolute: 0.5 10*3/uL (ref 0.1–1.0)
Monocytes Relative: 9.3 % (ref 3.0–12.0)
Neutro Abs: 3.3 10*3/uL (ref 1.4–7.7)
Neutrophils Relative %: 64 % (ref 43.0–77.0)
Platelets: 255 10*3/uL (ref 150.0–400.0)
RBC: 3.78 Mil/uL — ABNORMAL LOW (ref 3.87–5.11)
RDW: 15.6 % — ABNORMAL HIGH (ref 11.5–15.5)
WBC: 5.2 10*3/uL (ref 4.0–10.5)

## 2022-01-22 LAB — COMPREHENSIVE METABOLIC PANEL
ALT: 5 U/L (ref 0–35)
AST: 11 U/L (ref 0–37)
Albumin: 3.9 g/dL (ref 3.5–5.2)
Alkaline Phosphatase: 89 U/L (ref 39–117)
BUN: 17 mg/dL (ref 6–23)
CO2: 30 mEq/L (ref 19–32)
Calcium: 8.9 mg/dL (ref 8.4–10.5)
Chloride: 105 mEq/L (ref 96–112)
Creatinine, Ser: 1 mg/dL (ref 0.40–1.20)
GFR: 50.43 mL/min — ABNORMAL LOW (ref >60.00)
Glucose, Bld: 88 mg/dL (ref 70–99)
Potassium: 3.9 mEq/L (ref 3.5–5.1)
Sodium: 139 mEq/L (ref 135–145)
Total Bilirubin: 0.6 mg/dL (ref 0.2–1.2)
Total Protein: 6.8 g/dL (ref 6.0–8.3)

## 2022-01-22 LAB — LIPID PANEL
Cholesterol: 189 mg/dL (ref 0–200)
HDL: 45.8 mg/dL (ref >39.00)
LDL Cholesterol: 110 mg/dL — ABNORMAL HIGH (ref 0–99)
NonHDL: 143.53
Total CHOL/HDL Ratio: 4
Triglycerides: 169 mg/dL — ABNORMAL HIGH (ref 0.0–149.0)
VLDL: 33.8 mg/dL (ref 0.0–40.0)

## 2022-01-22 LAB — VITAMIN B12: Vitamin B-12: 84 pg/mL — ABNORMAL LOW (ref 211–911)

## 2022-01-22 LAB — TSH: TSH: 1.3 u[IU]/mL (ref 0.35–5.50)

## 2022-01-22 LAB — VITAMIN D 25 HYDROXY (VIT D DEFICIENCY, FRACTURES): VITD: 27.13 ng/mL — ABNORMAL LOW (ref 30.00–100.00)

## 2022-01-22 NOTE — Patient Instructions (Addendum)
If you are interested in the new shingles vaccine (Shingrix) - call your local pharmacy to check on coverage and availability  If affordable, get on a wait list at your pharmacy to get the vaccine.  For runny nose and cough -try an antihistamine Zyrtec or allegra or claritin are good (over the counter)   Let us know if cough gets worse or if you run a fever or have trouble breathing   Labs today  Please schedule your bone density test at [place] You may need medication to prevent more broken bones (fosamax)  We may want to consider it   Please call the location of your choice from the menu below to schedule your Mammogram and/or Bone Density appointment.    [facility]                      Phone:  [phone] [address], [place] [address]                                                             Services: Traditional and 3D Mammogram, [place] Bone Density                 Phone: [phone] [address], [place] [address]    Service: Bone Density ONLY   *this site does NOT perform mammograms  [facility]                        Phone:  [phone] [address], [place] [address]                                            Services:  3D Mammogram and [place] at [facility]   Phone:  [phone]   [address], [place] [address]  Services: 3D Mammogram and Bone Density  [place] at [facility])  Phone:  [phone]   [address] Room [place], [place]  [address]                                              Services:  3D Mammogram and Bone Density

## 2022-01-22 NOTE — Assessment & Plan Note (Signed)
Needs more help  In retirement com with husband -dementia and req full time care  She has to be up all night with him Cannot leave to socialize or do errands Community care referral done  May want to consider asst living facility

## 2022-01-22 NOTE — Assessment & Plan Note (Signed)
Reviewed health habits including diet and exercise and skin cancer prevention Reviewed appropriate screening tests for age  Also reviewed health mt list, fam hx and immunization status , as well as social and family history   See HPI Labs ordered amw rev  May check on shingrix coverage/she is unsure dexa ordered-she will schedule  Ref done for community care for help with safety and caregiving  Declines breast cancer screening/mammogram  Out aged colon screening

## 2022-01-22 NOTE — Assessment & Plan Note (Signed)
Pt doubts she has this Stable neuropathy  No medication  Did not return to neuro

## 2022-01-22 NOTE — Assessment & Plan Note (Signed)
Uses waker with seat full time

## 2022-01-22 NOTE — Assessment & Plan Note (Signed)
Continues gabapentin.  

## 2022-01-22 NOTE — Assessment & Plan Note (Signed)
Lab ordered Taking 4000 iu daily  Discussed imp to bone health

## 2022-01-22 NOTE — Assessment & Plan Note (Signed)
B12 level today  Oral supp in the past

## 2022-01-22 NOTE — Assessment & Plan Note (Signed)
dexa ordered Pt will schedule No falls  One new stress fx in foot  Briefly discussed alendronate as option / currently in dental work Taking vit D Mobility impaired

## 2022-01-22 NOTE — Progress Notes (Signed)
Subjective:    Patient ID: Julie [name], female    DOB: March 11, 1932, 88 y.o.   MRN: [id number]  This visit occurred during the SARS-CoV-2 public health emergency.  Safety protocols were in place, including screening questions prior to the visit, additional usage of staff PPE, and extensive cleaning of exam room while observing appropriate contact time as indicated for disinfecting solutions.   HPI Here for health maintenance exam and to review chronic medical problems   Wt Readings from Last 3 Encounters:  01/22/22 174 lb (78.9 kg)  04/17/21 178 lb (80.7 kg)  04/03/21 175 lb 2 oz (79.4 kg)   31.83 kg/m  Had amw in June/reviewed  Looking after husband with dementia It gets harder  Has a caregiver now most days / thinks she need someone ATC She does not sleep all night watching him  She thinks she will need to make a change in the future / ? Asst living (unsure if she would be able to stay with him)  Has a friend moving to [place] and curious to see how she likes it   In a retirement community now   Some sneezing and runny nose in the past few weeks  Some colds at her residence   [place] self care/ cares for her husband   Zoster status:  unsure if interested in shingrix  Is covid vaccinated   Dexa 2014 osteoporosis  Falls -none Fractures -stress fracture of L foot  Supplements -vitamin D   Breast cancer screening  Declines mammograms  No lumps on self exams    BP Readings from Last 3 Encounters:  01/22/22 124/80  04/17/21 136/74  04/03/21 126/70   Pulse Readings from Last 3 Encounters:  01/22/22 75  04/17/21 85  04/03/21 64   Vit B12 def Lab Results  Component Value Date   VITAMINB12 1,176 (H) 02/03/2017   Due for labs  Oral supplementation      H/o elevated triglycerides Lab Results  Component Value Date   CHOL 194 04/03/2021   HDL 45.40 04/03/2021   LDLDIRECT 117.0 04/03/2021   TRIG 204.0 (H) 04/03/2021   CHOLHDL 4 04/03/2021  No  medication  Diet is fair    Patient Active Problem List   Diagnosis Date Noted   Estrogen deficiency 01/22/2022   Rhinorrhea 01/22/2022   Right arm pain 04/17/2021   Headache 04/17/2021   Fatigue 04/17/2021   Urge incontinence 01/22/2021   Pedal edema 01/22/2021   Thoracic back pain 10/23/2020   Mobility impaired 05/14/2020   Spinal stenosis of lumbar region 02/08/2020   Caregiver stress 02/08/2020   Urinary frequency 02/08/2020   Encounter for screening mammogram for breast cancer 08/24/2017   Parkinsonism ([place]) 08/24/2017   Routine general medical examination at a health care facility 08/08/2016   Left knee pain 02/01/2016   Total knee replacement status 10/15/2015   B12 deficiency 07/16/2015   Peripheral neuropathy 07/16/2015   Chorea 07/16/2015   Phalanx fracture, foot 01/10/2015   Colon cancer screening 09/22/2014   Osteoarthritis of left lower extremity 09/09/2014   History of falling 04/26/2014   Poor balance 04/26/2014   Encounter for Medicare annual wellness exam 07/13/2013   Risk for falls 07/13/2013   Skin tag of labia 03/14/2013   Weakness of left leg 07/30/2011   BACK PAIN, LUMBAR 12/10/2010   Vitamin D deficiency 08/16/2010   GERD 03/26/2010   DYSPEPSIA 03/26/2010   IRRITABLE BOWEL SYNDROME 03/26/2010   Osteoporosis 08/27/2009   Hypertriglyceridemia 07/24/2009  DIVERTICULOSIS, COLON 07/24/2009   OSTEOARTHRITIS 07/24/2009   COLONIC POLYPS, HX OF 07/24/2009   POSTMENOPAUSAL STATUS 07/24/2009   Past Medical History:  Diagnosis Date   Adenoma    Chronic leg pain    Colon polyp    DDD (degenerative disc disease), cervical    Diverticulosis of colon    internal----Dr. Vira Agar   Duodenitis    Esophagitis    GERD (gastroesophageal reflux disease)    Hemorrhoids    HLD (hyperlipidemia)    IBS (irritable bowel syndrome)    with PP diarrhea   Neuropathy    Neuropathy    OA (osteoarthritis) of knee    Osteopenia    PONV (postoperative nausea and  vomiting)    Scoliosis    Seborrheic dermatitis    Shortness of breath dyspnea    with activity   TMJ syndrome    Trochanteric bursitis    Unsteady gait    FALLS EASILY   Past Surgical History:  Procedure Laterality Date   CARPAL TUNNEL RELEASE Left 01/18/2013   Procedure: CARPAL TUNNEL RELEASE;  Surgeon: Wynonia Sours, MD;  Location: [place];  Service: Orthopedics;  Laterality: Left;  OSTEOTOMY LEFT DISTAL RADIUS BONE CHIPS CARPAL TUNNEL RELEASE LEFT    CATARACT EXTRACTION W/PHACO Right 08/07/2015   Procedure: CATARACT EXTRACTION PHACO AND INTRAOCULAR LENS PLACEMENT ([place]);  Surgeon: Birder Robson, MD;  Location: [facility];  Service: Ophthalmology;  Laterality: Right;  [place] 00:48.0 AP   22.3 CDE 10.69 casette lot # [id number]   CATARACT EXTRACTION W/PHACO Left 09/04/2015   Procedure: CATARACT EXTRACTION PHACO AND INTRAOCULAR LENS PLACEMENT ([facility]);  Surgeon: Birder Robson, MD;  Location: [facility];  Service: Ophthalmology;  Laterality: Left;  [place] AP CDE FLUID LOT # [id number]   CEREBRAL ANEURYSM REPAIR  2001   clamps   COLONOSCOPY  1/11   polyps-hyperplastic and adenomatous   FOOT SURGERY  2010   bilateral hammer toes and "knot"   FRACTURE SURGERY Right 01/2014   result of a fall   FRACTURE SURGERY Left 03/2014   result of a fall   HEMORRHOID SURGERY     KNEE ARTHROPLASTY Left 10/15/2015   Procedure: COMPUTER ASSISTED TOTAL KNEE ARTHROPLASTY;  Surgeon: Dereck Leep, MD;  Location: [facility];  Service: Orthopedics;  Laterality: Left;   OPEN REDUCTION INTERNAL FIXATION (ORIF) DISTAL RADIAL FRACTURE Right 02/13/2015   Procedure: OPEN REDUCTION INTERNAL FIXATION (ORIF) RIGHT DISTAL RADIUS;  Surgeon: Daryll Brod, MD;  Location: [place];  Service: Orthopedics;  Laterality: Right;   OPEN REDUCTION INTERNAL FIXATION (ORIF) FINGER WITH RADIAL BONE GRAFT Left 11/01/2013   Procedure: OPEN REDUCTION INTERNAL FIXATION (ORIF) LEFT SMALL FINGER;  Surgeon: Wynonia Sours,  MD;  Location: [place];  Service: Orthopedics;  Laterality: Left;   REPLACEMENT TOTAL KNEE Left    TOTAL ABDOMINAL HYSTERECTOMY     WRIST OSTEOTOMY Left 01/18/2013   Procedure: WRIST OSTEOTOMY;  Surgeon: Wynonia Sours, MD;  Location: [place];  Service: Orthopedics;  Laterality: Left;   Social History   Tobacco Use   Smoking status: Former    Types: Cigarettes    Quit date: 10/02/1954    Years since quitting: 67.3   Smokeless tobacco: Never  Vaping Use   Vaping Use: Never used  Substance Use Topics   Alcohol use: No    Alcohol/week: 0.0 standard drinks   Drug use: No   Family History  Problem Relation Age of Onset  Colon cancer Mother    Osteoporosis Other        hip fracture   Stroke Brother    Colon cancer Brother    Hypertension Brother    Other Brother        Heart problem   Colon cancer Brother    Colon cancer Maternal Aunt    Breast cancer Neg Hx    Allergies  Allergen Reactions   Codeine     REACTION: headache and nausea and vomiting   Current Outpatient Medications on File Prior to Visit  Medication Sig Dispense Refill   acetaminophen (TYLENOL) 500 MG tablet Take 500 mg by mouth every 6 (six) hours as needed.     Alum Hydroxide-Mag Carbonate 160-105 MG CHEW Chew 1 tablet by mouth as needed (acid reflux).     Cholecalciferol (SM VITAMIN D3) 100 MCG (4000 UT) CAPS Take 4,000 Units by mouth daily.     gabapentin (NEURONTIN) 300 MG capsule TAKE 1 CAPSULE BY MOUTH AT BEDTIME 90 capsule 3   IBUPROFEN PO Take by mouth.     loperamide (IMODIUM) 2 MG capsule Take by mouth as needed for diarrhea or loose stools.     solifenacin (VESICARE) 10 MG tablet Take 1 tablet (10 mg total) by mouth daily. 30 tablet 1   amoxicillin (AMOXIL) 500 MG capsule Take 500 mg by mouth 4 (four) times daily.     No current facility-administered medications on file prior to visit.    Review of Systems  Constitutional:  Positive for fatigue. Negative for  activity change, appetite change, fever and unexpected weight change.  HENT:  Negative for congestion, ear pain, rhinorrhea, sinus pressure and sore throat.   Eyes:  Negative for pain, redness and visual disturbance.  Respiratory:  Negative for cough, shortness of breath and wheezing.   Cardiovascular:  Negative for chest pain and palpitations.  Gastrointestinal:  Negative for abdominal pain, blood in stool, constipation and diarrhea.  Endocrine: Negative for polydipsia and polyuria.  Genitourinary:  Negative for dysuria, frequency and urgency.  Musculoskeletal:  Positive for arthralgias and back pain. Negative for myalgias.  Skin:  Negative for pallor and rash.  Allergic/Immunologic: Negative for environmental allergies.  Neurological:  Negative for dizziness, syncope and headaches.       Poor balance  Hematological:  Negative for adenopathy. Does not bruise/bleed easily.  Psychiatric/Behavioral:  Negative for decreased concentration and dysphoric mood. The patient is nervous/anxious.       Objective:   Physical Exam Constitutional:      General: She is not in acute distress.    Appearance: Normal appearance. She is well-developed. She is obese. She is not ill-appearing or diaphoretic.  HENT:     Head: Normocephalic and atraumatic.     Right Ear: Tympanic membrane, ear canal and external ear normal.     Left Ear: Tympanic membrane, ear canal and external ear normal.     Nose: Nose normal. No congestion.     Mouth/Throat:     Mouth: Mucous membranes are moist.     Pharynx: Oropharynx is clear. No posterior oropharyngeal erythema.  Eyes:     General: No scleral icterus.    Extraocular Movements: Extraocular movements intact.     Conjunctiva/sclera: Conjunctivae normal.     Pupils: Pupils are equal, round, and reactive to light.  Neck:     Thyroid: No thyromegaly.     Vascular: No carotid bruit or JVD.  Cardiovascular:     Rate and Rhythm: Normal rate  and regular rhythm.      Pulses: Normal pulses.     Heart sounds: Normal heart sounds.    No gallop.  Pulmonary:     Effort: Pulmonary effort is normal. No respiratory distress.     Breath sounds: Normal breath sounds. No wheezing.     Comments: Good air exch Chest:     Chest wall: No tenderness.  Abdominal:     General: Bowel sounds are normal. There is no distension or abdominal bruit.     Palpations: Abdomen is soft. There is no mass.     Tenderness: There is no abdominal tenderness.     Hernia: No hernia is present.  Genitourinary:    Comments:     Musculoskeletal:        General: No tenderness. Normal range of motion.     Cervical back: Normal range of motion and neck supple. No rigidity. No muscular tenderness.     Right lower leg: No edema.     Left lower leg: No edema.     Comments: Mild kyphosis   Lymphadenopathy:     Cervical: No cervical adenopathy.  Skin:    General: Skin is warm and dry.     Coloration: Skin is not pale.     Findings: No erythema or rash.     Comments: Solar lentigines diffusely Some sks  Neurological:     Mental Status: She is alert. Mental status is at baseline.     Cranial Nerves: No cranial nerve deficit.     Motor: No abnormal muscle tone.     Coordination: Coordination normal.     Gait: Gait normal.     Deep Tendon Reflexes: Reflexes are normal and symmetric.     Comments: Baseline poor balance  Uses walker with seat Chorea of R leg   Psychiatric:        Mood and Affect: Mood is anxious.        Cognition and Memory: Cognition and memory normal.          Assessment & Plan:   Problem List Items Addressed This Visit       Nervous and Auditory   Parkinsonism ([place])    Pt doubts she has this Stable neuropathy  No medication  Did not return to neuro      Peripheral neuropathy    Continues gabapentin        Musculoskeletal and Integument   Osteoporosis    dexa ordered Pt will schedule No falls  One new stress fx in foot  Briefly discussed  alendronate as option / currently in dental work Taking vit D Mobility impaired        Other   B12 deficiency    B12 level today  Oral supp in the past      Relevant Orders   Vitamin B12   Caregiver stress    Needs more help  In retirement com with husband -dementia and req full time care  She has to be up all night with him Cannot leave to socialize or do errands Community care referral done  May want to consider asst living facility       Relevant Orders   AMB Referral to [place]   Estrogen deficiency   Relevant Orders   DG Bone Density   Hypertriglyceridemia    Labs today  Diet is fair   Disc goals for lipids and reasons to control them Rev last labs with pt Rev low sat fat  diet in detail       Relevant Orders   Lipid panel   Mobility impaired    Uses waker with seat full time       Rhinorrhea    Allergies vs mild cold Discussed antihistamine tx  May try zyrtec Watch for cough or fever  Update if not starting to improve in a week or if worsening        Routine general medical examination at a health care facility - Primary    Reviewed health habits including diet and exercise and skin cancer prevention Reviewed appropriate screening tests for age  Also reviewed health mt list, fam hx and immunization status , as well as social and family history   See HPI Labs ordered amw rev  May check on shingrix coverage/she is unsure dexa ordered-she will schedule  Ref done for community care for help with safety and caregiving  Declines breast cancer screening/mammogram  Out aged colon screening       Relevant Orders   Comprehensive metabolic panel   CBC with Differential/Platelet   Lipid panel   TSH   Vitamin D deficiency    Lab ordered Taking 4000 iu daily  Discussed imp to bone health      Relevant Orders   VITAMIN D 25 Hydroxy (Vit-D Deficiency, Fractures)

## 2022-01-22 NOTE — Assessment & Plan Note (Signed)
Allergies vs mild cold Discussed antihistamine tx  May try zyrtec Watch for cough or fever  Update if not starting to improve in a week or if worsening

## 2022-01-22 NOTE — Assessment & Plan Note (Signed)
Labs today  Diet is fair   Disc goals for lipids and reasons to control them Rev last labs with pt Rev low sat fat diet in detail

## 2022-01-23 ENCOUNTER — Telehealth: Payer: Self-pay | Admitting: *Deleted

## 2022-01-23 NOTE — Chronic Care Management (AMB) (Signed)
Chronic Care Management   Note  01/23/2022 Name: ZAAKIRAH KISTNER MRN: [id number] DOB: 04/02/1932  Judi Saa Vanlue is a 88 y.o. year old female who is a primary care patient of Tower, [name], MD. I reached out to [name] by phone today in response to a referral sent by Ms. Judi Saa Youngers's PCP.  Ms. Igarashi was given information about Chronic Care Management services today including:  CCM service includes personalized support from designated clinical staff supervised by her physician, including individualized plan of care and coordination with other care providers 24/7 contact phone numbers for assistance for urgent and routine care needs. Service will only be billed when office clinical staff spend 20 minutes or more in a month to coordinate care. Only one practitioner may furnish and bill the service in a calendar month. The patient may stop CCM services at any time (effective at the end of the month) by phone call to the office staff. The patient is responsible for co-pay (up to 20% after annual deductible is met) if co-pay is required by the individual health plan.   Patient agreed to services and verbal consent obtained.   Follow up plan: Telephone appointment with care management team member scheduled for: 01/31/2022  Julian Hy, [place] Management  Direct Dial: [phone]

## 2022-01-28 ENCOUNTER — Telehealth: Payer: Self-pay | Admitting: *Deleted

## 2022-01-28 NOTE — Telephone Encounter (Signed)
-----   Message from Abner Greenspan, MD sent at 01/22/2022  8:48 PM EST ----- New significant anemia  I need to get another lab draw to include iron/ferritin and path review (I think she was iron def in the past)   Vit d is low- how much is she taking ? Vit B12 is very low - is she currently taking any?  I want to plan some B12 shots-optimally one weekly for 4 weeks  Get b12 otc and take 1000 mcg daily oral as well  Re check B12 level in 3 mo Stable cholesterol  Drink water for good renal health

## 2022-01-28 NOTE — Telephone Encounter (Signed)
Left VM requesting pt to call the office back 

## 2022-01-31 ENCOUNTER — Ambulatory Visit (INDEPENDENT_AMBULATORY_CARE_PROVIDER_SITE_OTHER): Payer: Medicare Other | Admitting: *Deleted

## 2022-01-31 DIAGNOSIS — Z636 Dependent relative needing care at home: Secondary | ICD-10-CM

## 2022-01-31 DIAGNOSIS — M810 Age-related osteoporosis without current pathological fracture: Secondary | ICD-10-CM

## 2022-01-31 DIAGNOSIS — E559 Vitamin D deficiency, unspecified: Secondary | ICD-10-CM

## 2022-02-01 NOTE — Chronic Care Management (AMB) (Signed)
?Chronic Care Management  ? ? Clinical Social Work Note ? ?02/01/2022 ?Name: Julie [name] MRN: [id number] DOB: [birth year] ? ?AVAMARIE CROSSLEY is a 88 y.o. year old female who is a primary care patient of Tower, [name], MD. The CCM team was consulted to assist the patient with chronic disease management and/or care coordination needs related to: [employer]  and Caregiver Stress.  ? ?Engaged with patient by telephone for initial visit in response to provider referral for social work chronic care management and care coordination services.  ? ?Consent to Services:  ?The patient was given information about Chronic Care Management services, agreed to services, and gave verbal consent prior to initiation of services.  Please see initial visit note for detailed documentation.  ? ?Patient agreed to services and consent obtained.  ? ?Assessment: Review of patient past medical history, allergies, medications, and health status, including review of relevant consultants reports was performed today as part of a comprehensive evaluation and provision of chronic care management and care coordination services.    ? ?SDOH (Social Determinants of Health) assessments and interventions performed:   ? ?Advanced Directives Status: See Care Plan for related entries. ? ?CCM Care Plan ? ?Allergies  ?Allergen Reactions  ? Codeine   ?  REACTION: headache and nausea and vomiting  ? ? ?Outpatient Encounter Medications as of 01/31/2022  ?Medication Sig  ? acetaminophen (TYLENOL) 500 MG tablet Take 500 mg by mouth every 6 (six) hours as needed.  ? Alum Hydroxide-Mag Carbonate 160-105 MG CHEW Chew 1 tablet by mouth as needed (acid reflux).  ? amoxicillin (AMOXIL) 500 MG capsule Take 500 mg by mouth 4 (four) times daily.  ? Cholecalciferol (SM VITAMIN D3) 100 MCG (4000 UT) CAPS Take 4,000 Units by mouth daily.  ? gabapentin (NEURONTIN) 300 MG capsule TAKE 1 CAPSULE BY MOUTH AT BEDTIME  ? IBUPROFEN PO Take by mouth.  ? loperamide (IMODIUM)  2 MG capsule Take by mouth as needed for diarrhea or loose stools.  ? solifenacin (VESICARE) 10 MG tablet Take 1 tablet (10 mg total) by mouth daily.  ? ?No facility-administered encounter medications on file as of 01/31/2022.  ? ? ?Patient Active Problem List  ? Diagnosis Date Noted  ? Estrogen deficiency 01/22/2022  ? Rhinorrhea 01/22/2022  ? Right arm pain 04/17/2021  ? Headache 04/17/2021  ? Fatigue 04/17/2021  ? Urge incontinence 01/22/2021  ? Pedal edema 01/22/2021  ? Thoracic back pain 10/23/2020  ? Mobility impaired 05/14/2020  ? Spinal stenosis of lumbar region 02/08/2020  ? Caregiver stress 02/08/2020  ? Urinary frequency 02/08/2020  ? Encounter for screening mammogram for breast cancer 08/24/2017  ? Parkinsonism ([place]) 08/24/2017  ? Routine general medical examination at a health care facility 08/08/2016  ? Left knee pain 02/01/2016  ? Total knee replacement status 10/15/2015  ? B12 deficiency 07/16/2015  ? Peripheral neuropathy 07/16/2015  ? Chorea 07/16/2015  ? Phalanx fracture, foot 01/10/2015  ? Colon cancer screening 09/22/2014  ? Osteoarthritis of left lower extremity 09/09/2014  ? History of falling 04/26/2014  ? Poor balance 04/26/2014  ? Encounter for Medicare annual wellness exam 07/13/2013  ? Risk for falls 07/13/2013  ? Skin tag of labia 03/14/2013  ? Weakness of left leg 07/30/2011  ? BACK PAIN, LUMBAR 12/10/2010  ? Vitamin D deficiency 08/16/2010  ? GERD 03/26/2010  ? DYSPEPSIA 03/26/2010  ? IRRITABLE BOWEL SYNDROME 03/26/2010  ? Osteoporosis 08/27/2009  ? Hypertriglyceridemia 07/24/2009  ? DIVERTICULOSIS, COLON 07/24/2009  ? OSTEOARTHRITIS  07/24/2009  ? COLONIC POLYPS, HX OF 07/24/2009  ? POSTMENOPAUSAL STATUS 07/24/2009  ? ? ?Conditions to be addressed/monitored: Anxiety and Osteoporosis; Limited social support, caregiver stress and Lacks knowledge of community resource:   ? ?Care Plan : LCSW Plan of Care  ?Updates made by Deirdre Peer, LCSW since 02/01/2022 12:00 AM  ?  ? ?Problem:  Caregiver Stress   ?  ? ?Long-Range Goal: Caregiver Coping Optimized   ?Start Date: 01/31/2022  ?Expected End Date: 04/29/2022  ?This Visit's Progress: On track  ?Priority: High  ?Note:   ?Current Barriers:  ?Limited social support, Social Isolation, and caregiver stress  ? ?CSW Clinical Goal(s):  ?Patient  will patient will work with SW to address concerns related to caregiver stress  through collaboration with Clinical Social Worker, provider, and care team.  ? ?Interventions: ?1:1 collaboration with primary care provider regarding development and update of comprehensive plan of care as evidenced by provider attestation and co-signature ?Inter-disciplinary care team collaboration (see longitudinal plan of care) ?Evaluation of current treatment plan related to  self management and patient's adherence to plan as established by provider ? ? ?Social Determinants of Health in Patient with Caregiver Stress:  (Status: New goal.) ?SDOH assessments completed: Stress ?Evaluation of current treatment plan related to Caregiver Stress ? ?Task & activities to accomplish goals: ?- begin personal counseling and/or attend support group ?Continue with compliance of taking medication  ?-consider additional in home caregiver support of husband ?-consider discussion with husband's PCP about wandering issues (medication to help?) ?- check out volunteer opportunities ?- join a support group ?- talk about feelings with a friend, family or spiritual advisor ?- practice positive thinking and self-talk  ? ? ? ?  ?  ?  ? ?Follow Up Plan: Appointment scheduled for SW follow up with client by phone on: 03/14/22 ?     ?Eduard Clos MSW, LCSW ?Licensed Clinical Social Worker ?[place]   ?[phone]  ? ? ?

## 2022-02-01 NOTE — Patient Instructions (Signed)
Visit Information  ? ?Thank you for taking time to visit with me today. Please don't hesitate to contact me if I can be of assistance to you before our next scheduled telephone appointment. ? ?Following are the goals we discussed today:  ?(Copy and paste patient goals from clinical care plan here) ? ?Our next appointment is by telephone on 03/14/22   ? ?Please call the care guide team at [phone] if you need to cancel or reschedule your appointment.  ? ?If you are experiencing a Mental Health or Omar or need someone to talk to, please call 911  ? ?Following is a copy of your full care plan:  ?Care Plan : [place]  ?Updates made by Deirdre Peer, LCSW since 02/01/2022 12:00 AM  ?  ? ?Problem: Caregiver Stress   ?  ? ?Long-Range Goal: Caregiver Coping Optimized   ?Start Date: 01/31/2022  ?Expected End Date: 04/29/2022  ?This Visit's Progress: On track  ?Priority: High  ?Note:   ?Current Barriers:  ?Limited social support, Social Isolation, and caregiver stress  ? ?CSW Clinical Goal(s):  ?Patient  will patient will work with SW to address concerns related to caregiver stress  through collaboration with Clinical Social Worker, provider, and care team.  ? ?Interventions:  ?CSW spoke with pt who reports she is caring for her husband who has dementia and has a lot of caregiver stress, isolation, etc.  ?Lengthy discussion with pt who does not want to consider moving her husband to a memory care unit; they live together in [facility] retirement community which does not offer higher levels of care. Pt also reports having some hired caregiver support (4-5 hours Monday-Friday) which helps- she recently has added over night care as well due to husband wandering and leaving their apartment unit.  ? ?1:1 collaboration with primary care provider regarding development and update of comprehensive plan of care as evidenced by provider attestation and co-signature ?Inter-disciplinary care team  collaboration (see longitudinal plan of care) ?Evaluation of current treatment plan related to  self management and patient's adherence to plan as established by provider ? ? ?Social Determinants of Health in Patient with Caregiver Stress:  (Status: New goal.) ?SDOH assessments completed: Stress ?Evaluation of current treatment plan related to Caregiver Stress ? ?Task & activities to accomplish goals: ?- begin personal counseling and/or attend support group ?Continue with compliance of taking medication  ?-consider additional in home caregiver support of husband ?-consider discussion with husband's PCP about wandering issues (medication to help?) ?- check out volunteer opportunities ?- join a support group ?- talk about feelings with a friend, family or spiritual advisor ?- practice positive thinking and self-talk  ? ? ? ?  ?  ? ? ?Consent to CCM Services: ?Ms. Tun was given information about Chronic Care Management services including:  ?CCM service includes personalized support from designated clinical staff supervised by her physician, including individualized plan of care and coordination with other care providers ?24/7 contact phone numbers for assistance for urgent and routine care needs. ?Service will only be billed when office clinical staff spend 20 minutes or more in a month to coordinate care. ?Only one practitioner may furnish and bill the service in a calendar month. ?The patient may stop CCM services at any time (effective at the end of the month) by phone call to the office staff. ?The patient will be responsible for cost sharing (co-pay) of up to 20% of the service fee (after annual deductible is  met). ? ?Patient agreed to services and verbal consent obtained.  ? ?The patient verbalized understanding of instructions, educational materials, and care plan provided today and declined offer to receive copy of patient instructions, educational materials, and care plan.  ? ?Telephone follow up appointment  with care management team member scheduled for:03/14/22 ? ? ?  ?

## 2022-02-18 ENCOUNTER — Encounter: Payer: Self-pay | Admitting: Family Medicine

## 2022-02-18 DIAGNOSIS — D649 Anemia, unspecified: Secondary | ICD-10-CM | POA: Insufficient documentation

## 2022-02-19 ENCOUNTER — Telehealth: Payer: Self-pay | Admitting: *Deleted

## 2022-02-19 ENCOUNTER — Other Ambulatory Visit: Payer: Self-pay | Admitting: *Deleted

## 2022-02-19 DIAGNOSIS — E538 Deficiency of other specified B group vitamins: Secondary | ICD-10-CM

## 2022-02-19 NOTE — Telephone Encounter (Signed)
? ?  Telephone encounter was:  Unsuccessful.  02/19/2022 ?Name: Julie [name] MRN: [id number] DOB: 1932-01-02 ? ?Unsuccessful outbound call made today to assist with:  Transportation Needs  ? ?Outreach Attempt:  2nd Attempt ? ?A HIPAA compliant voice message was left requesting a return call.  Instructed patient to call back at   Instructed patient to call back at [phone]  at their earliest convenience. . ? ?Charlyne Robertshaw Greenauer -Selinda Eon ?Care Guide , Embedded Care Coordination ?[place], Care Management  ?[phone] ?[address] , [place] [address] ?Email : Ashby [email or web address] ?  ?

## 2022-02-20 ENCOUNTER — Telehealth: Payer: Self-pay | Admitting: *Deleted

## 2022-02-20 NOTE — Telephone Encounter (Signed)
? ?  Telephone encounter was:  Successful.  ?02/20/2022 ?Name: [name] MRN: [id number] DOB: 21-May-1932 ? ?Julie [name] is a 88 y.o. year old female who is a primary care patient of Tower, [name], MD . The community resource team was consulted for assistance with Transportation Needs  ? ?Care guide performed the following interventions: Patient provided with information about care guide support team and interviewed to confirm resource needs. ? ?Follow Up Plan:  Would like to take B12 shots provided transportation options as far her insurance provided the phone numbers to book the transportaion as she requested . she has not missed any appts just does not have transportion for these additional needed injections  ? ?Lovett Sox -Selinda Eon ?Care Guide , Embedded Care Coordination ?[place], Care Management  ?[phone] ?[address] , [place] [address] ?Email : Ashby Dawes. Greenauer-moran [email or web address] ?  ?

## 2022-02-27 ENCOUNTER — Ambulatory Visit: Payer: Medicare Other

## 2022-02-28 DIAGNOSIS — M810 Age-related osteoporosis without current pathological fracture: Secondary | ICD-10-CM

## 2022-03-05 ENCOUNTER — Ambulatory Visit (INDEPENDENT_AMBULATORY_CARE_PROVIDER_SITE_OTHER): Payer: Medicare HMO

## 2022-03-05 ENCOUNTER — Telehealth (INDEPENDENT_AMBULATORY_CARE_PROVIDER_SITE_OTHER): Payer: Medicare HMO | Admitting: Family Medicine

## 2022-03-05 DIAGNOSIS — E538 Deficiency of other specified B group vitamins: Secondary | ICD-10-CM | POA: Diagnosis not present

## 2022-03-05 DIAGNOSIS — D649 Anemia, unspecified: Secondary | ICD-10-CM | POA: Diagnosis not present

## 2022-03-05 MED ORDER — CYANOCOBALAMIN 1000 MCG/ML IJ SOLN
1000.0000 ug | Freq: Once | INTRAMUSCULAR | Status: AC
  Administered 2022-03-05: 1000 ug via INTRAMUSCULAR

## 2022-03-05 NOTE — Addendum Note (Signed)
Addended by: Ellamae Sia on: 03/05/2022 04:14 PM ? ? Modules accepted: Orders ? ?

## 2022-03-05 NOTE — Telephone Encounter (Signed)
Lab orders in for today  ?Would like her to get a b12 shot weekly for 4 weeks then a b12 level in 3 months as well  ?Thanks  ?

## 2022-03-05 NOTE — Progress Notes (Signed)
Per orders of Dr. Glori Bickers, injection of b12 given by Loreen Freud. ?Patient tolerated injection well.  ?

## 2022-03-06 LAB — CBC WITH DIFFERENTIAL/PLATELET
Absolute Monocytes: 475 cells/uL (ref 200–950)
Basophils Absolute: 40 cells/uL (ref 0–200)
Basophils Relative: 0.6 %
Eosinophils Absolute: 59 cells/uL (ref 15–500)
Eosinophils Relative: 0.9 %
HCT: 32 % — ABNORMAL LOW (ref 35.0–45.0)
Hemoglobin: 10.4 g/dL — ABNORMAL LOW (ref 11.7–15.5)
Lymphs Abs: 1406 cells/uL (ref 850–3900)
MCH: 27.2 pg (ref 27.0–33.0)
MCHC: 32.5 g/dL (ref 32.0–36.0)
MCV: 83.6 fL (ref 80.0–100.0)
MPV: 10 fL (ref 7.5–12.5)
Monocytes Relative: 7.2 %
Neutro Abs: 4620 cells/uL (ref 1500–7800)
Neutrophils Relative %: 70 %
Platelets: 282 10*3/uL (ref 140–400)
RBC: 3.83 10*6/uL (ref 3.80–5.10)
RDW: 13.5 % (ref 11.0–15.0)
Total Lymphocyte: 21.3 %
WBC: 6.6 10*3/uL (ref 3.8–10.8)

## 2022-03-06 LAB — FERRITIN: Ferritin: 7 ng/mL — ABNORMAL LOW (ref 10.0–291.0)

## 2022-03-06 LAB — IRON: Iron: 43 ug/dL (ref 42–145)

## 2022-03-06 LAB — PATHOLOGIST SMEAR REVIEW

## 2022-03-06 NOTE — Telephone Encounter (Signed)
Left message to call office. Anyone can help her if she calls back. Thanks. ?

## 2022-03-06 NOTE — Telephone Encounter (Signed)
Pt scheduled for next weekly B12. She will schedule each b12 as she goes. She also asked to wait till she is in the office to schedule her 3 mo lab visit.  ?

## 2022-03-07 ENCOUNTER — Encounter: Payer: Self-pay | Admitting: Family Medicine

## 2022-03-07 DIAGNOSIS — D509 Iron deficiency anemia, unspecified: Secondary | ICD-10-CM | POA: Insufficient documentation

## 2022-03-12 ENCOUNTER — Ambulatory Visit (INDEPENDENT_AMBULATORY_CARE_PROVIDER_SITE_OTHER): Payer: Medicare HMO

## 2022-03-12 ENCOUNTER — Telehealth: Payer: Self-pay | Admitting: *Deleted

## 2022-03-12 DIAGNOSIS — E538 Deficiency of other specified B group vitamins: Secondary | ICD-10-CM

## 2022-03-12 MED ORDER — CYANOCOBALAMIN 1000 MCG/ML IJ SOLN
1000.0000 ug | Freq: Once | INTRAMUSCULAR | Status: AC
  Administered 2022-03-12: 1000 ug via INTRAMUSCULAR

## 2022-03-12 MED ORDER — POLYSACCHARIDE IRON COMPLEX 150 MG PO CAPS: 150.0000 mg | ORAL_CAPSULE | Freq: Every day | ORAL | 1 refills | Status: DC

## 2022-03-12 NOTE — Progress Notes (Signed)
Per orders of Dr. Glori Bickers, 2 of 4 weekly injection of b12 given by Loreen Freud. ?Patient tolerated injection well.  ?

## 2022-03-12 NOTE — Telephone Encounter (Signed)
-----   Message from Abner Greenspan, MD sent at 03/07/2022 11:22 AM EDT ----- ?Anemia is fairly stable and appears to be iron deficient  ?She has been anemic in the past and never knew why  ?If she would like to see GI to rule out a GI cause we can, this is up to her ?Let me know if any abd pain or blood in stool  ?Please send in niferex 150 mg 1 po qd #30 1 refill  ? ?Re check cbc, iron and ferritin in 1 mo ? ?This can constipate/ take a stool softener  ?

## 2022-03-14 ENCOUNTER — Telehealth: Payer: Medicare Other

## 2022-03-20 ENCOUNTER — Ambulatory Visit (INDEPENDENT_AMBULATORY_CARE_PROVIDER_SITE_OTHER): Payer: Medicare HMO

## 2022-03-20 DIAGNOSIS — E538 Deficiency of other specified B group vitamins: Secondary | ICD-10-CM | POA: Diagnosis not present

## 2022-03-20 MED ORDER — CYANOCOBALAMIN 1000 MCG/ML IJ SOLN
1000.0000 ug | Freq: Once | INTRAMUSCULAR | Status: AC
  Administered 2022-03-20: 1000 ug via INTRAMUSCULAR

## 2022-03-20 NOTE — Progress Notes (Signed)
Per orders of Dr. Letvak, injection of vit B12 given by Lamyah Creed. Patient tolerated injection well.  

## 2022-03-21 ENCOUNTER — Telehealth: Payer: Medicare Other

## 2022-03-27 ENCOUNTER — Ambulatory Visit (INDEPENDENT_AMBULATORY_CARE_PROVIDER_SITE_OTHER): Payer: Medicare HMO

## 2022-03-27 DIAGNOSIS — E538 Deficiency of other specified B group vitamins: Secondary | ICD-10-CM | POA: Diagnosis not present

## 2022-03-27 MED ORDER — CYANOCOBALAMIN 1000 MCG/ML IJ SOLN
1000.0000 ug | Freq: Once | INTRAMUSCULAR | Status: AC
  Administered 2022-03-27: 1000 ug via INTRAMUSCULAR

## 2022-03-27 NOTE — Progress Notes (Signed)
Per orders of Dr. Duncan, injection of vit B12 given by Javaya [place]. Patient tolerated injection well.  

## 2022-03-31 ENCOUNTER — Encounter: Payer: Self-pay | Admitting: Family Medicine

## 2022-03-31 ENCOUNTER — Ambulatory Visit (INDEPENDENT_AMBULATORY_CARE_PROVIDER_SITE_OTHER): Payer: Medicare HMO | Admitting: Family Medicine

## 2022-03-31 VITALS — BP 118/78 | HR 66 | Ht 62.0 in | Wt 175.0 lb

## 2022-03-31 DIAGNOSIS — F419 Anxiety disorder, unspecified: Secondary | ICD-10-CM

## 2022-03-31 DIAGNOSIS — Z636 Dependent relative needing care at home: Secondary | ICD-10-CM

## 2022-03-31 DIAGNOSIS — E538 Deficiency of other specified B group vitamins: Secondary | ICD-10-CM

## 2022-03-31 DIAGNOSIS — F32A Depression, unspecified: Secondary | ICD-10-CM

## 2022-03-31 DIAGNOSIS — D509 Iron deficiency anemia, unspecified: Secondary | ICD-10-CM

## 2022-03-31 LAB — CBC WITH DIFFERENTIAL/PLATELET
Basophils Absolute: 0 10*3/uL (ref 0.0–0.1)
Basophils Relative: 0.7 % (ref 0.0–3.0)
Eosinophils Absolute: 0.1 10*3/uL (ref 0.0–0.7)
Eosinophils Relative: 1.3 % (ref 0.0–5.0)
HCT: 34.1 % — ABNORMAL LOW (ref 36.0–46.0)
Hemoglobin: 11.3 g/dL — ABNORMAL LOW (ref 12.0–15.0)
Lymphocytes Relative: 19.6 % (ref 12.0–46.0)
Lymphs Abs: 1.1 10*3/uL (ref 0.7–4.0)
MCHC: 33.2 g/dL (ref 30.0–36.0)
MCV: 86.1 fl (ref 78.0–100.0)
Monocytes Absolute: 0.4 10*3/uL (ref 0.1–1.0)
Monocytes Relative: 7 % (ref 3.0–12.0)
Neutro Abs: 4.2 10*3/uL (ref 1.4–7.7)
Neutrophils Relative %: 71.4 % (ref 43.0–77.0)
Platelets: 276 10*3/uL (ref 150.0–400.0)
RBC: 3.96 Mil/uL (ref 3.87–5.11)
RDW: 15 % (ref 11.5–15.5)
WBC: 5.8 10*3/uL (ref 4.0–10.5)

## 2022-03-31 LAB — FERRITIN: Ferritin: 7.7 ng/mL — ABNORMAL LOW (ref 10.0–291.0)

## 2022-03-31 LAB — IRON: Iron: 45 ug/dL (ref 42–145)

## 2022-03-31 LAB — VITAMIN B12: Vitamin B-12: 976 pg/mL — ABNORMAL HIGH (ref 211–911)

## 2022-03-31 MED ORDER — DULOXETINE HCL 30 MG PO CPEP: 30.0000 mg | ORAL_CAPSULE | Freq: Every day | ORAL | 3 refills | Status: DC

## 2022-03-31 NOTE — Progress Notes (Signed)
? ?Subjective:  ? ? Patient ID: Julie [name], female    DOB: 1932-03-05, 88 y.o.   MRN: [id number] ? ?HPI ?Pt presents with c/o anxiety  ? ?Wt Readings from Last 3 Encounters:  ?03/31/22 175 lb (79.4 kg)  ?01/22/22 174 lb (78.9 kg)  ?04/17/21 178 lb (80.7 kg)  ? ?32.01 kg/m? ? ? ?Husband is going into a nursing home (fell and broke hip and had surgery and was in [facility] -got covid and pna) - at [facility] and will go to memory care  ?Has been caring for him for years  ?Daughter is here today her  ? ?She is home by herself  ?More anxious  ?Trouble sleeping /worries about him  ?Most trouble falling asleep  ?Then cannot go back to sleep after going to the bathroom  ? ? ? ?Feels tearful/can't quit crying  ?Has to move  ?Has to deal with finances  ?Just as much down as anxious  ?Worse at night/ feels fearful  ? ? ?Will have to down size to one bedroom in retirement home ?Has a lot of people to talk to  ?Has a good friend - who had to go to rehab after surgery/ talks to her on the phone  ? ?Has a caregiver during the week  ? ? ?Past hx:  cymbalta  ? ?Takes gabapentin 300 mg at bedtime for nerve pain  ?Melatonin  ? ?PHQ today score of 16  ?Social work was consulted  ? ? ?  03/31/2022  ? 10:27 AM 05/24/2021  ?  9:07 AM 04/11/2020  ? 11:18 AM 02/08/2020  ?  2:39 PM 08/24/2017  ?  2:34 PM  ?Depression screen PHQ 2/9  ?Decreased Interest 3 0 3 1 0  ?Down, Depressed, Hopeless '3 1 3 1 '$ 0  ?PHQ - 2 Score '6 1 6 2 '$ 0  ?Altered sleeping 3 1 0 0   ?Tired, decreased energy 3 0 0 3   ?Change in appetite 0 0 0 0   ?Feeling bad or failure about yourself  0 0 0 1   ?Trouble concentrating 3 0 0 0   ?Moving slowly or fidgety/restless 1 0 0 0   ?Suicidal thoughts 0 0 0 0   ?PHQ-9 Score '16 2 6 6   '$ ?Difficult doing work/chores Somewhat difficult Somewhat difficult Somewhat difficult    ? ? ?  03/31/2022  ? 10:29 AM  ?GAD 7 : Generalized Anxiety Score  ?Nervous, Anxious, on Edge 1  ?Control/stop worrying 1  ?Worry too much - different things  2  ?Trouble relaxing 2  ?Restless 0  ?Easily annoyed or irritable 1  ?Afraid - awful might happen 2  ?Total GAD 7 Score 9  ?Anxiety Difficulty Very difficult  ? ? ? ? ? ?Taking iron for iron def anemia  ?B12 shots for low b12 ? ?Lab Results  ?Component Value Date  ? WBC 6.6 03/05/2022  ? HGB 10.4 (L) 03/05/2022  ? HCT 32.0 (L) 03/05/2022  ? MCV 83.6 03/05/2022  ? PLT 282 03/05/2022  ? ?Lab Results  ?Component Value Date  ? IRON 43 03/05/2022  ? FERRITIN 7.0 (L) 03/05/2022  ? ?Lab Results  ?Component Value Date  ? VITAMINB12 84 (L) 01/22/2022  ? ?4 B12 shots  ? ?1000 mcg B12 daily  ? ? ?Patient Active Problem List  ? Diagnosis Date Noted  ? Anxiety and depression 03/31/2022  ? Iron deficiency anemia 03/07/2022  ? Anemia, unspecified 02/18/2022  ? Estrogen deficiency  01/22/2022  ? Rhinorrhea 01/22/2022  ? Right arm pain 04/17/2021  ? Headache 04/17/2021  ? Fatigue 04/17/2021  ? Urge incontinence 01/22/2021  ? Pedal edema 01/22/2021  ? Thoracic back pain 10/23/2020  ? Mobility impaired 05/14/2020  ? Spinal stenosis of lumbar region 02/08/2020  ? Caregiver stress 02/08/2020  ? Urinary frequency 02/08/2020  ? Encounter for screening mammogram for breast cancer 08/24/2017  ? Parkinsonism ([place]) 08/24/2017  ? Routine general medical examination at a health care facility 08/08/2016  ? Left knee pain 02/01/2016  ? Total knee replacement status 10/15/2015  ? B12 deficiency 07/16/2015  ? Peripheral neuropathy 07/16/2015  ? Chorea 07/16/2015  ? Phalanx fracture, foot 01/10/2015  ? Colon cancer screening 09/22/2014  ? Osteoarthritis of left lower extremity 09/09/2014  ? History of falling 04/26/2014  ? Poor balance 04/26/2014  ? Encounter for Medicare annual wellness exam 07/13/2013  ? Risk for falls 07/13/2013  ? Skin tag of labia 03/14/2013  ? Weakness of left leg 07/30/2011  ? BACK PAIN, LUMBAR 12/10/2010  ? Vitamin D deficiency 08/16/2010  ? GERD 03/26/2010  ? DYSPEPSIA 03/26/2010  ? IRRITABLE BOWEL SYNDROME 03/26/2010  ?  Osteoporosis 08/27/2009  ? Hypertriglyceridemia 07/24/2009  ? DIVERTICULOSIS, COLON 07/24/2009  ? OSTEOARTHRITIS 07/24/2009  ? COLONIC POLYPS, HX OF 07/24/2009  ? POSTMENOPAUSAL STATUS 07/24/2009  ? ?Past Medical History:  ?Diagnosis Date  ? Adenoma   ? Chronic leg pain   ? Colon polyp   ? DDD (degenerative disc disease), cervical   ? Diverticulosis of colon   ? internal----Dr. Vira Agar  ? Duodenitis   ? Esophagitis   ? GERD (gastroesophageal reflux disease)   ? Hemorrhoids   ? HLD (hyperlipidemia)   ? IBS (irritable bowel syndrome)   ? with PP diarrhea  ? Neuropathy   ? Neuropathy   ? OA (osteoarthritis) of knee   ? Osteopenia   ? PONV (postoperative nausea and vomiting)   ? Scoliosis   ? Seborrheic dermatitis   ? Shortness of breath dyspnea   ? with activity  ? TMJ syndrome   ? Trochanteric bursitis   ? Unsteady gait   ? FALLS EASILY  ? ?Past Surgical History:  ?Procedure Laterality Date  ? CARPAL TUNNEL RELEASE Left 01/18/2013  ? Procedure: CARPAL TUNNEL RELEASE;  Surgeon: Wynonia Sours, MD;  Location: [facility];  Service: Orthopedics;  Laterality: Left;  OSTEOTOMY LEFT DISTAL RADIUS BONE CHIPS CARPAL TUNNEL RELEASE LEFT   ? CATARACT EXTRACTION W/PHACO Right 08/07/2015  ? Procedure: CATARACT EXTRACTION PHACO AND INTRAOCULAR LENS PLACEMENT (IOC);  Surgeon: Birder Robson, MD;  Location: [facility];  Service: Ophthalmology;  Laterality: Right;  [place] 00:48.0 ?AP   22.3 ?CDE 10.69 ?casette lot # [id number]  ? CATARACT EXTRACTION W/PHACO Left 09/04/2015  ? Procedure: CATARACT EXTRACTION PHACO AND INTRAOCULAR LENS PLACEMENT (IOC);  Surgeon: Birder Robson, MD;  Location: [facility];  Service: Ophthalmology;  Laterality: Left;  [place] ?AP ?CDE ?FLUID LOT # [id number]  ? CEREBRAL ANEURYSM REPAIR  2001  ? clamps  ? COLONOSCOPY  1/11  ? polyps-hyperplastic and adenomatous  ? FOOT SURGERY  2010  ? bilateral hammer toes and "knot"  ? FRACTURE SURGERY Right 01/2014  ? result of a fall  ? FRACTURE SURGERY Left 03/2014  ? result  of a fall  ? HEMORRHOID SURGERY    ? KNEE ARTHROPLASTY Left 10/15/2015  ? Procedure: COMPUTER ASSISTED TOTAL KNEE ARTHROPLASTY;  Surgeon: Dereck Leep, MD;  Location: [facility];  Service:  Orthopedics;  Laterality: Left;  ? OPEN REDUCTION INTERNAL FIXATION (ORIF) DISTAL RADIAL FRACTURE Right 02/13/2015  ? Procedure: OPEN REDUCTION INTERNAL FIXATION (ORIF) RIGHT DISTAL RADIUS;  Surgeon: Daryll Brod, MD;  Location: [facility];  Service: Orthopedics;  Laterality: Right;  ? OPEN REDUCTION INTERNAL FIXATION (ORIF) FINGER WITH RADIAL BONE GRAFT Left 11/01/2013  ? Procedure: OPEN REDUCTION INTERNAL FIXATION (ORIF) LEFT SMALL FINGER;  Surgeon: Wynonia Sours, MD;  Location: [facility];  Service: Orthopedics;  Laterality: Left;  ? REPLACEMENT TOTAL KNEE Left   ? TOTAL ABDOMINAL HYSTERECTOMY    ? WRIST OSTEOTOMY Left 01/18/2013  ? Procedure: WRIST OSTEOTOMY;  Surgeon: Wynonia Sours, MD;  Location: [facility];  Service: Orthopedics;  Laterality: Left;  ? ?Social History  ? ?Tobacco Use  ? Smoking status: Former  ?  Types: Cigarettes  ?  Quit date: 10/02/1954  ?  Years since quitting: 67.5  ? Smokeless tobacco: Never  ?Vaping Use  ? Vaping Use: Never used  ?Substance Use Topics  ? Alcohol use: No  ?  Alcohol/week: 0.0 standard drinks  ? Drug use: No  ? ?Family History  ?Problem Relation Age of Onset  ? Colon cancer Mother   ? Osteoporosis Other   ?     hip fracture  ? Stroke Brother   ? Colon cancer Brother   ? Hypertension Brother   ? Other Brother   ?     Heart problem  ? Colon cancer Brother   ? Colon cancer Maternal Aunt   ? Breast cancer Neg Hx   ? ?Allergies  ?Allergen Reactions  ? Codeine   ?  REACTION: headache and nausea and vomiting  ? ?Current Outpatient Medications on File Prior to Visit  ?Medication Sig Dispense Refill  ? acetaminophen (TYLENOL) 500 MG tablet Take 500 mg by mouth every 6 (six) hours as needed.    ? Alum Hydroxide-Mag Carbonate 160-105 MG CHEW Chew 1 tablet by  mouth as needed (acid reflux).    ? amoxicillin (AMOXIL) 500 MG capsule Take 500 mg by mouth 4 (four) times daily.    ? Cholecalciferol (SM VITAMIN D3) 100 MCG (4000 UT) CAPS Take 4,000 Units by mouth dail

## 2022-03-31 NOTE — Patient Instructions (Addendum)
I will reach out to social work to get some options for counseling  ? ?Take care of yourself  ?Eat regular meals and drink fluids  ? ?Start cymbalta 30 mg once daily ?If any intolerable side effects for if you feel worse instead of better, stop the medicine and let us know  ? ?This is for depression and anxiety and helps pain also  ? ?Keep [place] posted  ? ?Follow up with me in 1-2 months  ? ?Labs today for anemia and B12  ? ? ? ? ? ? ?

## 2022-03-31 NOTE — Assessment & Plan Note (Signed)
Husband is now in nsg facility ?

## 2022-03-31 NOTE — Assessment & Plan Note (Signed)
In setting of stress reaction (husb in nsg home with alz and poor health, no longer able to care for him) ?Enormous worry, unable to sleep and tearful  ?Reviewed stressors/ coping techniques/symptoms/ support sources/ tx options and side effects in detail today ?Helpful daughter here today  ?Is open to the idea of counseling by phone ?Px cymbalta (on this years ago and tol well) 30 mg daily  ?Discussed expectations of SNRI medication including time to effectiveness and mechanism of action, also poss of side effects (early and late)- including mental fuzziness, weight or appetite change, nausea and poss of worse dep or anxiety (even suicidal thoughts)  Pt voiced understanding and will stop med and update if this occurs   ?Given handouts  ?Will update if any problems ?F/u planned 1--2 mo  ?Has good support  ?Will reach out to social work re: counseling opt ?

## 2022-03-31 NOTE — Assessment & Plan Note (Signed)
Taking niferex ?Dislikes it  ?Not ready for GI eval ? ? ?Lab today ?

## 2022-03-31 NOTE — Assessment & Plan Note (Signed)
4 shots  ?On 1000 mcg daily oral  ? ?Level today  ?

## 2022-04-04 ENCOUNTER — Telehealth: Payer: Self-pay | Admitting: Family Medicine

## 2022-04-04 NOTE — Telephone Encounter (Signed)
Pt aware and said she has already stopped and she will call back next week with update ?

## 2022-04-04 NOTE — Telephone Encounter (Signed)
Please stop it  ?Update me with how she is doing next week  ?

## 2022-04-04 NOTE — Telephone Encounter (Signed)
Pt called back about DULoxetine (CYMBALTA) 30 MG capsule, she said she was told to call back to let us know how shes doing on it. She said it makes her dizzy headed and last night she fell and she had to get the rescue unit to come get her up. She wasn't sure if you wanted her to continue taking it or not. Please advise call back is [phone] ?

## 2022-04-04 NOTE — Telephone Encounter (Signed)
Left VM requesting pt to call the office back 

## 2022-04-07 ENCOUNTER — Ambulatory Visit (INDEPENDENT_AMBULATORY_CARE_PROVIDER_SITE_OTHER): Payer: Medicare HMO | Admitting: *Deleted

## 2022-04-07 DIAGNOSIS — R5382 Chronic fatigue, unspecified: Secondary | ICD-10-CM

## 2022-04-07 DIAGNOSIS — F419 Anxiety disorder, unspecified: Secondary | ICD-10-CM

## 2022-04-07 NOTE — Chronic Care Management (AMB) (Signed)
?Chronic Care Management  ? ? Clinical Social Work Note ? ?04/07/2022 ?Name: Julie [name] MRN: [id number] DOB: [birth year] ? ?Julie [name] is a 88 y.o. year old female who is a primary care patient of Tower, [name], MD. The CCM team was consulted to assist the patient with chronic disease management and/or care coordination needs related to: [employer] , Mental Health Counseling and Resources, and Caregiver Stress.  ? ?Engaged with patient by telephone for follow up visit in response to provider referral for social work chronic care management and care coordination services.  ? ?Consent to Services:  ?The patient was given information about Chronic Care Management services, agreed to services, and gave verbal consent prior to initiation of services.  Please see initial visit note for detailed documentation.  ? ?Patient agreed to services and consent obtained.  ? ?Assessment: Review of patient past medical history, allergies, medications, and health status, including review of relevant consultants reports was performed today as part of a comprehensive evaluation and provision of chronic care management and care coordination services.    ? ?SDOH (Social Determinants of Health) assessments and interventions performed:  ?SDOH Interventions   ? ?Flowsheet Row Most Recent Value  ?SDOH Interventions   ?Depression Interventions/Treatment  Medication, Counseling, Patient refuses Treatment  ? ?  ?  ? ?Advanced Directives Status: Not addressed in this encounter. ? ?CCM Care Plan ? ?Allergies  ?Allergen Reactions  ? Codeine   ?  REACTION: headache and nausea and vomiting  ? ? ?Outpatient Encounter Medications as of 04/07/2022  ?Medication Sig  ? acetaminophen (TYLENOL) 500 MG tablet Take 500 mg by mouth every 6 (six) hours as needed.  ? Alum Hydroxide-Mag Carbonate 160-105 MG CHEW Chew 1 tablet by mouth as needed (acid reflux).  ? amoxicillin (AMOXIL) 500 MG capsule Take 500 mg by mouth 4 (four) times daily.  ?  Cholecalciferol (SM VITAMIN D3) 100 MCG (4000 UT) CAPS Take 4,000 Units by mouth daily.  ? DULoxetine (CYMBALTA) 30 MG capsule Take 1 capsule (30 mg total) by mouth daily.  ? gabapentin (NEURONTIN) 300 MG capsule TAKE 1 CAPSULE BY MOUTH AT BEDTIME  ? IBUPROFEN PO Take by mouth.  ? iron polysaccharides (NIFEREX) 150 MG capsule Take 1 capsule (150 mg total) by mouth daily.  ? loperamide (IMODIUM) 2 MG capsule Take by mouth as needed for diarrhea or loose stools.  ? solifenacin (VESICARE) 10 MG tablet Take 1 tablet (10 mg total) by mouth daily.  ? ?No facility-administered encounter medications on file as of 04/07/2022.  ? ? ?Patient Active Problem List  ? Diagnosis Date Noted  ? Anxiety and depression 03/31/2022  ? Iron deficiency anemia 03/07/2022  ? Anemia, unspecified 02/18/2022  ? Estrogen deficiency 01/22/2022  ? Rhinorrhea 01/22/2022  ? Right arm pain 04/17/2021  ? Headache 04/17/2021  ? Fatigue 04/17/2021  ? Urge incontinence 01/22/2021  ? Pedal edema 01/22/2021  ? Thoracic back pain 10/23/2020  ? Mobility impaired 05/14/2020  ? Spinal stenosis of lumbar region 02/08/2020  ? Caregiver stress 02/08/2020  ? Urinary frequency 02/08/2020  ? Encounter for screening mammogram for breast cancer 08/24/2017  ? Parkinsonism ([place]) 08/24/2017  ? Routine general medical examination at a health care facility 08/08/2016  ? Left knee pain 02/01/2016  ? Total knee replacement status 10/15/2015  ? B12 deficiency 07/16/2015  ? Peripheral neuropathy 07/16/2015  ? Chorea 07/16/2015  ? Phalanx fracture, foot 01/10/2015  ? Colon cancer screening 09/22/2014  ? Osteoarthritis of left lower extremity  09/09/2014  ? History of falling 04/26/2014  ? Poor balance 04/26/2014  ? Encounter for Medicare annual wellness exam 07/13/2013  ? Risk for falls 07/13/2013  ? Skin tag of labia 03/14/2013  ? Weakness of left leg 07/30/2011  ? BACK PAIN, LUMBAR 12/10/2010  ? Vitamin D deficiency 08/16/2010  ? GERD 03/26/2010  ? DYSPEPSIA 03/26/2010  ?  IRRITABLE BOWEL SYNDROME 03/26/2010  ? Osteoporosis 08/27/2009  ? Hypertriglyceridemia 07/24/2009  ? DIVERTICULOSIS, COLON 07/24/2009  ? OSTEOARTHRITIS 07/24/2009  ? COLONIC POLYPS, HX OF 07/24/2009  ? POSTMENOPAUSAL STATUS 07/24/2009  ? ? ?Conditions to be addressed/monitored: Anxiety and Depression; Mental Health Concerns  and Limited access to caregiver ? ?Care Plan : LCSW Plan of Care  ?Updates made by Deirdre Peer, LCSW since 04/07/2022 12:00 AM  ?  ? ?Problem: Caregiver Stress   ?  ? ?Long-Range Goal: Caregiver Coping Optimized   ?Start Date: 01/31/2022  ?Expected End Date: 04/29/2022  ?This Visit's Progress: On track  ?Recent Progress: On track  ?Priority: High  ?Note:   ?Current Barriers:  ?Limited social support, Social Isolation, and caregiver stress  ? ?CSW Clinical Goal(s):  ?Patient  will patient will work with SW to address concerns related to caregiver stress  through collaboration with Clinical Social Worker, provider, and care team.  ? ?Interventions:  ?CSW spoke with pt who reports  her husband is at [facility] rehab post hip fx and COVID/pneumonia. She is anticipating long term placement in a memory care. Pt reports her main issue related to her self (anxiety/depression) seems to be "fear of being alone- especially at night".... Pt has some in home care/support 10:30-2:30  but not overnight. She was recently started on RX to help her sleep but she indicates it caused dizziness and she actually had a fall one night- "got up to go to the bathroom and fell- had to get EMS to help her up".  Pt has a life alert button she wears.  Discussed with pt that without someone actually staying with her overnight; which would be expensive, not sure what we can do.  Even in an ALF community she will be alone at night although staff in building. Pt is considering a move once her husband gets placed in memory care in hopes of being close(same community). CSW will send her some relaxation/anxiety info to review and  try. ? ?1:1 collaboration with primary care provider regarding development and update of comprehensive plan of care as evidenced by provider attestation and co-signature ?Inter-disciplinary care team collaboration (see longitudinal plan of care) ?Evaluation of current treatment plan related to  self management and patient's adherence to plan as established by provider ? ? ?Social Determinants of Health in Patient with Caregiver Stress:  (Status: New goal.) ?SDOH assessments completed: Stress ?Evaluation of current treatment plan related to Caregiver Stress ? ?Task & activities to accomplish goals: ?- begin personal counseling and/or attend support group ?Continue with compliance of taking medication  ?-consider additional in home care support   ?- check out volunteer opportunities ?- join a support group ?- talk about feelings with a friend, family or spiritual advisor ?- practice positive thinking and self-talk  ? ? ? ?  ?  ?  ? ?Follow Up Plan: Appointment scheduled for SW follow up with client by phone on: 04/18/22 ?     ?Eduard Clos MSW, LCSW ?Licensed Clinical Social Worker ?[place]   ?[phone]  ? ? ?

## 2022-04-07 NOTE — Telephone Encounter (Signed)
Not dizzy, but head doesn't feel right ? ?Stopped taking the medication 5.1.23 ? ? ?

## 2022-04-08 ENCOUNTER — Ambulatory Visit
Admission: RE | Admit: 2022-04-08 | Discharge: 2022-04-08 | Disposition: A | Discharge status: Home / Self Care | Payer: Medicare HMO | Source: Ambulatory Visit | Attending: Family Medicine | Admitting: Family Medicine

## 2022-04-08 DIAGNOSIS — Z780 Asymptomatic menopausal state: Secondary | ICD-10-CM | POA: Diagnosis not present

## 2022-04-08 DIAGNOSIS — E2839 Other primary ovarian failure: Secondary | ICD-10-CM | POA: Insufficient documentation

## 2022-04-08 DIAGNOSIS — M810 Age-related osteoporosis without current pathological fracture: Secondary | ICD-10-CM | POA: Diagnosis not present

## 2022-04-08 NOTE — Patient Instructions (Signed)
Visit Information ? ?Thank you for taking time to visit with me today. Please don't hesitate to contact me if I can be of assistance to you before our next scheduled telephone appointment. ? ? ?Our next appointment is by telephone on 04/18/22 ?Please call the care guide team at [phone] if you need to cancel or reschedule your appointment.  ? ?If you are experiencing a Mental Health or Behavioral Health Crisis or need someone to talk to, please call the Suicide and Crisis Lifeline: 988 ?call 911  ? ?The patient verbalized understanding of instructions, educational materials, and care plan provided today and declined offer to receive copy of patient instructions, educational materials, and care plan.  ? ?Unknown Flannigan MSW, LCSW ?Licensed Clinical Social Worker ?[facility] [place]   ?[phone]  ?

## 2022-04-08 NOTE — Telephone Encounter (Signed)
Glad the dizziness is gone.  Please elaborate about head symptoms.  Any pain ? Did she hit her head originally?  ?Nausea or other symptoms? ?

## 2022-04-09 ENCOUNTER — Telehealth: Payer: Self-pay

## 2022-04-09 ENCOUNTER — Ambulatory Visit: Payer: Medicare Other

## 2022-04-09 NOTE — Telephone Encounter (Signed)
-----   Message from Abner Greenspan, MD sent at 04/08/2022  6:50 PM EDT ----- ?Osteoporosis is worse than last check  ?I want to start therapy with weekly fosamax (alendronate)  ?We discussed this at an appt earlier  ?Need to wait until dental work is done-please update me re: that status  ? ?Continue supplements  ?Exercise as tolerated helps also  ?

## 2022-04-09 NOTE — Telephone Encounter (Signed)
Called and lvm for patient call back regarding dexa results. ?

## 2022-04-09 NOTE — Telephone Encounter (Signed)
That is reassuring.  Give it another week or so to make sure she feels better and follow up if needed.  At that time if she wants to try something else for anxiety/depression please let me know ?

## 2022-04-09 NOTE — Telephone Encounter (Signed)
Called and spoke with patient she said that she still not having  any dizziness, no nausea or vomiting. She said that she just felt light head  but has gone away since she as not taken the medication. She said that she is feeling better.  ?

## 2022-04-09 NOTE — Telephone Encounter (Signed)
Called and lvm

## 2022-04-10 ENCOUNTER — Telehealth: Payer: Self-pay | Admitting: Family Medicine

## 2022-04-10 DIAGNOSIS — E538 Deficiency of other specified B group vitamins: Secondary | ICD-10-CM

## 2022-04-10 DIAGNOSIS — D649 Anemia, unspecified: Secondary | ICD-10-CM

## 2022-04-10 NOTE — Telephone Encounter (Signed)
-----   Message from Velna Hatchet, RT sent at 03/24/2022  9:50 AM EDT ----- ?Regarding: Lab Fri 04/11/22 ?Patient has lab appt on 04/11/22.  There are labs ordered for B12, from 08/15/2019. Verifying if additional labs are needed or not.  Thanks, Anda Kraft ? ?

## 2022-04-11 ENCOUNTER — Other Ambulatory Visit: Payer: Medicare Other

## 2022-04-15 ENCOUNTER — Telehealth: Payer: Self-pay | Admitting: Family Medicine

## 2022-04-15 MED ORDER — ALENDRONATE SODIUM 70 MG PO TABS: 70.0000 mg | ORAL_TABLET | ORAL | 11 refills | Status: DC

## 2022-04-15 NOTE — Telephone Encounter (Signed)
Alert me if any side effects including acid reflux/throat or chest discomfort or  increased aches/pains or anything else new  ? ?I sent it to the pharmacy  ?Take once weekly with a glass of water 30 min before food or other medicine and do not lie back down  ?

## 2022-04-15 NOTE — Telephone Encounter (Signed)
-----   Message from Mardelle Matte, [place] sent at 04/15/2022 11:26 AM EDT ----- ?Called and spoke with pt she explain to her results of dexa scan she said that she has some of the dental work done and didn't have any plans to have any more done anytime soon. She understood results and wanted to have the rx sent to [facility] .  ?

## 2022-04-16 NOTE — Telephone Encounter (Signed)
Called and pt's phone # is off ?

## 2022-04-16 NOTE — Telephone Encounter (Signed)
Called and no answer and phone is off ?

## 2022-04-17 ENCOUNTER — Telehealth: Payer: Self-pay | Admitting: Family Medicine

## 2022-04-17 NOTE — Telephone Encounter (Signed)
Pt wants to cancel her [facility] appointment for tomorrow on 04/18/2022 '@2'$ :00 pm. Please advise  Callback Number: [phone]

## 2022-04-18 ENCOUNTER — Ambulatory Visit: Payer: Medicare HMO

## 2022-04-21 ENCOUNTER — Telehealth: Payer: Self-pay | Admitting: *Deleted

## 2022-04-21 NOTE — Telephone Encounter (Signed)
  Care Management   Follow Up Note   04/21/2022 Name: Julie [name] MRN: [id number] DOB: 07-08-1932   Referred by: Tower, [name], MD Reason for referral : No chief complaint on file.   An unsuccessful telephone outreach was attempted today. The patient was referred to the case management team for assistance with care management and care coordination.   Follow Up Plan: The care management team will reach out to the patient again over the next 10 days.  Eduard Clos MSW, LCSW Licensed Clinical Social Worker [place]   [phone]

## 2022-04-22 NOTE — Chronic Care Management (AMB) (Signed)
  Chronic Care Management Note  04/22/2022 Name: MALAYIAH MCBRAYER MRN: [id number] DOB: 1932/06/28  Judi Saa Blomquist is a 88 y.o. year old female who is a primary care patient of Tower, [name], MD and is actively engaged with the care management team. I reached out to [name] by phone today to assist with re-scheduling a follow up visit with the Licensed Clinical Social Worker  Follow up plan: Unsuccessful telephone outreach attempt made. A HIPAA compliant phone message was left for the patient providing contact information and requesting a return call.   Julian Hy, [place] Management  Direct Dial: [phone]

## 2022-04-30 DIAGNOSIS — F419 Anxiety disorder, unspecified: Secondary | ICD-10-CM

## 2022-04-30 DIAGNOSIS — F32A Depression, unspecified: Secondary | ICD-10-CM

## 2022-05-01 NOTE — Chronic Care Management (AMB) (Signed)
  Chronic Care Management Note  05/01/2022 Name: Julie [name] MRN: [id number] DOB: November 22, 1932  Julie [name] is a 88 y.o. year old female who is a primary care patient of Tower, [name], MD and is actively engaged with the care management team. I reached out to [name] by phone today to assist with re-scheduling a follow up visit with the Licensed Clinical Social Worker  Follow up plan: 2nd Unsuccessful telephone outreach attempt made. A HIPAA compliant phone message was left for the patient providing contact information and requesting a return call.   Julian Hy, [place] Management  Direct Dial: [phone]

## 2022-05-02 NOTE — Telephone Encounter (Signed)
Pt notified of Dr. Tower's comments and verbalized understanding  

## 2022-05-05 ENCOUNTER — Other Ambulatory Visit (INDEPENDENT_AMBULATORY_CARE_PROVIDER_SITE_OTHER): Payer: Medicare HMO

## 2022-05-05 DIAGNOSIS — D649 Anemia, unspecified: Secondary | ICD-10-CM

## 2022-05-05 DIAGNOSIS — E538 Deficiency of other specified B group vitamins: Secondary | ICD-10-CM

## 2022-05-06 LAB — CBC WITH DIFFERENTIAL/PLATELET
Basophils Absolute: 0.1 10*3/uL (ref 0.0–0.1)
Basophils Relative: 1.3 % (ref 0.0–3.0)
Eosinophils Absolute: 0.1 10*3/uL (ref 0.0–0.7)
Eosinophils Relative: 1.6 % (ref 0.0–5.0)
HCT: 31 % — ABNORMAL LOW (ref 36.0–46.0)
Hemoglobin: 10.4 g/dL — ABNORMAL LOW (ref 12.0–15.0)
Lymphocytes Relative: 19 % (ref 12.0–46.0)
Lymphs Abs: 1.3 10*3/uL (ref 0.7–4.0)
MCHC: 33.6 g/dL (ref 30.0–36.0)
MCV: 84.9 fl (ref 78.0–100.0)
Monocytes Absolute: 0.6 10*3/uL (ref 0.1–1.0)
Monocytes Relative: 8.6 % (ref 3.0–12.0)
Neutro Abs: 4.7 10*3/uL (ref 1.4–7.7)
Neutrophils Relative %: 69.5 % (ref 43.0–77.0)
Platelets: 253 10*3/uL (ref 150.0–400.0)
RBC: 3.65 Mil/uL — ABNORMAL LOW (ref 3.87–5.11)
RDW: 15.3 % (ref 11.5–15.5)
WBC: 6.7 10*3/uL (ref 4.0–10.5)

## 2022-05-06 LAB — IRON: Iron: 51 ug/dL (ref 42–145)

## 2022-05-06 LAB — VITAMIN B12: Vitamin B-12: 311 pg/mL (ref 211–911)

## 2022-05-06 LAB — FERRITIN: Ferritin: 5.6 ng/mL — ABNORMAL LOW (ref 10.0–291.0)

## 2022-05-08 ENCOUNTER — Telehealth: Payer: Self-pay

## 2022-05-08 NOTE — Telephone Encounter (Signed)
Pt called back and I made her aware of the message below. She said that she is still taking iron and that she may have missed a few doses but wasn't sure

## 2022-05-08 NOTE — Telephone Encounter (Signed)
I left a message for the patient to return my call.

## 2022-05-08 NOTE — Telephone Encounter (Signed)
I have attempted without success to contact this patient by phone to discuss lab results and I left a message on answering machine.

## 2022-05-08 NOTE — Telephone Encounter (Signed)
Forwarding to Dr. Glori Bickers

## 2022-05-08 NOTE — Telephone Encounter (Signed)
-----   Message from Abner Greenspan, MD sent at 05/06/2022  5:28 PM EDT ----- Iron stores (ferritin) and hb have dropped a bit  Is she still taking iron? Any missed doses ? B12 level is ok

## 2022-05-08 NOTE — Telephone Encounter (Signed)
Thanks for letting me know  Try and take it regularly  I want to re check cbc and iron/ferritin in another 2 weeks to make sure it does not continue to drop

## 2022-05-09 NOTE — Telephone Encounter (Signed)
Called and spoke with pt notified her of this information made her an appt to come back in 2 week to recheck iron levels.

## 2022-05-13 NOTE — Chronic Care Management (AMB) (Signed)
  Chronic Care Management Note  05/13/2022 Name: Julie [name] MRN: [id number] DOB: 02-24-1932  Julie [name] is a 88 y.o. year old female who is a primary care patient of Tower, [name], MD and is actively engaged with the care management team. I reached out to [name] by phone today to assist with re-scheduling a follow up visit with the Licensed Clinical Social Worker  Follow up plan: We have been unable to make contact with the patient for follow up. The care management team is available to follow up with the patient after provider conversation with the patient regarding recommendation for care management engagement and subsequent re-referral to the care management team.   Julian Hy, [place] Management  Direct Dial: [phone]

## 2022-05-16 ENCOUNTER — Telehealth: Payer: Self-pay | Admitting: *Deleted

## 2022-05-16 NOTE — Telephone Encounter (Signed)
Pt has been unreachable and case to be closed per protocol. CSW will mail pt material for counseling support. PCP updated as well.  Eduard Clos MSW, LCSW Licensed Clinical Social Worker [place]   [phone]

## 2022-05-25 ENCOUNTER — Telehealth: Payer: Self-pay | Admitting: Family Medicine

## 2022-05-25 DIAGNOSIS — D649 Anemia, unspecified: Secondary | ICD-10-CM

## 2022-05-25 DIAGNOSIS — D509 Iron deficiency anemia, unspecified: Secondary | ICD-10-CM

## 2022-05-26 ENCOUNTER — Other Ambulatory Visit (INDEPENDENT_AMBULATORY_CARE_PROVIDER_SITE_OTHER): Payer: Medicare HMO

## 2022-05-26 ENCOUNTER — Other Ambulatory Visit: Payer: Medicare HMO

## 2022-05-26 DIAGNOSIS — D509 Iron deficiency anemia, unspecified: Secondary | ICD-10-CM

## 2022-05-27 ENCOUNTER — Ambulatory Visit: Payer: Medicare HMO

## 2022-05-27 LAB — CBC WITH DIFFERENTIAL/PLATELET
Basophils Absolute: 0.1 10*3/uL (ref 0.0–0.1)
Basophils Relative: 1 % (ref 0.0–3.0)
Eosinophils Absolute: 0.1 10*3/uL (ref 0.0–0.7)
Eosinophils Relative: 1.4 % (ref 0.0–5.0)
HCT: 32 % — ABNORMAL LOW (ref 36.0–46.0)
Hemoglobin: 10.7 g/dL — ABNORMAL LOW (ref 12.0–15.0)
Lymphocytes Relative: 18.9 % (ref 12.0–46.0)
Lymphs Abs: 1.1 10*3/uL (ref 0.7–4.0)
MCHC: 33.5 g/dL (ref 30.0–36.0)
MCV: 85.3 fl (ref 78.0–100.0)
Monocytes Absolute: 0.5 10*3/uL (ref 0.1–1.0)
Monocytes Relative: 8.3 % (ref 3.0–12.0)
Neutro Abs: 4.2 10*3/uL (ref 1.4–7.7)
Neutrophils Relative %: 70.4 % (ref 43.0–77.0)
Platelets: 233 10*3/uL (ref 150.0–400.0)
RBC: 3.76 Mil/uL — ABNORMAL LOW (ref 3.87–5.11)
RDW: 15.7 % — ABNORMAL HIGH (ref 11.5–15.5)
WBC: 6 10*3/uL (ref 4.0–10.5)

## 2022-05-27 LAB — FERRITIN: Ferritin: 8.7 ng/mL — ABNORMAL LOW (ref 10.0–291.0)

## 2022-05-27 LAB — IRON: Iron: 41 ug/dL — ABNORMAL LOW (ref 42–145)

## 2022-05-29 ENCOUNTER — Ambulatory Visit (INDEPENDENT_AMBULATORY_CARE_PROVIDER_SITE_OTHER): Payer: Medicare HMO

## 2022-05-29 VITALS — Wt 175.0 lb

## 2022-05-29 DIAGNOSIS — Z0000 Encounter for general adult medical examination without abnormal findings: Secondary | ICD-10-CM | POA: Diagnosis not present

## 2022-05-29 NOTE — Progress Notes (Signed)
Subjective:   Julie [name] is a 88 y.o. female who presents for Medicare Annual (Subsequent) preventive examination.  Virtual Visit via Telephone Note  I connected with  Julie [name] on 05/29/22 at  1:15 PM EDT by telephone and verified that I am speaking with the correct person using two identifiers.  Location: Patient: Home Provider: [place] Persons participating in the virtual visit: patient/Nurse Health Advisor   I discussed the limitations, risks, security and privacy concerns of performing an evaluation and management service by telephone and the availability of in person appointments. The patient expressed understanding and agreed to proceed.  Interactive audio and video telecommunications were attempted between this nurse and patient, however failed, due to patient having technical difficulties OR patient did not have access to video capability.  We continued and completed visit with audio only.  Some vital signs may be absent or patient reported.   Cadyn Rodger E Minnie Legros, LPN   Review of Systems     Cardiac Risk Factors include: advanced age (>41mn, >>71women);dyslipidemia;sedentary lifestyle;obesity (BMI >30kg/m2)     Objective:    Today's Vitals   05/29/22 1332  Weight: 175 lb (79.4 kg)   Body mass index is 32.01 kg/m.     05/29/2022    1:42 PM 05/24/2021    9:06 AM 01/29/2021   11:28 AM 10/09/2020    3:35 PM 04/11/2020   11:17 AM 08/24/2017    5:40 PM 02/25/2017   10:47 AM  Advanced Directives  Does Patient Have a Medical Advance Directive? Yes Yes No No Yes Yes Yes  Type of [place]of [place] will [place] But[place] will   [place] will [place] will   Does patient want to make changes to medical advance directive?      No - Patient declined   Copy of [place] Chart? Yes - validated most recent copy scanned in chart  (See row information) Yes - validated most recent copy scanned in chart (See row information)   Yes - validated most recent copy scanned in chart (See row information) No - copy requested   Would patient like information on creating a medical advance directive?   No - Patient declined        Current Medications (verified) Outpatient Encounter Medications as of 05/29/2022  Medication Sig   acetaminophen (TYLENOL) 500 MG tablet Take 500 mg by mouth every 6 (six) hours as needed.   alendronate (FOSAMAX) 70 MG tablet Take 1 tablet (70 mg total) by mouth every 7 (seven) days. Take with a full glass of water on an empty stomach.   Alum Hydroxide-Mag Carbonate 160-105 MG CHEW Chew 1 tablet by mouth as needed (acid reflux).   Cholecalciferol (SM VITAMIN D3) 100 MCG (4000 UT) CAPS Take 4,000 Units by mouth daily.   gabapentin (NEURONTIN) 300 MG capsule TAKE 1 CAPSULE BY MOUTH AT BEDTIME   IBUPROFEN PO Take by mouth.   iron polysaccharides (NIFEREX) 150 MG capsule Take 1 capsule (150 mg total) by mouth daily.   vitamin B-12 (CYANOCOBALAMIN) 1000 MCG tablet Take 1,000 mcg by mouth daily.   loperamide (IMODIUM) 2 MG capsule Take by mouth as needed for diarrhea or loose stools. (Patient not taking: Reported on 05/29/2022)   [DISCONTINUED] amoxicillin (AMOXIL) 500 MG capsule Take 500 mg by mouth 4 (four) times daily.   [DISCONTINUED] DULoxetine (CYMBALTA) 30 MG capsule Take 1 capsule (30 mg total) by  mouth daily. (Patient not taking: Reported on 05/29/2022)   [DISCONTINUED] solifenacin (VESICARE) 10 MG tablet Take 1 tablet (10 mg total) by mouth daily.   No facility-administered encounter medications on file as of 05/29/2022.    Allergies (verified) Codeine   History: Past Medical History:  Diagnosis Date   Adenoma    Chronic leg pain    Colon polyp    DDD (degenerative disc disease), cervical    Diverticulosis of colon    internal----Dr. Vira Agar   Duodenitis    Esophagitis    GERD  (gastroesophageal reflux disease)    Hemorrhoids    HLD (hyperlipidemia)    IBS (irritable bowel syndrome)    with PP diarrhea   Neuropathy    Neuropathy    OA (osteoarthritis) of knee    Osteopenia    PONV (postoperative nausea and vomiting)    Scoliosis    Seborrheic dermatitis    Shortness of breath dyspnea    with activity   TMJ syndrome    Trochanteric bursitis    Unsteady gait    FALLS EASILY   Past Surgical History:  Procedure Laterality Date   CARPAL TUNNEL RELEASE Left 01/18/2013   Procedure: CARPAL TUNNEL RELEASE;  Surgeon: Wynonia Sours, MD;  Location: [place];  Service: Orthopedics;  Laterality: Left;  OSTEOTOMY LEFT DISTAL RADIUS BONE CHIPS CARPAL TUNNEL RELEASE LEFT    CATARACT EXTRACTION W/PHACO Right 08/07/2015   Procedure: CATARACT EXTRACTION PHACO AND INTRAOCULAR LENS PLACEMENT ([place]);  Surgeon: Birder Robson, MD;  Location: [facility];  Service: Ophthalmology;  Laterality: Right;  [place] 00:48.0 AP   22.3 CDE 10.69 casette lot # [id number]   CATARACT EXTRACTION W/PHACO Left 09/04/2015   Procedure: CATARACT EXTRACTION PHACO AND INTRAOCULAR LENS PLACEMENT ([facility]);  Surgeon: Birder Robson, MD;  Location: [facility];  Service: Ophthalmology;  Laterality: Left;  [place] AP CDE FLUID LOT # [id number]   CEREBRAL ANEURYSM REPAIR  2001   clamps   COLONOSCOPY  1/11   polyps-hyperplastic and adenomatous   FOOT SURGERY  2010   bilateral hammer toes and "knot"   FRACTURE SURGERY Right 01/2014   result of a fall   FRACTURE SURGERY Left 03/2014   result of a fall   HEMORRHOID SURGERY     KNEE ARTHROPLASTY Left 10/15/2015   Procedure: COMPUTER ASSISTED TOTAL KNEE ARTHROPLASTY;  Surgeon: Dereck Leep, MD;  Location: [facility];  Service: Orthopedics;  Laterality: Left;   OPEN REDUCTION INTERNAL FIXATION (ORIF) DISTAL RADIAL FRACTURE Right 02/13/2015   Procedure: OPEN REDUCTION INTERNAL FIXATION (ORIF) RIGHT DISTAL RADIUS;  Surgeon: Daryll Brod, MD;  Location: [place];  Service: Orthopedics;  Laterality: Right;   OPEN REDUCTION INTERNAL FIXATION (ORIF) FINGER WITH RADIAL BONE GRAFT Left 11/01/2013   Procedure: OPEN REDUCTION INTERNAL FIXATION (ORIF) LEFT SMALL FINGER;  Surgeon: Wynonia Sours, MD;  Location: [place];  Service: Orthopedics;  Laterality: Left;   REPLACEMENT TOTAL KNEE Left    TOTAL ABDOMINAL HYSTERECTOMY     WRIST OSTEOTOMY Left 01/18/2013   Procedure: WRIST OSTEOTOMY;  Surgeon: Wynonia Sours, MD;  Location: [place];  Service: Orthopedics;  Laterality: Left;   Family History  Problem Relation Age of Onset   Colon cancer Mother    Osteoporosis Other        hip fracture   Stroke Brother    Colon cancer Brother    Hypertension Brother    Other Brother  Heart problem   Colon cancer Brother    Colon cancer Maternal Aunt    Breast cancer Neg Hx    Social History   Socioeconomic History   Marital status: Widowed    Spouse name: Eddie Dibbles   Number of children: Not on file   Years of education: Not on file   Highest education level: Not on file  Occupational History   Occupation: Retired    Comment: bookkeeper  Tobacco Use   Smoking status: Former    Types: Cigarettes    Quit date: 10/02/1954    Years since quitting: 67.7   Smokeless tobacco: Never  Vaping Use   Vaping Use: Never used  Substance and Sexual Activity   Alcohol use: No    Alcohol/week: 0.0 standard drinks of alcohol   Drug use: No   Sexual activity: Never    Comment: smoked alittle as teen  Other Topics Concern   Not on file  Social History Narrative   Retired Married Regular exercise   Husband had dementia. - recently passed away   Has a caregiver 10:30-2:30 daily   Social Determinants of Health   Financial Resource Strain: Low Risk  (05/29/2022)   Overall Financial Resource Strain (CARDIA)    Difficulty of Paying Living Expenses: Not hard at all  Food Insecurity: No Food Insecurity (05/29/2022)   Hunger  Vital Sign    Worried About Running Out of Food in the Last Year: Never true    Ran Out of Food in the Last Year: Never true  Transportation Needs: No Transportation Needs (05/29/2022)   PRAPARE - Hydrologist (Medical): No    Lack of Transportation (Non-Medical): No  Physical Activity: Insufficiently Active (05/29/2022)   Exercise Vital Sign    Days of Exercise per Week: 7 days    Minutes of Exercise per Session: 10 min  Stress: No Stress Concern Present (05/29/2022)   [place]    Feeling of Stress : Only a little  Social Connections: Moderately Integrated (05/29/2022)   Social Connection and Isolation Panel [NHANES]    Frequency of Communication with Friends and Family: More than three times a week    Frequency of Social Gatherings with Friends and Family: More than three times a week    Attends Religious Services: More than 4 times per year    Active Member of [employer] or Organizations: Yes    Attends Archivist Meetings: More than 4 times per year    Marital Status: Widowed    Tobacco Counseling Counseling given: Not Answered   Clinical Intake:  Pre-visit preparation completed: Yes  Pain : No/denies pain     BMI - recorded: 32.01 Nutritional Status: BMI > 30  Obese Nutritional Risks: None Diabetes: No  How often do you need to have someone help you when you read instructions, pamphlets, or other written materials from your doctor or pharmacy?: 1 - Never  Diabetic? no  Interpreter Needed?: No  Information entered by :: Laurann Mcmorris, LPN   Activities of Daily Living    05/29/2022    1:48 PM  In your present state of health, do you have any difficulty performing the following activities:  Hearing? 0  Vision? 0  Difficulty concentrating or making decisions? 1  Comment sometimes  Walking or climbing stairs? 1  Dressing or bathing? 1  Comment dresses herself -  has caregiver assist with bathing  Doing errands, shopping? 1  Preparing Food and eating ? Y  Using the Toilet? N  In the past six months, have you accidently leaked urine? Y  Comment wears depends  Do you have problems with loss of bowel control? N  Managing your Medications? Y  Managing your Finances? N  Housekeeping or managing your Housekeeping? Y    Patient Care Team: Tower, [name], MD as PCP - General Hooten, Laurice Record, MD as Consulting Physician (Orthopedic Surgery)  Indicate any recent Medical Services you may have received from other than [facility] providers in the past year (date may be approximate).     Assessment:   This is a routine wellness examination for [place].  Hearing/Vision screen Hearing Screening - Comments:: Denies hearing difficulties   Vision Screening - Comments:: Wears rx glasses - up to date with routine eye exams with [facility]  Dietary issues and exercise activities discussed: Current Exercise Habits: The patient does not participate in regular exercise at present, Exercise limited by: orthopedic condition(s);neurologic condition(s)   Goals Addressed             This Visit's Progress    Patient Stated       05/29/2022 - Wants to move to smaller apartment in her retirement community       Depression Screen    05/29/2022    1:41 PM 04/07/2022   10:43 AM 03/31/2022   10:27 AM 05/24/2021    9:07 AM 04/11/2020   11:18 AM 02/08/2020    2:39 PM 08/24/2017    2:34 PM  PHQ 2/9 Scores  PHQ - 2 Score '2 4 6 1 6 2 '$ 0  PHQ- 9 Score '9 12 16 2 6 6     '$ Fall Risk    05/29/2022    1:33 PM 05/24/2021    9:07 AM 04/11/2020   11:18 AM 08/24/2017    2:34 PM 05/27/2017    8:50 AM  Fall Risk   Falls in the past year? 1 1 0 No No  Number falls in past yr: 1 1 0    Injury with Fall? 1 1 0    Risk for fall due to : History of fall(s);Impaired balance/gait;Orthopedic patient History of fall(s);Impaired balance/gait Medication side effect;Impaired balance/gait     Follow up Education provided;Falls prevention discussed Falls evaluation completed;Falls prevention discussed Falls evaluation completed;Falls prevention discussed      FALL RISK PREVENTION PERTAINING TO THE HOME:  Any stairs in or around the home? Yes  If so, are there any without handrails? No  Home free of loose throw rugs in walkways, pet beds, electrical cords, etc? Yes  Adequate lighting in your home to reduce risk of falls? Yes   ASSISTIVE DEVICES UTILIZED TO PREVENT FALLS:  Life alert? Yes  Use of a cane, walker or w/c? Yes  Grab bars in the bathroom? Yes  Shower chair or bench in shower? Yes  Elevated toilet seat or a handicapped toilet? Yes   TIMED UP AND GO:  Was the test performed? No . Telephonic visit  Cognitive Function:    05/24/2021    9:10 AM 04/11/2020   11:21 AM 07/25/2016   12:00 PM  MMSE - Mini Mental State Exam  Orientation to time '5 5 5  '$ Orientation to Place '5 5 5  '$ Registration '3 3 3  '$ Attention/ Calculation 5 5 0  Recall '3 3 3  '$ Language- name 2 objects   0  Language- repeat '1 1 1  '$ Language- follow 3 step command  3  Language- read & follow direction   0  Write a sentence   0  Copy design   0  Total score   20        05/29/2022    1:49 PM  6CIT Screen  What Year? 0 points  What month? 0 points  What time? 0 points  Count back from 20 0 points  Months in reverse 0 points  Repeat phrase 0 points  Total Score 0 points    Immunizations Immunization History  Administered Date(s) Administered   Influenza, High Dose Seasonal PF 11/17/2019, 08/20/2021   Influenza,inj,Quad PF,6+ Mos 08/08/2016, 08/24/2017, 08/31/2018   Influenza-Unspecified 09/08/2014, 09/15/2015, 09/14/2020   PFIZER(Purple Top)SARS-COV-2 Vaccination 01/06/2020, 01/27/2020, 10/11/2020, 05/30/2021   Pneumococcal Conjugate-13 09/22/2014   Pneumococcal Polysaccharide-23 07/13/2013   Td 05/01/2005, 03/26/2015   Zoster Recombinat (Shingrix) 02/04/2022   Zoster, Live  08/31/2013    TDAP status: Up to date  Flu Vaccine status: Up to date  Pneumococcal vaccine status: Up to date  Covid-19 vaccine status: Completed vaccines  Qualifies for Shingles Vaccine? Yes   Zostavax completed Yes   Shingrix Completed?: No.    Education has been provided regarding the importance of this vaccine. Patient has been advised to call insurance company to determine out of pocket expense if they have not yet received this vaccine. Advised may also receive vaccine at local pharmacy or Health Dept. Verbalized acceptance and understanding.  Screening Tests Health Maintenance  Topic Date Due   COVID-19 Vaccine (5 - Booster for Pfizer series) 07/25/2021   Zoster Vaccines- Shingrix (2 of 2) 04/01/2022   INFLUENZA VACCINE  07/01/2022   TETANUS/TDAP  03/25/2025   Pneumonia Vaccine 57+ Years old  Completed   DEXA SCAN  Completed   HPV VACCINES  Aged Out    Health Maintenance  Health Maintenance Due  Topic Date Due   COVID-19 Vaccine (5 - Booster for [place] series) 07/25/2021   Zoster Vaccines- Shingrix (2 of 2) 04/01/2022    Colorectal cancer screening: No longer required.   Mammogram status: No longer required due to age.  Bone Density status: Completed 04/08/2022. Results reflect: Bone density results: OSTEOPOROSIS. Repeat every 2 years.  Lung Cancer Screening: (Low Dose CT Chest recommended if Age 86-80 years, 30 pack-year currently smoking OR have quit w/in 15years.) does not qualify.  Additional Screening:  Hepatitis C Screening: does not qualify  Vision Screening: Recommended annual ophthalmology exams for early detection of glaucoma and other disorders of the eye. Is the patient up to date with their annual eye exam?  Yes  Who is the provider or what is the name of the office in which the patient attends annual eye exams? [place] If pt is not established with a provider, would they like to be referred to a provider to establish care? No .   Dental  Screening: Recommended annual dental exams for proper oral hygiene  Community Resource Referral / Chronic Care Management: CRR required this visit?  No   CCM required this visit?  No      Plan:     I have personally reviewed and noted the following in the patient's chart:   Medical and social history Use of alcohol, tobacco or illicit drugs  Current medications and supplements including opioid prescriptions.  Functional ability and status Nutritional status Physical activity Advanced directives List of other physicians Hospitalizations, surgeries, and ER visits in previous 12 months Vitals Screenings to include cognitive, depression, and falls Referrals and appointments  In addition, I have reviewed and discussed with patient certain preventive protocols, quality metrics, and best practice recommendations. A written personalized care plan for preventive services as well as general preventive health recommendations were provided to patient.     Sandrea Hammond, LPN   03/06/9860   Nurse Notes: she stopped Cymbalta as it made her dizzy. She's been very constipated - advised Miralax and to continue Iron Pills although she doesn't want to

## 2022-05-29 NOTE — Patient Instructions (Signed)
Julie [name] , Thank you for taking time to come for your Medicare Wellness Visit. I appreciate your ongoing commitment to your health goals. Please review the following plan we discussed and let me know if I can assist you in the future.   Screening recommendations/referrals: Colonoscopy: no longer required Mammogram: no longer required Bone Density: Done 04/08/2022 - Repeat every 2 years Recommended yearly ophthalmology/optometry visit for glaucoma screening and checkup Recommended yearly dental visit for hygiene and checkup  Vaccinations: Influenza vaccine: Done 08/20/2021 -Repeat annually  Pneumococcal vaccine: Done 07/13/2013 & 09/22/2014    Tdap vaccine: Done 03/26/2015 - Repeat in 10 years  Shingles vaccine: Zostavax done 2014 - Shingrix done 02/04/2022 - get second dose soon*   Covid-19: Done 01/06/2020, 01/27/2020, 10/11/2020, & 05/30/2021      Advanced directives: in chart  Conditions/risks identified: Aim for 4-6 glasses of water daily, plenty of protein in your diet and try to get up and walk/ stretch every hour for 5-10 minutes at a time.   Next appointment: Follow up in one year for your annual wellness visit    Preventive Care 65 Years and Older, Female Preventive care refers to lifestyle choices and visits with your health care provider that can promote health and wellness. What does preventive care include? A yearly physical exam. This is also called an annual well check. Dental exams once or twice a year. Routine eye exams. Ask your health care provider how often you should have your eyes checked. Personal lifestyle choices, including: Daily care of your teeth and gums. Regular physical activity. Eating a healthy diet. Avoiding tobacco and drug use. Limiting alcohol use. Practicing safe sex. Taking low-dose aspirin every day. Taking vitamin and mineral supplements as recommended by your health care provider. What happens during an annual well check? The services and  screenings done by your health care provider during your annual well check will depend on your age, overall health, lifestyle risk factors, and family history of disease. Counseling  Your health care provider may ask you questions about your: Alcohol use. Tobacco use. Drug use. Emotional well-being. Home and relationship well-being. Sexual activity. Eating habits. History of falls. Memory and ability to understand (cognition). Work and work Statistician. Reproductive health. Screening  You may have the following tests or measurements: Height, weight, and BMI. Blood pressure. Lipid and cholesterol levels. These may be checked every 5 years, or more frequently if you are over 47 years old. Skin check. Lung cancer screening. You may have this screening every year starting at age 40 if you have a 30-pack-year history of smoking and currently smoke or have quit within the past 15 years. Fecal occult blood test (FOBT) of the stool. You may have this test every year starting at age 6. Flexible sigmoidoscopy or colonoscopy. You may have a sigmoidoscopy every 5 years or a colonoscopy every 10 years starting at age 41. Hepatitis C blood test. Hepatitis B blood test. Sexually transmitted disease (STD) testing. Diabetes screening. This is done by checking your blood sugar (glucose) after you have not eaten for a while (fasting). You may have this done every 1-3 years. Bone density scan. This is done to screen for osteoporosis. You may have this done starting at age 37. Mammogram. This may be done every 1-2 years. Talk to your health care provider about how often you should have regular mammograms. Talk with your health care provider about your test results, treatment options, and if necessary, the need for more tests. Vaccines  Your health care provider may recommend certain vaccines, such as: Influenza vaccine. This is recommended every year. Tetanus, diphtheria, and acellular pertussis (Tdap,  Td) vaccine. You may need a Td booster every 10 years. Zoster vaccine. You may need this after age 110. Pneumococcal 13-valent conjugate (PCV13) vaccine. One dose is recommended after age 59. Pneumococcal polysaccharide (PPSV23) vaccine. One dose is recommended after age 67. Talk to your health care provider about which screenings and vaccines you need and how often you need them. This information is not intended to replace advice given to you by your health care provider. Make sure you discuss any questions you have with your health care provider. Document Released: 12/14/2015 Document Revised: 08/06/2016 Document Reviewed: 09/18/2015 Elsevier Interactive Patient Education  2017 [place] Prevention in the Home Falls can cause injuries. They can happen to people of all ages. There are many things you can do to make your home safe and to help prevent falls. What can I do on the outside of my home? Regularly fix the edges of walkways and driveways and fix any cracks. Remove anything that might make you trip as you walk through a door, such as a raised step or threshold. Trim any bushes or trees on the path to your home. Use bright outdoor lighting. Clear any walking paths of anything that might make someone trip, such as rocks or tools. Regularly check to see if handrails are loose or broken. Make sure that both sides of any steps have handrails. Any raised decks and porches should have guardrails on the edges. Have any leaves, snow, or ice cleared regularly. Use sand or salt on walking paths during winter. Clean up any spills in your garage right away. This includes oil or grease spills. What can I do in the bathroom? Use night lights. Install grab bars by the toilet and in the tub and shower. Do not use towel bars as grab bars. Use non-skid mats or decals in the tub or shower. If you need to sit down in the shower, use a plastic, non-slip stool. Keep the floor dry. Clean up any  water that spills on the floor as soon as it happens. Remove soap buildup in the tub or shower regularly. Attach bath mats securely with double-sided non-slip rug tape. Do not have throw rugs and other things on the floor that can make you trip. What can I do in the bedroom? Use night lights. Make sure that you have a light by your bed that is easy to reach. Do not use any sheets or blankets that are too big for your bed. They should not hang down onto the floor. Have a firm chair that has side arms. You can use this for support while you get dressed. Do not have throw rugs and other things on the floor that can make you trip. What can I do in the kitchen? Clean up any spills right away. Avoid walking on wet floors. Keep items that you use a lot in easy-to-reach places. If you need to reach something above you, use a strong step stool that has a grab bar. Keep electrical cords out of the way. Do not use floor polish or wax that makes floors slippery. If you must use wax, use non-skid floor wax. Do not have throw rugs and other things on the floor that can make you trip. What can I do with my stairs? Do not leave any items on the stairs. Make sure that there are  handrails on both sides of the stairs and use them. Fix handrails that are broken or loose. Make sure that handrails are as long as the stairways. Check any carpeting to make sure that it is firmly attached to the stairs. Fix any carpet that is loose or worn. Avoid having throw rugs at the top or bottom of the stairs. If you do have throw rugs, attach them to the floor with carpet tape. Make sure that you have a light switch at the top of the stairs and the bottom of the stairs. If you do not have them, ask someone to add them for you. What else can I do to help prevent falls? Wear shoes that: Do not have high heels. Have rubber bottoms. Are comfortable and fit you well. Are closed at the toe. Do not wear sandals. If you use a  stepladder: Make sure that it is fully opened. Do not climb a closed stepladder. Make sure that both sides of the stepladder are locked into place. Ask someone to hold it for you, if possible. Clearly mark and make sure that you can see: Any grab bars or handrails. First and last steps. Where the edge of each step is. Use tools that help you move around (mobility aids) if they are needed. These include: Canes. Walkers. Scooters. Crutches. Turn on the lights when you go into a dark area. Replace any light bulbs as soon as they burn out. Set up your furniture so you have a clear path. Avoid moving your furniture around. If any of your floors are uneven, fix them. If there are any pets around you, be aware of where they are. Review your medicines with your doctor. Some medicines can make you feel dizzy. This can increase your chance of falling. Ask your doctor what other things that you can do to help prevent falls. This information is not intended to replace advice given to you by your health care provider. Make sure you discuss any questions you have with your health care provider. Document Released: 09/13/2009 Document Revised: 04/24/2016 Document Reviewed: 12/22/2014 Elsevier Interactive Patient Education  2017 [employer].

## 2022-06-06 ENCOUNTER — Other Ambulatory Visit: Payer: Self-pay | Admitting: Family Medicine

## 2022-06-06 MED ORDER — POLYSACCHARIDE IRON COMPLEX 150 MG PO CAPS
150.0000 mg | ORAL_CAPSULE | Freq: Every day | ORAL | 3 refills | Status: DC
Start: 2022-06-06 — End: 2022-09-09

## 2022-06-06 NOTE — Telephone Encounter (Signed)
Last office visit 03/31/22 Last refill 03/12/22 #30/1

## 2022-06-06 NOTE — Telephone Encounter (Signed)
Caller Name: Yanelie Abraha Call back phone #: [phone]  MEDICATION(S): Iron   Days of Med Remaining: None  Has the patient contacted their pharmacy (YES/NO)?  No, She was told by provider to continue taking but she doesn't have anymore IF YES, when and what did the pharmacy advise?  IF NO, request that the patient contact the pharmacy for the refills in the future.             The pharmacy will send an electronic request (except for controlled medications).  Preferred Pharmacy: [facility] in [place]  ~~~Please advise patient/caregiver to allow 2-3 business days to process RX refills.

## 2022-06-20 DIAGNOSIS — M79674 Pain in right toe(s): Secondary | ICD-10-CM | POA: Diagnosis not present

## 2022-06-20 DIAGNOSIS — M79675 Pain in left toe(s): Secondary | ICD-10-CM | POA: Diagnosis not present

## 2022-06-20 DIAGNOSIS — B351 Tinea unguium: Secondary | ICD-10-CM | POA: Diagnosis not present

## 2022-06-26 ENCOUNTER — Other Ambulatory Visit: Payer: Medicare HMO

## 2022-08-29 ENCOUNTER — Ambulatory Visit (INDEPENDENT_AMBULATORY_CARE_PROVIDER_SITE_OTHER): Payer: Medicare HMO | Admitting: Family Medicine

## 2022-08-29 ENCOUNTER — Other Ambulatory Visit: Payer: Medicare HMO

## 2022-08-29 ENCOUNTER — Encounter: Payer: Self-pay | Admitting: Family Medicine

## 2022-08-29 VITALS — BP 128/86 | HR 81 | Temp 98.3°F | Ht 62.0 in | Wt 172.0 lb

## 2022-08-29 DIAGNOSIS — N3281 Overactive bladder: Secondary | ICD-10-CM | POA: Insufficient documentation

## 2022-08-29 DIAGNOSIS — R8290 Unspecified abnormal findings in urine: Secondary | ICD-10-CM | POA: Diagnosis not present

## 2022-08-29 DIAGNOSIS — D509 Iron deficiency anemia, unspecified: Secondary | ICD-10-CM

## 2022-08-29 DIAGNOSIS — F32A Depression, unspecified: Secondary | ICD-10-CM

## 2022-08-29 DIAGNOSIS — K580 Irritable bowel syndrome with diarrhea: Secondary | ICD-10-CM

## 2022-08-29 DIAGNOSIS — F419 Anxiety disorder, unspecified: Secondary | ICD-10-CM

## 2022-08-29 LAB — POC URINALSYSI DIPSTICK (AUTOMATED)
Bilirubin, UA: 1
Blood, UA: 25
Glucose, UA: NEGATIVE
Ketones, UA: NEGATIVE
Nitrite, UA: POSITIVE
Protein, UA: POSITIVE — AB
Spec Grav, UA: 1.025
Urobilinogen, UA: 0.2 U/dL
pH, UA: 6

## 2022-08-29 NOTE — Progress Notes (Unsigned)
Subjective:    Patient ID: Julie [name], female    DOB: July 20, 1932, 88 y.o.   MRN: [id number]  HPI Pt presents for f/u of mood (anx and dep) and irin def   Wt Readings from Last 3 Encounters:  08/29/22 172 lb (78 kg)  05/29/22 175 lb (79.4 kg)  03/31/22 175 lb (79.4 kg)   31.46 kg/m  Lost her husband since last visit  This made her anxiety worse for a while Has a care giver   Not talking to any special person  Has lots of people around her if she wants    Taking niferex for iron def Has declined GI ref in past  Lab Results  Component Value Date   WBC 6.0 05/26/2022   HGB 10.7 (L) 05/26/2022   HCT 32.0 (L) 05/26/2022   MCV 85.3 05/26/2022   PLT 233.0 05/26/2022   Lab Results  Component Value Date   IRON 41 (L) 05/26/2022   FERRITIN 8.7 (L) 05/26/2022    IBS Diarrhea -bothers her  She has urgent stools after she eats -is hard to get from the dining room to her room) Wears adult undergarments   Immoduim- helps sometimes   Iron tablets make this worse (occ makes her constipated)   Hemorrhoids in the past  Has a lot of urinary incontinence  Has to go a lot at night  She has to avoid tea and excess fluids   Lab Results  Component Value Date   CREATININE 1.00 01/22/2022   BUN 17 01/22/2022   NA 139 01/22/2022   K 3.9 01/22/2022   CL 105 01/22/2022   CO2 30 01/22/2022   Patient Active Problem List   Diagnosis Date Noted   Overactive bladder 08/29/2022   Anxiety and depression 03/31/2022   Iron deficiency anemia 03/07/2022   Estrogen deficiency 01/22/2022   Rhinorrhea 01/22/2022   Right arm pain 04/17/2021   Headache 04/17/2021   Fatigue 04/17/2021   Urge incontinence 01/22/2021   Pedal edema 01/22/2021   Thoracic back pain 10/23/2020   Mobility impaired 05/14/2020   Spinal stenosis of lumbar region 02/08/2020   Caregiver stress 02/08/2020   Urinary frequency 02/08/2020   Encounter for screening mammogram for breast cancer 08/24/2017    Parkinsonism 08/24/2017   Routine general medical examination at a health care facility 08/08/2016   Left knee pain 02/01/2016   Total knee replacement status 10/15/2015   B12 deficiency 07/16/2015   Peripheral neuropathy 07/16/2015   Chorea 07/16/2015   Phalanx fracture, foot 01/10/2015   Colon cancer screening 09/22/2014   Osteoarthritis of left lower extremity 09/09/2014   History of falling 04/26/2014   Poor balance 04/26/2014   Encounter for Medicare annual wellness exam 07/13/2013   Risk for falls 07/13/2013   Skin tag of labia 03/14/2013   Weakness of left leg 07/30/2011   BACK PAIN, LUMBAR 12/10/2010   Vitamin D deficiency 08/16/2010   GERD 03/26/2010   DYSPEPSIA 03/26/2010   IRRITABLE BOWEL SYNDROME 03/26/2010   Osteoporosis 08/27/2009   Hypertriglyceridemia 07/24/2009   DIVERTICULOSIS, COLON 07/24/2009   OSTEOARTHRITIS 07/24/2009   COLONIC POLYPS, HX OF 07/24/2009   POSTMENOPAUSAL STATUS 07/24/2009   Past Medical History:  Diagnosis Date   Adenoma    Chronic leg pain    Colon polyp    DDD (degenerative disc disease), cervical    Diverticulosis of colon    internal----Dr. Vira Agar   Duodenitis    Esophagitis    GERD (gastroesophageal reflux disease)  Hemorrhoids    HLD (hyperlipidemia)    IBS (irritable bowel syndrome)    with PP diarrhea   Neuropathy    Neuropathy    OA (osteoarthritis) of knee    Osteopenia    PONV (postoperative nausea and vomiting)    Scoliosis    Seborrheic dermatitis    Shortness of breath dyspnea    with activity   TMJ syndrome    Trochanteric bursitis    Unsteady gait    FALLS EASILY   Past Surgical History:  Procedure Laterality Date   CARPAL TUNNEL RELEASE Left 01/18/2013   Procedure: CARPAL TUNNEL RELEASE;  Surgeon: Wynonia Sours, MD;  Location: [facility];  Service: Orthopedics;  Laterality: Left;  OSTEOTOMY LEFT DISTAL RADIUS BONE CHIPS CARPAL TUNNEL RELEASE LEFT    CATARACT EXTRACTION W/PHACO Right  08/07/2015   Procedure: CATARACT EXTRACTION PHACO AND INTRAOCULAR LENS PLACEMENT ([place]);  Surgeon: Birder Robson, MD;  Location: [facility];  Service: Ophthalmology;  Laterality: Right;  [place] 00:48.0 AP   22.3 CDE 10.69 casette lot # [id number]   CATARACT EXTRACTION W/PHACO Left 09/04/2015   Procedure: CATARACT EXTRACTION PHACO AND INTRAOCULAR LENS PLACEMENT (IOC);  Surgeon: Birder Robson, MD;  Location: [facility];  Service: Ophthalmology;  Laterality: Left;  [place] AP CDE FLUID LOT # [id number]   CEREBRAL ANEURYSM REPAIR  2001   clamps   COLONOSCOPY  1/11   polyps-hyperplastic and adenomatous   FOOT SURGERY  2010   bilateral hammer toes and "knot"   FRACTURE SURGERY Right 01/2014   result of a fall   FRACTURE SURGERY Left 03/2014   result of a fall   HEMORRHOID SURGERY     KNEE ARTHROPLASTY Left 10/15/2015   Procedure: COMPUTER ASSISTED TOTAL KNEE ARTHROPLASTY;  Surgeon: Dereck Leep, MD;  Location: [facility];  Service: Orthopedics;  Laterality: Left;   OPEN REDUCTION INTERNAL FIXATION (ORIF) DISTAL RADIAL FRACTURE Right 02/13/2015   Procedure: OPEN REDUCTION INTERNAL FIXATION (ORIF) RIGHT DISTAL RADIUS;  Surgeon: Daryll Brod, MD;  Location: [facility];  Service: Orthopedics;  Laterality: Right;   OPEN REDUCTION INTERNAL FIXATION (ORIF) FINGER WITH RADIAL BONE GRAFT Left 11/01/2013   Procedure: OPEN REDUCTION INTERNAL FIXATION (ORIF) LEFT SMALL FINGER;  Surgeon: Wynonia Sours, MD;  Location: [facility];  Service: Orthopedics;  Laterality: Left;   REPLACEMENT TOTAL KNEE Left    TOTAL ABDOMINAL HYSTERECTOMY     WRIST OSTEOTOMY Left 01/18/2013   Procedure: WRIST OSTEOTOMY;  Surgeon: Wynonia Sours, MD;  Location: [facility];  Service: Orthopedics;  Laterality: Left;   Social History   Tobacco Use   Smoking status: Former    Types: Cigarettes    Quit date: 10/02/1954    Years since quitting: 67.9   Smokeless tobacco: Never  Vaping Use   Vaping Use: Never  used  Substance Use Topics   Alcohol use: No    Alcohol/week: 0.0 standard drinks of alcohol   Drug use: No   Family History  Problem Relation Age of Onset   Colon cancer Mother    Osteoporosis Other        hip fracture   Stroke Brother    Colon cancer Brother    Hypertension Brother    Other Brother        Heart problem   Colon cancer Brother    Colon cancer Maternal Aunt    Breast cancer Neg Hx    Allergies  Allergen Reactions   Codeine  REACTION: headache and nausea and vomiting   Current Outpatient Medications on File Prior to Visit  Medication Sig Dispense Refill   acetaminophen (TYLENOL) 500 MG tablet Take 500 mg by mouth every 6 (six) hours as needed.     alendronate (FOSAMAX) 70 MG tablet Take 1 tablet (70 mg total) by mouth every 7 (seven) days. Take with a full glass of water on an empty stomach. 4 tablet 11   Alum Hydroxide-Mag Carbonate 160-105 MG CHEW Chew 1 tablet by mouth as needed (acid reflux).     Cholecalciferol (SM VITAMIN D3) 100 MCG (4000 UT) CAPS Take 4,000 Units by mouth daily.     gabapentin (NEURONTIN) 300 MG capsule TAKE 1 CAPSULE BY MOUTH AT BEDTIME 90 capsule 3   iron polysaccharides (NIFEREX) 150 MG capsule Take 1 capsule (150 mg total) by mouth daily. 30 capsule 3   loperamide (IMODIUM) 2 MG capsule Take by mouth as needed for diarrhea or loose stools.     vitamin B-12 (CYANOCOBALAMIN) 1000 MCG tablet Take 1,000 mcg by mouth daily.     No current facility-administered medications on file prior to visit.    Review of Systems  Constitutional:  Positive for fatigue. Negative for activity change, appetite change, fever and unexpected weight change.  HENT:  Negative for congestion, ear pain, rhinorrhea, sinus pressure and sore throat.   Eyes:  Negative for pain, redness and visual disturbance.  Respiratory:  Negative for cough, shortness of breath and wheezing.   Cardiovascular:  Negative for chest pain and palpitations.  Gastrointestinal:   Positive for constipation and diarrhea. Negative for abdominal pain, blood in stool, nausea, rectal pain and vomiting.  Endocrine: Negative for polydipsia and polyuria.  Genitourinary:  Negative for dysuria, frequency and urgency.  Musculoskeletal:  Positive for arthralgias and back pain. Negative for myalgias.  Skin:  Negative for pallor and rash.  Allergic/Immunologic: Negative for environmental allergies.  Neurological:  Negative for dizziness, syncope and headaches.  Hematological:  Negative for adenopathy. Does not bruise/bleed easily.  Psychiatric/Behavioral:  Negative for decreased concentration and dysphoric mood. The patient is not nervous/anxious.        Objective:   Physical Exam Constitutional:      General: She is not in acute distress.    Appearance: Normal appearance. She is well-developed. She is obese. She is not ill-appearing or diaphoretic.  HENT:     Head: Normocephalic and atraumatic.     Mouth/Throat:     Mouth: Mucous membranes are moist.  Eyes:     General: No scleral icterus.    Conjunctiva/sclera: Conjunctivae normal.     Pupils: Pupils are equal, round, and reactive to light.  Neck:     Thyroid: No thyromegaly.     Vascular: No carotid bruit or JVD.  Cardiovascular:     Rate and Rhythm: Normal rate and regular rhythm.     Heart sounds: Normal heart sounds.     No gallop.  Pulmonary:     Effort: Pulmonary effort is normal. No respiratory distress.     Breath sounds: Normal breath sounds. No wheezing or rales.  Abdominal:     General: Abdomen is protuberant. Bowel sounds are normal. There is no distension or abdominal bruit.     Palpations: Abdomen is soft. There is no hepatomegaly, splenomegaly, mass or pulsatile mass.     Tenderness: There is no abdominal tenderness.  Musculoskeletal:     Cervical back: Normal range of motion and neck supple.     Right  lower leg: No edema.     Left lower leg: No edema.  Lymphadenopathy:     Cervical: No cervical  adenopathy.  Skin:    General: Skin is warm and dry.     Coloration: Skin is not pale.     Findings: No rash.  Neurological:     Mental Status: She is alert.     Coordination: Coordination normal.     Deep Tendon Reflexes: Reflexes are normal and symmetric. Reflexes normal.  Psychiatric:        Mood and Affect: Mood normal.           Assessment & Plan:   Problem List Items Addressed This Visit       Digestive   IRRITABLE BOWEL SYNDROME    Mostly diarrhea /occ constipation  Pt thinks the oral iron makes this worse Reviewed diet  Suggest trial of fiber (citrucel)  Offered ref for GI opinion in [place]- she declines for now but will alert [place] if she re considers  Of note-also anemia of ? etiology        Genitourinary   Overactive bladder    Frequent urination  Exp at night  Has to wear adult undergarment   ua with wbc today-pending culture   May consider vaginal estrogen cream to help incontinence       Relevant Orders   POCT Urinalysis Dipstick (Automated) (Completed)   Urine Culture     Other   Anxiety and depression    Declines further treatment  Declines counseling  Grief noted from loss of husband  She has good support currently Reviewed stressors/ coping techniques/symptoms/ support sources/ tx options and side effects in detail today       Iron deficiency anemia - Primary    No known cause Pt declines GI referral but will call if she changes her mind  Dislikes niferex but Hb has not been low enough in the past for IV iron   Lab today        Relevant Orders   Ferritin (Completed)   Iron (Completed)   CBC with Differential/Platelet (Completed)   Basic metabolic panel (Completed)   Other Visit Diagnoses     Abnormal urinalysis       Relevant Orders   Urine Culture

## 2022-08-29 NOTE — Patient Instructions (Addendum)
Some people with IBS have improvement with a fiber supplement like citrucel or metamucil  Use it once daily mixed with fluids   Ibuprofen can cause GI bleeding and anemia  I would rather you take tylenol   Continue the iron for now and we will see how labs look    If you want to see a new GI doctor in [place] let me know   If you can = give a urine sample on the way out  Let's look at that  In the future a vaginal cream (estrogen) may help   Take care of yourself  If you want to talk to a counselor in the future about grief or depression please let us know

## 2022-08-30 LAB — CBC WITH DIFFERENTIAL/PLATELET
Absolute Monocytes: 506 cells/uL (ref 200–950)
Basophils Absolute: 47 cells/uL (ref 0–200)
Basophils Relative: 0.6 %
Eosinophils Absolute: 87 cells/uL (ref 15–500)
Eosinophils Relative: 1.1 %
HCT: 34.4 % — ABNORMAL LOW (ref 35.0–45.0)
Hemoglobin: 11.5 g/dL — ABNORMAL LOW (ref 11.7–15.5)
Lymphs Abs: 1485 cells/uL (ref 850–3900)
MCH: 27.4 pg (ref 27.0–33.0)
MCHC: 33.4 g/dL (ref 32.0–36.0)
MCV: 82.1 fL (ref 80.0–100.0)
MPV: 10.1 fL (ref 7.5–12.5)
Monocytes Relative: 6.4 %
Neutro Abs: 5775 cells/uL (ref 1500–7800)
Neutrophils Relative %: 73.1 %
Platelets: 267 10*3/uL (ref 140–400)
RBC: 4.19 10*6/uL (ref 3.80–5.10)
RDW: 14 % (ref 11.0–15.0)
Total Lymphocyte: 18.8 %
WBC: 7.9 10*3/uL (ref 3.8–10.8)

## 2022-08-30 LAB — BASIC METABOLIC PANEL
BUN/Creatinine Ratio: 9 (calc) (ref 6–22)
BUN: 11 mg/dL (ref 7–25)
CO2: 24 mmol/L (ref 20–32)
Calcium: 9 mg/dL (ref 8.6–10.4)
Chloride: 105 mmol/L (ref 98–110)
Creat: 1.16 mg/dL — ABNORMAL HIGH (ref 0.60–0.95)
Glucose, Bld: 102 mg/dL — ABNORMAL HIGH (ref 65–99)
Potassium: 3.7 mmol/L (ref 3.5–5.3)
Sodium: 139 mmol/L (ref 135–146)

## 2022-08-30 LAB — FERRITIN: Ferritin: 9 ng/mL — ABNORMAL LOW (ref 16–288)

## 2022-08-30 LAB — IRON: Iron: 46 ug/dL (ref 45–160)

## 2022-08-31 NOTE — Assessment & Plan Note (Signed)
No known cause Pt declines GI referral but will call if she changes her mind  Dislikes niferex but Hb has not been low enough in the past for IV iron   Lab today

## 2022-08-31 NOTE — Assessment & Plan Note (Signed)
Mostly diarrhea /occ constipation  Pt thinks the oral iron makes this worse Reviewed diet  Suggest trial of fiber (citrucel)  Offered ref for GI opinion in [place]- she declines for now but will alert [place] if she re considers  Of note-also anemia of ? etiology

## 2022-08-31 NOTE — Assessment & Plan Note (Signed)
Frequent urination  Exp at night  Has to wear adult undergarment   ua with wbc today-pending culture   May consider vaginal estrogen cream to help incontinence

## 2022-08-31 NOTE — Assessment & Plan Note (Signed)
Declines further treatment  Declines counseling  Grief noted from loss of husband  She has good support currently Reviewed stressors/ coping techniques/symptoms/ support sources/ tx options and side effects in detail today

## 2022-09-01 ENCOUNTER — Telehealth: Payer: Self-pay | Admitting: Family Medicine

## 2022-09-01 ENCOUNTER — Telehealth: Payer: Self-pay | Admitting: *Deleted

## 2022-09-01 DIAGNOSIS — D509 Iron deficiency anemia, unspecified: Secondary | ICD-10-CM

## 2022-09-01 DIAGNOSIS — K580 Irritable bowel syndrome with diarrhea: Secondary | ICD-10-CM

## 2022-09-01 LAB — URINE CULTURE
MICRO NUMBER:: 13987290
SPECIMEN QUALITY:: ADEQUATE

## 2022-09-01 MED ORDER — CEPHALEXIN 500 MG PO CAPS: 500.0000 mg | ORAL_CAPSULE | Freq: Two times a day (BID) | ORAL | 0 refills | Status: DC

## 2022-09-01 NOTE — Telephone Encounter (Signed)
-----   Message from Abner Greenspan, MD sent at 09/01/2022 12:45 PM EDT ----- Urine culture notes a uti (e coli)  Please send keflex 500 mg 1 po bid for 7 d #14 no refills Drink water  Let us know how you are after finishing this and how the frequency is  Hopefully this will help

## 2022-09-01 NOTE — Telephone Encounter (Signed)
Pt notified of urine cx results and Dr. Marliss Coots comments. Rx sent to pharmacy

## 2022-09-01 NOTE — Telephone Encounter (Signed)
Pt notified and [facility] phone # given to pt

## 2022-09-01 NOTE — Telephone Encounter (Signed)
Patient called in stating she was seen on Friday and she would like to go ahead and have a referral sent over to a GI doctor in [place]. Thank you!

## 2022-09-01 NOTE — Addendum Note (Signed)
Addended by: [name] A on: 09/01/2022 03:56 PM   Modules accepted: Orders

## 2022-09-01 NOTE — Telephone Encounter (Signed)
I put the referral in  It will take a while to get in  Please give her the # for [facility] in [place] to call

## 2022-09-08 ENCOUNTER — Telehealth: Payer: Self-pay | Admitting: Family Medicine

## 2022-09-08 DIAGNOSIS — K580 Irritable bowel syndrome with diarrhea: Secondary | ICD-10-CM

## 2022-09-08 NOTE — Telephone Encounter (Signed)
I put in ref to see if [facility] would be any faster  Not sure but worth a try  In the meantime if diarrhea is worse or different than her typical IBS  we may need to work it up here to start  If that is the case please follow up in the office

## 2022-09-08 NOTE — Telephone Encounter (Signed)
  Encourage patient to contact the pharmacy for refills or they can request refills through [facility]  Did the patient contact the pharmacy: No  LAST APPOINTMENT DATE: 08/29/2022  NEXT APPOINTMENT DATE: N/A  MEDICATION: cephALEXin (KEFLEX) 500 MG capsule  Is the patient out of medication? No  If not, how much is left? 1 day  PHARMACY: [place], [place]  Comment: Patient isn't sure if Dr. Glori Bickers wanted her to get it refilled but she still having symptoms.    Let patient know to contact pharmacy at the end of the day to make sure medication is ready.  Please notify patient to allow 48-72 hours to process

## 2022-09-08 NOTE — Telephone Encounter (Signed)
Pt notified of Dr. Marliss Coots comments. Pt said that she has a caregiver that drives her and she is unable to drive to [place] or [place], she has to stay local, I asked her what about [place] and she said only this side of [place] near [place] is about as far as she can go. I did advise pt that limits [place] to where she can go, pt said she is able to go to [place] or anywhere else in [place] and that's about it.

## 2022-09-08 NOTE — Telephone Encounter (Signed)
Pt called stating [facility] referred her to a GI dr & they stated the referral was just for her general check up, so they cannot see pt until Feb 2024. Office suggested for pt to ask [facility] to submit another referral so pt could be seen sooner? Pt stated she is experiencing bad diarrhea so she doesn't want to wait until Feb. 2024. Call back # [phone]

## 2022-09-08 NOTE — Telephone Encounter (Signed)
Pt called asking for update on her med refill, told pt [facility] response. Pt stated she visit soon to give urine sample. Call back # [phone]

## 2022-09-08 NOTE — Telephone Encounter (Signed)
Pt will stop by tomorrow to leave a urine sample. (Added to lab appt), pt said she has been having lower back pain, and urinary frequency FYI to PCP

## 2022-09-08 NOTE — Telephone Encounter (Signed)
See note, pt said she is still having sxs and wants another round of abx, please advise

## 2022-09-08 NOTE — Telephone Encounter (Signed)
It is very difficult to get in with GI right now regardless of what she has  Is she open to [facility] if they had earlier availability? Open to [facility]?  Would she be open to [facility] or [facility] or [facility]?  Thanks

## 2022-09-08 NOTE — Telephone Encounter (Signed)
Need to send urine for culture before we repeat if she has symptoms  Please ask what symptoms  Have her leave urine sample Thanks

## 2022-09-09 ENCOUNTER — Other Ambulatory Visit: Payer: Self-pay | Admitting: Family Medicine

## 2022-09-09 ENCOUNTER — Other Ambulatory Visit (INDEPENDENT_AMBULATORY_CARE_PROVIDER_SITE_OTHER): Payer: Medicare HMO

## 2022-09-09 DIAGNOSIS — R350 Frequency of micturition: Secondary | ICD-10-CM | POA: Diagnosis not present

## 2022-09-09 LAB — POC URINALSYSI DIPSTICK (AUTOMATED)
Bilirubin, UA: 1
Blood, UA: NEGATIVE
Glucose, UA: NEGATIVE
Leukocytes, UA: NEGATIVE
Nitrite, UA: NEGATIVE
Protein, UA: NEGATIVE
Spec Grav, UA: >=1.03 — AB (ref 1.010–1.025)
Urobilinogen, UA: 0.2 E.U./dL
pH, UA: 5.5 (ref 5.0–8.0)

## 2022-09-09 NOTE — Telephone Encounter (Signed)
Pt.notified

## 2022-09-10 LAB — URINE CULTURE
MICRO NUMBER:: 14031526
Result:: NO GROWTH
SPECIMEN QUALITY:: ADEQUATE

## 2022-10-06 ENCOUNTER — Telehealth: Payer: Self-pay | Admitting: Family Medicine

## 2022-10-06 DIAGNOSIS — D509 Iron deficiency anemia, unspecified: Secondary | ICD-10-CM

## 2022-10-06 NOTE — Telephone Encounter (Signed)
-----   Message from Ellamae Sia sent at 09/26/2022  8:34 AM EDT ----- Regarding: Lab orders for Tuesday, 11.7.23 Lab orders for 1 month return, thanks

## 2022-10-07 ENCOUNTER — Other Ambulatory Visit (INDEPENDENT_AMBULATORY_CARE_PROVIDER_SITE_OTHER): Payer: Medicare HMO

## 2022-10-07 DIAGNOSIS — D509 Iron deficiency anemia, unspecified: Secondary | ICD-10-CM

## 2022-10-07 LAB — CBC WITH DIFFERENTIAL/PLATELET
Basophils Absolute: 0.1 10*3/uL (ref 0.0–0.1)
Basophils Relative: 0.9 % (ref 0.0–3.0)
Eosinophils Absolute: 0.1 10*3/uL (ref 0.0–0.7)
Eosinophils Relative: 1 % (ref 0.0–5.0)
HCT: 34.5 % — ABNORMAL LOW (ref 36.0–46.0)
Hemoglobin: 11.6 g/dL — ABNORMAL LOW (ref 12.0–15.0)
Lymphocytes Relative: 19.7 % (ref 12.0–46.0)
Lymphs Abs: 1.3 10*3/uL (ref 0.7–4.0)
MCHC: 33.6 g/dL (ref 30.0–36.0)
MCV: 86.2 fl (ref 78.0–100.0)
Monocytes Absolute: 0.4 10*3/uL (ref 0.1–1.0)
Monocytes Relative: 6.7 % (ref 3.0–12.0)
Neutro Abs: 4.6 10*3/uL (ref 1.4–7.7)
Neutrophils Relative %: 71.7 % (ref 43.0–77.0)
Platelets: 257 10*3/uL (ref 150.0–400.0)
RBC: 4 Mil/uL (ref 3.87–5.11)
RDW: 14.8 % (ref 11.5–15.5)
WBC: 6.4 10*3/uL (ref 4.0–10.5)

## 2022-10-07 LAB — FERRITIN: Ferritin: 10.3 ng/mL (ref 10.0–291.0)

## 2022-10-07 LAB — IRON: Iron: 74 ug/dL (ref 42–145)

## 2022-10-09 ENCOUNTER — Encounter: Payer: Self-pay | Admitting: *Deleted

## 2022-10-28 ENCOUNTER — Telehealth: Payer: Self-pay | Admitting: Family Medicine

## 2022-10-28 NOTE — Telephone Encounter (Signed)
Nothing that I am aware of specifically for her.  Please let me know if she encounters any problems

## 2022-10-28 NOTE — Telephone Encounter (Signed)
Pt called stating she had the rsv shot today, 10/28/22 & she was wondering if it was fine by Dr. Glori Bickers if she got it? Pt is aware she's already gotten the shot, she was just reading up on some of the side effects & was wondering if there's anything she should e concerned about? Call back # [phone]

## 2022-10-28 NOTE — Telephone Encounter (Signed)
Chart updated, will route to PCP for review

## 2022-10-29 NOTE — Telephone Encounter (Signed)
Spoke to pt, pt stated she wasn't experiencing anything but a sore arm from the shot. Pt mentioned she had been reading info on shot, that's why she was curious. Call back # [phone].

## 2022-11-06 DIAGNOSIS — H353131 Nonexudative age-related macular degeneration, bilateral, early dry stage: Secondary | ICD-10-CM | POA: Diagnosis not present

## 2022-11-06 DIAGNOSIS — H524 Presbyopia: Secondary | ICD-10-CM | POA: Diagnosis not present

## 2022-11-06 DIAGNOSIS — H26491 Other secondary cataract, right eye: Secondary | ICD-10-CM | POA: Diagnosis not present

## 2022-11-20 DIAGNOSIS — M79674 Pain in right toe(s): Secondary | ICD-10-CM | POA: Diagnosis not present

## 2022-11-20 DIAGNOSIS — M79675 Pain in left toe(s): Secondary | ICD-10-CM | POA: Diagnosis not present

## 2022-11-20 DIAGNOSIS — B351 Tinea unguium: Secondary | ICD-10-CM | POA: Diagnosis not present

## 2022-11-20 DIAGNOSIS — G20A1 Parkinson's disease without dyskinesia, without mention of fluctuations: Secondary | ICD-10-CM | POA: Diagnosis not present

## 2022-11-27 DIAGNOSIS — H26492 Other secondary cataract, left eye: Secondary | ICD-10-CM | POA: Diagnosis not present

## 2022-12-16 DIAGNOSIS — R194 Change in bowel habit: Secondary | ICD-10-CM | POA: Diagnosis not present

## 2022-12-16 DIAGNOSIS — D509 Iron deficiency anemia, unspecified: Secondary | ICD-10-CM | POA: Diagnosis not present

## 2022-12-17 ENCOUNTER — Other Ambulatory Visit: Payer: Self-pay | Admitting: Gastroenterology

## 2022-12-17 DIAGNOSIS — D509 Iron deficiency anemia, unspecified: Secondary | ICD-10-CM

## 2022-12-17 DIAGNOSIS — R194 Change in bowel habit: Secondary | ICD-10-CM

## 2022-12-24 ENCOUNTER — Ambulatory Visit
Admission: RE | Admit: 2022-12-24 | Discharge: 2022-12-24 | Disposition: A | Discharge status: Home / Self Care | Payer: Medicare HMO | Source: Ambulatory Visit | Attending: Gastroenterology | Admitting: Gastroenterology

## 2022-12-24 DIAGNOSIS — D509 Iron deficiency anemia, unspecified: Secondary | ICD-10-CM | POA: Insufficient documentation

## 2022-12-24 DIAGNOSIS — K5730 Diverticulosis of large intestine without perforation or abscess without bleeding: Secondary | ICD-10-CM | POA: Diagnosis not present

## 2022-12-24 DIAGNOSIS — R194 Change in bowel habit: Secondary | ICD-10-CM | POA: Diagnosis not present

## 2022-12-24 DIAGNOSIS — I700 Atherosclerosis of aorta: Secondary | ICD-10-CM | POA: Insufficient documentation

## 2022-12-24 DIAGNOSIS — K8020 Calculus of gallbladder without cholecystitis without obstruction: Secondary | ICD-10-CM | POA: Diagnosis not present

## 2022-12-24 DIAGNOSIS — N281 Cyst of kidney, acquired: Secondary | ICD-10-CM | POA: Diagnosis not present

## 2022-12-24 DIAGNOSIS — K449 Diaphragmatic hernia without obstruction or gangrene: Secondary | ICD-10-CM | POA: Diagnosis not present

## 2022-12-24 DIAGNOSIS — K769 Liver disease, unspecified: Secondary | ICD-10-CM | POA: Insufficient documentation

## 2022-12-24 LAB — POCT I-STAT CREATININE: Creatinine, Ser: 1.1 mg/dL — ABNORMAL HIGH (ref 0.44–1.00)

## 2022-12-24 MED ORDER — IOHEXOL 300 MG/ML  SOLN
100.0000 mL | Freq: Once | INTRAMUSCULAR | Status: AC | PRN
  Administered 2022-12-24: 100 mL via INTRAVENOUS

## 2023-01-08 ENCOUNTER — Ambulatory Visit: Payer: Medicare HMO | Admitting: Gastroenterology

## 2023-01-09 ENCOUNTER — Telehealth: Payer: Self-pay | Admitting: Family Medicine

## 2023-01-09 NOTE — Telephone Encounter (Signed)
Stop the anti diarrhea medicine as it may cause constipation , without that iron will be better tolerated  Keep on the iron please  Thanks for the update

## 2023-01-09 NOTE — Telephone Encounter (Signed)
Called pt directly since no DPR on file, pt said that she is having issues with diarrhea not constipation. Pt will keep taking iron but she said GI is doing a full work up due to her diarrhea and they are the ones who told her to take antidiarrhea meds, since PCP wants her to take iron she will keep taking it and f/u with GI regarding GI issues.  Pt did ask for a refill of her gabapentin. Pt said Ortho filled it last but she hasn't been there is years and is asking PCP to take over   Last filled on 10/14/21 #90 caps with 3 refills  [facility]

## 2023-01-09 NOTE — Telephone Encounter (Signed)
Patient caregiver Caren Griffins called in and stated that Julie [name] is refusing to take her iron pill. She states she think the iron pill is causing her constipation but the patient is taking an antidiarrheal almost every day (but she has hid them) and getting constipated. She needs for Dr. Glori Bickers to tell the patient she need to take her iron pill to keep her energy up. Please advise. Thank you!

## 2023-01-11 MED ORDER — GABAPENTIN 300 MG PO CAPS: 300.0000 mg | ORAL_CAPSULE | Freq: Every day | ORAL | 1 refills | Status: AC

## 2023-01-11 NOTE — Telephone Encounter (Signed)
I refilled it  Continue GI care

## 2023-01-19 ENCOUNTER — Ambulatory Visit (INDEPENDENT_AMBULATORY_CARE_PROVIDER_SITE_OTHER): Payer: Medicare HMO | Admitting: Family Medicine

## 2023-01-19 ENCOUNTER — Encounter: Payer: Self-pay | Admitting: Family Medicine

## 2023-01-19 VITALS — BP 124/66 | HR 72 | Temp 98.0°F | Ht 62.0 in | Wt 171.5 lb

## 2023-01-19 DIAGNOSIS — K5790 Diverticulosis of intestine, part unspecified, without perforation or abscess without bleeding: Secondary | ICD-10-CM | POA: Diagnosis not present

## 2023-01-19 DIAGNOSIS — F4321 Adjustment disorder with depressed mood: Secondary | ICD-10-CM | POA: Diagnosis not present

## 2023-01-19 DIAGNOSIS — K8020 Calculus of gallbladder without cholecystitis without obstruction: Secondary | ICD-10-CM | POA: Diagnosis not present

## 2023-01-19 DIAGNOSIS — D509 Iron deficiency anemia, unspecified: Secondary | ICD-10-CM | POA: Diagnosis not present

## 2023-01-19 DIAGNOSIS — M810 Age-related osteoporosis without current pathological fracture: Secondary | ICD-10-CM

## 2023-01-19 DIAGNOSIS — K580 Irritable bowel syndrome with diarrhea: Secondary | ICD-10-CM

## 2023-01-19 DIAGNOSIS — L304 Erythema intertrigo: Secondary | ICD-10-CM | POA: Diagnosis not present

## 2023-01-19 DIAGNOSIS — F4320 Adjustment disorder, unspecified: Secondary | ICD-10-CM | POA: Insufficient documentation

## 2023-01-19 MED ORDER — NYSTATIN 100000 UNIT/GM EX POWD: 1.0000 | Freq: Two times a day (BID) | CUTANEOUS | 3 refills | Status: AC | PRN

## 2023-01-19 MED ORDER — ALENDRONATE SODIUM 70 MG PO TABS: 70.0000 mg | ORAL_TABLET | ORAL | 11 refills | Status: AC

## 2023-01-19 NOTE — Assessment & Plan Note (Signed)
Pt has baseline IBS symptoms but no pain  Does not seem to react to nuts or seeds

## 2023-01-19 NOTE — Assessment & Plan Note (Signed)
Intermittent red iitchy skin under breasts and pannus  She uses an antipersperant which may irritate Disc keeping area clean and dry  Sent innystatin powder to try and update

## 2023-01-19 NOTE — Progress Notes (Signed)
Subjective:    Patient ID: Julie [name], female    DOB: Apr 09, 1932, 89 y.o.   MRN: [id number]  HPI Pt presents with GI complaints  Intertrigo -under breasts and abdomen   OP- needs refill on alendronate, ran out   grief Lost husband in June  She took care of him     Wt Readings from Last 3 Encounters:  01/19/23 171 lb 8 oz (77.8 kg)  08/29/22 172 lb (78 kg)  05/29/22 175 lb (79.4 kg)   31.37 kg/m    Pt has h/o diarrhea  [facility] -rev GI note from 1/16    - dx with likely constipation with overflow diarrhea  Known h/o polyps and diverticulosis and IBS Sees PA Laurine Blazer   Thinks the benefiber helps some  Thinks she is constipated with overflow diarrhea   Problematic Eats downstairs at dining room  Hard to get to the bathroom on time \  At times also stool is very very hard / like rocks  She used vaseline once to help get stool out = last week     Sees GI   (has next appt in may)  Recommended benefiber to prevent leakage  Asked to limit coffee and soda and ice cream intake   ? If she has overflow diarrhea in the past   Takes iron for anemia   Had CT recently abd/pelv  IMPRESSION:  1. No acute findings in the abdomen or pelvis.  2. Diffuse colonic diverticular disease, most advanced in the  sigmoid segment. No evidence for a discrete colonic mass.  3. Cholelithiasis.  4. Tiny hiatal hernia.  5. Aortic Atherosclerosis (ICD10-I70.0).   Eats nuts/seeds without problems   No RUQ pain   Had colonoscopy 2011 with adenom polyp   Fam hx Mother had colon cancer, also sister and brother    Has h/o iron def  No known cause  Lab Results  Component Value Date   WBC 6.4 10/07/2022   HGB 11.6 (L) 10/07/2022   HCT 34.5 (L) 10/07/2022   MCV 86.2 10/07/2022   PLT 257.0 10/07/2022   Ferrex is ordered every other day    Patient Active Problem List   Diagnosis Date Noted   Intertrigo 01/19/2023   Gallstones 01/19/2023   Grief reaction  01/19/2023   Overactive bladder 08/29/2022   Anxiety and depression 03/31/2022   Iron deficiency anemia 03/07/2022   Estrogen deficiency 01/22/2022   Rhinorrhea 01/22/2022   Right arm pain 04/17/2021   Headache 04/17/2021   Fatigue 04/17/2021   Urge incontinence 01/22/2021   Pedal edema 01/22/2021   Thoracic back pain 10/23/2020   Mobility impaired 05/14/2020   Spinal stenosis of lumbar region 02/08/2020   Urinary frequency 02/08/2020   Encounter for screening mammogram for breast cancer 08/24/2017   Parkinsonism 08/24/2017   Routine general medical examination at a health care facility 08/08/2016   Left knee pain 02/01/2016   Total knee replacement status 10/15/2015   B12 deficiency 07/16/2015   Peripheral neuropathy 07/16/2015   Chorea 07/16/2015   Colon cancer screening 09/22/2014   Osteoarthritis of left lower extremity 09/09/2014   History of falling 04/26/2014   Poor balance 04/26/2014   Encounter for Medicare annual wellness exam 07/13/2013   Risk for falls 07/13/2013   Weakness of left leg 07/30/2011   BACK PAIN, LUMBAR 12/10/2010   Vitamin D deficiency 08/16/2010   GERD 03/26/2010   Irritable bowel syndrome 03/26/2010   Osteoporosis 08/27/2009   Hypertriglyceridemia 07/24/2009  Diverticulosis 07/24/2009   OSTEOARTHRITIS 07/24/2009   COLONIC POLYPS, HX OF 07/24/2009   POSTMENOPAUSAL STATUS 07/24/2009   Past Medical History:  Diagnosis Date   Adenoma    Chronic leg pain    Colon polyp    DDD (degenerative disc disease), cervical    Diverticulosis of colon    internal----Dr. Vira Agar   Duodenitis    Esophagitis    GERD (gastroesophageal reflux disease)    Hemorrhoids    HLD (hyperlipidemia)    IBS (irritable bowel syndrome)    with PP diarrhea   Neuropathy    Neuropathy    OA (osteoarthritis) of knee    Osteopenia    PONV (postoperative nausea and vomiting)    Scoliosis    Seborrheic dermatitis    Shortness of breath dyspnea    with activity    TMJ syndrome    Trochanteric bursitis    Unsteady gait    FALLS EASILY   Past Surgical History:  Procedure Laterality Date   CARPAL TUNNEL RELEASE Left 01/18/2013   Procedure: CARPAL TUNNEL RELEASE;  Surgeon: Wynonia Sours, MD;  Location: [facility];  Service: Orthopedics;  Laterality: Left;  OSTEOTOMY LEFT DISTAL RADIUS BONE CHIPS CARPAL TUNNEL RELEASE LEFT    CATARACT EXTRACTION W/PHACO Right 08/07/2015   Procedure: CATARACT EXTRACTION PHACO AND INTRAOCULAR LENS PLACEMENT ([place]);  Surgeon: Birder Robson, MD;  Location: [facility];  Service: Ophthalmology;  Laterality: Right;  [place] 00:48.0 AP   22.3 CDE 10.69 casette lot # [id number]   CATARACT EXTRACTION W/PHACO Left 09/04/2015   Procedure: CATARACT EXTRACTION PHACO AND INTRAOCULAR LENS PLACEMENT ([facility]);  Surgeon: Birder Robson, MD;  Location: [facility];  Service: Ophthalmology;  Laterality: Left;  [place] AP CDE FLUID LOT # [id number]   CEREBRAL ANEURYSM REPAIR  2001   clamps   COLONOSCOPY  1/11   polyps-hyperplastic and adenomatous   FOOT SURGERY  2010   bilateral hammer toes and "knot"   FRACTURE SURGERY Right 01/2014   result of a fall   FRACTURE SURGERY Left 03/2014   result of a fall   HEMORRHOID SURGERY     KNEE ARTHROPLASTY Left 10/15/2015   Procedure: COMPUTER ASSISTED TOTAL KNEE ARTHROPLASTY;  Surgeon: Dereck Leep, MD;  Location: [facility];  Service: Orthopedics;  Laterality: Left;   OPEN REDUCTION INTERNAL FIXATION ([facility]) DISTAL RADIAL FRACTURE Right 02/13/2015   Procedure: OPEN REDUCTION INTERNAL FIXATION (ORIF) RIGHT DISTAL RADIUS;  Surgeon: Daryll Brod, MD;  Location: [facility];  Service: Orthopedics;  Laterality: Right;   OPEN REDUCTION INTERNAL FIXATION ([facility]IF) FINGER WITH RADIAL BONE GRAFT Left 11/01/2013   Procedure: OPEN REDUCTION INTERNAL FIXATION (ORIF) LEFT SMALL FINGER;  Surgeon: Wynonia Sours, MD;  Location: [facility];  Service: Orthopedics;  Laterality: Left;   REPLACEMENT  TOTAL KNEE Left    TOTAL ABDOMINAL HYSTERECTOMY     WRIST OSTEOTOMY Left 01/18/2013   Procedure: WRIST OSTEOTOMY;  Surgeon: Wynonia Sours, MD;  Location: [facility];  Service: Orthopedics;  Laterality: Left;   Social History   Tobacco Use   Smoking status: Former    Types: Cigarettes    Quit date: 10/02/1954    Years since quitting: 68.3   Smokeless tobacco: Never  Vaping Use   Vaping Use: Never used  Substance Use Topics   Alcohol use: No    Alcohol/week: 0.0 standard drinks of alcohol   Drug use: No   Family History  Problem Relation Age of Onset  Colon cancer Mother    Osteoporosis Other        hip fracture   Stroke Brother    Colon cancer Brother    Hypertension Brother    Other Brother        Heart problem   Colon cancer Brother    Colon cancer Maternal Aunt    Breast cancer Neg Hx    Allergies  Allergen Reactions   Codeine     REACTION: headache and nausea and vomiting   Current Outpatient Medications on File Prior to Visit  Medication Sig Dispense Refill   acetaminophen (TYLENOL) 500 MG tablet Take 500 mg by mouth every 6 (six) hours as needed.     Alum Hydroxide-Mag Carbonate 160-105 MG CHEW Chew 1 tablet by mouth as needed (acid reflux).     Cholecalciferol (SM VITAMIN D3) 100 MCG (4000 UT) CAPS Take 4,000 Units by mouth daily.     FERREX 150 150 MG capsule TAKE 1 CAPSULE BY MOUTH ONCE DAILY 30 capsule 3   gabapentin (NEURONTIN) 300 MG capsule Take 1 capsule (300 mg total) by mouth at bedtime. 90 capsule 1   vitamin B-12 (CYANOCOBALAMIN) 1000 MCG tablet Take 1,000 mcg by mouth daily.     No current facility-administered medications on file prior to visit.     Review of Systems  Constitutional:  Positive for fatigue. Negative for activity change, appetite change, fever and unexpected weight change.  HENT:  Negative for congestion, ear pain, rhinorrhea, sinus pressure and sore throat.   Eyes:  Negative for pain, redness and visual disturbance.   Respiratory:  Negative for cough, shortness of breath and wheezing.   Cardiovascular:  Negative for chest pain and palpitations.  Gastrointestinal:  Positive for constipation and diarrhea. Negative for abdominal pain, anal bleeding, blood in stool, nausea, rectal pain and vomiting.  Endocrine: Negative for polydipsia and polyuria.  Genitourinary:  Negative for dysuria, frequency and urgency.  Musculoskeletal:  Negative for arthralgias, back pain and myalgias.  Skin:  Positive for rash. Negative for pallor.  Allergic/Immunologic: Negative for environmental allergies.  Neurological:  Negative for dizziness, syncope and headaches.  Hematological:  Negative for adenopathy. Does not bruise/bleed easily.  Psychiatric/Behavioral:  Positive for dysphoric mood. Negative for decreased concentration. The patient is not nervous/anxious.        Objective:   Physical Exam Constitutional:      General: She is not in acute distress.    Appearance: Normal appearance. She is well-developed. She is obese. She is not ill-appearing or diaphoretic.  HENT:     Head: Normocephalic and atraumatic.  Eyes:     Conjunctiva/sclera: Conjunctivae normal.     Pupils: Pupils are equal, round, and reactive to light.  Neck:     Thyroid: No thyromegaly.     Vascular: No carotid bruit or JVD.  Cardiovascular:     Rate and Rhythm: Normal rate and regular rhythm.     Heart sounds: Normal heart sounds.     No gallop.  Pulmonary:     Effort: Pulmonary effort is normal. No respiratory distress.     Breath sounds: Normal breath sounds. No wheezing or rales.  Abdominal:     General: Abdomen is protuberant. Bowel sounds are normal. There is no distension or abdominal bruit.     Palpations: Abdomen is soft. There is no fluid wave, hepatomegaly, splenomegaly, mass or pulsatile mass.     Tenderness: There is no abdominal tenderness. There is no right CVA tenderness, left CVA tenderness,  guarding or rebound. Negative signs  include Murphy's sign and McBurney's sign.     Hernia: No hernia is present.  Musculoskeletal:     Cervical back: Normal range of motion and neck supple.     Right lower leg: No edema.     Left lower leg: No edema.  Lymphadenopathy:     Cervical: No cervical adenopathy.  Skin:    General: Skin is warm and dry.     Coloration: Skin is not pale.     Findings: No rash.  Neurological:     Mental Status: She is alert.     Coordination: Coordination normal.     Deep Tendon Reflexes: Reflexes are normal and symmetric. Reflexes normal.     Comments: Generally weak, needs help rising from chair Uses walker  No acute neuro changes   Psychiatric:        Attention and Perception: Attention normal.        Mood and Affect: Mood is depressed. Affect is tearful.        Speech: Speech normal.        Behavior: Behavior normal.        Cognition and Memory: Cognition and memory normal.     Comments: Mentally sharp Candidly discusses symptoms and stressors             Assessment & Plan:   Problem List Items Addressed This Visit       Digestive   Diverticulosis    Pt has baseline IBS symptoms but no pain  Does not seem to react to nuts or seeds      Gallstones    Incidental finding on CT scan recently  No pain in Ruq  Will monitor -disc s/s to watch for       Irritable bowel syndrome - Primary    Most recently dealing with diarrhea-this is urgent after meals and keeps her from eating out or eating in dining room  At other times constipation  Takes iron  Takes immodium once daily  Suggest f/u with GI (reviewed last notes and CT result)  Referral done to see if we can get her in sooner  I agree with plan to avoid caffeine/soda/ dairy if bothersome  and to continue benefiber   Afraid that immodium could add to overflow constipation  She denies pain except right before bm         Relevant Orders   Ambulatory referral to Gastroenterology     Musculoskeletal and Integument    Intertrigo    Intermittent red iitchy skin under breasts and pannus  She uses an antipersperant which may irritate Disc keeping area clean and dry  Sent innystatin powder to try and update       Osteoporosis    Ran out of alendronate Seems to tolerate Do recent fx Sent to pharamcy      Relevant Medications   alendronate (FOSAMAX) 70 MG tablet     Other   Grief reaction    Struggling after loss of husband this summer  Trying to socialize at her residence  Offered counseling referral-she will call if interested later      Iron deficiency anemia    Takes iron every other day Has more diarrhea lately as opposed to constipation  (but that can be a problem also) Last Hb 11.6

## 2023-01-19 NOTE — Assessment & Plan Note (Signed)
Incidental finding on CT scan recently  No pain in Ruq  Will monitor -disc s/s to watch for

## 2023-01-19 NOTE — Assessment & Plan Note (Signed)
Ran out of alendronate Seems to tolerate Do recent fx Sent to [employer]

## 2023-01-19 NOTE — Assessment & Plan Note (Signed)
Struggling after loss of husband this summer  Trying to socialize at her residence  Offered counseling referral-she will call if interested later

## 2023-01-19 NOTE — Assessment & Plan Note (Signed)
Most recently dealing with diarrhea-this is urgent after meals and keeps her from eating out or eating in dining room  At other times constipation  Takes iron  Takes immodium once daily  Suggest f/u with GI (reviewed last notes and CT result)  Referral done to see if we can get her in sooner  I agree with plan to avoid caffeine/soda/ dairy if bothersome  and to continue benefiber   Afraid that immodium could add to overflow constipation  She denies pain except right before bm

## 2023-01-19 NOTE — Assessment & Plan Note (Signed)
Takes iron every other day Has more diarrhea lately as opposed to constipation  (but that can be a problem also) Last Hb 11.6

## 2023-01-19 NOTE — Patient Instructions (Addendum)
Continue the benefiber twice daily   You can use the immodium once daily if it helps (no more that that) If you think it adds to the constipation then stop it   Hold the iron on the days you are constipation    I will put a referral in to see if we can get you into GI any earlier than May  If you don't get a call in 1-2 weeks let us know   If you want to see mental health counselor in the future for grief please let us know    Do continue to cut back on dairy products and coffee

## 2023-01-22 ENCOUNTER — Telehealth: Payer: Self-pay | Admitting: *Deleted

## 2023-01-23 ENCOUNTER — Other Ambulatory Visit (HOSPITAL_COMMUNITY): Payer: Self-pay

## 2023-01-23 NOTE — Telephone Encounter (Signed)
Patient Advocate Encounter  Prior Authorization for Nystatin 100000 UNIT/GM powder has been approved.    PA# [id number]  Effective dates: 12/01/22 through 12/01/23

## 2023-01-23 NOTE — Telephone Encounter (Signed)
Pharmacy Patient Advocate Encounter   Received notification from [facility] that prior authorization for Nystatin 100000 UNIT/GM powder is required/requested.   PA submitted on 01/23/23 to (ins) [employer] via CoverMyMeds Key [id number]  Status is pending

## 2023-01-23 NOTE — Telephone Encounter (Signed)
Called and let [facility] know PA was approved

## 2023-01-23 NOTE — Telephone Encounter (Signed)
Yes, it is for cutaneous candidiasis  If they won't cover it then may need to try for something else

## 2023-01-23 NOTE — Telephone Encounter (Signed)
Submitting PA via CMM. Stopped on this question. Please advise.

## 2023-01-26 ENCOUNTER — Telehealth: Payer: Self-pay | Admitting: Family Medicine

## 2023-01-26 NOTE — Telephone Encounter (Signed)
Called patient advised what medication is used for. Will call if any further questions.

## 2023-01-26 NOTE — Telephone Encounter (Signed)
Pt called in requesting a call back would like to know what RX alendronate (FOSAMAX) 70 MG tablet  is use  for . Please advise [phone]

## 2023-02-17 ENCOUNTER — Encounter: Payer: Self-pay | Admitting: Family Medicine

## 2023-02-17 ENCOUNTER — Ambulatory Visit (INDEPENDENT_AMBULATORY_CARE_PROVIDER_SITE_OTHER): Payer: Medicare HMO | Admitting: Family Medicine

## 2023-02-17 VITALS — BP 144/80 | HR 78 | Temp 98.3°F | Ht 62.0 in | Wt 176.0 lb

## 2023-02-17 DIAGNOSIS — R350 Frequency of micturition: Secondary | ICD-10-CM | POA: Diagnosis not present

## 2023-02-17 DIAGNOSIS — N3000 Acute cystitis without hematuria: Secondary | ICD-10-CM | POA: Diagnosis not present

## 2023-02-17 DIAGNOSIS — N3281 Overactive bladder: Secondary | ICD-10-CM

## 2023-02-17 LAB — POC URINALSYSI DIPSTICK (AUTOMATED)
Bilirubin, UA: 2
Blood, UA: NEGATIVE
Glucose, UA: NEGATIVE
Ketones, UA: 5
Nitrite, UA: NEGATIVE
Protein, UA: NEGATIVE
Spec Grav, UA: 1.025 (ref 1.010–1.025)
Urobilinogen, UA: 1 E.U./dL
pH, UA: 6 (ref 5.0–8.0)

## 2023-02-17 LAB — POCT UA - MICROSCOPIC ONLY

## 2023-02-17 MED ORDER — CEPHALEXIN 500 MG PO CAPS: 500.0000 mg | ORAL_CAPSULE | Freq: Two times a day (BID) | ORAL | 0 refills | Status: DC

## 2023-02-17 NOTE — Assessment & Plan Note (Signed)
Wears undergarment  Worse recently and some discomfort  Thinks poss uti - cx pending

## 2023-02-17 NOTE — Assessment & Plan Note (Signed)
Today small leuk on ua  May be a it concentrated  Started keflex and enc fluids  Cx pending

## 2023-02-17 NOTE — Progress Notes (Signed)
Subjective:    Patient ID: Julie [name], female    DOB: [birth year], 89 y.o.   MRN: [id number]  HPI Pt presents for urinary symptoms and low back pain   Wt Readings from Last 3 Encounters:  02/17/23 176 lb (79.8 kg)  01/19/23 171 lb 8 oz (77.8 kg)  08/29/22 172 lb (78 kg)   32.19 kg/m  Vitals:   02/17/23 1513  BP: (!) 144/80  Pulse: 78  Temp: 98.3 F (36.8 C)  SpO2: 95%    Lower back pain   Urinary frequency  Pt does have a h/o overactive bladder and urinary incontinence/wears an adult undergarment for that  More of an odor recently - not her usual  She thinks she smells like medicine   Urine looked darker in her diaper recently  Did not look like blood   No n/v    Her low back hurts a lot  Hurts to get in and out of bed   Urinates frequently at night  Had a hysterectomy in the past  No vaginal bleeding   Her IBS is a little better   Ua today  Results for orders placed or performed in visit on 02/17/23  POCT Urinalysis Dipstick (Automated)  Result Value Ref Range   Color, UA Yellow    Clarity, UA Cloudy    Glucose, UA Negative Negative   Bilirubin, UA 2 mg/dL    Ketones, UA 5 mg/dL    Spec Grav, UA 1.025 1.010 - 1.025   Blood, UA Negative    pH, UA 6.0 5.0 - 8.0   Protein, UA Negative Negative   Urobilinogen, UA 1.0 0.2 or 1.0 E.U./dL   Nitrite, UA Negative    Leukocytes, UA Small (1+) (A) Negative  POCT UA - Microscopic Only  Result Value Ref Range   WBC, Ur, HPF, POC many 0 - 5   RBC, Urine, Miroscopic none 0 - 2   Bacteria, U Microscopic many None - Trace   Mucus, UA few    Epithelial cells, urine per micros mod    Crystals, Ur, HPF, POC none    Casts, Ur, LPF, POC none    Yeast, UA none     Lab Results  Component Value Date   CREATININE 1.10 (H) 12/24/2022   BUN 11 08/29/2022   NA 139 08/29/2022   K 3.7 08/29/2022   CL 105 08/29/2022   CO2 24 08/29/2022    Patient Active Problem List   Diagnosis Date Noted   Acute cystitis  02/17/2023   Intertrigo 01/19/2023   Gallstones 01/19/2023   Grief reaction 01/19/2023   Overactive bladder 08/29/2022   Anxiety and depression 03/31/2022   Iron deficiency anemia 03/07/2022   Estrogen deficiency 01/22/2022   Rhinorrhea 01/22/2022   Right arm pain 04/17/2021   Headache 04/17/2021   Fatigue 04/17/2021   Urge incontinence 01/22/2021   Pedal edema 01/22/2021   Thoracic back pain 10/23/2020   Mobility impaired 05/14/2020   Spinal stenosis of lumbar region 02/08/2020   Urinary frequency 02/08/2020   Encounter for screening mammogram for breast cancer 08/24/2017   Parkinsonism 08/24/2017   Routine general medical examination at a health care facility 08/08/2016   Left knee pain 02/01/2016   Total knee replacement status 10/15/2015   B12 deficiency 07/16/2015   Peripheral neuropathy 07/16/2015   Chorea 07/16/2015   Colon cancer screening 09/22/2014   Osteoarthritis of left lower extremity 09/09/2014   History of falling 04/26/2014   Poor balance 04/26/2014  Encounter for Medicare annual wellness exam 07/13/2013   Risk for falls 07/13/2013   Weakness of left leg 07/30/2011   BACK PAIN, LUMBAR 12/10/2010   Vitamin D deficiency 08/16/2010   GERD 03/26/2010   Irritable bowel syndrome 03/26/2010   Osteoporosis 08/27/2009   Hypertriglyceridemia 07/24/2009   Diverticulosis 07/24/2009   OSTEOARTHRITIS 07/24/2009   COLONIC POLYPS, HX OF 07/24/2009   POSTMENOPAUSAL STATUS 07/24/2009   Past Medical History:  Diagnosis Date   Adenoma    Chronic leg pain    Colon polyp    DDD (degenerative disc disease), cervical    Diverticulosis of colon    internal----Dr. Vira Agar   Duodenitis    Esophagitis    GERD (gastroesophageal reflux disease)    Hemorrhoids    HLD (hyperlipidemia)    IBS (irritable bowel syndrome)    with PP diarrhea   Neuropathy    Neuropathy    OA (osteoarthritis) of knee    Osteopenia    PONV (postoperative nausea and vomiting)    Scoliosis     Seborrheic dermatitis    Shortness of breath dyspnea    with activity   TMJ syndrome    Trochanteric bursitis    Unsteady gait    FALLS EASILY   Past Surgical History:  Procedure Laterality Date   CARPAL TUNNEL RELEASE Left 01/18/2013   Procedure: CARPAL TUNNEL RELEASE;  Surgeon: Wynonia Sours, MD;  Location: [place];  Service: Orthopedics;  Laterality: Left;  OSTEOTOMY LEFT DISTAL RADIUS BONE CHIPS CARPAL TUNNEL RELEASE LEFT    CATARACT EXTRACTION W/PHACO Right 08/07/2015   Procedure: CATARACT EXTRACTION PHACO AND INTRAOCULAR LENS PLACEMENT ([place]);  Surgeon: Birder Robson, MD;  Location: [facility];  Service: Ophthalmology;  Laterality: Right;  [place] 00:48.0 AP   22.3 CDE 10.69 casette lot # [id number]   CATARACT EXTRACTION W/PHACO Left 09/04/2015   Procedure: CATARACT EXTRACTION PHACO AND INTRAOCULAR LENS PLACEMENT (IOC);  Surgeon: Birder Robson, MD;  Location: [facility];  Service: Ophthalmology;  Laterality: Left;  [place] AP CDE FLUID LOT # [id number]   CEREBRAL ANEURYSM REPAIR  2001   clamps   COLONOSCOPY  1/11   polyps-hyperplastic and adenomatous   FOOT SURGERY  2010   bilateral hammer toes and "knot"   FRACTURE SURGERY Right 01/2014   result of a fall   FRACTURE SURGERY Left 03/2014   result of a fall   HEMORRHOID SURGERY     KNEE ARTHROPLASTY Left 10/15/2015   Procedure: COMPUTER ASSISTED TOTAL KNEE ARTHROPLASTY;  Surgeon: Dereck Leep, MD;  Location: [facility];  Service: Orthopedics;  Laterality: Left;   OPEN REDUCTION INTERNAL FIXATION (ORIF) DISTAL RADIAL FRACTURE Right 02/13/2015   Procedure: OPEN REDUCTION INTERNAL FIXATION (ORIF) RIGHT DISTAL RADIUS;  Surgeon: Daryll Brod, MD;  Location: [place];  Service: Orthopedics;  Laterality: Right;   OPEN REDUCTION INTERNAL FIXATION (ORIF) FINGER WITH RADIAL BONE GRAFT Left 11/01/2013   Procedure: OPEN REDUCTION INTERNAL FIXATION (ORIF) LEFT SMALL FINGER;  Surgeon: Wynonia Sours, MD;  Location: [place];  Service: Orthopedics;  Laterality: Left;   REPLACEMENT TOTAL KNEE Left    TOTAL ABDOMINAL HYSTERECTOMY     WRIST OSTEOTOMY Left 01/18/2013   Procedure: WRIST OSTEOTOMY;  Surgeon: Wynonia Sours, MD;  Location: [place];  Service: Orthopedics;  Laterality: Left;   Social History   Tobacco Use   Smoking status: Former    Types: Cigarettes    Quit date: 10/02/1954  Years since quitting: 68.4   Smokeless tobacco: Never  Vaping Use   Vaping Use: Never used  Substance Use Topics   Alcohol use: No    Alcohol/week: 0.0 standard drinks of alcohol   Drug use: No   Family History  Problem Relation Age of Onset   Colon cancer Mother    Osteoporosis Other        hip fracture   Stroke Brother    Colon cancer Brother    Hypertension Brother    Other Brother        Heart problem   Colon cancer Brother    Colon cancer Maternal Aunt    Breast cancer Neg Hx    Allergies  Allergen Reactions   Codeine     REACTION: headache and nausea and vomiting   Current Outpatient Medications on File Prior to Visit  Medication Sig Dispense Refill   acetaminophen (TYLENOL) 500 MG tablet Take 500 mg by mouth every 6 (six) hours as needed.     alendronate (FOSAMAX) 70 MG tablet Take 1 tablet (70 mg total) by mouth every 7 (seven) days. Take with a full glass of water on an empty stomach. 4 tablet 11   Alum Hydroxide-Mag Carbonate 160-105 MG CHEW Chew 1 tablet by mouth as needed (acid reflux).     Cholecalciferol (SM VITAMIN D3) 100 MCG (4000 UT) CAPS Take 4,000 Units by mouth daily.     FERREX 150 150 MG capsule TAKE 1 CAPSULE BY MOUTH ONCE DAILY 30 capsule 3   gabapentin (NEURONTIN) 300 MG capsule Take 1 capsule (300 mg total) by mouth at bedtime. 90 capsule 1   nystatin (MYCOSTATIN/NYSTOP) powder Apply 1 Application topically 2 (two) times daily as needed. To affected area under breasts and abdomen 30 g 3   vitamin B-12 (CYANOCOBALAMIN) 1000 MCG tablet Take 1,000 mcg by  mouth daily.     No current facility-administered medications on file prior to visit.     Review of Systems  Constitutional:  Positive for fatigue. Negative for activity change, appetite change and fever.  HENT:  Negative for congestion and sore throat.   Eyes:  Negative for itching and visual disturbance.  Respiratory:  Negative for cough and shortness of breath.   Cardiovascular:  Negative for leg swelling.  Gastrointestinal:  Negative for abdominal distention, abdominal pain, constipation, diarrhea and nausea.  Endocrine: Negative for cold intolerance and polydipsia.  Genitourinary:  Positive for dysuria, frequency and urgency. Negative for decreased urine volume, difficulty urinating, flank pain, hematuria, pelvic pain, vaginal bleeding, vaginal discharge and vaginal pain.  Musculoskeletal:  Positive for back pain. Negative for myalgias.       Poor mobility   Skin:  Negative for rash.  Allergic/Immunologic: Negative for immunocompromised state.  Neurological:  Negative for dizziness and weakness.  Hematological:  Negative for adenopathy.       Objective:   Physical Exam Constitutional:      General: She is not in acute distress.    Appearance: Normal appearance. She is well-developed. She is obese. She is not ill-appearing or diaphoretic.     Comments: In sitting walker   HENT:     Head: Normocephalic and atraumatic.  Eyes:     General: No scleral icterus.    Conjunctiva/sclera: Conjunctivae normal.     Pupils: Pupils are equal, round, and reactive to light.  Cardiovascular:     Rate and Rhythm: Normal rate and regular rhythm.     Heart sounds: Normal heart sounds.  Pulmonary:     Effort: Pulmonary effort is normal.     Breath sounds: Normal breath sounds.  Abdominal:     General: Bowel sounds are normal. There is no distension.     Palpations: Abdomen is soft.     Tenderness: There is abdominal tenderness. There is no guarding or rebound.     Comments: No cva  tenderness  No suprapubic tenderness or fullness     Musculoskeletal:     Cervical back: Normal range of motion and neck supple.     Comments: Rom of LS is limited  Gait is slow with walker   Lymphadenopathy:     Cervical: No cervical adenopathy.  Skin:    Coloration: Skin is not pale.     Findings: No rash.  Neurological:     Mental Status: She is alert.  Psychiatric:        Mood and Affect: Mood normal.           Assessment & Plan:   Problem List Items Addressed This Visit       Genitourinary   Acute cystitis - Primary    In pt with baseline overactive bladder and incontinence   Today notes inc low back pain (not flank) More frequency and urgency and some discomfort  Some darker urine in diaper  A different smell (though pt thinks this is her body and not urine) Ua notes small euk and some bilirubin, is also mildly concentrated  Uc x pending  Enc fluids- more water /aim for 60 oz if possible Keflex 500 mg bid for 7 d  (pending culture result) Rev ER precautions  Continue to monitor       Relevant Orders   Urine Culture   Overactive bladder    Wears undergarment  Worse recently and some discomfort  Thinks poss uti - cx pending         Other   Urinary frequency    Today small leuk on ua  May be a it concentrated  Started keflex and enc fluids  Cx pending       Relevant Orders   POCT Urinalysis Dipstick (Automated) (Completed)

## 2023-02-17 NOTE — Patient Instructions (Addendum)
You have an appt with [facility] on may 13th   If you need to call them   [facility]  [phone]    Drink lots of water - aim for 60 oz per day if you can This is for bladder and kidney health   You may have a uti  Take the generic keflex as directed  We will contact you with the culture result and plan

## 2023-02-17 NOTE — Assessment & Plan Note (Signed)
In pt with baseline overactive bladder and incontinence   Today notes inc low back pain (not flank) More frequency and urgency and some discomfort  Some darker urine in diaper  A different smell (though pt thinks this is her body and not urine) Ua notes small euk and some bilirubin, is also mildly concentrated  Uc x pending  Enc fluids- more water /aim for 60 oz if possible Keflex 500 mg bid for 7 d  (pending culture result) Rev ER precautions  Continue to monitor

## 2023-02-18 LAB — URINE CULTURE
MICRO NUMBER:: 14711640
Result:: NO GROWTH
SPECIMEN QUALITY:: ADEQUATE

## 2023-02-19 ENCOUNTER — Telehealth: Payer: Self-pay

## 2023-02-19 NOTE — Telephone Encounter (Signed)
[place] Night - Client Nonclinical Telephone Record  [facility] Client [place] Night - Client Client Site [place] Provider [name] - MD Contact Type Call Who Is Calling Patient / Member / Family / Caregiver Caller Name Terell Lubrano Caller Phone Number [phone] Call Type Message Only Information Provided Reason for Call Returning a Call from the Office Initial Gates is returning a call to the office. Additional Comment Provide office hours. Disp. Time Disposition Final User 02/19/2023 4:04:03 PM General Information Provided Yes Jacinta Shoe Call Closed By: Jacinta Shoe Transaction Date/Time: 02/19/2023 4:02:06 PM (ET

## 2023-02-20 NOTE — Telephone Encounter (Signed)
Patient returned call regarding he lab results. I relayed the message to her regarding her labs.She said she would not like to see an urologist right now. She waiting to see the gastrologist first.

## 2023-03-06 ENCOUNTER — Ambulatory Visit: Payer: Medicare HMO | Admitting: Family Medicine

## 2023-04-08 ENCOUNTER — Other Ambulatory Visit: Payer: Self-pay | Admitting: Family Medicine

## 2023-04-08 NOTE — Telephone Encounter (Signed)
If she takes it once daily and got 90 with a refill / that should be 6 months right? Does she have a refill left?  Let me know if I'm wrong Thanks

## 2023-04-08 NOTE — Telephone Encounter (Signed)
Refill request Gabapentin Last refill 01/11/23 #90/1 Last office visit 02/17/23

## 2023-04-11 ENCOUNTER — Inpatient Hospital Stay
Admission: EM | Admit: 2023-04-11 | Discharge: 2023-04-16 | DRG: 481 | Disposition: A | Discharge status: Skilled Nursing Facility | Payer: Medicare HMO | Attending: Internal Medicine | Admitting: Internal Medicine

## 2023-04-11 ENCOUNTER — Emergency Department: Payer: Medicare HMO

## 2023-04-11 ENCOUNTER — Encounter: Payer: Self-pay | Admitting: Emergency Medicine

## 2023-04-11 ENCOUNTER — Other Ambulatory Visit: Payer: Self-pay

## 2023-04-11 DIAGNOSIS — M542 Cervicalgia: Secondary | ICD-10-CM | POA: Diagnosis not present

## 2023-04-11 DIAGNOSIS — R410 Disorientation, unspecified: Secondary | ICD-10-CM | POA: Diagnosis not present

## 2023-04-11 DIAGNOSIS — S72009A Fracture of unspecified part of neck of unspecified femur, initial encounter for closed fracture: Secondary | ICD-10-CM | POA: Diagnosis present

## 2023-04-11 DIAGNOSIS — F05 Delirium due to known physiological condition: Secondary | ICD-10-CM | POA: Diagnosis not present

## 2023-04-11 DIAGNOSIS — Z7983 Long term (current) use of bisphosphonates: Secondary | ICD-10-CM

## 2023-04-11 DIAGNOSIS — I9581 Postprocedural hypotension: Secondary | ICD-10-CM | POA: Diagnosis not present

## 2023-04-11 DIAGNOSIS — M5030 Other cervical disc degeneration, unspecified cervical region: Secondary | ICD-10-CM | POA: Diagnosis present

## 2023-04-11 DIAGNOSIS — I959 Hypotension, unspecified: Secondary | ICD-10-CM

## 2023-04-11 DIAGNOSIS — Z9842 Cataract extraction status, left eye: Secondary | ICD-10-CM | POA: Diagnosis not present

## 2023-04-11 DIAGNOSIS — Z96652 Presence of left artificial knee joint: Secondary | ICD-10-CM | POA: Diagnosis present

## 2023-04-11 DIAGNOSIS — D62 Acute posthemorrhagic anemia: Secondary | ICD-10-CM | POA: Diagnosis not present

## 2023-04-11 DIAGNOSIS — F0280 Dementia in other diseases classified elsewhere without behavioral disturbance: Secondary | ICD-10-CM | POA: Diagnosis not present

## 2023-04-11 DIAGNOSIS — G629 Polyneuropathy, unspecified: Secondary | ICD-10-CM | POA: Diagnosis present

## 2023-04-11 DIAGNOSIS — K589 Irritable bowel syndrome without diarrhea: Secondary | ICD-10-CM | POA: Diagnosis present

## 2023-04-11 DIAGNOSIS — Z9841 Cataract extraction status, right eye: Secondary | ICD-10-CM

## 2023-04-11 DIAGNOSIS — R9431 Abnormal electrocardiogram [ECG] [EKG]: Secondary | ICD-10-CM | POA: Diagnosis not present

## 2023-04-11 DIAGNOSIS — G20C Parkinsonism, unspecified: Secondary | ICD-10-CM | POA: Diagnosis present

## 2023-04-11 DIAGNOSIS — Z961 Presence of intraocular lens: Secondary | ICD-10-CM | POA: Diagnosis not present

## 2023-04-11 DIAGNOSIS — W19XXXD Unspecified fall, subsequent encounter: Secondary | ICD-10-CM | POA: Diagnosis not present

## 2023-04-11 DIAGNOSIS — Z86010 Personal history of colonic polyps: Secondary | ICD-10-CM

## 2023-04-11 DIAGNOSIS — W1809XA Striking against other object with subsequent fall, initial encounter: Secondary | ICD-10-CM | POA: Diagnosis present

## 2023-04-11 DIAGNOSIS — E785 Hyperlipidemia, unspecified: Secondary | ICD-10-CM | POA: Diagnosis present

## 2023-04-11 DIAGNOSIS — Y92009 Unspecified place in unspecified non-institutional (private) residence as the place of occurrence of the external cause: Secondary | ICD-10-CM | POA: Diagnosis not present

## 2023-04-11 DIAGNOSIS — Z8249 Family history of ischemic heart disease and other diseases of the circulatory system: Secondary | ICD-10-CM

## 2023-04-11 DIAGNOSIS — N1831 Chronic kidney disease, stage 3a: Secondary | ICD-10-CM | POA: Insufficient documentation

## 2023-04-11 DIAGNOSIS — M8580 Other specified disorders of bone density and structure, unspecified site: Secondary | ICD-10-CM | POA: Diagnosis present

## 2023-04-11 DIAGNOSIS — W19XXXA Unspecified fall, initial encounter: Secondary | ICD-10-CM | POA: Diagnosis not present

## 2023-04-11 DIAGNOSIS — I9589 Other hypotension: Secondary | ICD-10-CM | POA: Diagnosis not present

## 2023-04-11 DIAGNOSIS — I129 Hypertensive chronic kidney disease with stage 1 through stage 4 chronic kidney disease, or unspecified chronic kidney disease: Secondary | ICD-10-CM | POA: Diagnosis not present

## 2023-04-11 DIAGNOSIS — R710 Precipitous drop in hematocrit: Secondary | ICD-10-CM

## 2023-04-11 DIAGNOSIS — Z885 Allergy status to narcotic agent status: Secondary | ICD-10-CM

## 2023-04-11 DIAGNOSIS — Z79899 Other long term (current) drug therapy: Secondary | ICD-10-CM

## 2023-04-11 DIAGNOSIS — K5909 Other constipation: Secondary | ICD-10-CM | POA: Diagnosis not present

## 2023-04-11 DIAGNOSIS — Z90710 Acquired absence of both cervix and uterus: Secondary | ICD-10-CM | POA: Diagnosis not present

## 2023-04-11 DIAGNOSIS — K5900 Constipation, unspecified: Secondary | ICD-10-CM | POA: Diagnosis present

## 2023-04-11 DIAGNOSIS — Z043 Encounter for examination and observation following other accident: Secondary | ICD-10-CM | POA: Diagnosis not present

## 2023-04-11 DIAGNOSIS — Z7401 Bed confinement status: Secondary | ICD-10-CM | POA: Diagnosis not present

## 2023-04-11 DIAGNOSIS — G20A1 Parkinson's disease without dyskinesia, without mention of fluctuations: Secondary | ICD-10-CM | POA: Diagnosis not present

## 2023-04-11 DIAGNOSIS — R0689 Other abnormalities of breathing: Secondary | ICD-10-CM | POA: Diagnosis not present

## 2023-04-11 DIAGNOSIS — R0902 Hypoxemia: Secondary | ICD-10-CM | POA: Diagnosis not present

## 2023-04-11 DIAGNOSIS — Z87891 Personal history of nicotine dependence: Secondary | ICD-10-CM | POA: Diagnosis not present

## 2023-04-11 DIAGNOSIS — S199XXA Unspecified injury of neck, initial encounter: Secondary | ICD-10-CM | POA: Diagnosis not present

## 2023-04-11 DIAGNOSIS — F418 Other specified anxiety disorders: Secondary | ICD-10-CM | POA: Diagnosis not present

## 2023-04-11 DIAGNOSIS — S72002A Fracture of unspecified part of neck of left femur, initial encounter for closed fracture: Secondary | ICD-10-CM | POA: Diagnosis not present

## 2023-04-11 DIAGNOSIS — K219 Gastro-esophageal reflux disease without esophagitis: Secondary | ICD-10-CM | POA: Diagnosis not present

## 2023-04-11 DIAGNOSIS — S72142A Displaced intertrochanteric fracture of left femur, initial encounter for closed fracture: Principal | ICD-10-CM | POA: Diagnosis present

## 2023-04-11 DIAGNOSIS — M48061 Spinal stenosis, lumbar region without neurogenic claudication: Secondary | ICD-10-CM | POA: Diagnosis not present

## 2023-04-11 DIAGNOSIS — S72002S Fracture of unspecified part of neck of left femur, sequela: Secondary | ICD-10-CM

## 2023-04-11 DIAGNOSIS — S0990XA Unspecified injury of head, initial encounter: Secondary | ICD-10-CM | POA: Diagnosis not present

## 2023-04-11 DIAGNOSIS — W19XXXS Unspecified fall, sequela: Secondary | ICD-10-CM | POA: Diagnosis not present

## 2023-04-11 DIAGNOSIS — F03A18 Unspecified dementia, mild, with other behavioral disturbance: Secondary | ICD-10-CM | POA: Diagnosis not present

## 2023-04-11 DIAGNOSIS — Z800 Family history of malignant neoplasm of digestive organs: Secondary | ICD-10-CM

## 2023-04-11 DIAGNOSIS — S72142D Displaced intertrochanteric fracture of left femur, subsequent encounter for closed fracture with routine healing: Secondary | ICD-10-CM | POA: Diagnosis not present

## 2023-04-11 DIAGNOSIS — S72101A Unspecified trochanteric fracture of right femur, initial encounter for closed fracture: Secondary | ICD-10-CM | POA: Diagnosis not present

## 2023-04-11 LAB — CBC WITH DIFFERENTIAL/PLATELET
Abs Immature Granulocytes: 0.09 10*3/uL — ABNORMAL HIGH (ref 0.00–0.07)
Basophils Absolute: 0 10*3/uL (ref 0.0–0.1)
Basophils Relative: 1 %
Eosinophils Absolute: 0.1 10*3/uL (ref 0.0–0.5)
Eosinophils Relative: 1 %
HCT: 37.9 % (ref 36.0–46.0)
Hemoglobin: 12.6 g/dL (ref 12.0–15.0)
Immature Granulocytes: 1 %
Lymphocytes Relative: 12 %
Lymphs Abs: 0.9 10*3/uL (ref 0.7–4.0)
MCH: 30.4 pg (ref 26.0–34.0)
MCHC: 33.2 g/dL (ref 30.0–36.0)
MCV: 91.5 fL (ref 80.0–100.0)
Monocytes Absolute: 0.4 10*3/uL (ref 0.1–1.0)
Monocytes Relative: 5 %
Neutro Abs: 5.9 10*3/uL (ref 1.7–7.7)
Neutrophils Relative %: 80 %
Platelets: 216 10*3/uL (ref 150–400)
RBC: 4.14 MIL/uL (ref 3.87–5.11)
RDW: 13.9 % (ref 11.5–15.5)
WBC: 7.4 10*3/uL (ref 4.0–10.5)
nRBC: 0 % (ref 0.0–0.2)

## 2023-04-11 LAB — BASIC METABOLIC PANEL
Anion gap: 7 (ref 5–15)
BUN: 14 mg/dL (ref 8–23)
CO2: 24 mmol/L (ref 22–32)
Calcium: 8.3 mg/dL — ABNORMAL LOW (ref 8.9–10.3)
Chloride: 107 mmol/L (ref 98–111)
Creatinine, Ser: 1.05 mg/dL — ABNORMAL HIGH (ref 0.44–1.00)
GFR, Estimated: 51 mL/min — ABNORMAL LOW (ref >60)
Glucose, Bld: 106 mg/dL — ABNORMAL HIGH (ref 70–99)
Potassium: 3.8 mmol/L (ref 3.5–5.1)
Sodium: 138 mmol/L (ref 135–145)

## 2023-04-11 LAB — TYPE AND SCREEN: Antibody Screen: NEGATIVE

## 2023-04-11 MED ORDER — HYDROMORPHONE HCL 1 MG/ML IJ SOLN
0.5000 mg | INTRAMUSCULAR | Status: DC | PRN
  Administered 2023-04-11 – 2023-04-12 (×3): 0.5 mg via INTRAVENOUS
  Filled 2023-04-11 (×3): qty 1

## 2023-04-11 MED ORDER — SODIUM CHLORIDE 0.9 % IV SOLN: INTRAVENOUS | Status: DC

## 2023-04-11 MED ORDER — ONDANSETRON HCL 4 MG/2ML IJ SOLN: 4.0000 mg | Freq: Four times a day (QID) | INTRAMUSCULAR | Status: DC | PRN

## 2023-04-11 MED ORDER — ENOXAPARIN SODIUM 40 MG/0.4ML IJ SOSY: 40.0000 mg | PREFILLED_SYRINGE | INTRAMUSCULAR | Status: DC

## 2023-04-11 MED ORDER — TRANEXAMIC ACID-NACL 1000-0.7 MG/100ML-% IV SOLN
1000.0000 mg | INTRAVENOUS | Status: AC
  Administered 2023-04-12: 1000 mg via INTRAVENOUS
  Filled 2023-04-11: qty 100

## 2023-04-11 MED ORDER — ORAL CARE MOUTH RINSE: 15.0000 mL | OROMUCOSAL | Status: DC | PRN

## 2023-04-11 MED ORDER — OXYCODONE HCL 5 MG PO TABS
10.0000 mg | ORAL_TABLET | ORAL | Status: DC | PRN
  Administered 2023-04-11 – 2023-04-12 (×2): 10 mg via ORAL
  Filled 2023-04-11 (×2): qty 2

## 2023-04-11 MED ORDER — MORPHINE SULFATE (PF) 4 MG/ML IV SOLN
4.0000 mg | Freq: Once | INTRAVENOUS | Status: AC
  Administered 2023-04-11: 4 mg via INTRAVENOUS
  Filled 2023-04-11: qty 1

## 2023-04-11 MED ORDER — HYDROMORPHONE HCL 1 MG/ML IJ SOLN
0.5000 mg | Freq: Once | INTRAMUSCULAR | Status: AC
  Administered 2023-04-11: 0.5 mg via INTRAVENOUS
  Filled 2023-04-11: qty 0.5

## 2023-04-11 MED ORDER — ONDANSETRON HCL 4 MG PO TABS: 4.0000 mg | ORAL_TABLET | Freq: Four times a day (QID) | ORAL | Status: DC | PRN

## 2023-04-11 NOTE — Assessment & Plan Note (Signed)
PPI ?

## 2023-04-11 NOTE — ED Provider Notes (Signed)
[facility] Provider Note    Event Date/Time   First MD Initiated Contact with Patient 04/11/23 1012     (approximate)   History   Chief Complaint Fall and Hip Pain   HPI  Julie [name] is a 89 y.o. female with past medical history of hyperlipidemia, GERD, and osteoarthritis who presents to the ED complaining of fall.  Patient reports that just prior to arrival she was on her way to breakfast when she got tripped up in her walker and fell onto her left side.  She states that she hit her head but did not lose consciousness, denies taking any blood thinners.  She now primarily complains of pain in her left hip, has been unable to move the leg since the fall.  EMS reports obvious deformity to the hip on arrival, she was given 100 mcg of fentanyl along with 4 mg of Zofran via IV prior to arrival.  Patient denies significant headache, neck pain, or upper extremity pain following the fall.     Physical Exam   Triage Vital Signs: ED Triage Vitals [04/11/23 1015]  Enc Vitals Group     BP      Pulse      Resp      Temp      Temp src      SpO2      Weight 176 lb 5.9 oz (80 kg)     Height 5\' 2"  (1.575 m)     Head Circumference      Peak Flow      Pain Score 10     Pain Loc      Pain Edu?      Excl. in GC?     Most recent vital signs: Vitals:   04/11/23 1130 04/11/23 1200  BP: (!) 183/67 (!) 171/76  Pulse: 69 65  Resp: 12 16  Temp:    SpO2: 97% 100%    Constitutional: Alert and oriented. Eyes: Conjunctivae are normal. Head: Atraumatic. Nose: No congestion/rhinnorhea. Mouth/Throat: Mucous membranes are moist.  Neck: No midline cervical spine tenderness to palpation. Cardiovascular: Normal rate, regular rhythm. Grossly normal heart sounds.  2+ radial and DP pulses bilaterally. Respiratory: Normal respiratory effort.  No retractions. Lungs CTAB. Gastrointestinal: Soft and nontender. No distention. Musculoskeletal: Diffuse tenderness to  palpation of left hip with shortening and external rotation noted.  Diffuse tenderness to palpation at left knee with no obvious deformity.  No tenderness to palpation at right hip, knee, or bilateral ankles.  No upper extremity bony tenderness to palpation. Neurologic:  Normal speech and language. No gross focal neurologic deficits are appreciated.    ED Results / Procedures / Treatments   Labs (all labs ordered are listed, but only abnormal results are displayed) Labs Reviewed  CBC WITH DIFFERENTIAL/PLATELET - Abnormal; Notable for the following components:      Result Value   Abs Immature Granulocytes 0.09 (*)    All other components within normal limits  BASIC METABOLIC PANEL - Abnormal; Notable for the following components:   Glucose, Bld 106 (*)    Creatinine, Ser 1.05 (*)    Calcium 8.3 (*)    GFR, Estimated 51 (*)    All other components within normal limits  TYPE AND SCREEN     EKG  ED ECG REPORT I, Chesley Noon, the attending physician, personally viewed and interpreted this ECG.   Date: 04/11/2023  EKG Time: 10:20  Rate: 67  Rhythm: normal sinus rhythm  Axis: Normal  Intervals:none  ST&T Change: None  RADIOLOGY Hip x-ray reviewed and interpreted by me with intertrochanteric fracture, no dislocation noted.  PROCEDURES:  Critical Care performed: No  Procedures   MEDICATIONS ORDERED IN ED: Medications  tranexamic acid (CYKLOKAPRON) IVPB 1,000 mg (has no administration in time range)  morphine (PF) 4 MG/ML injection 4 mg (4 mg Intravenous Given 04/11/23 1026)  HYDROmorphone (DILAUDID) injection 0.5 mg (0.5 mg Intravenous Given 04/11/23 1211)     IMPRESSION / MDM / ASSESSMENT AND PLAN / ED COURSE  I reviewed the triage vital signs and the nursing notes.                              89 y.o. female with past medical history of hyperlipidemia, GERD, and osteoarthritis who presents to the ED complaining of left hip and knee pain following fall where she  got tripped up in her walker.  Patient's presentation is most consistent with acute presentation with potential threat to life or bodily function.  Differential diagnosis includes, but is not limited to, hip fracture, dislocation, knee fracture, dislocation, intracranial injury, cervical spine injury.  Patient uncomfortable appearing but in no acute distress, vital signs are unremarkable.  She is neurovascularly intact distally to her left lower extremity, does appear to have a deformity to her left hip.  We will check x-rays of her left hip and knee, also check CT scan of her head and neck given she reports hitting her head.  No evidence of traumatic injury to her trunk or upper extremities.  We will treat symptomatically with IV morphine and reassess.  X-ray imaging of left hip shows intertrochanteric fracture with no dislocation, knee x-ray is unremarkable.  CT imaging of head and cervical spine are also negative for acute process.  Ongoing pain managed with IV Dilaudid, case discussed with Dr. Allena Katz of orthopedics will tentatively plan on operative repair tomorrow.  Labs show mild AKI with no significant anemia or leukocytosis.  Case discussed with hospitalist for admission.      FINAL CLINICAL IMPRESSION(S) / ED DIAGNOSES   Final diagnoses:  Closed fracture of left hip, initial encounter [facility])  Fall, initial encounter     Rx / DC Orders   ED Discharge Orders     None        Note:  This document was prepared using Dragon voice recognition software and may include unintentional dictation errors.   Chesley Noon, MD 04/11/23 1301

## 2023-04-11 NOTE — Anesthesia Preprocedure Evaluation (Signed)
Anesthesia Evaluation  Patient identified by MRN, date of birth, ID band Patient awake    Reviewed: Allergy & Precautions, H&P , NPO status , Patient's Chart, lab work & pertinent test results  History of Anesthesia Complications (+) PONV and history of anesthetic complications  Airway Mallampati: II  TM Distance: >3 FB Neck ROM: full    Dental  (+) Chipped   Pulmonary shortness of breath and with exertion, former smoker   Pulmonary exam normal        Cardiovascular negative cardio ROS Normal cardiovascular exam     Neuro/Psych  PSYCHIATRIC DISORDERS       Neuromuscular disease (Spinal stenosis of lumbar region)    GI/Hepatic Neg liver ROS,GERD  ,,  Endo/Other  negative endocrine ROS    Renal/GU Renal InsufficiencyRenal disease     Musculoskeletal  (+) Arthritis ,    Abdominal   Peds  Hematology  (+) Blood dyscrasia, anemia   Anesthesia Other Findings Left Hip Fracture  Past Medical History: No date: Adenoma No date: Chronic leg pain No date: Colon polyp No date: DDD (degenerative disc disease), cervical No date: Diverticulosis of colon     Comment:  internal----Dr. Mechele Collin No date: Duodenitis No date: Esophagitis No date: GERD (gastroesophageal reflux disease) No date: Hemorrhoids No date: HLD (hyperlipidemia) No date: IBS (irritable bowel syndrome)     Comment:  with PP diarrhea No date: Neuropathy No date: Neuropathy No date: OA (osteoarthritis) of knee No date: Osteopenia No date: PONV (postoperative nausea and vomiting) No date: Scoliosis No date: Seborrheic dermatitis No date: Shortness of breath dyspnea     Comment:  with activity No date: TMJ syndrome No date: Trochanteric bursitis No date: Unsteady gait     Comment:  FALLS EASILY  Past Surgical History: 01/18/2013: CARPAL TUNNEL RELEASE; Left     Comment:  Procedure: CARPAL TUNNEL RELEASE;  Surgeon: Nicki Reaper, MD;   Location: [facility];                Service: Orthopedics;  Laterality: Left;  OSTEOTOMY LEFT               DISTAL RADIUS BONE CHIPS CARPAL TUNNEL RELEASE LEFT  08/07/2015: CATARACT EXTRACTION W/PHACO; Right     Comment:  Procedure: CATARACT EXTRACTION PHACO AND INTRAOCULAR               LENS PLACEMENT (IOC);  Surgeon: Galen Manila, MD;                Location: [facility];  Service: Ophthalmology;  Laterality:              Right;  [place] 00:48.0 AP   22.3 CDE 10.69 casette lot #               [id number] 09/04/2015: CATARACT EXTRACTION W/PHACO; Left     Comment:  Procedure: CATARACT EXTRACTION PHACO AND INTRAOCULAR               LENS PLACEMENT (I[facility]);  Surgeon: Galen Manila, MD;                Location: [facility];  Service: Ophthalmology;  Laterality:              Left;  [place] AP CDE FLUID LOT # [id number] 2001: CEREBRAL ANEURYSM REPAIR     Comment:  clamps 1/11: COLONOSCOPY     Comment:  polyps-hyperplastic and adenomatous 2010: FOOT SURGERY     Comment:  bilateral hammer toes and "knot" 01/2014: FRACTURE SURGERY; Right     Comment:  result of a fall 03/2014: FRACTURE SURGERY; Left     Comment:  result of a fall No date: HEMORRHOID SURGERY 10/15/2015: KNEE ARTHROPLASTY; Left     Comment:  Procedure: COMPUTER ASSISTED TOTAL KNEE ARTHROPLASTY;                Surgeon: Donato Heinz, MD;  Location: [facility];                Service: Orthopedics;  Laterality: Left; 02/13/2015: OPEN REDUCTION INTERNAL FIXATION (ORIF) DISTAL RADIAL  FRACTURE; Right     Comment:  Procedure: OPEN REDUCTION INTERNAL FIXATION (ORIF) RIGHT              DISTAL RADIUS;  Surgeon: Cindee Salt, MD;  Location: [facility];  Service: Orthopedics;  Laterality:               Right; 11/01/2013: OPEN REDUCTION INTERNAL FIXATION (ORIF) FINGER WITH RADIAL  BONE GRAFT; Left     Comment:  Procedure: OPEN REDUCTION INTERNAL FIXATION (ORIF) LEFT               SMALL FINGER;  Surgeon: Nicki Reaper, MD;  Location:               [facility];  Service: Orthopedics;                Laterality: Left; No date: REPLACEMENT TOTAL KNEE; Left No date: TOTAL ABDOMINAL HYSTERECTOMY 01/18/2013: WRIST OSTEOTOMY; Left     Comment:  Procedure: WRIST OSTEOTOMY;  Surgeon: Nicki Reaper, MD;               [facility];  Service:               Orthopedics;  Laterality: Left;  BMI    Body Mass Index: 32.26 kg/m      Reproductive/Obstetrics negative OB ROS                              Anesthesia Physical Anesthesia Plan  ASA: 3  Anesthesia Plan: Spinal   Post-op Pain Management:    Induction: Intravenous  PONV Risk Score and Plan: Propofol infusion  Airway Management Planned:   Additional Equipment:   Intra-op Plan:   Post-operative Plan:   Informed Consent:      Dental Advisory Given  Plan Discussed with: CRNA and Surgeon  Anesthesia Plan Comments:          Anesthesia Quick Evaluation

## 2023-04-11 NOTE — Consult Note (Signed)
ORTHOPAEDIC CONSULTATION  REQUESTING PHYSICIAN: Chesley Noon, MD  Chief Complaint:   L hip pain  History of Present Illness: Julie [name] is a 89 y.o. female who had a fall earlier today.  She was getting her breakfast and tripped over her walker and fell backwards.  The patient noted immediate hip pain and inability to ambulate.  The patient ambulates with a walker at baseline.  The patient lives at [facility] in an apartment by herself.  She does have a caregiver, Arline Asp, that is with her Monday through Friday. Pain is worse with any sort of movement.  X-rays in the emergency department show a left intertrochanteric hip fracture.  Past Medical History:  Diagnosis Date   Adenoma    Chronic leg pain    Colon polyp    DDD (degenerative disc disease), cervical    Diverticulosis of colon    internal----Dr. Mechele Collin   Duodenitis    Esophagitis    GERD (gastroesophageal reflux disease)    Hemorrhoids    HLD (hyperlipidemia)    IBS (irritable bowel syndrome)    with PP diarrhea   Neuropathy    Neuropathy    OA (osteoarthritis) of knee    Osteopenia    PONV (postoperative nausea and vomiting)    Scoliosis    Seborrheic dermatitis    Shortness of breath dyspnea    with activity   TMJ syndrome    Trochanteric bursitis    Unsteady gait    FALLS EASILY   Past Surgical History:  Procedure Laterality Date   CARPAL TUNNEL RELEASE Left 01/18/2013   Procedure: CARPAL TUNNEL RELEASE;  Surgeon: Nicki Reaper, MD;  Location: [facility];  Service: Orthopedics;  Laterality: Left;  OSTEOTOMY LEFT DISTAL RADIUS BONE CHIPS CARPAL TUNNEL RELEASE LEFT    CATARACT EXTRACTION W/PHACO Right 08/07/2015   Procedure: CATARACT EXTRACTION PHACO AND INTRAOCULAR LENS PLACEMENT (IOC);  Surgeon: Galen Manila, MD;  Location: [facility];  Service: Ophthalmology;  Laterality: Right;  [place] 00:48.0 AP   22.3 CDE  10.69 casette lot # [id number]   CATARACT EXTRACTION W/PHACO Left 09/04/2015   Procedure: CATARACT EXTRACTION PHACO AND INTRAOCULAR LENS PLACEMENT (IOC);  Surgeon: Galen Manila, MD;  Location: [facility];  Service: Ophthalmology;  Laterality: Left;  [place] AP CDE FLUID LOT # [id number]   CEREBRAL ANEURYSM REPAIR  2001   clamps   COLONOSCOPY  1/11   polyps-hyperplastic and adenomatous   FOOT SURGERY  2010   bilateral hammer toes and "knot"   FRACTURE SURGERY Right 01/2014   result of a fall   FRACTURE SURGERY Left 03/2014   result of a fall   HEMORRHOID SURGERY     KNEE ARTHROPLASTY Left 10/15/2015   Procedure: COMPUTER ASSISTED TOTAL KNEE ARTHROPLASTY;  Surgeon: Donato Heinz, MD;  Location: [facility];  Service: Orthopedics;  Laterality: Left;   OPEN REDUCTION INTERNAL FIXATION (ORIF) DISTAL RADIAL FRACTURE Right 02/13/2015   Procedure: OPEN REDUCTION INTERNAL FIXATION (ORIF) RIGHT DISTAL RADIUS;  Surgeon: Cindee Salt, MD;  Location: [facility];  Service: Orthopedics;  Laterality: Right;   OPEN REDUCTION INTERNAL FIXATION (ORIF) FINGER WITH RADIAL BONE GRAFT Left 11/01/2013   Procedure: OPEN REDUCTION INTERNAL FIXATION (ORIF) LEFT SMALL FINGER;  Surgeon: Nicki Reaper, MD;  Location: [facility];  Service: Orthopedics;  Laterality: Left;   REPLACEMENT TOTAL KNEE Left    TOTAL ABDOMINAL HYSTERECTOMY     WRIST OSTEOTOMY Left 01/18/2013   Procedure: WRIST OSTEOTOMY;  Surgeon: Nicki Reaper, MD;  Location: [facility];  Service: Orthopedics;  Laterality: Left;   Social History   Socioeconomic History   Marital status: Widowed    Spouse name: Renae Fickle   Number of children: Not on file   Years of education: Not on file   Highest education level: Not on file  Occupational History   Occupation: Retired    Comment: bookkeeper  Tobacco Use   Smoking status: Former    Types: Cigarettes    Quit date: 10/02/1954    Years since quitting: 68.5   Smokeless tobacco:  Never  Vaping Use   Vaping Use: Never used  Substance and Sexual Activity   Alcohol use: No    Alcohol/week: 0.0 standard drinks of alcohol   Drug use: No   Sexual activity: Never    Comment: smoked alittle as teen  Other Topics Concern   Not on file  Social History Narrative   Retired Married Regular exercise   Husband had dementia. - recently passed away   Has a caregiver 10:30-2:30 daily   Social Determinants of Health   Financial Resource Strain: Low Risk  (05/29/2022)   Overall Financial Resource Strain (CARDIA)    Difficulty of Paying Living Expenses: Not hard at all  Food Insecurity: No Food Insecurity (05/29/2022)   Hunger Vital Sign    Worried About Running Out of Food in the Last Year: Never true    Ran Out of Food in the Last Year: Never true  Transportation Needs: No Transportation Needs (05/29/2022)   PRAPARE - Administrator, Civil Service (Medical): No    Lack of Transportation (Non-Medical): No  Physical Activity: Insufficiently Active (05/29/2022)   Exercise Vital Sign    Days of Exercise per Week: 7 days    Minutes of Exercise per Session: 10 min  Stress: No Stress Concern Present (05/29/2022)   [employer] of Occupational Health - Occupational Stress Questionnaire    Feeling of Stress : Only a little  Social Connections: Moderately Integrated (05/29/2022)   Social Connection and Isolation Panel [NHANES]    Frequency of Communication with Friends and Family: More than three times a week    Frequency of Social Gatherings with Friends and Family: More than three times a week    Attends Religious Services: More than 4 times per year    Active Member of [employer] or Organizations: Yes    Attends Banker Meetings: More than 4 times per year    Marital Status: Widowed   Family History  Problem Relation Age of Onset   Colon cancer Mother    Osteoporosis Other        hip fracture   Stroke Brother    Colon cancer Brother    Hypertension  Brother    Other Brother        Heart problem   Colon cancer Brother    Colon cancer Maternal Aunt    Breast cancer Neg Hx    Allergies  Allergen Reactions   Codeine     REACTION: headache and nausea and vomiting   Prior to Admission medications   Medication Sig Start Date End Date Taking? Authorizing Provider  acetaminophen (TYLENOL) 500 MG tablet Take 500 mg by mouth every 6 (six) hours as needed.    [provider]  alendronate (FOSAMAX) 70 MG tablet Take 1 tablet (70 mg total) by mouth every 7 (seven) days. Take with a full glass of water on an  empty stomach. 01/19/23   Tower, Audrie Gallus, MD  Alum Hydroxide-Mag Carbonate 160-105 MG CHEW Chew 1 tablet by mouth as needed (acid reflux).    [provider]  cephALEXin (KEFLEX) 500 MG capsule Take 1 capsule (500 mg total) by mouth 2 (two) times daily. 02/17/23   Tower, Audrie Gallus, MD  Cholecalciferol (SM VITAMIN D3) 100 MCG (4000 UT) CAPS Take 4,000 Units by mouth daily.    [provider]  FERREX 150 150 MG capsule TAKE 1 CAPSULE BY MOUTH ONCE DAILY 09/09/22   Tower, Audrie Gallus, MD  gabapentin (NEURONTIN) 300 MG capsule Take 1 capsule (300 mg total) by mouth at bedtime. 01/11/23   Tower, Audrie Gallus, MD  nystatin (MYCOSTATIN/NYSTOP) powder Apply 1 Application topically 2 (two) times daily as needed. To affected area under breasts and abdomen 01/19/23   Tower, Audrie Gallus, MD  vitamin B-12 (CYANOCOBALAMIN) 1000 MCG tablet Take 1,000 mcg by mouth daily.    [provider]   Recent Labs    04/11/23 1120  WBC 7.4  HGB 12.6  HCT 37.9  PLT 216  K 3.8  CL 107  CO2 24  BUN 14  CREATININE 1.05*  GLUCOSE 106*  CALCIUM 8.3*   CT Head Wo Contrast  Result Date: 04/11/2023 CLINICAL DATA:  Neck trauma EXAM: CT HEAD WITHOUT CONTRAST CT CERVICAL SPINE WITHOUT CONTRAST TECHNIQUE: Multidetector CT imaging of the head and cervical spine was performed following the standard protocol without intravenous contrast. Multiplanar CT  image reconstructions of the cervical spine were also generated. RADIATION DOSE REDUCTION: This exam was performed according to the departmental dose-optimization program which includes automated exposure control, adjustment of the mA and/or kV according to patient size and/or use of iterative reconstruction technique. COMPARISON:  10/09/2020 FINDINGS: CT HEAD FINDINGS Brain: No evidence of acute infarction, hemorrhage, hydrocephalus, extra-axial collection or mass lesion/mass effect. Extensive chronic small vessel ischemia in the cerebral white matter. Small cerebellar infarcts. Vascular: Aneurysm clipping along the right side of the circle[place]of[place]Willis. Intracranial arterial tortuosity. Skull: Unremarkable right-sided craniotomy. Sinuses/Orbits: Bilateral cataract resection.  No evidence of injury CT CERVICAL SPINE FINDINGS Alignment: Normal. Skull base and vertebrae: No acute fracture. No primary bone lesion or focal pathologic process. Soft tissues and spinal canal: No prevertebral fluid or swelling. No visible canal hematoma. Disc levels:  Generalized degenerative endplate and facet spurring. Upper chest: Negative. IMPRESSION: No evidence of acute intracranial or cervical spine injury. Electronically Signed   By: Tiburcio Pea M.D.   On: 04/11/2023 11:09   CT Cervical Spine Wo Contrast  Result Date: 04/11/2023 CLINICAL DATA:  Neck trauma EXAM: CT HEAD WITHOUT CONTRAST CT CERVICAL SPINE WITHOUT CONTRAST TECHNIQUE: Multidetector CT imaging of the head and cervical spine was performed following the standard protocol without intravenous contrast. Multiplanar CT image reconstructions of the cervical spine were also generated. RADIATION DOSE REDUCTION: This exam was performed according to the departmental dose-optimization program which includes automated exposure control, adjustment of the mA and/or kV according to patient size and/or use of iterative reconstruction technique. COMPARISON:  10/09/2020 FINDINGS: CT  HEAD FINDINGS Brain: No evidence of acute infarction, hemorrhage, hydrocephalus, extra-axial collection or mass lesion/mass effect. Extensive chronic small vessel ischemia in the cerebral white matter. Small cerebellar infarcts. Vascular: Aneurysm clipping along the right side of the circle-of-Willis. Intracranial arterial tortuosity. Skull: Unremarkable right-sided craniotomy. Sinuses/Orbits: Bilateral cataract resection.  No evidence of injury CT CERVICAL SPINE FINDINGS Alignment: Normal. Skull base and vertebrae: No acute fracture. No primary bone lesion or  focal pathologic process. Soft tissues and spinal canal: No prevertebral fluid or swelling. No visible canal hematoma. Disc levels:  Generalized degenerative endplate and facet spurring. Upper chest: Negative. IMPRESSION: No evidence of acute intracranial or cervical spine injury. Electronically Signed   By: Tiburcio Pea M.D.   On: 04/11/2023 11:09   DG Chest 1 View  Result Date: 04/11/2023 CLINICAL DATA:  Fall, hip fracture EXAM: CHEST  1 VIEW COMPARISON:  None Available. FINDINGS: Lungs are clear. No pneumothorax or pleural effusion. Cardiac size is at the upper limits of normal. Pulmonary vascularity is normal. No acute bone abnormality. IMPRESSION: 1. No active disease. Electronically Signed   By: Helyn Numbers M.D.   On: 04/11/2023 11:07   DG Knee 2 Views Left  Result Date: 04/11/2023 CLINICAL DATA:  Unwitnessed fall. EXAM: LEFT KNEE - 1-2 VIEW COMPARISON:  None Available. FINDINGS: Two view study. Study limited by positioning. No evidence for an acute periprosthetic fracture on this limited study. The lateral cross-table film is rotated, but no gross joint effusion evident. IMPRESSION: Negative. Electronically Signed   By: Kennith Center M.D.   On: 04/11/2023 10:50   DG Hip Unilat W or Wo Pelvis 2-3 Views Left  Result Date: 04/11/2023 CLINICAL DATA:  Unwitnessed fall. EXAM: DG HIP (WITH OR WITHOUT PELVIS) 2-3V LEFT COMPARISON:  None  Available. FINDINGS: Bones are diffusely demineralized. Comminuted left intertrochanteric femoral neck fracture with associated varus angulation. No evidence for hip dislocation. SI joints unremarkable. Degenerative changes noted at the symphysis pubis. IMPRESSION: Comminuted left intertrochanteric femoral neck fracture with associated varus angulation. Electronically Signed   By: Kennith Center M.D.   On: 04/11/2023 10:49     Positive ROS: All other systems have been reviewed and were otherwise negative with the exception of those mentioned in the HPI and as above.  Physical Exam: BP (!) 121/109   Pulse 67   Temp 98.6 F (37 C) (Oral)   Resp 18   Ht 5\' 2"  (1.575 m)   Wt 80 kg   SpO2 97%   BMI 32.26 kg/m  General:  Alert, no acute distress Psychiatric:  Patient is competent for consent with normal mood and affect     Orthopedic Exam:  LLE: + DF/PF/EHL SILT grossly over foot Foot wwp +Log roll/axial load   X-rays:  As above: L intertrochanteric hip fracture.  Left TKA in position without any signs of hardware failure or periprosthetic fracture.  Assessment/Plan: Julie [name] is a 89 y.o. female with a L intertrochanteric hip fracture   1. I discussed the various treatment options including both surgical and non-surgical management of the fracture with the patient and/or family (medical PoA). We discussed the high risk of perioperative complications due to patient's age, dementia, and other co-morbidities. After discussion of risks, benefits, and alternatives to surgery, the family and/or patient were in agreement to proceed with surgery.The goals of surgery would be to provide adequate pain relief and allow for mobilization. Plan for surgery is L hip cephalomedullary nailing tomorrow, 04/11/23 2. NPO after midnight 3. Hold anticoagulation in advance of OR.  A dose of TXA is to be given in the ED. 4. Admit to Hospitalist service   Signa Kell   04/11/2023 12:00 PM

## 2023-04-11 NOTE — Assessment & Plan Note (Signed)
Appears at baseline dementia with parkinsonism Monitor

## 2023-04-11 NOTE — H&P (Addendum)
History and Physical    Patient: Julie [name]:[id number] DOB: 1932/04/24 DOA: 04/11/2023 DOS: the patient was seen and examined on 04/11/2023 PCP: Judy Pimple, MD  Patient coming from: Home  Chief Complaint:  Chief Complaint  Patient presents with   Fall   Hip Pain   HPI: Julie [name] is a 89 y.o. female with medical history significant of parkinsonism, GERD, weakness presenting with fall and left hip fracture.  Limited history in the setting of parkinsonism and likely baseline dementia.  History primarily from patient's neighbors.  Per report, patient fell on her way to breakfast on her walker.  Patient fell on her left side.  No reported head trauma loss conscious.  Per the neighbors, patient lives at home with a caregiver thick checks on patient throughout the week.  No reports of chest pain, shortness of breath, nausea or vomiting.  No reported dizziness or weakness prior to fall.  Patient with baseline unsteady gait. Presented to the ER afebrile, blood pressure 120s to 180s over 60s to 100s.  Satting well on room air.  White count 7.4, hemoglobin 12.6, platelets 216.  Creatinine 1.05.  CT head and CT C-spine grossly stable.  Chest x-ray stable.  Hip plain films with comminuted left intertrochanteric femoral neck fracture with associated varus angulation.  Per Dr. Larinda Buttery, case discussed with on-call orthopedic surgeon Dr. Allena Katz who will likely evaluate patient tomorrow for operative repair. Review of Systems: As mentioned in the history of present illness. All other systems reviewed and are negative. Past Medical History:  Diagnosis Date   Adenoma    Chronic leg pain    Colon polyp    DDD (degenerative disc disease), cervical    Diverticulosis of colon    internal----Dr. Mechele Collin   Duodenitis    Esophagitis    GERD (gastroesophageal reflux disease)    Hemorrhoids    HLD (hyperlipidemia)    IBS (irritable bowel syndrome)    with PP diarrhea   Neuropathy    Neuropathy     OA (osteoarthritis) of knee    Osteopenia    PONV (postoperative nausea and vomiting)    Scoliosis    Seborrheic dermatitis    Shortness of breath dyspnea    with activity   TMJ syndrome    Trochanteric bursitis    Unsteady gait    FALLS EASILY   Past Surgical History:  Procedure Laterality Date   CARPAL TUNNEL RELEASE Left 01/18/2013   Procedure: CARPAL TUNNEL RELEASE;  Surgeon: Nicki Reaper, MD;  Location: [facility];  Service: Orthopedics;  Laterality: Left;  OSTEOTOMY LEFT DISTAL RADIUS BONE CHIPS CARPAL TUNNEL RELEASE LEFT    CATARACT EXTRACTION W/PHACO Right 08/07/2015   Procedure: CATARACT EXTRACTION PHACO AND INTRAOCULAR LENS PLACEMENT (IOC);  Surgeon: Galen Manila, MD;  Location: [facility];  Service: Ophthalmology;  Laterality: Right;  [place] 00:48.0 AP   22.3 CDE 10.69 casette lot # [id number]   CATARACT EXTRACTION W/PHACO Left 09/04/2015   Procedure: CATARACT EXTRACTION PHACO AND INTRAOCULAR LENS PLACEMENT (IOC);  Surgeon: Galen Manila, MD;  Location: [facility];  Service: Ophthalmology;  Laterality: Left;  [place] AP CDE FLUID LOT # [id number]   CEREBRAL ANEURYSM REPAIR  2001   clamps   COLONOSCOPY  1/11   polyps-hyperplastic and adenomatous   FOOT SURGERY  2010   bilateral hammer toes and "knot"   FRACTURE SURGERY Right 01/2014   result of a fall   FRACTURE SURGERY Left 03/2014   result of  a fall   HEMORRHOID SURGERY     KNEE ARTHROPLASTY Left 10/15/2015   Procedure: COMPUTER ASSISTED TOTAL KNEE ARTHROPLASTY;  Surgeon: Donato Heinz, MD;  Location: [facility];  Service: Orthopedics;  Laterality: Left;   OPEN REDUCTION INTERNAL FIXATION (ORIF) DISTAL RADIAL FRACTURE Right 02/13/2015   Procedure: OPEN REDUCTION INTERNAL FIXATION (ORIF) RIGHT DISTAL RADIUS;  Surgeon: Cindee Salt, MD;  Location: [facility];  Service: Orthopedics;  Laterality: Right;   OPEN REDUCTION INTERNAL FIXATION (ORIF) FINGER WITH RADIAL BONE GRAFT Left 11/01/2013   Procedure:  OPEN REDUCTION INTERNAL FIXATION (ORIF) LEFT SMALL FINGER;  Surgeon: Nicki Reaper, MD;  Location: [facility];  Service: Orthopedics;  Laterality: Left;   REPLACEMENT TOTAL KNEE Left    TOTAL ABDOMINAL HYSTERECTOMY     WRIST OSTEOTOMY Left 01/18/2013   Procedure: WRIST OSTEOTOMY;  Surgeon: Nicki Reaper, MD;  Location: [facility];  Service: Orthopedics;  Laterality: Left;   Social History:  reports that she quit smoking about 68 years ago. Her smoking use included cigarettes. She has never used smokeless tobacco. She reports that she does not drink alcohol and does not use drugs.  Allergies  Allergen Reactions   Codeine     REACTION: headache and nausea and vomiting    Family History  Problem Relation Age of Onset   Colon cancer Mother    Osteoporosis Other        hip fracture   Stroke Brother    Colon cancer Brother    Hypertension Brother    Other Brother        Heart problem   Colon cancer Brother    Colon cancer Maternal Aunt    Breast cancer Neg Hx     Prior to Admission medications   Medication Sig Start Date End Date Taking? Authorizing Provider  acetaminophen (TYLENOL) 500 MG tablet Take 500 mg by mouth every 6 (six) hours as needed.   Yes [provider]  Alum Hydroxide-Mag Carbonate 160-105 MG CHEW Chew 1 tablet by mouth as needed (acid reflux).   Yes [provider]  nystatin (MYCOSTATIN/NYSTOP) powder Apply 1 Application topically 2 (two) times daily as needed. To affected area under breasts and abdomen 01/19/23  Yes Tower, Audrie Gallus, MD  alendronate (FOSAMAX) 70 MG tablet Take 1 tablet (70 mg total) by mouth every 7 (seven) days. Take with a full glass of water on an empty stomach. 01/19/23   Tower, Audrie Gallus, MD  cephALEXin (KEFLEX) 500 MG capsule Take 1 capsule (500 mg total) by mouth 2 (two) times daily. Patient not taking: Reported on 04/11/2023 02/17/23   Tower, Audrie Gallus, MD  Cholecalciferol (SM VITAMIN D3) 100 MCG (4000 UT)  CAPS Take 4,000 Units by mouth daily.    [provider]  FERREX 150 150 MG capsule TAKE 1 CAPSULE BY MOUTH ONCE DAILY 09/09/22   Tower, Audrie Gallus, MD  gabapentin (NEURONTIN) 300 MG capsule Take 1 capsule (300 mg total) by mouth at bedtime. 01/11/23   Tower, Audrie Gallus, MD  vitamin B-12 (CYANOCOBALAMIN) 1000 MCG tablet Take 1,000 mcg by mouth daily.    [provider]    Physical Exam: Vitals:   04/11/23 1017 04/11/23 1130 04/11/23 1200 04/11/23 1230  BP: (!) 121/109 (!) 183/67 (!) 171/76 (!) 161/133  Pulse: 67 69 65 84  Resp: 18 12 16 10   Temp: 98.6 F (37 C)     TempSrc: Oral     SpO2: 97% 97%  100% 97%  Weight:      Height:       Physical Exam Constitutional:      Appearance: She is normal weight.  HENT:     Head: Normocephalic.     Nose: Nose normal.     Mouth/Throat:     Mouth: Mucous membranes are moist.  Eyes:     Pupils: Pupils are equal, round, and reactive to light.  Cardiovascular:     Rate and Rhythm: Normal rate and regular rhythm.  Pulmonary:     Effort: Pulmonary effort is normal.  Abdominal:     General: Abdomen is flat.  Musculoskeletal:     Comments: Positive left hip tenderness palpation   Skin:    General: Skin is warm.  Neurological:     General: No focal deficit present.     Comments: + generalized confusion    Psychiatric:        Mood and Affect: Mood normal.     Data Reviewed:  There are no new results to review at this time. CT Cervical Spine Wo Contrast CLINICAL DATA:  Neck trauma  EXAM: CT HEAD WITHOUT CONTRAST  CT CERVICAL SPINE WITHOUT CONTRAST  TECHNIQUE: Multidetector CT imaging of the head and cervical spine was performed following the standard protocol without intravenous contrast. Multiplanar CT image reconstructions of the cervical spine were also generated.  RADIATION DOSE REDUCTION: This exam was performed according to the departmental dose-optimization program which includes automated exposure control,  adjustment of the mA and/or kV according to patient size and/or use of iterative reconstruction technique.  COMPARISON:  10/09/2020  FINDINGS: CT HEAD FINDINGS  Brain: No evidence of acute infarction, hemorrhage, hydrocephalus, extra-axial collection or mass lesion/mass effect. Extensive chronic small vessel ischemia in the cerebral white matter. Small cerebellar infarcts.  Vascular: Aneurysm clipping along the right side of the circle[place]of-Willis. Intracranial arterial tortuosity.  Skull: Unremarkable right-sided craniotomy.  Sinuses/Orbits: Bilateral cataract resection.  No evidence of injury  CT CERVICAL SPINE FINDINGS  Alignment: Normal.  Skull base and vertebrae: No acute fracture. No primary bone lesion or focal pathologic process.  Soft tissues and spinal canal: No prevertebral fluid or swelling. No visible canal hematoma.  Disc levels:  Generalized degenerative endplate and facet spurring.  Upper chest: Negative.  IMPRESSION: No evidence of acute intracranial or cervical spine injury.  Electronically Signed   By: Tiburcio Pea M.D.   On: 04/11/2023 11:09 CT Head Wo Contrast CLINICAL DATA:  Neck trauma  EXAM: CT HEAD WITHOUT CONTRAST  CT CERVICAL SPINE WITHOUT CONTRAST  TECHNIQUE: Multidetector CT imaging of the head and cervical spine was performed following the standard protocol without intravenous contrast. Multiplanar CT image reconstructions of the cervical spine were also generated.  RADIATION DOSE REDUCTION: This exam was performed according to the departmental dose-optimization program which includes automated exposure control, adjustment of the mA and/or kV according to patient size and/or use of iterative reconstruction technique.  COMPARISON:  10/09/2020  FINDINGS: CT HEAD FINDINGS  Brain: No evidence of acute infarction, hemorrhage, hydrocephalus, extra-axial collection or mass lesion/mass effect. Extensive chronic small vessel  ischemia in the cerebral white matter. Small cerebellar infarcts.  Vascular: Aneurysm clipping along the right side of the circle-of-Willis. Intracranial arterial tortuosity.  Skull: Unremarkable right-sided craniotomy.  Sinuses/Orbits: Bilateral cataract resection.  No evidence of injury  CT CERVICAL SPINE FINDINGS  Alignment: Normal.  Skull base and vertebrae: No acute fracture. No primary bone lesion or focal pathologic process.  Soft tissues and spinal canal:  No prevertebral fluid or swelling. No visible canal hematoma.  Disc levels:  Generalized degenerative endplate and facet spurring.  Upper chest: Negative.  IMPRESSION: No evidence of acute intracranial or cervical spine injury.  Electronically Signed   By: Tiburcio Pea M.D.   On: 04/11/2023 11:09 DG Chest 1 View CLINICAL DATA:  Fall, hip fracture  EXAM: CHEST  1 VIEW  COMPARISON:  None Available.  FINDINGS: Lungs are clear. No pneumothorax or pleural effusion. Cardiac size is at the upper limits of normal. Pulmonary vascularity is normal. No acute bone abnormality.  IMPRESSION: 1. No active disease.  Electronically Signed   By: Helyn Numbers M.D.   On: 04/11/2023 11:07 DG Knee 2 Views Left CLINICAL DATA:  Unwitnessed fall.  EXAM: LEFT KNEE - 1-2 VIEW  COMPARISON:  None Available.  FINDINGS: Two view study. Study limited by positioning. No evidence for an acute periprosthetic fracture on this limited study. The lateral cross-table film is rotated, but no gross joint effusion evident.  IMPRESSION: Negative.  Electronically Signed   By: Kennith Center M.D.   On: 04/11/2023 10:50 DG Hip Unilat W or Wo Pelvis 2-3 Views Left CLINICAL DATA:  Unwitnessed fall.  EXAM: DG HIP (WITH OR WITHOUT PELVIS) 2-3V LEFT  COMPARISON:  None Available.  FINDINGS: Bones are diffusely demineralized. Comminuted left intertrochanteric femoral neck fracture with associated varus angulation. No  evidence for hip dislocation. SI joints unremarkable. Degenerative changes noted at the symphysis pubis.  IMPRESSION: Comminuted left intertrochanteric femoral neck fracture with associated varus angulation.  Electronically Signed   By: Kennith Center M.D.   On: 04/11/2023 10:49  Lab Results  Component Value Date   WBC 7.4 04/11/2023   HGB 12.6 04/11/2023   HCT 37.9 04/11/2023   MCV 91.5 04/11/2023   PLT 216 04/11/2023   Last metabolic panel Lab Results  Component Value Date   GLUCOSE 106 (H) 04/11/2023   NA 138 04/11/2023   K 3.8 04/11/2023   CL 107 04/11/2023   CO2 24 04/11/2023   BUN 14 04/11/2023   CREATININE 1.05 (H) 04/11/2023   GFRNONAA 51 (L) 04/11/2023   CALCIUM 8.3 (L) 04/11/2023   PHOS 3.2 08/13/2010   PROT 6.8 01/22/2022   ALBUMIN 3.9 01/22/2022   BILITOT 0.6 01/22/2022   ALKPHOS 89 01/22/2022   AST 11 01/22/2022   ALT 5 01/22/2022   ANIONGAP 7 04/11/2023    Assessment and Plan: * Hip fracture (HCC) Positive left hip fracture status post mechanical fall Dr. Alvester Morin with orthopedic surgery aware of case Will plan for operative repair tomorrow Pain control N.p.o. after midnight Follow  Fall Mechanical fall w/ no associated weakness or dizziness Noted L hip fracture  CT head WNL  PT/OT eval Fall precautions  Follow   Parkinsonism Appears at baseline dementia with parkinsonism Monitor   GERD PPI      Advance Care Planning:   Code Status: Full Code Will need clarification with family   Consults: Orthopedics w/ Dr. Allena Katz per Dr. Larinda Buttery   Family Communication: Neighbors at the bedside   Severity of Illness: The appropriate patient status for this patient is INPATIENT. Inpatient status is judged to be reasonable and necessary in order to provide the required intensity of service to ensure the patient's safety. The patient's presenting symptoms, physical exam findings, and initial radiographic and laboratory data in the context of their  chronic comorbidities is felt to place them at high risk for further clinical deterioration. Furthermore, it is not anticipated  that the patient will be medically stable for discharge from the hospital within 2 midnights of admission.   * I certify that at the point of admission it is my clinical judgment that the patient will require inpatient hospital care spanning beyond 2 midnights from the point of admission due to high intensity of service, high risk for further deterioration and high frequency of surveillance required.*  Author: Floydene Flock, MD 04/11/2023 1:54 PM  For on call review [email or web address].

## 2023-04-11 NOTE — ED Triage Notes (Signed)
Pt to ER via EMS form [facility], pt had unwitnessed mechanical fall after tripping over walker.  Pt with pain and deformity to left hip.  Pt given Fentanyl and 4mg  Zofran en route.

## 2023-04-11 NOTE — Assessment & Plan Note (Signed)
Mechanical fall w/ no associated weakness or dizziness Noted L hip fracture  CT head WNL  PT/OT eval Fall precautions  Follow

## 2023-04-11 NOTE — Assessment & Plan Note (Signed)
Positive left hip fracture status post mechanical fall Dr. Alvester Morin with orthopedic surgery aware of case Will plan for operative repair tomorrow Pain control N.p.o. after midnight Follow

## 2023-04-12 ENCOUNTER — Encounter
Admission: EM | Disposition: A | Discharge status: Skilled Nursing Facility | Payer: Self-pay | Source: Home / Self Care | Attending: Internal Medicine

## 2023-04-12 ENCOUNTER — Inpatient Hospital Stay: Payer: Medicare HMO | Admitting: Anesthesiology

## 2023-04-12 ENCOUNTER — Inpatient Hospital Stay: Payer: Medicare HMO

## 2023-04-12 DIAGNOSIS — M542 Cervicalgia: Secondary | ICD-10-CM | POA: Diagnosis not present

## 2023-04-12 DIAGNOSIS — G629 Polyneuropathy, unspecified: Secondary | ICD-10-CM

## 2023-04-12 DIAGNOSIS — S72002S Fracture of unspecified part of neck of left femur, sequela: Secondary | ICD-10-CM

## 2023-04-12 DIAGNOSIS — W19XXXS Unspecified fall, sequela: Secondary | ICD-10-CM

## 2023-04-12 DIAGNOSIS — G20C Parkinsonism, unspecified: Secondary | ICD-10-CM

## 2023-04-12 DIAGNOSIS — N1831 Chronic kidney disease, stage 3a: Secondary | ICD-10-CM | POA: Diagnosis not present

## 2023-04-12 DIAGNOSIS — S72002A Fracture of unspecified part of neck of left femur, initial encounter for closed fracture: Secondary | ICD-10-CM | POA: Diagnosis not present

## 2023-04-12 HISTORY — PX: INTRAMEDULLARY (IM) NAIL INTERTROCHANTERIC: SHX5875

## 2023-04-12 SURGERY — FIXATION, FRACTURE, INTERTROCHANTERIC, WITH INTRAMEDULLARY ROD
Anesthesia: Spinal | Site: Leg Upper | Laterality: Left

## 2023-04-12 MED ORDER — BUPIVACAINE HCL (PF) 0.5 % IJ SOLN
INTRAMUSCULAR | Status: DC | PRN
  Administered 2023-04-12: 2.2 mL via INTRATHECAL

## 2023-04-12 MED ORDER — OXYCODONE HCL 5 MG PO TABS
2.5000 mg | ORAL_TABLET | ORAL | Status: DC | PRN
  Filled 2023-04-12: qty 1

## 2023-04-12 MED ORDER — ACETAMINOPHEN 500 MG PO TABS: 1000.0000 mg | ORAL_TABLET | Freq: Once | ORAL | Status: DC

## 2023-04-12 MED ORDER — METHOCARBAMOL 1000 MG/10ML IJ SOLN
500.0000 mg | Freq: Four times a day (QID) | INTRAVENOUS | Status: DC | PRN
  Administered 2023-04-12: 500 mg via INTRAVENOUS
  Filled 2023-04-12: qty 500
  Filled 2023-04-12: qty 5

## 2023-04-12 MED ORDER — PHENYLEPHRINE HCL-NACL 20-0.9 MG/250ML-% IV SOLN
INTRAVENOUS | Status: AC
  Filled 2023-04-12: qty 250

## 2023-04-12 MED ORDER — HYDROMORPHONE HCL 1 MG/ML IJ SOLN
0.2000 mg | INTRAMUSCULAR | Status: DC | PRN
  Filled 2023-04-12: qty 1

## 2023-04-12 MED ORDER — ONDANSETRON HCL 4 MG/2ML IJ SOLN
INTRAMUSCULAR | Status: DC | PRN
  Administered 2023-04-12: 4 mg via INTRAVENOUS

## 2023-04-12 MED ORDER — PHENYLEPHRINE HCL-NACL 20-0.9 MG/250ML-% IV SOLN
INTRAVENOUS | Status: DC | PRN
  Administered 2023-04-12: 30 ug/min via INTRAVENOUS

## 2023-04-12 MED ORDER — DOCUSATE SODIUM 100 MG PO CAPS
100.0000 mg | ORAL_CAPSULE | Freq: Two times a day (BID) | ORAL | Status: DC
  Administered 2023-04-12 – 2023-04-15 (×8): 100 mg via ORAL
  Filled 2023-04-12 (×9): qty 1

## 2023-04-12 MED ORDER — PROPOFOL 1000 MG/100ML IV EMUL
INTRAVENOUS | Status: AC
  Filled 2023-04-12: qty 100

## 2023-04-12 MED ORDER — SODIUM CHLORIDE 0.9 % IV BOLUS
250.0000 mL | Freq: Once | INTRAVENOUS | Status: AC
  Administered 2023-04-12: 250 mL via INTRAVENOUS

## 2023-04-12 MED ORDER — CEFAZOLIN SODIUM-DEXTROSE 2-4 GM/100ML-% IV SOLN
INTRAVENOUS | Status: AC
  Filled 2023-04-12: qty 100

## 2023-04-12 MED ORDER — 0.9 % SODIUM CHLORIDE (POUR BTL) OPTIME
TOPICAL | Status: DC | PRN
  Administered 2023-04-12: 300 mL

## 2023-04-12 MED ORDER — ACETAMINOPHEN 500 MG PO TABS
ORAL_TABLET | ORAL | Status: AC
  Filled 2023-04-12: qty 2

## 2023-04-12 MED ORDER — ONDANSETRON HCL 4 MG/2ML IJ SOLN
4.0000 mg | Freq: Four times a day (QID) | INTRAMUSCULAR | Status: DC | PRN
  Administered 2023-04-12 – 2023-04-16 (×3): 4 mg via INTRAVENOUS
  Filled 2023-04-12 (×3): qty 2

## 2023-04-12 MED ORDER — BUPIVACAINE LIPOSOME 1.3 % IJ SUSP
INTRAMUSCULAR | Status: DC | PRN
  Administered 2023-04-12: 20 mL

## 2023-04-12 MED ORDER — ACETAMINOPHEN 10 MG/ML IV SOLN
INTRAVENOUS | Status: DC | PRN
  Administered 2023-04-12: 1000 mg via INTRAVENOUS

## 2023-04-12 MED ORDER — OXYCODONE HCL 5 MG PO TABS
5.0000 mg | ORAL_TABLET | ORAL | Status: DC | PRN
  Administered 2023-04-12: 10 mg via ORAL
  Filled 2023-04-12: qty 2

## 2023-04-12 MED ORDER — ACETAMINOPHEN 10 MG/ML IV SOLN
INTRAVENOUS | Status: AC
  Filled 2023-04-12: qty 100

## 2023-04-12 MED ORDER — PROPOFOL 500 MG/50ML IV EMUL
INTRAVENOUS | Status: DC | PRN
  Administered 2023-04-12: 30 ug/kg/min via INTRAVENOUS

## 2023-04-12 MED ORDER — FENTANYL CITRATE (PF) 100 MCG/2ML IJ SOLN
INTRAMUSCULAR | Status: AC
  Filled 2023-04-12: qty 2

## 2023-04-12 MED ORDER — METOCLOPRAMIDE HCL 5 MG PO TABS: 5.0000 mg | ORAL_TABLET | Freq: Three times a day (TID) | ORAL | Status: DC | PRN

## 2023-04-12 MED ORDER — BUPIVACAINE LIPOSOME 1.3 % IJ SUSP
INTRAMUSCULAR | Status: AC
  Filled 2023-04-12: qty 20

## 2023-04-12 MED ORDER — VITAMIN D 25 MCG (1000 UNIT) PO TABS
4000.0000 [IU] | ORAL_TABLET | Freq: Every day | ORAL | Status: DC
  Administered 2023-04-12 – 2023-04-16 (×5): 4000 [IU] via ORAL
  Filled 2023-04-12 (×5): qty 4

## 2023-04-12 MED ORDER — ONDANSETRON HCL 4 MG PO TABS: 4.0000 mg | ORAL_TABLET | Freq: Four times a day (QID) | ORAL | Status: DC | PRN

## 2023-04-12 MED ORDER — ALUM & MAG HYDROXIDE-SIMETH 200-200-20 MG/5ML PO SUSP
20.0000 mL | ORAL | Status: DC | PRN
  Administered 2023-04-14: 20 mL via ORAL
  Filled 2023-04-12: qty 30

## 2023-04-12 MED ORDER — BUPIVACAINE HCL (PF) 0.5 % IJ SOLN
INTRAMUSCULAR | Status: DC | PRN
  Administered 2023-04-12: 20 mL

## 2023-04-12 MED ORDER — FLEET ENEMA 7-19 GM/118ML RE ENEM: 1.0000 | ENEMA | Freq: Once | RECTAL | Status: DC | PRN

## 2023-04-12 MED ORDER — ACETAMINOPHEN 500 MG PO TABS
1000.0000 mg | ORAL_TABLET | Freq: Three times a day (TID) | ORAL | Status: DC
  Administered 2023-04-13 – 2023-04-16 (×9): 1000 mg via ORAL
  Filled 2023-04-12 (×11): qty 2

## 2023-04-12 MED ORDER — MIDODRINE HCL 5 MG PO TABS
5.0000 mg | ORAL_TABLET | Freq: Three times a day (TID) | ORAL | Status: DC
  Administered 2023-04-12 – 2023-04-13 (×4): 5 mg via ORAL
  Filled 2023-04-12 (×5): qty 1

## 2023-04-12 MED ORDER — GABAPENTIN 300 MG PO CAPS
300.0000 mg | ORAL_CAPSULE | Freq: Every day | ORAL | Status: DC
  Administered 2023-04-12 – 2023-04-15 (×4): 300 mg via ORAL
  Filled 2023-04-12 (×4): qty 1

## 2023-04-12 MED ORDER — PHENYLEPHRINE 80 MCG/ML (10ML) SYRINGE FOR IV PUSH (FOR BLOOD PRESSURE SUPPORT)
PREFILLED_SYRINGE | INTRAVENOUS | Status: DC | PRN
  Administered 2023-04-12: 160 ug via INTRAVENOUS

## 2023-04-12 MED ORDER — CEFAZOLIN SODIUM-DEXTROSE 2-4 GM/100ML-% IV SOLN
2.0000 g | INTRAVENOUS | Status: AC
  Administered 2023-04-12: 2 g via INTRAVENOUS

## 2023-04-12 MED ORDER — SODIUM CHLORIDE 0.9 % IV SOLN: INTRAVENOUS | Status: DC

## 2023-04-12 MED ORDER — ENOXAPARIN SODIUM 40 MG/0.4ML IJ SOSY
40.0000 mg | PREFILLED_SYRINGE | INTRAMUSCULAR | Status: DC
  Administered 2023-04-13 – 2023-04-16 (×4): 40 mg via SUBCUTANEOUS
  Filled 2023-04-12 (×4): qty 0.4

## 2023-04-12 MED ORDER — PROPOFOL 10 MG/ML IV BOLUS
INTRAVENOUS | Status: DC | PRN
  Administered 2023-04-12: 20 mg via INTRAVENOUS
  Administered 2023-04-12: 10 mg via INTRAVENOUS
  Administered 2023-04-12: 20 mg via INTRAVENOUS

## 2023-04-12 MED ORDER — POLYSACCHARIDE IRON COMPLEX 150 MG PO CAPS
150.0000 mg | ORAL_CAPSULE | Freq: Every day | ORAL | Status: DC
  Administered 2023-04-12 – 2023-04-16 (×5): 150 mg via ORAL
  Filled 2023-04-12 (×5): qty 1

## 2023-04-12 MED ORDER — SENNOSIDES-DOCUSATE SODIUM 8.6-50 MG PO TABS
1.0000 | ORAL_TABLET | Freq: Every evening | ORAL | Status: DC | PRN
  Administered 2023-04-14: 1 via ORAL
  Filled 2023-04-12: qty 1

## 2023-04-12 MED ORDER — VITAMIN B-12 1000 MCG PO TABS
1000.0000 ug | ORAL_TABLET | Freq: Every day | ORAL | Status: DC
  Administered 2023-04-12 – 2023-04-16 (×5): 1000 ug via ORAL
  Filled 2023-04-12 (×5): qty 1

## 2023-04-12 MED ORDER — METHOCARBAMOL 500 MG PO TABS
500.0000 mg | ORAL_TABLET | Freq: Four times a day (QID) | ORAL | Status: DC | PRN
  Administered 2023-04-13 – 2023-04-16 (×5): 500 mg via ORAL
  Filled 2023-04-12 (×6): qty 1

## 2023-04-12 MED ORDER — BISACODYL 10 MG RE SUPP
10.0000 mg | Freq: Every day | RECTAL | Status: DC | PRN
  Filled 2023-04-12: qty 1

## 2023-04-12 MED ORDER — TRANEXAMIC ACID-NACL 1000-0.7 MG/100ML-% IV SOLN
INTRAVENOUS | Status: AC
  Filled 2023-04-12: qty 100

## 2023-04-12 MED ORDER — CEFAZOLIN SODIUM-DEXTROSE 2-4 GM/100ML-% IV SOLN
2.0000 g | Freq: Four times a day (QID) | INTRAVENOUS | Status: AC
  Administered 2023-04-12 – 2023-04-13 (×3): 2 g via INTRAVENOUS
  Filled 2023-04-12 (×3): qty 100

## 2023-04-12 MED ORDER — NEOMYCIN-POLYMYXIN B GU 40-200000 IR SOLN
Status: AC
  Filled 2023-04-12: qty 20

## 2023-04-12 MED ORDER — FENTANYL CITRATE (PF) 100 MCG/2ML IJ SOLN
INTRAMUSCULAR | Status: DC | PRN
  Administered 2023-04-12 (×2): 25 ug via INTRAVENOUS

## 2023-04-12 MED ORDER — BUPIVACAINE HCL (PF) 0.5 % IJ SOLN
INTRAMUSCULAR | Status: AC
  Filled 2023-04-12: qty 30

## 2023-04-12 MED ORDER — KETOROLAC TROMETHAMINE 15 MG/ML IJ SOLN
7.5000 mg | Freq: Four times a day (QID) | INTRAMUSCULAR | Status: AC
  Administered 2023-04-12 – 2023-04-13 (×4): 7.5 mg via INTRAVENOUS
  Filled 2023-04-12 (×4): qty 1

## 2023-04-12 MED ORDER — METOCLOPRAMIDE HCL 5 MG/ML IJ SOLN: 5.0000 mg | Freq: Three times a day (TID) | INTRAMUSCULAR | Status: DC | PRN

## 2023-04-12 MED ORDER — PROPOFOL 10 MG/ML IV BOLUS
INTRAVENOUS | Status: AC
  Filled 2023-04-12: qty 20

## 2023-04-12 SURGICAL SUPPLY — 46 items
APL PRP STRL LF DISP 70% ISPRP (MISCELLANEOUS) ×1
BIT DRILL INTERTAN LAG SCREW (BIT) IMPLANT
BIT DRILL LONG 4.0 (BIT) IMPLANT
BLADE SURG 15 STRL LF DISP TIS (BLADE) ×2 IMPLANT
BLADE SURG 15 STRL SS (BLADE) ×1
CHLORAPREP W/TINT 26 (MISCELLANEOUS) ×2 IMPLANT
DRAPE 3/4 80X56 (DRAPES) ×2 IMPLANT
DRAPE SURG 17X11 SM STRL (DRAPES) ×4 IMPLANT
DRAPE U-SHAPE 47X51 STRL (DRAPES) ×4 IMPLANT
DRILL BIT LONG 4.0 (BIT) ×1
DRSG OPSITE POSTOP 3X4 (GAUZE/BANDAGES/DRESSINGS) ×6 IMPLANT
ELECT REM PT RETURN 9FT ADLT (ELECTROSURGICAL) ×1
ELECTRODE REM PT RTRN 9FT ADLT (ELECTROSURGICAL) ×2 IMPLANT
GLOVE BIOGEL PI IND STRL 8 (GLOVE) ×2 IMPLANT
GLOVE SURG SYN 7.5  E (GLOVE) ×1
GLOVE SURG SYN 7.5 E (GLOVE) ×1 IMPLANT
GLOVE SURG SYN 7.5 PF PI (GLOVE) ×2 IMPLANT
GOWN STRL REUS W/ TWL LRG LVL3 (GOWN DISPOSABLE) ×2 IMPLANT
GOWN STRL REUS W/ TWL XL LVL3 (GOWN DISPOSABLE) ×2 IMPLANT
GOWN STRL REUS W/TWL LRG LVL3 (GOWN DISPOSABLE) ×1
GOWN STRL REUS W/TWL XL LVL3 (GOWN DISPOSABLE) ×1
GUIDE PIN 3.2X343 (PIN) ×2
GUIDE PIN 3.2X343MM (PIN) ×2
KIT PATIENT CARE HANA TABLE (KITS) ×2 IMPLANT
KIT TURNOVER KIT A (KITS) ×2 IMPLANT
MANIFOLD NEPTUNE II (INSTRUMENTS) ×2 IMPLANT
MAT ABSORB  FLUID 56X50 GRAY (MISCELLANEOUS) ×2
MAT ABSORB FLUID 56X50 GRAY (MISCELLANEOUS) ×4 IMPLANT
NAIL TRIGEN INTERTAN 10X18CM (Nail) IMPLANT
NDL FILTER BLUNT 18X1 1/2 (NEEDLE) ×2 IMPLANT
NDL HYPO 22X1.5 SAFETY MO (MISCELLANEOUS) ×2 IMPLANT
NEEDLE FILTER BLUNT 18X1 1/2 (NEEDLE) ×1 IMPLANT
NEEDLE HYPO 22X1.5 SAFETY MO (MISCELLANEOUS) ×1 IMPLANT
NS IRRIG 1000ML POUR BTL (IV SOLUTION) ×2 IMPLANT
PACK HIP COMPR (MISCELLANEOUS) ×2 IMPLANT
PIN GUIDE 3.2X343MM (PIN) IMPLANT
SCREW LAG COMPR KIT 90/85 (Screw) IMPLANT
SCREW TRIGEN LOW PROF 5.0X30 (Screw) IMPLANT
STAPLER SKIN PROX 35W (STAPLE) ×2 IMPLANT
STOCKINETTE IMPERVIOUS 9X36 MD (GAUZE/BANDAGES/DRESSINGS) IMPLANT
SUT VIC AB 2-0 CT2 27 (SUTURE) ×2 IMPLANT
SYR 10ML LL (SYRINGE) ×2 IMPLANT
SYR 30ML LL (SYRINGE) ×2 IMPLANT
TAPE CLOTH 3X10 WHT NS LF (GAUZE/BANDAGES/DRESSINGS) ×4 IMPLANT
TRAP FLUID SMOKE EVACUATOR (MISCELLANEOUS) ×2 IMPLANT
WATER STERILE IRR 500ML POUR (IV SOLUTION) ×2 IMPLANT

## 2023-04-12 NOTE — Assessment & Plan Note (Signed)
Likely musculoskeletal.  CT cervical spine negative.

## 2023-04-12 NOTE — H&P (Signed)
H&P reviewed. No significant changes noted.  

## 2023-04-12 NOTE — Assessment & Plan Note (Signed)
Creatinine creatinine 1.26 with a GFR 41.

## 2023-04-12 NOTE — Anesthesia Procedure Notes (Signed)
Spinal  Patient location during procedure: OR Start time: 04/12/2023 8:42 AM End time: 04/12/2023 8:55 AM Reason for block: surgical anesthesia Staffing Performed: anesthesiologist  Anesthesiologist: Foye Deer, MD Performed by: Foye Deer, MD Authorized by: Foye Deer, MD   Preanesthetic Checklist Completed: patient identified, IV checked, site marked, risks and benefits discussed, surgical consent, monitors and equipment checked, pre-op evaluation and timeout performed Spinal Block Patient position: right lateral decubitus Prep: DuraPrep Patient monitoring: heart rate, cardiac monitor, continuous pulse ox and blood pressure Approach: right paramedian Location: L3-4 Injection technique: single-shot Needle Needle type: Sprotte  Needle gauge: 24 G Needle length: 9 cm Assessment Sensory level: T10 Events: CSF return Additional Notes Inability to access space midline.

## 2023-04-12 NOTE — Op Note (Signed)
DATE OF SURGERY: 04/12/2023  PREOPERATIVE DIAGNOSIS: Left intertrochanteric hip fracture  POSTOPERATIVE DIAGNOSIS: Left intertrochanteric hip fracture  PROCEDURE: Intramedullary nailing of Left femur with cephalomedullary device  SURGEON: Rosealee Albee, MD  ANESTHESIA: spinal  EBL: 100 cc  IVF: per anesthesia record  COMPONENTS:  [employer] Tr[employer] Intertan Short Nail: 10x160mm; 90mm lag screw with 85mm compression screw; 5x 30mm distal cortical interlocking screw  INDICATIONS: Julie [name] is a 89 y.o. female who sustained an intertrochanteric fracture after a fall. Risks and benefits of intramedullary nailing were explained to the patient and/or family . Risks include but are not limited to bleeding, infection, injury to tissues, nerves, vessels, nonunion/malunion, hardware failure, limb length discrepancy/hip rotation mismatch and risks of anesthesia. The patient and/or family understand these risks, have completed an informed consent, and wish to proceed.   PROCEDURE:  The patient was brought into the operating room. After administering anesthesia, the patient was placed in the supine position on the Hana table. The uninjured leg was placed in an extended position while the injured lower extremity was placed in longitudinal traction. The fracture was reduced using longitudinal traction and internal rotation. The adequacy of reduction was verified fluoroscopically in AP and lateral projections and found to be acceptable. The lateral aspect of the hip and thigh were prepped with ChloraPrep solution before being draped sterilely. Preoperative IV antibiotics were administered. A timeout was performed to verify the appropriate surgical site, patient, and procedure.    The greater trochanter was identified and an approximately 6 cm incision was made about 3 fingerbreadths above the tip of the greater trochanter. The incision was carried down through the subcutaneous tissues to expose  the gluteal fascia. This was split the length of the incision, providing access to the tip of the trochanter. Under fluoroscopic guidance, a guidewire was drilled through the tip of the trochanter into the proximal metaphysis to the level of the lesser trochanter. After verifying its position fluoroscopically in AP and lateral projections, it was overreamed with the opening reamer to the level of the lesser trochanter. The nail was selected and advanced to the appropriate depth as verified fluoroscopically.    The guide system for the lag screw was positioned and advanced through an approximately 5cm incision over the lateral aspect of the proximal femur. The guidewire was drilled up through the femoral nail and into the femoral neck to rest within 5 mm of subchondral bone. After verifying its position in the femoral neck and head in both AP and lateral projections, the guidewire was measured and appropriate sized lag screw was selected.  The channel for the compression screw was drilled and antirotation bar was placed.  Lag screw was drilled and placed in appropriate position.  Compression screw was then placed.  Appropriate compression was achieved.  The set screw was locked in place. Again, the adequacy of hardware position and fracture reduction was verified fluoroscopically in AP and lateral projections.   Attention was then turned to the distal interlocking screw in the diaphysis. Using a targeted assembly, a stab incision was made and hole was drilled through the nail. An interlocking screw was placed with excellent purchase.  Appropriate screw position was verified fluoroscopically in AP and lateral projections.   The wounds were irrigated thoroughly with sterile saline solution. Local anesthetic was injected into the wounds. Deep fascia was closed with 2-0 Vicryl. The subcutaneous tissues were closed using 2-0 Vicryl interrupted sutures. The skin was closed using staples. Sterile occlusive dressings  were applied to all wounds. The patient was then transferred to the recovery room in satisfactory condition.   POSTOPERATIVE PLAN: The patient will be WBAT on the operative extremity. Lovenox 40mg /day x 4 weeks to start on POD#1. Perioperative IV antibiotics x 24 hours. PT/OT on POD#1.

## 2023-04-12 NOTE — Assessment & Plan Note (Signed)
Appears at baseline with parkinsonism

## 2023-04-12 NOTE — Progress Notes (Signed)
OT Cancellation Note  Patient Details Name: Julie [name] MRN: [id number] DOB: November 08, 1932   Cancelled Treatment:    Reason Eval/Treat Not Completed: Patient not medically ready. OT orders received, chart reviewed. Pt admitted following a fall resulting in traumatic injury/fx. Pt is scheduled for operative repair today. Will complete current orders. Please re-consult when pt medically stable and able to participate.  Gerrie Nordmann 04/12/2023, 8:26 AM

## 2023-04-12 NOTE — Hospital Course (Signed)
89 y.o. female with medical history significant of parkinsonism, GERD, weakness presenting with fall and left hip fracture.  Limited history in the setting of parkinsonism and likely baseline dementia.  History primarily from patient's neighbors.  Per report, patient fell on her way to breakfast on her walker.  Patient fell on her left side.  No reported head trauma loss conscious.  Per the neighbors, patient lives at home with a caregiver thick checks on patient throughout the week.  No reports of chest pain, shortness of breath, nausea or vomiting.  No reported dizziness or weakness prior to fall.  Patient with baseline unsteady gait. Presented to the ER afebrile, blood pressure 120s to 180s over 60s to 100s.  Satting well on room air.  White count 7.4, hemoglobin 12.6, platelets 216.  Creatinine 1.05.  CT head and CT C-spine grossly stable.  Chest x-ray stable.  Hip plain films with comminuted left intertrochanteric femoral neck fracture with associated varus angulation.  5/12.  Dr. Allena Katz took for a intramedullary nailing of left femur with cephalomedullary device. 5/13.  Had acute delirium overnight with oxycodone and IV Dilaudid.  Will change to as needed IV morphine low-dose and tramadol.  The quicker we can get over to Tylenol but better. 5/14.  Still having issues with pain medications.  The quicker to convert to Tylenol the better.  Continue low-dose IV morphine prior to PT and low-dose tramadol.

## 2023-04-12 NOTE — Transfer of Care (Signed)
Immediate Anesthesia Transfer of Care Note  Patient: Julie [name]  Procedure(s) Performed: INTRAMEDULLARY (IM) NAIL INTERTROCHANTERIC (Left: Leg Upper)  Patient Location: PACU  Anesthesia Type:Spinal  Level of Consciousness: drowsy  Airway & Oxygen Therapy: Patient Spontanous Breathing and Patient connected to face mask oxygen  Post-op Assessment: Report given to RN  Post vital signs: stable  Last Vitals:  Vitals Value Taken Time  BP 97/54 04/12/23 1006  Temp    Pulse 78 04/12/23 1008  Resp 12 04/12/23 1008  SpO2 100 % 04/12/23 1008  Vitals shown include unvalidated device data.  Last Pain:  Vitals:   04/12/23 0500  TempSrc:   PainSc: Asleep      Patients Stated Pain Goal: 5 (04/11/23 2121)  Complications: No notable events documented.

## 2023-04-12 NOTE — Anesthesia Postprocedure Evaluation (Signed)
Anesthesia Post Note  Patient: Julie [name]  Procedure(s) Performed: INTRAMEDULLARY (IM) NAIL INTERTROCHANTERIC (Left: Leg Upper)  Patient location during evaluation: PACU Anesthesia Type: Spinal Level of consciousness: oriented and awake and alert Pain management: pain level controlled Vital Signs Assessment: post-procedure vital signs reviewed and stable Respiratory status: spontaneous breathing and respiratory function stable Cardiovascular status: blood pressure returned to baseline and stable Postop Assessment: no headache, no backache, no apparent nausea or vomiting and spinal receding Anesthetic complications: no   No notable events documented.   Last Vitals:  Vitals:   04/12/23 1531 04/12/23 2006  BP: (!) 114/54 (!) 123/47  Pulse: 75 75  Resp: 16 14  Temp: 37 C 36.8 C  SpO2: 98% 93%    Last Pain:  Vitals:   04/12/23 2028  TempSrc:   PainSc: 8                  Jayanna Kroeger Romie Minus

## 2023-04-12 NOTE — Assessment & Plan Note (Signed)
On gabapentin.  

## 2023-04-12 NOTE — Progress Notes (Signed)
Progress Note   Patient: Julie [name]:[id number] DOB: 07-15-1932 DOA: 04/11/2023     1 DOS: the patient was seen and examined on 04/12/2023   Brief hospital course: 89 y.o. female with medical history significant of parkinsonism, GERD, weakness presenting with fall and left hip fracture.  Limited history in the setting of parkinsonism and likely baseline dementia.  History primarily from patient's neighbors.  Per report, patient fell on her way to breakfast on her walker.  Patient fell on her left side.  No reported head trauma loss conscious.  Per the neighbors, patient lives at home with a caregiver thick checks on patient throughout the week.  No reports of chest pain, shortness of breath, nausea or vomiting.  No reported dizziness or weakness prior to fall.  Patient with baseline unsteady gait. Presented to the ER afebrile, blood pressure 120s to 180s over 60s to 100s.  Satting well on room air.  White count 7.4, hemoglobin 12.6, platelets 216.  Creatinine 1.05.  CT head and CT C-spine grossly stable.  Chest x-ray stable.  Hip plain films with comminuted left intertrochanteric femoral neck fracture with associated varus angulation.  5/12.  Dr. Allena Katz took for a intramedullary nailing of left femur with cephalomedullary device.  Assessment and Plan: * Closed hip fracture requiring operative repair, left, sequela OR on 5/12 for intramedullary nailing of left femur with cephalomedullary device by Dr. Allena Katz.  Pain control.  Neck pain Likely musculoskeletal.  CT cervical spine negative.  CKD stage 3a, GFR 45-59 ml/min (HCC) Creatinine 1.05 with a GFR 51  Fall Mechanical fall w/ no associated weakness or dizziness   Parkinsonism Appears at baseline with parkinsonism   Neuropathy On gabapentin        Subjective: Patient seen this morning prior to surgery.  Patient had a lot of pain in her hip.  Also having some neck discomfort but more left lateral.  No chest pain.  Some  shortness of breath.  Noted after a fall found to have a left hip fracture.  Physical Exam: Vitals:   04/12/23 1045 04/12/23 1100 04/12/23 1104 04/12/23 1106  BP: (!) 100/59 (!) 92/56    Pulse: 80 79 78   Resp: (!) 7 (!) 7 11   Temp:    99.3 F (37.4 C)  TempSrc:      SpO2: 93% 95% 96%   Weight:      Height:       Physical Exam HENT:     Head: Normocephalic.  Eyes:     General: Lids are normal.     Conjunctiva/sclera: Conjunctivae normal.  Cardiovascular:     Rate and Rhythm: Normal rate and regular rhythm.     Heart sounds: Normal heart sounds, S1 normal and S2 normal.  Pulmonary:     Breath sounds: No decreased breath sounds, wheezing, rhonchi or rales.  Abdominal:     Palpations: Abdomen is soft.     Tenderness: There is no abdominal tenderness.  Musculoskeletal:     Right lower leg: No swelling.     Left lower leg: No swelling.     Comments: Left leg shortened and externally rotated  Skin:    General: Skin is warm.     Findings: No rash.  Neurological:     Mental Status: She is alert and oriented to person, place, and time.     Comments: Able to wiggle toes and sensation intact to light touch for left foot.     Data Reviewed: Creatinine  1.05, hemoglobin 12.6, CT scan of the head and neck negative, left intertrochanteric femoral neck fracture with associated varus angulation  Family Communication: Spoke with daughter at the bedside  Disposition: Status is: Inpatient Remains inpatient appropriate because: OR today  Planned Discharge Destination: Rehab.  Patient's daughter interested in rehab in [place]    Time spent: 28 minutes  Author: Alford Highland, MD 04/12/2023 11:24 AM  For on call review [email or web address].

## 2023-04-12 NOTE — Progress Notes (Signed)
PT Cancellation Note  Patient Details Name: Julie [name] MRN: [id number] DOB: 1932/06/20   Cancelled Treatment:    Reason Eval/Treat Not Completed: Other (comment). Orders received for PT consult. Pt scheduled for operative repair this date. Will complete current orders. Please re-order PT when pt medically stable and able to participate; including WBing status.   Hughes Wyndham 04/12/2023, 8:22 AM Elizabeth Palau, PT, DPT, GCS [phone]

## 2023-04-12 NOTE — Assessment & Plan Note (Addendum)
OR on 5/12 for intramedullary nailing of left femur with cephalomedullary device by Dr. Allena Katz.  With acute delirium with Dilaudid and oxycodone switched pain medications over to a low-dose IV morphine and tramadol to see if she is able to handle that better.  The quicker we can switch to Tylenol the better.

## 2023-04-13 ENCOUNTER — Other Ambulatory Visit: Payer: Self-pay

## 2023-04-13 ENCOUNTER — Encounter: Payer: Self-pay | Admitting: Orthopedic Surgery

## 2023-04-13 DIAGNOSIS — R410 Disorientation, unspecified: Secondary | ICD-10-CM | POA: Diagnosis not present

## 2023-04-13 DIAGNOSIS — R710 Precipitous drop in hematocrit: Secondary | ICD-10-CM | POA: Diagnosis not present

## 2023-04-13 DIAGNOSIS — I9589 Other hypotension: Secondary | ICD-10-CM

## 2023-04-13 DIAGNOSIS — S72002A Fracture of unspecified part of neck of left femur, initial encounter for closed fracture: Secondary | ICD-10-CM | POA: Diagnosis not present

## 2023-04-13 DIAGNOSIS — I959 Hypotension, unspecified: Secondary | ICD-10-CM

## 2023-04-13 LAB — CBC
HCT: 27 % — ABNORMAL LOW (ref 36.0–46.0)
Hemoglobin: 8.9 g/dL — ABNORMAL LOW (ref 12.0–15.0)
MCH: 30.1 pg (ref 26.0–34.0)
MCHC: 33 g/dL (ref 30.0–36.0)
MCV: 91.2 fL (ref 80.0–100.0)
Platelets: 174 10*3/uL (ref 150–400)
RBC: 2.96 MIL/uL — ABNORMAL LOW (ref 3.87–5.11)
RDW: 14 % (ref 11.5–15.5)
WBC: 8.2 10*3/uL (ref 4.0–10.5)
nRBC: 0 % (ref 0.0–0.2)

## 2023-04-13 LAB — BASIC METABOLIC PANEL
Anion gap: 8 (ref 5–15)
BUN: 24 mg/dL — ABNORMAL HIGH (ref 8–23)
CO2: 23 mmol/L (ref 22–32)
Calcium: 7.1 mg/dL — ABNORMAL LOW (ref 8.9–10.3)
Chloride: 105 mmol/L (ref 98–111)
Creatinine, Ser: 1.26 mg/dL — ABNORMAL HIGH (ref 0.44–1.00)
GFR, Estimated: 41 mL/min — ABNORMAL LOW (ref >60)
Glucose, Bld: 83 mg/dL (ref 70–99)
Potassium: 3.7 mmol/L (ref 3.5–5.1)
Sodium: 136 mmol/L (ref 135–145)

## 2023-04-13 LAB — URINALYSIS, COMPLETE (UACMP) WITH MICROSCOPIC
Bilirubin Urine: NEGATIVE
Glucose, UA: NEGATIVE mg/dL
Ketones, ur: NEGATIVE mg/dL
Nitrite: NEGATIVE
Protein, ur: NEGATIVE mg/dL
Specific Gravity, Urine: <1.005 — ABNORMAL LOW (ref 1.005–1.030)
pH: 6 (ref 5.0–8.0)

## 2023-04-13 MED ORDER — TRAMADOL HCL 50 MG PO TABS
50.0000 mg | ORAL_TABLET | Freq: Two times a day (BID) | ORAL | Status: DC | PRN
  Administered 2023-04-13 – 2023-04-16 (×4): 50 mg via ORAL
  Filled 2023-04-13 (×5): qty 1

## 2023-04-13 MED ORDER — MORPHINE SULFATE (PF) 2 MG/ML IV SOLN
0.5000 mg | INTRAVENOUS | Status: DC | PRN
  Administered 2023-04-13 – 2023-04-14 (×2): 0.5 mg via INTRAVENOUS
  Filled 2023-04-13 (×3): qty 1

## 2023-04-13 MED ORDER — MIDODRINE HCL 5 MG PO TABS
2.5000 mg | ORAL_TABLET | Freq: Three times a day (TID) | ORAL | Status: DC
  Administered 2023-04-13 – 2023-04-15 (×6): 2.5 mg via ORAL
  Filled 2023-04-13 (×6): qty 1

## 2023-04-13 NOTE — Progress Notes (Signed)
AT 1248 Received verbal order from Dr. Renae Gloss to place external catheter for this patient. Pt does not sense when she has to void and has refused mobility at this time. Pt voided in bed x 2. Pt reports history of UTIs and not being able to tell when she has to void.  Pt is AO x 4.

## 2023-04-13 NOTE — Progress Notes (Signed)
Progress Note   Patient: Julie [name] ZOX:[id number] DOB: [birth year] DOA: 04/11/2023     2 DOS: the patient was seen and examined on 04/13/2023   Brief hospital course: 89 y.o. female with medical history significant of parkinsonism, GERD, weakness presenting with fall and left hip fracture.  Limited history in the setting of parkinsonism and likely baseline dementia.  History primarily from patient's neighbors.  Per report, patient fell on her way to breakfast on her walker.  Patient fell on her left side.  No reported head trauma loss conscious.  Per the neighbors, patient lives at home with a caregiver thick checks on patient throughout the week.  No reports of chest pain, shortness of breath, nausea or vomiting.  No reported dizziness or weakness prior to fall.  Patient with baseline unsteady gait. Presented to the ER afebrile, blood pressure 120s to 180s over 60s to 100s.  Satting well on room air.  White count 7.4, hemoglobin 12.6, platelets 216.  Creatinine 1.05.  CT head and CT C-spine grossly stable.  Chest x-ray stable.  Hip plain films with comminuted left intertrochanteric femoral neck fracture with associated varus angulation.  5/12.  Dr. Allena Katz took for a intramedullary nailing of left femur with cephalomedullary device. 5/13.  Had acute delirium overnight with oxycodone and IV Dilaudid.  Will change to as needed IV morphine low-dose and tramadol.  The quicker we can get over to Tylenol but better.  Assessment and Plan: * Closed hip fracture requiring operative repair, left, sequela OR on 5/12 for intramedullary nailing of left femur with cephalomedullary device by Dr. Allena Katz.  With acute delirium with Dilaudid and oxycodone will switch pain medications over to a low-dose IV morphine and tramadol to see if she is able to handle that better.  Acute delirium Could be secondary to anesthesia and/or pain medications.  Could also be secondary to pain and being in an unfamiliar environment.   The quicker we can switch over to Tylenol the better.  Hypotension Yesterday patient was given fluid bolus secondary to hypotension.  Started on midodrine.  Drop in hemoglobin Likely post surgical anemia.  Hemoglobin also diluted down from vigorous IV fluids yesterday with hypotension.  CKD stage 3a, GFR 45-59 ml/min (HCC) Creatinine creatinine 1.26 with a GFR 41.  Neuropathy On gabapentin  Parkinsonism Appears at baseline with parkinsonism   Neck pain Likely musculoskeletal.  CT cervical spine negative.  Fall Mechanical fall w/ no associated weakness or dizziness         Subjective: Patient in a lot of pain.  Had acute delirium with oxycodone and IV Dilaudid overnight.  Admitted after fall and hip fracture.  Physical Exam: Vitals:   04/13/23 0053 04/13/23 0402 04/13/23 0807 04/13/23 1200  BP: (!) 120/59 (!) 139/52 (!) 122/50 (!) 140/44  Pulse: 79 81 80 79  Resp: 18 16 17 17   Temp: 98.2 F (36.8 C) 98.2 F (36.8 C) 98.8 F (37.1 C) 98.4 F (36.9 C)  TempSrc:      SpO2: 92% 93% 94% 93%  Weight:      Height:       Physical Exam HENT:     Head: Normocephalic.  Eyes:     General: Lids are normal.     Conjunctiva/sclera: Conjunctivae normal.  Cardiovascular:     Rate and Rhythm: Normal rate and regular rhythm.     Heart sounds: Normal heart sounds, S1 normal and S2 normal.  Pulmonary:     Breath sounds: No decreased breath  sounds, wheezing, rhonchi or rales.  Abdominal:     Palpations: Abdomen is soft.     Tenderness: There is no abdominal tenderness.  Musculoskeletal:     Right lower leg: No swelling.     Left lower leg: No swelling.  Skin:    General: Skin is warm.     Findings: No rash.  Neurological:     Mental Status: She is alert and oriented to person, place, and time.     Comments: Able to wiggle toes and sensation intact to light touch for left foot.     Data Reviewed: Hemoglobin 8.9, creatinine 1.26  Family Communication: Spoke with  daughter at bedside  Disposition: Status is: Inpatient Remains inpatient appropriate because: Trying to get better pain control but does not cause delirium.  Planned Discharge Destination: Rehab    Time spent: 29 minutes Case discussed with [facility] and nursing staff  Author: Alford Highland, MD 04/13/2023 2:23 PM  For on call review [email or web address].

## 2023-04-13 NOTE — TOC Progression Note (Signed)
Transition of Care [facility]) - Progression Note    Patient Details  Name: Julie [name] MRN: [id number] Date of Birth: [birth year]  Transition of [facility]) CM/SW Contact  Marlowe Sax, RN Phone Number: 04/13/2023, 3:09 PM  Clinical Narrative:    Spoke with the patient and her caregiver I explained that I do not have any facilities that we work with the the electronic Hub in [place], If they would like to have a bed in [place] I need them to provide me with a few choices, I explained the process of having to reach out to the facilities and then faxing or emailing the Referral waiting to hear back if they have a bed to offer and then getting ins approval She stated that she has been to one locally before She is agreeable with a bed search in the local facilities and will determine which facility that she wants to go to   Obtained PASSR, Completed FL2 sent bedsearch        Expected Discharge Plan and Services                                               Social Determinants of Health (SDOH) Interventions SDOH Screenings   Food Insecurity: No Food Insecurity (04/12/2023)  Housing: Low Risk  (04/12/2023)  Transportation Needs: No Transportation Needs (04/12/2023)  Utilities: Not At Risk (04/12/2023)  Alcohol Screen: Low Risk  (05/29/2022)  Depression (PHQ2-9): Medium Risk (01/19/2023)  Financial Resource Strain: Low Risk  (05/29/2022)  Physical Activity: Insufficiently Active (05/29/2022)  Social Connections: Moderately Integrated (05/29/2022)  Stress: No Stress Concern Present (05/29/2022)  Tobacco Use: Medium Risk (04/11/2023)    Readmission Risk Interventions     No data to display

## 2023-04-13 NOTE — Evaluation (Signed)
Occupational Therapy Evaluation Patient Details Name: Julie [name] MRN: [id number] DOB: 03-16-1932 Today's Date: 04/13/2023   History of Present Illness Pt is an 89 y/o F admitted on 04/11/23 after presenting with c/o fall & found to have L hip fx. Pt is s/p IM nailing of L femur on 04/12/23. PMH: Parkinsonism, GERD, weakness   Clinical Impression   Patient presenting with decreased Ind in self care,balance, functional mobility/transfer, endurance, and safety awareness. Patient report's living at [facility]. Caregiver present in room that reports she provides minimal assistance for bathing and dressing and takes pt into community. Pt ambulates with AD at baseline. Pt is laying in bed smiling but when roll to the R is attempted, with all 4 rails up, pt becomes resistive to therapist requiring total A. Pt placed into trendelenburg to reposition and cries out in pain  as well as when therapist uses bed functions to shorten mattress. None of these things involved physical touch. It appears that pt is very fearful of falling as well as the anticipation of feeling pain. Patient will benefit from acute OT to increase overall independence in the areas of ADLs, functional mobility, and safety awareness  in order to safely discharge.     Recommendations for follow up therapy are one component of a multi-disciplinary discharge planning process, led by the attending physician.  Recommendations may be updated based on patient status, additional functional criteria and insurance authorization.   Assistance Recommended at Discharge Frequent or constant Supervision/Assistance  Patient can return home with the following Two people to help with bathing/dressing/bathroom;Two people to help with walking and/or transfers;Assistance with cooking/housework;Assist for transportation;Help with stairs or ramp for entrance;Direct supervision/assist for financial management;Direct supervision/assist for medications  management    Functional Status Assessment  Patient has had a recent decline in their functional status and demonstrates the ability to make significant improvements in function in a reasonable and predictable amount of time.  Equipment Recommendations  Other (comment) (defer to next venue of care)       Precautions / Restrictions Precautions Precautions: Fall Restrictions Weight Bearing Restrictions: Yes LLE Weight Bearing: Weight bearing as tolerated      Mobility Bed Mobility Overal bed mobility: Needs Assistance Bed Mobility: Rolling Rolling: Total assist         General bed mobility comments: Pt becoming resistive in bed and stiffens arm in bed rail and working against therapist attempting to roll her onto non operative side.    Transfers                   General transfer comment: not attempted      Balance                                           ADL either performed or assessed with clinical judgement   ADL Overall ADL's : Needs assistance/impaired                                       General ADL Comments: total A for LB self care. Pt unable to attempt functional transfers.     Vision Patient Visual Report: No change from baseline              Pertinent Vitals/Pain Pain Assessment Pain Assessment: Faces Faces Pain  Scale: Hurts worst Pain Location: L hip Pain Descriptors / Indicators: Discomfort, Grimacing, Guarding Pain Intervention(s): Monitored during session, Repositioned, Other (comment) (RN arrives during session to assess pain)     Hand Dominance Right   Extremity/Trunk Assessment Upper Extremity Assessment Upper Extremity Assessment: Generalized weakness;LUE deficits/detail LUE Deficits / Details: Pt reports soreness from fall and limited shoulder elevation from discomfort. OT moving UE through all planes over movement passively.   Lower Extremity Assessment Lower Extremity Assessment:  Defer to PT evaluation       Communication Communication Communication: No difficulties   Cognition Arousal/Alertness: Awake/alert Behavior During Therapy: WFL for tasks assessed/performed Overall Cognitive Status: Within Functional Limits for tasks assessed                                 General Comments: Pt is AxO to self, situation, location. Pt is very fearful of falling and hallucinating in room asking about bees being on the wall.                Home Living Family/patient expects to be discharged to:: Other (Comment) (ILF apartment at [facility]) Living Arrangements: Alone Available Help at Discharge: Personal care attendant Type of Home: Apartment Home Access: Level entry;Elevator     Home Layout: One level               Home Equipment: Rollator (4 wheels)   Additional Comments: Personal care aide 10am-2pm 5 days/week to take her out into the community and assist with self care as needed.      Prior Functioning/Environment Prior Level of Function : Needs assist             Mobility Comments: Ambulatory with rollator, notes 7 falls in the past 6 months. ADLs Comments: Assistance from someone for bathing, otherwise pt occasionally spone bathes on her own.        OT Problem List: Decreased strength;Decreased range of motion;Decreased cognition;Decreased activity tolerance;Decreased safety awareness;Impaired balance (sitting and/or standing);Decreased knowledge of use of DME or AE      OT Treatment/Interventions: Self-care/ADL training;Therapeutic exercise;Therapeutic activities;DME and/or AE instruction;Patient/family education;Balance training    OT Goals(Current goals can be found in the care plan section) Acute Rehab OT Goals Patient Stated Goal: to decrease pain OT Goal Formulation: With patient Time For Goal Achievement: 04/27/23 Potential to Achieve Goals: Good ADL Goals Pt Will Perform Grooming: sitting;with min guard  assist Pt Will Perform Lower Body Dressing: with mod assist;sit to/from stand Pt Will Transfer to Toilet: with mod assist;stand pivot transfer Pt Will Perform Toileting - Clothing Manipulation and hygiene: with mod assist;sit to/from stand  OT Frequency: Min 2X/week       AM-PAC OT "6 Clicks" Daily Activity     Outcome Measure Help from another person eating meals?: None Help from another person taking care of personal grooming?: None Help from another person toileting, which includes using toliet, bedpan, or urinal?: Total Help from another person bathing (including washing, rinsing, drying)?: Total Help from another person to put on and taking off regular upper body clothing?: A Lot Help from another person to put on and taking off regular lower body clothing?: Total 6 Click Score: 13   End of Session Nurse Communication: Mobility status;Patient requests pain meds  Activity Tolerance: Patient limited by pain Patient left: in bed;with call bell/phone within reach;with family/visitor present;with nursing/sitter in room;with bed alarm set  OT Visit Diagnosis: Unsteadiness on  feet (R26.81);Repeated falls (R29.6);Muscle weakness (generalized) (M62.81)                Time: 1610-9604 OT Time Calculation (min): 20 min Charges:  OT General Charges $OT Visit: 1 Visit OT Evaluation $OT Eval Moderate Complexity: 1 Mod OT Treatments $Therapeutic Activity: 8-22 mins  Jackquline Denmark, MS, OTR/L , CBIS ascom [phone]  04/13/23, 3:37 PM

## 2023-04-13 NOTE — Evaluation (Signed)
Physical Therapy Evaluation Patient Details Name: Julie [name] MRN: [id number] DOB: May 02, 1932 Today's Date: 04/13/2023  History of Present Illness  Pt is an 89 y/o F admitted on 04/11/23 after presenting with c/o fall & found to have L hip fx. Pt is s/p IM nailing of L femur on 04/12/23. PMH: Parkinsonism, GERD, weakness  Clinical Impression  Pt seen for PT evaluation with pt received in bed, daughter Julie [name]) in room. Prior to admission pt was ambulatory with rollator but endorses 7 falls in the past 6 months. Pt had someone assist her with bathing, otherwise pt sponge bathed & pt had personal care aide 4 hours/day 5 days/week that took her out in the community. On this date, pt c/o significant pain in L hip despite being premedicated. PT educated on importance of mobility to help reduce risk of bed sores, blood clots, and further functional decline. Pt performed 5 reps of LLE ankle pumps with assistance but resisting movement when PT attempted to assist her with heel slides. Pt repeatedly states "you're not going to hurt me" despite PT educating pt on purpose of mobility & daughter reiterating this. Pt unable to fully lift RLE from bed to allow PT to place pillow underneath. Nurse reports she will request night shift premedicate her tomorrow AM in preparation for therapy as well. Recommend ongoing PT services to address activity tolerance, strengthening, & promote mobility.       Recommendations for follow up therapy are one component of a multi-disciplinary discharge planning process, led by the attending physician.  Recommendations may be updated based on patient status, additional functional criteria and insurance authorization.  Follow Up Recommendations Can patient physically be transported by private vehicle: No     Assistance Recommended at Discharge Frequent or constant Supervision/Assistance  Patient can return home with the following  Two people to help with walking and/or  transfers;Two people to help with bathing/dressing/bathroom;Help with stairs or ramp for entrance;Assist for transportation;Direct supervision/assist for financial management;Assistance with cooking/housework    Equipment Recommendations None recommended by PT (TBD in next venue)  Recommendations for Other Services       Functional Status Assessment Patient has had a recent decline in their functional status and demonstrates the ability to make significant improvements in function in a reasonable and predictable amount of time.     Precautions / Restrictions Precautions Precautions: Fall Restrictions Weight Bearing Restrictions: Yes LLE Weight Bearing: Weight bearing as tolerated      Mobility  Bed Mobility                    Transfers                        Ambulation/Gait                  Stairs            Wheelchair Mobility    Modified Rankin (Stroke Patients Only)       Balance                                             Pertinent Vitals/Pain Pain Assessment Pain Assessment: Faces Faces Pain Scale: Hurts worst Pain Location: L hip Pain Descriptors / Indicators: Discomfort, Grimacing, Guarding Pain Intervention(s): Premedicated before session, Repositioned, Limited activity within patient's tolerance, Monitored during session, RN gave  pain meds during session    Home Living Family/patient expects to be discharged to:: Other (Comment) (ILF apartment) Living Arrangements: Alone Available Help at Discharge: Personal care attendant Type of Home: Apartment Home Access: Level entry;Elevator       Home Layout: One level Home Equipment: Rollator (4 wheels) Additional Comments: Personal care aide 10am-2pm 5 days/week to take her out into the community.    Prior Function               Mobility Comments: Ambulatory with rollator, notes 7 falls in the past 6 months. ADLs Comments: Assistance from someone  for bathing, otherwise pt occasionally spone bathes on her own.     Hand Dominance        Extremity/Trunk Assessment   Upper Extremity Assessment Upper Extremity Assessment: Generalized weakness    Lower Extremity Assessment Lower Extremity Assessment: RLE deficits/detail;LLE deficits/detail RLE: Unable to fully assess due to pain LLE: Unable to fully assess due to pain       Communication   Communication: No difficulties  Cognition Arousal/Alertness: Awake/alert Behavior During Therapy: WFL for tasks assessed/performed Overall Cognitive Status: Within Functional Limits for tasks assessed                                 General Comments: Pt is AxO to self, situation, location. Perseverative on pain.        General Comments      Exercises Total Joint Exercises Ankle Circles/Pumps: PROM, AAROM, Supine, Left, 5 reps   Assessment/Plan    PT Assessment Patient needs continued PT services  PT Problem List Decreased strength;Pain;Decreased range of motion;Decreased balance;Decreased safety awareness;Decreased activity tolerance;Decreased knowledge of precautions;Decreased mobility;Decreased knowledge of use of DME       PT Treatment Interventions DME instruction;Wheelchair mobility training;Gait training;Balance training;Therapeutic exercise;Neuromuscular re-education;Functional mobility training;Patient/family education;Therapeutic activities;Modalities    PT Goals (Current goals can be found in the Care Plan section)  Acute Rehab PT Goals Patient Stated Goal: decreased pain PT Goal Formulation: With patient Time For Goal Achievement: 04/27/23 Potential to Achieve Goals: Fair    Frequency 7X/week     Co-evaluation               AM-PAC PT "6 Clicks" Mobility  Outcome Measure Help needed turning from your back to your side while in a flat bed without using bedrails?: A Lot Help needed moving from lying on your back to sitting on the side of a  flat bed without using bedrails?: Total Help needed moving to and from a bed to a chair (including a wheelchair)?: Total Help needed standing up from a chair using your arms (e.g., wheelchair or bedside chair)?: Total Help needed to walk in hospital room?: Total Help needed climbing 3-5 steps with a railing? : Total 6 Click Score: 7    End of Session   Activity Tolerance: Patient limited by pain Patient left: in bed;with call bell/phone within reach;with bed alarm set;with nursing/sitter in room;with family/visitor present;with SCD's reapplied (pillows under BLE) Nurse Communication: Mobility status PT Visit Diagnosis: Pain;Other abnormalities of gait and mobility (R26.89);Muscle weakness (generalized) (M62.81) Pain - Right/Left: Left Pain - part of body: Hip    Time: 1610-9604 PT Time Calculation (min) (ACUTE ONLY): 20 min   Charges:   PT Evaluation $PT Eval Moderate Complexity: 1 Mod          Aleda Grana, PT, DPT 04/13/23, 10:54 AM   [place]  Paul Half 04/13/2023, 10:51 AM

## 2023-04-13 NOTE — Assessment & Plan Note (Signed)
Could be secondary to anesthesia and/or pain medications.  Could also be secondary to pain and being in an unfamiliar environment.  The quicker we can switch over to Tylenol the better.

## 2023-04-13 NOTE — Assessment & Plan Note (Signed)
Likely post surgical anemia.  Hemoglobin also diluted down from vigorous IV fluids.  Hemoglobin stabilized after stopping fluids yesterday.  Hemoglobin 8.7.

## 2023-04-13 NOTE — TOC Progression Note (Signed)
Transition of Care [facility]) - Progression Note    Patient Details  Name: Julie [name] MRN: [id number] Date of Birth: 1932-07-04  Transition of Care [facility]) CM/SW Contact  Marlowe Sax, RN Phone Number: 04/13/2023, 3:46 PM  Clinical Narrative:   Spoke to the patient's daughter [name] and explained the process of looking for a bed out of town also that Ins will not cover going all the way to [place], they would be required to py out of pocket for that transport or provide themselves We went thru the facility locations in the hub and I sent out to [facility] in [place] I reviewed with her as well that Ins will only cover if the patient is making progress and showing willingness to participate with Therapy, We are waiting to see what rehab offers come in I will call her in the morning to review    Expected Discharge Plan: Skilled Nursing Facility Barriers to Discharge: SNF Pending bed offer, Insurance Authorization  Expected Discharge Plan and Services                                               Social Determinants of Health (SDOH) Interventions SDOH Screenings   Food Insecurity: No Food Insecurity (04/12/2023)  Housing: Low Risk  (04/12/2023)  Transportation Needs: No Transportation Needs (04/12/2023)  Utilities: Not At Risk (04/12/2023)  Alcohol Screen: Low Risk  (05/29/2022)  Depression (PHQ2-9): Medium Risk (01/19/2023)  Financial Resource Strain: Low Risk  (05/29/2022)  Physical Activity: Insufficiently Active (05/29/2022)  Social Connections: Moderately Integrated (05/29/2022)  Stress: No Stress Concern Present (05/29/2022)  Tobacco Use: Medium Risk (04/13/2023)    Readmission Risk Interventions     No data to display

## 2023-04-13 NOTE — NC FL2 (Signed)
[place] MEDICAID FL2 LEVEL OF CARE FORM     IDENTIFICATION  Patient Name: Julie [name] Birthdate: 1932-08-20 Sex: female Admission Date (Current Location): 04/11/2023  [place] and [place] Number:  Chiropodist and Address:  [facility], [address] [place], [place] [address]      Provider Number:    Attending Physician Name and Address:  Alford Highland, MD  Relative Name and Phone Number:  rochelle [phone]    Current Level of Care: Hospital Recommended Level of Care: Skilled Nursing Facility Prior Approval Number:    Date Approved/Denied:   PASRR Number: [id number]  Discharge Plan: SNF    Current Diagnoses: Patient Active Problem List   Diagnosis Date Noted   Acute delirium 04/13/2023   Drop in hemoglobin 04/13/2023   Hypotension 04/13/2023   Closed hip fracture requiring operative repair, left, sequela 04/12/2023   CKD stage 3a, GFR 45-59 ml/min (HCC) 04/12/2023   Neck pain 04/12/2023   Fall 04/11/2023   Acute cystitis 02/17/2023   Intertrigo 01/19/2023   Gallstones 01/19/2023   Grief reaction 01/19/2023   Overactive bladder 08/29/2022   Anxiety and depression 03/31/2022   Iron deficiency anemia 03/07/2022   Estrogen deficiency 01/22/2022   Rhinorrhea 01/22/2022   Right arm pain 04/17/2021   Headache 04/17/2021   Fatigue 04/17/2021   Urge incontinence 01/22/2021   Pedal edema 01/22/2021   Thoracic back pain 10/23/2020   Mobility impaired 05/14/2020   Spinal stenosis of lumbar region 02/08/2020   Urinary frequency 02/08/2020   Encounter for screening mammogram for breast cancer 08/24/2017   Parkinsonism 08/24/2017   Routine general medical examination at a health care facility 08/08/2016   Left knee pain 02/01/2016   Total knee replacement status 10/15/2015   B12 deficiency 07/16/2015   Neuropathy 07/16/2015   Chorea 07/16/2015   Colon cancer screening 09/22/2014   Osteoarthritis of left lower  extremity 09/09/2014   History of falling 04/26/2014   Poor balance 04/26/2014   Encounter for Medicare annual wellness exam 07/13/2013   Risk for falls 07/13/2013   Weakness of left leg 07/30/2011   BACK PAIN, LUMBAR 12/10/2010   Vitamin D deficiency 08/16/2010   Irritable bowel syndrome 03/26/2010   Osteoporosis 08/27/2009   Hypertriglyceridemia 07/24/2009   Diverticulosis 07/24/2009   OSTEOARTHRITIS 07/24/2009   COLONIC POLYPS, HX OF 07/24/2009   POSTMENOPAUSAL STATUS 07/24/2009    Orientation RESPIRATION BLADDER Height & Weight     Self, Time, Situation, Place  Normal, O2 (2 liters) Continent, External catheter Weight: 80 kg Height:  5\' 2"  (157.5 cm)  BEHAVIORAL SYMPTOMS/MOOD NEUROLOGICAL BOWEL NUTRITION STATUS      Continent Diet (see dc summary)  AMBULATORY STATUS COMMUNICATION OF NEEDS Skin   Extensive Assist Verbally Normal, Surgical wounds                       Personal Care Assistance Level of Assistance  Bathing, Feeding, Dressing Bathing Assistance: Maximum assistance Feeding assistance: Limited assistance Dressing Assistance: Maximum assistance     Functional Limitations Info  Sight, Hearing, Speech Sight Info: Adequate Hearing Info: Adequate Speech Info: Adequate    SPECIAL CARE FACTORS FREQUENCY  PT (By licensed PT), OT (By licensed OT)     PT Frequency: 5 times per week OT Frequency: 5 timesp per week            Contractures Contractures Info: Not present    Additional Factors Info  Code Status, Allergies Code Status Info:  full Allergies Info: codeine           Current Medications (04/13/2023):  This is the current hospital active medication list Current Facility-Administered Medications  Medication Dose Route Frequency Provider Last Rate Last Admin   acetaminophen (TYLENOL) tablet 1,000 mg  1,000 mg Oral Q8H Signa Kell, MD   1,000 mg at 04/13/23 1509   alum & mag hydroxide-simeth (MAALOX/MYLANTA) 200-200-20 MG/5ML suspension 20  mL  20 mL Oral PRN Signa Kell, MD       bisacodyl (DULCOLAX) suppository 10 mg  10 mg Rectal Daily PRN Signa Kell, MD       cholecalciferol (VITAMIN D3) 25 MCG (1000 UNIT) tablet 4,000 Units  4,000 Units Oral Daily Alford Highland, MD   4,000 Units at 04/13/23 1042   cyanocobalamin (VITAMIN B12) tablet 1,000 mcg  1,000 mcg Oral Daily Alford Highland, MD   1,000 mcg at 04/13/23 1042   docusate sodium (COLACE) capsule 100 mg  100 mg Oral BID Signa Kell, MD   100 mg at 04/13/23 1042   enoxaparin (LOVENOX) injection 40 mg  40 mg Subcutaneous Q24H Signa Kell, MD   40 mg at 04/13/23 0900   gabapentin (NEURONTIN) capsule 300 mg  300 mg Oral QHS Alford Highland, MD   300 mg at 04/12/23 2028   iron polysaccharides (NIFEREX) capsule 150 mg  150 mg Oral Daily Signa Kell, MD   150 mg at 04/13/23 1053   methocarbamol (ROBAXIN) tablet 500 mg  500 mg Oral Q6H PRN Signa Kell, MD   500 mg at 04/13/23 1042   Or   methocarbamol (ROBAXIN) 500 mg in dextrose 5 % 50 mL IVPB  500 mg Intravenous Q6H PRN Signa Kell, MD 110 mL/hr at 04/12/23 1549 500 mg at 04/12/23 1549   midodrine (PROAMATINE) tablet 2.5 mg  2.5 mg Oral TID WC Wieting, Richard, MD       morphine (PF) 2 MG/ML injection 0.5 mg  0.5 mg Intravenous Q3H PRN Alford Highland, MD   0.5 mg at 04/13/23 0848   ondansetron (ZOFRAN) tablet 4 mg  4 mg Oral Q6H PRN Signa Kell, MD       Or   ondansetron [facility]) injection 4 mg  4 mg Intravenous Q6H PRN Signa Kell, MD   4 mg at 04/12/23 1308   senna-docusate (Senokot-S) tablet 1 tablet  1 tablet Oral QHS PRN Signa Kell, MD       sodium phosphate (FLEET) 7-19 GM/118ML enema 1 enema  1 enema Rectal Once PRN Signa Kell, MD       traMADol Janean Sark) tablet 50 mg  50 mg Oral Q12H PRN Alford Highland, MD   50 mg at 04/13/23 1512     Discharge Medications: Please see discharge summary for a list of discharge medications.  Relevant Imaging Results:  Relevant Lab Results:   Additional  Information [id number]  Marlowe Sax, RN

## 2023-04-13 NOTE — Plan of Care (Signed)

## 2023-04-13 NOTE — Plan of Care (Signed)

## 2023-04-13 NOTE — Progress Notes (Signed)
  Subjective: 1 Day Post-Op Procedure(s) (LRB): INTRAMEDULLARY (IM) NAIL INTERTROCHANTERIC (Left) Patient reports pain as mild.   Patient is well, and has had no acute complaints or problems Plan is to go Rehab after hospital stay. Negative for chest pain and shortness of breath Fever: no Gastrointestinal: Negative for nausea and vomiting  Objective: Vital signs in last 24 hours: Temp:  [98.2 F (36.8 C)-99.8 F (37.7 C)] 98.2 F (36.8 C) (05/13 0402) Pulse Rate:  [75-82] 81 (05/13 0402) Resp:  [7-18] 16 (05/13 0402) BP: (82-139)/(47-60) 139/52 (05/13 0402) SpO2:  [92 %-100 %] 93 % (05/13 0402)  Intake/Output from previous day:  Intake/Output Summary (Last 24 hours) at 04/13/2023 0644 Last data filed at 04/13/2023 0410 Gross per 24 hour  Intake 924.17 ml  Output 75 ml  Net 849.17 ml    Intake/Output this shift: Total I/O In: 724.2 [I.V.:624.2; IV Piggyback:100] Out: -   Labs: Recent Labs    04/11/23 1120 04/13/23 0443  HGB 12.6 8.9*   Recent Labs    04/11/23 1120 04/13/23 0443  WBC 7.4 8.2  RBC 4.14 2.96*  HCT 37.9 27.0*  PLT 216 174   Recent Labs    04/11/23 1120 04/13/23 0443  NA 138 136  K 3.8 3.7  CL 107 105  CO2 24 23  BUN 14 24*  CREATININE 1.05* 1.26*  GLUCOSE 106* 83  CALCIUM 8.3* 7.1*   No results for input(s): "LABPT", "INR" in the last 72 hours.   EXAM General - Patient is Alert and Oriented Extremity - Neurovascular intact Sensation intact distally Dorsiflexion/Plantar flexion intact Compartment soft Dressing/Incision - clean, dry, no drainage Motor Function - intact, moving foot and toes well on exam.   Past Medical History:  Diagnosis Date   Adenoma    Chronic leg pain    Colon polyp    DDD (degenerative disc disease), cervical    Diverticulosis of colon    internal----Dr. Mechele Collin   Duodenitis    Esophagitis    GERD (gastroesophageal reflux disease)    Hemorrhoids    HLD (hyperlipidemia)    IBS (irritable bowel  syndrome)    with PP diarrhea   Neuropathy    Neuropathy    OA (osteoarthritis) of knee    Osteopenia    PONV (postoperative nausea and vomiting)    Scoliosis    Seborrheic dermatitis    Shortness of breath dyspnea    with activity   TMJ syndrome    Trochanteric bursitis    Unsteady gait    FALLS EASILY    Assessment/Plan: 1 Day Post-Op Procedure(s) (LRB): INTRAMEDULLARY (IM) NAIL INTERTROCHANTERIC (Left) Principal Problem:   Closed hip fracture requiring operative repair, left, sequela Active Problems:   Neuropathy   Parkinsonism   Fall   CKD stage 3a, GFR 45-59 ml/min (HCC)   Neck pain  Estimated body mass index is 32.26 kg/m as calculated from the following:   Height as of this encounter: 5\' 2"  (1.575 m).   Weight as of this encounter: 80 kg. Advance diet Up with therapy  Discharge planning: Plan to follow-up at [facility] orthopedics in 2 weeks.  Staple removal and x-rays of the left hip  DVT Prophylaxis - Lovenox, Foot Pumps, and TED hose Weight-Bearing as tolerated to left leg  Dedra Skeens, PA-C Orthopaedic Surgery 04/13/2023, 6:44 AM

## 2023-04-13 NOTE — Plan of Care (Signed)

## 2023-04-13 NOTE — Progress Notes (Signed)
Upon assessment at 0705 there was no order on the [facility] or in the orders section of EPIC for the Foley catheter.   Tennis Must PA was notified.

## 2023-04-13 NOTE — Assessment & Plan Note (Signed)
Yesterday patient was given fluid bolus secondary to hypotension.  Started on midodrine.

## 2023-04-14 DIAGNOSIS — K5909 Other constipation: Secondary | ICD-10-CM

## 2023-04-14 DIAGNOSIS — S72002A Fracture of unspecified part of neck of left femur, initial encounter for closed fracture: Secondary | ICD-10-CM | POA: Diagnosis not present

## 2023-04-14 DIAGNOSIS — R410 Disorientation, unspecified: Secondary | ICD-10-CM | POA: Diagnosis not present

## 2023-04-14 DIAGNOSIS — K5900 Constipation, unspecified: Secondary | ICD-10-CM | POA: Insufficient documentation

## 2023-04-14 DIAGNOSIS — I9589 Other hypotension: Secondary | ICD-10-CM | POA: Diagnosis not present

## 2023-04-14 DIAGNOSIS — R710 Precipitous drop in hematocrit: Secondary | ICD-10-CM | POA: Diagnosis not present

## 2023-04-14 LAB — BASIC METABOLIC PANEL
Anion gap: 5 (ref 5–15)
BUN: 16 mg/dL (ref 8–23)
CO2: 25 mmol/L (ref 22–32)
Calcium: 7.6 mg/dL — ABNORMAL LOW (ref 8.9–10.3)
Chloride: 108 mmol/L (ref 98–111)
Creatinine, Ser: 1.02 mg/dL — ABNORMAL HIGH (ref 0.44–1.00)
GFR, Estimated: 53 mL/min — ABNORMAL LOW (ref >60)
Glucose, Bld: 95 mg/dL (ref 70–99)
Potassium: 3.4 mmol/L — ABNORMAL LOW (ref 3.5–5.1)
Sodium: 138 mmol/L (ref 135–145)

## 2023-04-14 LAB — CBC
HCT: 27 % — ABNORMAL LOW (ref 36.0–46.0)
Hemoglobin: 8.7 g/dL — ABNORMAL LOW (ref 12.0–15.0)
MCH: 28.4 pg (ref 26.0–34.0)
MCHC: 32.2 g/dL (ref 30.0–36.0)
MCV: 88.2 fL (ref 80.0–100.0)
Platelets: 173 10*3/uL (ref 150–400)
RBC: 3.06 MIL/uL — ABNORMAL LOW (ref 3.87–5.11)
RDW: 14.1 % (ref 11.5–15.5)
WBC: 7.2 10*3/uL (ref 4.0–10.5)
nRBC: 0 % (ref 0.0–0.2)

## 2023-04-14 MED ORDER — POLYETHYLENE GLYCOL 3350 17 G PO PACK
17.0000 g | PACK | Freq: Two times a day (BID) | ORAL | Status: DC
  Administered 2023-04-14 – 2023-04-15 (×2): 17 g via ORAL
  Filled 2023-04-14 (×3): qty 1

## 2023-04-14 NOTE — TOC Progression Note (Addendum)
Transition of Care [facility]) - Progression Note    Patient Details  Name: Julie [name] MRN: [id number] Date of Birth: 03/26/1932  Transition of Care [facility]) CM/SW Contact  Marlowe Sax, RN Phone Number: 04/14/2023, 1:52 PM  Clinical Narrative:    [facility] does not have a bed in [place]  I have reached out to [facility], [facility] and [facility] asking to review for bed awaiting response   Expected Discharge Plan: Skilled Nursing Facility Barriers to Discharge: SNF Pending bed offer, Insurance Authorization  Expected Discharge Plan and Services                                               Social Determinants of Health (SDOH) Interventions SDOH Screenings   Food Insecurity: No Food Insecurity (04/12/2023)  Housing: Low Risk  (04/12/2023)  Transportation Needs: No Transportation Needs (04/12/2023)  Utilities: Not At Risk (04/12/2023)  Alcohol Screen: Low Risk  (05/29/2022)  Depression (PHQ2-9): Medium Risk (01/19/2023)  Financial Resource Strain: Low Risk  (05/29/2022)  Physical Activity: Insufficiently Active (05/29/2022)  Social Connections: Moderately Integrated (05/29/2022)  Stress: No Stress Concern Present (05/29/2022)  Tobacco Use: Medium Risk (04/13/2023)    Readmission Risk Interventions     No data to display

## 2023-04-14 NOTE — Assessment & Plan Note (Signed)
Add MiraLAX 

## 2023-04-14 NOTE — Plan of Care (Signed)

## 2023-04-14 NOTE — Progress Notes (Signed)
Physical Therapy Treatment Patient Details Name: Julie [name] MRN: [id number] DOB: [birth year] Today's Date: 04/14/2023   History of Present Illness Pt is an 89 y/o F admitted on 04/11/23 after presenting with c/o fall & found to have L hip fx. Pt is s/p IM nailing of L femur on 04/12/23. PMH: Parkinsonism, GERD, weakness    PT Comments    Pt seen in pm to assist with transfer training back to bed via stand pivot transfer with MaxA of one and MinA of 1 for safety and guidance to EOB. Pt not accepting much weight through L LE due to pain, and struggles to tolerate any ROM. Pt positioned to comfort in bed with all needs met and in reach. Will continue to progress acutely per POC.   Recommendations for follow up therapy are one component of a multi-disciplinary discharge planning process, led by the attending physician.  Recommendations may be updated based on patient status, additional functional criteria and insurance authorization.  Follow Up Recommendations  Can patient physically be transported by private vehicle: No    Assistance Recommended at Discharge Frequent or constant Supervision/Assistance  Patient can return home with the following Two people to help with walking and/or transfers;Two people to help with bathing/dressing/bathroom;Help with stairs or ramp for entrance;Assist for transportation;Direct supervision/assist for financial management;Assistance with cooking/housework   Equipment Recommendations  None recommended by PT (TBD at next facility)    Recommendations for Other Services       Precautions / Restrictions Precautions Precautions: Fall Restrictions Weight Bearing Restrictions: Yes LLE Weight Bearing: Weight bearing as tolerated     Mobility  Bed Mobility Overal bed mobility: Needs Assistance Bed Mobility: Sit to Supine Rolling: Max assist, +2 for physical assistance   Supine to sit: Max assist, +2 for physical assistance (To reduce increased L hip  pain)     General bed mobility comments:  (Pt very anxious, requiring A of 2 to reassure and decrease increased c/o pain)    Transfers Overall transfer level: Needs assistance Equipment used: None Transfers: Sit to/from Stand Sit to Stand: Max assist Stand pivot transfers: Max assist, Min assist, +2 physical assistance   Squat pivot transfers: Max assist (MinA of additional therapist to assist with trunk guidance during transfer)     General transfer comment:  (MaxA of 1 and MinA of 1 to safely transfer pt and guide pt to desired spot on bed.)    Ambulation/Gait               General Gait Details:  (Will progress as pain is more controlled and pt less fearful)   Stairs             Wheelchair Mobility    Modified Rankin (Stroke Patients Only)       Balance Overall balance assessment: History of Falls, Needs assistance Sitting-balance support: Bilateral upper extremity supported, Feet supported Sitting balance-Leahy Scale: Fair       Standing balance-Leahy Scale: Zero Standing balance comment:  (Squat pivot transfers at this time with MaxA due to pain and fear of falling)                            Cognition Arousal/Alertness: Awake/alert Behavior During Therapy: WFL for tasks assessed/performed Overall Cognitive Status: Within Functional Limits for tasks assessed  General Comments: Pt is AxO to self, situation, location. Pt is very fearful of falling        Exercises Total Joint Exercises Ankle Circles/Pumps: AROM, Both, 20 reps General Exercises - Lower Extremity Hip ABduction/ADduction: PROM, Left, 5 reps    General Comments General comments (skin integrity, edema, etc.):  (Pt educated on benefits of mobility and proper technique for transfers and to promote safety.)      Pertinent Vitals/Pain Pain Assessment Pain Assessment: 0-10 Pain Score: 8  Pain Location: L hip Pain  Descriptors / Indicators: Discomfort, Grimacing, Guarding Pain Intervention(s): Limited activity within patient's tolerance, Ice applied    Home Living                          Prior Function            PT Goals (current goals can now be found in the care plan section) Acute Rehab PT Goals Patient Stated Goal: decreased pain Progress towards PT goals: Progressing toward goals    Frequency    7X/week      PT Plan Current plan remains appropriate    Co-evaluation              AM-PAC PT "6 Clicks" Mobility   Outcome Measure  Help needed turning from your back to your side while in a flat bed without using bedrails?: A Lot Help needed moving from lying on your back to sitting on the side of a flat bed without using bedrails?: Total Help needed moving to and from a bed to a chair (including a wheelchair)?: Total Help needed standing up from a chair using your arms (e.g., wheelchair or bedside chair)?: Total Help needed to walk in hospital room?: Total Help needed climbing 3-5 steps with a railing? : Total 6 Click Score: 7    End of Session Equipment Utilized During Treatment: Gait belt Activity Tolerance: Patient limited by pain Patient left: with call bell/phone within reach;with family/visitor present;in bed;with bed alarm set Nurse Communication: Mobility status PT Visit Diagnosis: Pain;Other abnormalities of gait and mobility (R26.89);Muscle weakness (generalized) (M62.81) Pain - Right/Left: Left Pain - part of body: Hip     Time: 1552-1600 PT Time Calculation (min) (ACUTE ONLY): 8 min  Charges:  $Therapeutic Activity: 8-22 mins                    Zadie Cleverly, PTA  Jannet Askew 04/14/2023, 4:14 PM

## 2023-04-14 NOTE — Progress Notes (Signed)
Physical Therapy Treatment Patient Details Name: Julie [name] MRN: [id number] DOB: 1932-07-30 Today's Date: 04/14/2023   History of Present Illness Pt is an 89 y/o F admitted on 04/11/23 after presenting with c/o fall & found to have L hip fx. Pt is s/p IM nailing of L femur on 04/12/23. PMH: Parkinsonism, GERD, weakness    PT Comments    Pt received in bed after being pre-medicated for session. Awake and alert, family at bedside. Pt educated on role of PT and acute PT goals to increase mobility while here. Pt agreed, stating she had a low tolerance for pain and a high fear of falling. Increased time reassuring pt while assisting with slow movements and rest breaks. Pt was able to tolerate supine to sit with MaxA of 2 to prevent increased pain and further encourage mobility. Sitting EOB ~3 minutes with supervision and relaxation cues. Pt performed two stand/squat pivot transfers with MaxA of 1 and Min of 1, bed to [facility], BSC to chair. Pt unable to side step with L LE, therefore transferring towards Right side each time. Pt positioned to comfort up in recliner with ice pack to L hip. Feel pt will continue to improve with continued PT services as pain and fear decreases.   Recommendations for follow up therapy are one component of a multi-disciplinary discharge planning process, led by the attending physician.  Recommendations may be updated based on patient status, additional functional criteria and insurance authorization.  Follow Up Recommendations  Can patient physically be transported by private vehicle: No    Assistance Recommended at Discharge Frequent or constant Supervision/Assistance  Patient can return home with the following Two people to help with walking and/or transfers;Two people to help with bathing/dressing/bathroom;Help with stairs or ramp for entrance;Assist for transportation;Direct supervision/assist for financial management;Assistance with cooking/housework   Equipment  Recommendations  None recommended by PT (TBD at next facility)    Recommendations for Other Services       Precautions / Restrictions Precautions Precautions: Fall Restrictions Weight Bearing Restrictions: Yes LLE Weight Bearing: Weight bearing as tolerated     Mobility  Bed Mobility Overal bed mobility: Needs Assistance Bed Mobility: Supine to Sit     Supine to sit: Max assist, +2 for physical assistance (To reduce increased L hip pain)          Transfers Overall transfer level: Needs assistance Equipment used: None Transfers: Sit to/from Stand, Bed to chair/wheelchair/BSC Sit to Stand: Max assist     Squat pivot transfers: Max assist (MinA of additional therapist to assist with trunk guidance during transfer)     General transfer comment: Increased tolerance for transfers with rest breaks, continuous reassurance and increased time    Ambulation/Gait               General Gait Details:  (Will progress as pain is more controlled and pt less fearful)   Stairs             Wheelchair Mobility    Modified Rankin (Stroke Patients Only)       Balance Overall balance assessment: History of Falls, Needs assistance Sitting-balance support: Bilateral upper extremity supported, Feet supported Sitting balance-Leahy Scale: Fair       Standing balance-Leahy Scale: Zero Standing balance comment:  (Squat pivot transfers at this time with MaxA due to pain and fear of falling)  Cognition Arousal/Alertness: Awake/alert Behavior During Therapy: WFL for tasks assessed/performed Overall Cognitive Status: Within Functional Limits for tasks assessed                                 General Comments:  (Awake and cooperative throughout session)        Exercises Total Joint Exercises Ankle Circles/Pumps: AROM, Both, 20 reps General Exercises - Lower Extremity Hip ABduction/ADduction: PROM, Left, 5  reps    General Comments General comments (skin integrity, edema, etc.):  (Education provided to pt and family regarding role of PT and current acute PT goals. Explained importance of mobility to pt and reassurance of technique to prevent increased pain)      Pertinent Vitals/Pain Pain Assessment Pain Assessment: 0-10 Pain Score: 10-Worst pain ever Pain Location: L hip Pain Descriptors / Indicators: Discomfort, Grimacing, Guarding Pain Intervention(s): Limited activity within patient's tolerance, Premedicated before session, Ice applied    Home Living                          Prior Function            PT Goals (current goals can now be found in the care plan section) Acute Rehab PT Goals Patient Stated Goal: decreased pain Progress towards PT goals: Progressing toward goals    Frequency    7X/week      PT Plan Current plan remains appropriate    Co-evaluation              AM-PAC PT "6 Clicks" Mobility   Outcome Measure  Help needed turning from your back to your side while in a flat bed without using bedrails?: A Lot Help needed moving from lying on your back to sitting on the side of a flat bed without using bedrails?: Total Help needed moving to and from a bed to a chair (including a wheelchair)?: Total Help needed standing up from a chair using your arms (e.g., wheelchair or bedside chair)?: Total Help needed to walk in hospital room?: Total Help needed climbing 3-5 steps with a railing? : Total 6 Click Score: 7    End of Session Equipment Utilized During Treatment: Gait belt Activity Tolerance: Patient limited by pain Patient left: in chair;with call bell/phone within reach;with chair alarm set;with family/visitor present Nurse Communication: Mobility status PT Visit Diagnosis: Pain;Other abnormalities of gait and mobility (R26.89);Muscle weakness (generalized) (M62.81) Pain - Right/Left: Left Pain - part of body: Hip     Time:  1100-1154 PT Time Calculation (min) (ACUTE ONLY): 54 min  Charges:  $Therapeutic Exercise: 8-22 mins $Neuromuscular Re-education: 38-52 mins                    Zadie Cleverly, PTA  Jannet Askew 04/14/2023, 12:21 PM

## 2023-04-14 NOTE — Progress Notes (Signed)
Occupational Therapy Treatment Patient Details Name: Julie [name] MRN: [id number] DOB: [birth year] Today's Date: 04/14/2023   History of present illness Pt is an 89 y/o F admitted on 04/11/23 after presenting with c/o fall & found to have L hip fx. Pt is s/p IM nailing of L femur on 04/12/23. PMH: Parkinsonism, GERD, weakness   OT comments  Patient received sitting up in recliner and agreeable to OT. Caregiver present at beginning of session. Pt engaged in seated UB dressing and grooming tasks. Pt then requesting to lay back down. Squat pivot transfer completed from recliner>EOB with Max A +1 and Min A +1 to safely guide pt back to bed. Pt demonstrated fear of falling with OOB mobility and overall anxious due to L hip pain. She required Max A +2 to return to supine and Max A +1 to reposition in bed. Pt left with all needs in reach. Pt is making progress toward goal completion. D/C recommendation remains appropriate. OT will continue to follow acutely.    Recommendations for follow up therapy are one component of a multi-disciplinary discharge planning process, led by the attending physician.  Recommendations may be updated based on patient status, additional functional criteria and insurance authorization.    Assistance Recommended at Discharge Frequent or constant Supervision/Assistance  Patient can return home with the following  Two people to help with bathing/dressing/bathroom;Two people to help with walking and/or transfers;Assistance with cooking/housework;Assist for transportation;Help with stairs or ramp for entrance;Direct supervision/assist for financial management;Direct supervision/assist for medications management   Equipment Recommendations  Other (comment) (defer to next venue of care)    Recommendations for Other Services      Precautions / Restrictions Precautions Precautions: Fall Restrictions Weight Bearing Restrictions: Yes LLE Weight Bearing: Weight bearing as  tolerated       Mobility Bed Mobility Overal bed mobility: Needs Assistance Bed Mobility: Sit to Supine       Sit to supine: Max assist, +2 for physical assistance   General bed mobility comments: Max A to reposition pt in bed    Transfers Overall transfer level: Needs assistance Equipment used: None Transfers: Bed to chair/wheelchair/BSC     Squat pivot transfers: Max assist (Min A of additional therapist to assist with trunk guidance during transfer)       General transfer comment: MaxA of 1 and MinA of 1 to safely transfer pt and guide pt to EOB     Balance Overall balance assessment: History of Falls, Needs assistance Sitting-balance support: Bilateral upper extremity supported, Feet supported Sitting balance-Leahy Scale: Fair       Standing balance-Leahy Scale: Zero         ADL either performed or assessed with clinical judgement   ADL Overall ADL's : Needs assistance/impaired     Grooming: Set up;Supervision/safety;Sitting;Wash/dry face           Upper Body Dressing : Minimal assistance;Sitting Upper Body Dressing Details (indicate cue type and reason): to don/doff gown     Toilet Transfer: Squat-pivot;Maximal assistance Toilet Transfer Details (indicate cue type and reason): simulated                Extremity/Trunk Assessment Upper Extremity Assessment Upper Extremity Assessment: Generalized weakness   Lower Extremity Assessment Lower Extremity Assessment: Generalized weakness        Vision Patient Visual Report: No change from baseline     Perception     Praxis      Cognition Arousal/Alertness: Awake/alert Behavior During Therapy: Flat affect, Anxious Overall  Cognitive Status: Within Functional Limits for tasks assessed         General Comments: Pt very fearful of falling        Exercises      Shoulder Instructions       General Comments  (Pt educated on benefits of mobility and proper technique for transfers  and to promote safety.)    Pertinent Vitals/ Pain       Pain Assessment Pain Assessment: 0-10 Pain Score: 8  Pain Location: L hip Pain Descriptors / Indicators: Discomfort, Grimacing, Guarding Pain Intervention(s): Limited activity within patient's tolerance, Monitored during session, Repositioned, Ice applied  Home Living          Prior Functioning/Environment              Frequency  Min 2X/week        Progress Toward Goals  OT Goals(current goals can now be found in the care plan section)  Progress towards OT goals: Progressing toward goals  Acute Rehab OT Goals Patient Stated Goal: to decrease pain OT Goal Formulation: With patient Time For Goal Achievement: 04/27/23 Potential to Achieve Goals: Good  Plan Discharge plan remains appropriate;Frequency remains appropriate    Co-evaluation                 AM-PAC OT "6 Clicks" Daily Activity     Outcome Measure   Help from another person eating meals?: None Help from another person taking care of personal grooming?: A Little Help from another person toileting, which includes using toliet, bedpan, or urinal?: Total Help from another person bathing (including washing, rinsing, drying)?: Total Help from another person to put on and taking off regular upper body clothing?: A Lot Help from another person to put on and taking off regular lower body clothing?: Total 6 Click Score: 12    End of Session Equipment Utilized During Treatment: Gait belt  OT Visit Diagnosis: Unsteadiness on feet (R26.81);Repeated falls (R29.6);Muscle weakness (generalized) (M62.81)   Activity Tolerance Patient limited by pain   Patient Left in bed;with call bell/phone within reach;with bed alarm set   Nurse Communication Mobility status        Time: 1610-9604 OT Time Calculation (min): 18 min  Charges: OT General Charges $OT Visit: 1 Visit OT Treatments $Self Care/Home Management : 8-22 mins  [facility] MS,  OTR/L ascom [phone]  04/14/23, 4:49 PM

## 2023-04-14 NOTE — Progress Notes (Signed)
Progress Note   Patient: Julie [name] ZOX:[id number] DOB: 14-Dec-1931 DOA: 04/11/2023     3 DOS: the patient was seen and examined on 04/14/2023   Brief hospital course: 89 y.o. female with medical history significant of parkinsonism, GERD, weakness presenting with fall and left hip fracture.  Limited history in the setting of parkinsonism and likely baseline dementia.  History primarily from patient's neighbors.  Per report, patient fell on her way to breakfast on her walker.  Patient fell on her left side.  No reported head trauma loss conscious.  Per the neighbors, patient lives at home with a caregiver thick checks on patient throughout the week.  No reports of chest pain, shortness of breath, nausea or vomiting.  No reported dizziness or weakness prior to fall.  Patient with baseline unsteady gait. Presented to the ER afebrile, blood pressure 120s to 180s over 60s to 100s.  Satting well on room air.  White count 7.4, hemoglobin 12.6, platelets 216.  Creatinine 1.05.  CT head and CT C-spine grossly stable.  Chest x-ray stable.  Hip plain films with comminuted left intertrochanteric femoral neck fracture with associated varus angulation.  5/12.  Dr. Allena Katz took for a intramedullary nailing of left femur with cephalomedullary device. 5/13.  Had acute delirium overnight with oxycodone and IV Dilaudid.  Will change to as needed IV morphine low-dose and tramadol.  The quicker we can get over to Tylenol but better. 5/14.  Still having issues with pain medications.  The quicker to convert to Tylenol the better.  Continue low-dose IV morphine prior to PT and low-dose tramadol.  Assessment and Plan: * Closed hip fracture requiring operative repair, left, sequela OR on 5/12 for intramedullary nailing of left femur with cephalomedullary device by Dr. Allena Katz.  With acute delirium with Dilaudid and oxycodone switched pain medications over to a low-dose IV morphine and tramadol to see if she is able to handle  that better.  The quicker we can switch to Tylenol the better.  Acute delirium Could be secondary to anesthesia and/or pain medications.  Could also be secondary to pain and being in an unfamiliar environment.  Patient still having problems with the morphine and tramadol.  Able to talk to me coherently both yesterday and today.  The quicker we can switch over to Tylenol the better.  Hypotension Hypotensive after surgery.  On low-dose midodrine  Drop in hemoglobin Likely post surgical anemia.  Hemoglobin also diluted down from vigorous IV fluids.  Hemoglobin stabilized after stopping fluids yesterday.  Hemoglobin 8.7.  CKD stage 3a, GFR 45-59 ml/min (HCC) Creatinine 1.02 with a GFR 53  Neuropathy On gabapentin  Parkinsonism Appears at baseline with parkinsonism   Neck pain Likely musculoskeletal.  CT cervical spine negative.  Fall Mechanical fall w/ no associated weakness or dizziness   Constipation Add MiraLAX        Subjective: Patient seen this morning still having some issues with pain medications and delirium.  Able to talk to me clearly this morning.  Quicker to convert over to Tylenol the better.  Admitted after hip fracture.  Physical Exam: Vitals:   04/13/23 0807 04/13/23 1200 04/13/23 2331 04/14/23 0838  BP: (!) 122/50 (!) 140/44 101/88 (!) 123/47  Pulse: 80 79 67 77  Resp: 17 17 18 16   Temp: 98.8 F (37.1 C) 98.4 F (36.9 C) 98 F (36.7 C) 98.7 F (37.1 C)  TempSrc:    Oral  SpO2: 94% 93% 97% 95%  Weight:  Height:       Physical Exam HENT:     Head: Normocephalic.  Eyes:     General: Lids are normal.     Conjunctiva/sclera: Conjunctivae normal.  Cardiovascular:     Rate and Rhythm: Normal rate and regular rhythm.     Heart sounds: Normal heart sounds, S1 normal and S2 normal.  Pulmonary:     Breath sounds: No decreased breath sounds, wheezing, rhonchi or rales.  Abdominal:     Palpations: Abdomen is soft.     Tenderness: There is no  abdominal tenderness.  Musculoskeletal:     Right lower leg: No swelling.     Left lower leg: No swelling.  Skin:    General: Skin is warm.     Findings: No rash.  Neurological:     Mental Status: She is alert and oriented to person, place, and time.     Comments: Able to wiggle toes and sensation intact to light touch for left foot.     Data Reviewed: Creatinine 1.02, potassium 3.4, hemoglobin 8.7  Family Communication: Spoke with daughter at the bedside and  Disposition: Status is: Inpatient Remains inpatient appropriate because: Postoperative day 2 for hip fracture repair  Planned Discharge Destination: Rehab    Time spent: 27 minutes  Author: Alford Highland, MD 04/14/2023 4:18 PM  For on call review [email or web address].

## 2023-04-14 NOTE — Progress Notes (Signed)
  Subjective: 2 Days Post-Op Procedure(s) (LRB): INTRAMEDULLARY (IM) NAIL INTERTROCHANTERIC (Left) Patient reports pain as mild.   Patient is well, and has had no acute complaints or problems Plan is to go to Rehab after hospital stay. Negative for chest pain and shortness of breath Fever: no Gastrointestinal: Negative for nausea and vomiting  Objective: Vital signs in last 24 hours: Temp:  [98 F (36.7 C)-98.8 F (37.1 C)] 98 F (36.7 C) (05/13 2331) Pulse Rate:  [67-80] 67 (05/13 2331) Resp:  [17-18] 18 (05/13 2331) BP: (101-140)/(44-88) 101/88 (05/13 2331) SpO2:  [93 %-97 %] 97 % (05/13 2331)  Intake/Output from previous day:  Intake/Output Summary (Last 24 hours) at 04/14/2023 0642 Last data filed at 04/14/2023 0620 Gross per 24 hour  Intake 121.44 ml  Output 1900 ml  Net -1778.56 ml    Intake/Output this shift: Total I/O In: 121.4 [IV Piggyback:121.4] Out: 700 [Urine:700]  Labs: Recent Labs    04/11/23 1120 04/13/23 0443 04/14/23 0612  HGB 12.6 8.9* 8.7*   Recent Labs    04/13/23 0443 04/14/23 0612  WBC 8.2 7.2  RBC 2.96* 3.06*  HCT 27.0* 27.0*  PLT 174 173   Recent Labs    04/11/23 1120 04/13/23 0443  NA 138 136  K 3.8 3.7  CL 107 105  CO2 24 23  BUN 14 24*  CREATININE 1.05* 1.26*  GLUCOSE 106* 83  CALCIUM 8.3* 7.1*   No results for input(s): "LABPT", "INR" in the last 72 hours.   EXAM General - Patient is Alert and Oriented Extremity - Neurovascular intact Sensation intact distally Dorsiflexion/Plantar flexion intact Compartment soft Dressing/Incision - clean, dry, no drainage Motor Function - intact, moving foot and toes well on exam.   Past Medical History:  Diagnosis Date   Adenoma    Chronic leg pain    Colon polyp    DDD (degenerative disc disease), cervical    Diverticulosis of colon    internal----Dr. Mechele Collin   Duodenitis    Esophagitis    GERD (gastroesophageal reflux disease)    Hemorrhoids    HLD (hyperlipidemia)     IBS (irritable bowel syndrome)    with PP diarrhea   Neuropathy    Neuropathy    OA (osteoarthritis) of knee    Osteopenia    PONV (postoperative nausea and vomiting)    Scoliosis    Seborrheic dermatitis    Shortness of breath dyspnea    with activity   TMJ syndrome    Trochanteric bursitis    Unsteady gait    FALLS EASILY    Assessment/Plan: 2 Days Post-Op Procedure(s) (LRB): INTRAMEDULLARY (IM) NAIL INTERTROCHANTERIC (Left) Principal Problem:   Closed hip fracture requiring operative repair, left, sequela Active Problems:   Neuropathy   Parkinsonism   Fall   CKD stage 3a, GFR 45-59 ml/min (HCC)   Neck pain   Acute delirium   Drop in hemoglobin   Hypotension  Estimated body mass index is 32.26 kg/m as calculated from the following:   Height as of this encounter: 5\' 2"  (1.575 m).   Weight as of this encounter: 80 kg. Advance diet Up with therapy  Discharge planning: Plan to follow-up at [facility] clinic orthopedics in 2 weeks.  Staple removal and x-rays of the left hip  DVT Prophylaxis - Lovenox, Foot Pumps, and TED hose Weight-Bearing as tolerated to left leg  Dedra Skeens, PA-C Orthopaedic Surgery 04/14/2023, 6:42 AM

## 2023-04-15 DIAGNOSIS — S72002A Fracture of unspecified part of neck of left femur, initial encounter for closed fracture: Secondary | ICD-10-CM | POA: Diagnosis not present

## 2023-04-15 LAB — BASIC METABOLIC PANEL
Anion gap: 5 (ref 5–15)
BUN: 14 mg/dL (ref 8–23)
CO2: 25 mmol/L (ref 22–32)
Calcium: 7.8 mg/dL — ABNORMAL LOW (ref 8.9–10.3)
Chloride: 108 mmol/L (ref 98–111)
Creatinine, Ser: 0.93 mg/dL (ref 0.44–1.00)
GFR, Estimated: 59 mL/min — ABNORMAL LOW (ref >60)
Glucose, Bld: 88 mg/dL (ref 70–99)
Potassium: 4 mmol/L (ref 3.5–5.1)
Sodium: 138 mmol/L (ref 135–145)

## 2023-04-15 LAB — CBC
HCT: 26.2 % — ABNORMAL LOW (ref 36.0–46.0)
Hemoglobin: 8.8 g/dL — ABNORMAL LOW (ref 12.0–15.0)
MCH: 28.8 pg (ref 26.0–34.0)
MCHC: 33.6 g/dL (ref 30.0–36.0)
MCV: 85.6 fL (ref 80.0–100.0)
Platelets: 215 10*3/uL (ref 150–400)
RBC: 3.06 MIL/uL — ABNORMAL LOW (ref 3.87–5.11)
RDW: 14.3 % (ref 11.5–15.5)
WBC: 6.5 10*3/uL (ref 4.0–10.5)
nRBC: 0 % (ref 0.0–0.2)

## 2023-04-15 MED ORDER — POLYETHYLENE GLYCOL 3350 17 G PO PACK: 17.0000 g | PACK | Freq: Two times a day (BID) | ORAL | 0 refills | Status: AC

## 2023-04-15 MED ORDER — BISACODYL 10 MG RE SUPP
10.0000 mg | Freq: Once | RECTAL | Status: AC
  Administered 2023-04-15: 10 mg via RECTAL
  Filled 2023-04-15: qty 1

## 2023-04-15 MED ORDER — ACETAMINOPHEN 500 MG PO TABS: 1000.0000 mg | ORAL_TABLET | Freq: Four times a day (QID) | ORAL | 0 refills | Status: AC | PRN

## 2023-04-15 MED ORDER — DOCUSATE SODIUM 100 MG PO CAPS: 100.0000 mg | ORAL_CAPSULE | Freq: Two times a day (BID) | ORAL | 0 refills | Status: AC

## 2023-04-15 MED ORDER — ENOXAPARIN SODIUM 40 MG/0.4ML IJ SOSY: 40.0000 mg | PREFILLED_SYRINGE | INTRAMUSCULAR | 0 refills | Status: AC

## 2023-04-15 MED ORDER — TRAMADOL HCL 50 MG PO TABS: 50.0000 mg | ORAL_TABLET | Freq: Two times a day (BID) | ORAL | 0 refills | Status: AC | PRN

## 2023-04-15 NOTE — Plan of Care (Signed)

## 2023-04-15 NOTE — TOC Progression Note (Signed)
Transition of Care [facility]) - Progression Note    Patient Details  Name: Julie [name] MRN: [id number] Date of Birth: 17-Apr-1932  Transition of Care [facility]) CM/SW Contact  Marlowe Sax, RN Phone Number: 04/15/2023, 1:51 PM  Clinical Narrative:   Ins was approved to go to [employer], Berkley Harvey [id number]    Expected Discharge Plan: Skilled Nursing Facility Barriers to Discharge: SNF Pending bed offer, Insurance Authorization  Expected Discharge Plan and Services                                               Social Determinants of Health (SDOH) Interventions SDOH Screenings   Food Insecurity: No Food Insecurity (04/12/2023)  Housing: Low Risk  (04/12/2023)  Transportation Needs: No Transportation Needs (04/12/2023)  Utilities: Not At Risk (04/12/2023)  Alcohol Screen: Low Risk  (05/29/2022)  Depression (PHQ2-9): Medium Risk (01/19/2023)  Financial Resource Strain: Low Risk  (05/29/2022)  Physical Activity: Insufficiently Active (05/29/2022)  Social Connections: Moderately Integrated (05/29/2022)  Stress: No Stress Concern Present (05/29/2022)  Tobacco Use: Medium Risk (04/13/2023)    Readmission Risk Interventions     No data to display

## 2023-04-15 NOTE — Progress Notes (Signed)
PROGRESS NOTE    Julie [name]  UEA:[id number] DOB: August 16, 1932 DOA: 04/11/2023 PCP: Judy Pimple, MD    Brief Narrative:  89 year old with history of Parkinson's, GERD, generalized weakness presented to the ER with fall and left hip fracture.  At baseline, she walks with a walker.  Lives at home with caretaker support in the daytime.  In the emergency room hemodynamically stable.  She was found to have comminuted left intertrochanteric femoral neck fracture with varus angulation.  Underwent surgery, developed delirium in the hospital that is improving now.  Waiting for rehab.   Assessment & Plan:   Closed traumatic left intertrochanter fracture of the femur: Intramedullary nail, Dr. Allena Katz 5/12. Weightbearing as tolerated left lower extremity. DVT prophylaxis with Lovenox, will prescribe on discharge. Pain managed with Tylenol and occasional use of tramadol. Now with constipation, aggressive bowel regimen today.  Hospital-acquired delirium: As anticipated after anesthesia and surgery.  She did good clinical recovery and now stabilized.  Essential hypertension: Postoperative hypotension.  Blood pressures stabilized.  Discontinue midodrine. Anemia of acute blood loss: Anticipated from large bone fracture and surgery.  Hemoglobin is stable. CKD stage III AA: At about baseline. Neuropathy: On gabapentin.  Patient is medically stable.  Transfer to SNF when bed available.   DVT prophylaxis: enoxaparin (LOVENOX) injection 40 mg Start: 04/13/23 0800 SCDs Start: 04/12/23 1135   Code Status: Full code Family Communication: Daughter at the bedside Disposition Plan: Status is: Inpatient Remains inpatient appropriate because: Waiting for SNF availability     Consultants:  Orthopedics  Procedures:  Intramedullary nail left hip  Antimicrobials:  None   Subjective: Patient seen and examined.  Daughter at the bedside.  Pain is controlled now but she was not sure how she will do  on walking.  Patient is alert awake.  Interactive.  She does not really want to go to rehab.  She is worried about not having adequate improvement in 2 days.  No bowel movement yet but denies any abdominal pain or distention. Patient was surprised that she was seeing a GI doctor because of chronic diarrhea and now she has not pooped.  Objective: Vitals:   04/13/23 2331 04/14/23 0838 04/14/23 2348 04/15/23 0917  BP: 101/88 (!) 123/47 (!) 143/56 (!) 157/89  Pulse: 67 77 75 67  Resp: 18 16 20 18   Temp: 98 F (36.7 C) 98.7 F (37.1 C) 97.7 F (36.5 C) 97.8 F (36.6 C)  TempSrc:  Oral    SpO2: 97% 95% 97% 100%  Weight:      Height:        Intake/Output Summary (Last 24 hours) at 04/15/2023 1339 Last data filed at 04/15/2023 0518 Gross per 24 hour  Intake --  Output 1300 ml  Net -1300 ml   Filed Weights   04/11/23 1015  Weight: 80 kg    Examination:  General exam: Appears calm and comfortable  Alert awake and oriented.  No focal neurological deficits. Respiratory system: No added sounds. Cardiovascular system: S1 & S2 heard, RRR. No pedal edema. Gastrointestinal system: Abdomen is nondistended, soft and nontender. No organomegaly or masses felt. Normal bowel sounds heard. Central nervous system: Alert and oriented. No focal neurological deficits. Extremities:  Left lateral thigh incision clean and dry, dressing intact.  Moderate amount of swelling along the lateral thigh area.    Data Reviewed: I have personally reviewed following labs and imaging studies  CBC: Recent Labs  Lab 04/11/23 1120 04/13/23 0443 04/14/23 0612 04/15/23 0858  WBC  7.4 8.2 7.2 6.5  NEUTROABS 5.9  --   --   --   HGB 12.6 8.9* 8.7* 8.8*  HCT 37.9 27.0* 27.0* 26.2*  MCV 91.5 91.2 88.2 85.6  PLT 216 174 173 215   Basic Metabolic Panel: Recent Labs  Lab 04/11/23 1120 04/13/23 0443 04/14/23 0612 04/15/23 0858  NA 138 136 138 138  K 3.8 3.7 3.4* 4.0  CL 107 105 108 108  CO2 24 23 25 25    GLUCOSE 106* 83 95 88  BUN 14 24* 16 14  CREATININE 1.05* 1.26* 1.02* 0.93  CALCIUM 8.3* 7.1* 7.6* 7.8*   GFR: Estimated Creatinine Clearance: 40.2 mL/min (by C-G formula based on SCr of 0.93 mg/dL). Liver Function Tests: No results for input(s): "AST", "ALT", "ALKPHOS", "BILITOT", "PROT", "ALBUMIN" in the last 168 hours. No results for input(s): "LIPASE", "AMYLASE" in the last 168 hours. No results for input(s): "AMMONIA" in the last 168 hours. Coagulation Profile: No results for input(s): "INR", "PROTIME" in the last 168 hours. Cardiac Enzymes: No results for input(s): "CKTOTAL", "CKMB", "CKMBINDEX", "TROPONINI" in the last 168 hours. BNP (last 3 results) No results for input(s): "PROBNP" in the last 8760 hours. HbA1C: No results for input(s): "HGBA1C" in the last 72 hours. CBG: No results for input(s): "GLUCAP" in the last 168 hours. Lipid Profile: No results for input(s): "CHOL", "HDL", "LDLCALC", "TRIG", "CHOLHDL", "LDLDIRECT" in the last 72 hours. Thyroid Function Tests: No results for input(s): "TSH", "T4TOTAL", "FREET4", "T3FREE", "THYROIDAB" in the last 72 hours. Anemia Panel: No results for input(s): "VITAMINB12", "FOLATE", "FERRITIN", "TIBC", "IRON", "RETICCTPCT" in the last 72 hours. Sepsis Labs: No results for input(s): "PROCALCITON", "LATICACIDVEN" in the last 168 hours.  No results found for this or any previous visit (from the past 240 hour(s)).       Radiology Studies: No results found.      Scheduled Meds:  acetaminophen  1,000 mg Oral Q8H   cholecalciferol  4,000 Units Oral Daily   cyanocobalamin  1,000 mcg Oral Daily   docusate sodium  100 mg Oral BID   enoxaparin (LOVENOX) injection  40 mg Subcutaneous Q24H   gabapentin  300 mg Oral QHS   iron polysaccharides  150 mg Oral Daily   polyethylene glycol  17 g Oral BID   Continuous Infusions:  methocarbamol (ROBAXIN) IV Stopped (04/12/23 1728)     LOS: 4 days    Time spent: 35  minutes    Dorcas Carrow, MD [facility] Pager [phone]

## 2023-04-15 NOTE — Progress Notes (Signed)
Physical Therapy Treatment Patient Details Name: Julie [name] MRN: [id number] DOB: [birth year] Today's Date: 04/15/2023   History of Present Illness 89 y/o F admitted 04/11/23 after fall, found to have L hip fx. Pt is s/p IM nailing of L femur on 04/12/23. PMH: Parkinsonism, GERD, weakness    PT Comments    Pt extremely limited with PT session today 2/2 pain. She needed rest, encouragement and coaxing to get each rep of any exercise.  She did show some AROM effort but ultimately needed extremely gentle/guarded AAROM for anything involving movement of the hip and even ankle pumps and quad sets were a struggle for her this date.  PT encouraged and explained typical course of post-surgery care but pt refusing further exercises or mobility after relatively limited effort.  Will continue to encourage activity/mobility as pt is able per post-op protocols.    Recommendations for follow up therapy are one component of a multi-disciplinary discharge planning process, led by the attending physician.  Recommendations may be updated based on patient status, additional functional criteria and insurance authorization.  Follow Up Recommendations  Can patient physically be transported by private vehicle: No    Assistance Recommended at Discharge Frequent or constant Supervision/Assistance  Patient can return home with the following Two people to help with walking and/or transfers;Two people to help with bathing/dressing/bathroom;Help with stairs or ramp for entrance;Assist for transportation;Direct supervision/assist for financial management;Assistance with cooking/housework   Equipment Recommendations   (TBD at rehab)    Recommendations for Other Services       Precautions / Restrictions Precautions Precautions: Fall Restrictions LLE Weight Bearing: Weight bearing as tolerated     Mobility  Bed Mobility               General bed mobility comments: Pt adamantly refusing mobility this date  citing pain and hardly allowing PT to touch her    Transfers                        Ambulation/Gait                   Stairs             Wheelchair Mobility    Modified Rankin (Stroke Patients Only)       Balance                                            Cognition Arousal/Alertness: Awake/alert Behavior During Therapy: Anxious, Restless Overall Cognitive Status: Within Functional Limits for tasks assessed                                          Exercises General Exercises - Lower Extremity Ankle Circles/Pumps: 10 reps, AROM, Strengthening (pt c/o pain with lightly resisted ankle PF motion) Quad Sets: AROM, 10 reps (pt needing rest breaks/encouragement between 2-3 rep sets) Short Arc Quad:  (pt adamantly refusing PT attempts to lift leg to place pillow, screaming out in pain) Heel Slides: PROM (3 reps, excessive explanation of need to move hip and start doing more activity, pt refusing further reps) Hip ABduction/ADduction: PROM, Left, 5 reps (pt calling out in pain with each rep, cuing/rest/encouragement between each rep through minimal range)    General Comments General  comments (skin integrity, edema, etc.): Pt very resistant to PT today 2/2 pain, excessive cuing/encouargement/explanation t/o the session for modicum of exercises      Pertinent Vitals/Pain Pain Assessment Pain Assessment: 0-10 Pain Score: 10-Worst pain ever Faces Pain Scale: Hurts worst Facial Expression: facial grimacing Body Language: rigid, fists clenched, knees up, pushing/pulling away, strikes out Pain Location: L hip (pt displaying significant discomfort at rest and ultimately tolerates very little activity 2/2 c/o severe pain)    Home Living                          Prior Function            PT Goals (current goals can now be found in the care plan section) Progress towards PT goals: Not progressing toward goals -  comment (pt remains extremely pain/self limited)    Frequency    7X/week      PT Plan Current plan remains appropriate    Co-evaluation              AM-PAC PT "6 Clicks" Mobility   Outcome Measure  Help needed turning from your back to your side while in a flat bed without using bedrails?: Total Help needed moving from lying on your back to sitting on the side of a flat bed without using bedrails?: Total Help needed moving to and from a bed to a chair (including a wheelchair)?: Total Help needed standing up from a chair using your arms (e.g., wheelchair or bedside chair)?: Total Help needed to walk in hospital room?: Total Help needed climbing 3-5 steps with a railing? : Total 6 Click Score: 6    End of Session   Activity Tolerance: Patient limited by pain Patient left: with call bell/phone within reach;with family/visitor present;in bed;with bed alarm set Nurse Communication: Mobility status;Patient requests pain meds PT Visit Diagnosis: Pain;Other abnormalities of gait and mobility (R26.89);Muscle weakness (generalized) (M62.81) Pain - Right/Left: Left Pain - part of body: Hip     Time: 1610-9604 PT Time Calculation (min) (ACUTE ONLY): 13 min  Charges:  $Therapeutic Exercise: 8-22 mins                     Malachi Pro, DPT 04/15/2023, 5:05 PM

## 2023-04-15 NOTE — Care Management Important Message (Signed)
Important Message  Patient Details  Name: Julie [name] MRN: [id number] Date of Birth: [birth year]   Medicare Important Message Given:  Yes     Olegario Messier A Vieno Tarrant 04/15/2023, 3:14 PM

## 2023-04-15 NOTE — TOC Progression Note (Signed)
Transition of Care [facility]) - Progression Note    Patient Details  Name: Julie [name] MRN: [id number] Date of Birth: 1932-02-27  Transition of Care [facility]) CM/SW Contact  Marlowe Sax, RN Phone Number: 04/15/2023, 11:15 AM  Clinical Narrative:    Called the patients daughter and she said that she is in the room, I went into the room And reviewed the bed offers with her I called [place] [facility] several times and it would not go thru I explained I needed them to make a choice soon I will need Ins approval and the plan is to DC today, they stated understanding  Expected Discharge Plan: Skilled Nursing Facility Barriers to Discharge: SNF Pending bed offer, Insurance Authorization  Expected Discharge Plan and Services                                               Social Determinants of Health (SDOH) Interventions SDOH Screenings   Food Insecurity: No Food Insecurity (04/12/2023)  Housing: Low Risk  (04/12/2023)  Transportation Needs: No Transportation Needs (04/12/2023)  Utilities: Not At Risk (04/12/2023)  Alcohol Screen: Low Risk  (05/29/2022)  Depression (PHQ2-9): Medium Risk (01/19/2023)  Financial Resource Strain: Low Risk  (05/29/2022)  Physical Activity: Insufficiently Active (05/29/2022)  Social Connections: Moderately Integrated (05/29/2022)  Stress: No Stress Concern Present (05/29/2022)  Tobacco Use: Medium Risk (04/13/2023)    Readmission Risk Interventions     No data to display

## 2023-04-15 NOTE — Discharge Instructions (Signed)
INSTRUCTIONS AFTER Surgery  Remove items at home which could result in a fall. This includes throw rugs or furniture in walking pathways ICE to the affected joint every three hours while awake for 30 minutes at a time, for at least the first 3-5 days, and then as needed for pain and swelling.  Continue to use ice for pain and swelling. You may notice swelling that will progress down to the foot and ankle.  This is normal after surgery.  Elevate your leg when you are not up walking on it.   Continue to use the breathing machine you got in the hospital (incentive spirometer) which will help keep your temperature down.  It is common for your temperature to cycle up and down following surgery, especially at night when you are not up moving around and exerting yourself.  The breathing machine keeps your lungs expanded and your temperature down.   DIET:  As you were doing prior to hospitalization, we recommend a well-balanced diet.  DRESSING / WOUND CARE / SHOWERING  Dressing changes needed.  No showering.  Staples will be removed in 2 weeks at [facility] clinic orthopedics.  ACTIVITY  Increase activity slowly as tolerated, but follow the weight bearing instructions below.   No driving for 6 weeks or until further direction given by your physician.  You cannot drive while taking narcotics.  No lifting or carrying greater than 10 lbs. until further directed by your surgeon. Avoid periods of inactivity such as sitting longer than an hour when not asleep. This helps prevent blood clots.  You may return to work once you are authorized by your doctor.     WEIGHT BEARING  Weightbearing as tolerated on the left   EXERCISES Gait training and ambulation training  CONSTIPATION  Constipation is defined medically as fewer than three stools per week and severe constipation as less than one stool per week.  Even if you have a regular bowel pattern at home, your normal regimen is likely to be disrupted due  to multiple reasons following surgery.  Combination of anesthesia, postoperative narcotics, change in appetite and fluid intake all can affect your bowels.   YOU MUST use at least one of the following options; they are listed in order of increasing strength to get the job done.  They are all available over the counter, and you may need to use some, POSSIBLY even all of these options:    Drink plenty of fluids (prune juice may be helpful) and high fiber foods Colace 100 mg by mouth twice a day  Senokot for constipation as directed and as needed Dulcolax (bisacodyl), take with full glass of water  Miralax (polyethylene glycol) once or twice a day as needed.  If you have tried all these things and are unable to have a bowel movement in the first 3-4 days after surgery call either your surgeon or your primary doctor.    If you experience loose stools or diarrhea, hold the medications until you stool forms back up.  If your symptoms do not get better within 1 week or if they get worse, check with your doctor.  If you experience "the worst abdominal pain ever" or develop nausea or vomiting, please contact the office immediately for further recommendations for treatment.   ITCHING:  If you experience itching with your medications, try taking only a single pain pill, or even half a pain pill at a time.  You can also use Benadryl over the counter for itching or  also to help with sleep.   TED HOSE STOCKINGS:  Use stockings on both legs until for at least 2 weeks or as directed by physician office. They may be removed at night for sleeping.  MEDICATIONS:  See your medication summary on the "After Visit Summary" that nursing will review with you.  You may have some home medications which will be placed on hold until you complete the course of blood thinner medication.  It is important for you to complete the blood thinner medication as prescribed.  PRECAUTIONS:  If you experience chest pain or shortness of  breath - call 911 immediately for transfer to the hospital emergency department.   If you develop a fever greater that 101 F, purulent drainage from wound, increased redness or drainage from wound, foul odor from the wound/dressing, or calf pain - CONTACT YOUR SURGEON.                                                   FOLLOW-UP APPOINTMENTS:  If you do not already have a post-op appointment, please call the office for an appointment to be seen by your surgeon.  Guidelines for how soon to be seen are listed in your "After Visit Summary", but are typically between 1-4 weeks after surgery.  OTHER INSTRUCTIONS:     MAKE SURE YOU:  Understand these instructions.  Get help right away if you are not doing well or get worse.    Thank you for letting us be a part of your medical care team.  It is a privilege we respect greatly.  We hope these instructions will help you stay on track for a fast and full recovery!

## 2023-04-15 NOTE — TOC Progression Note (Signed)
Transition of Care [facility]) - Progression Note    Patient Details  Name: Julie [name] MRN: [id number] Date of Birth: 1932-03-18  [facility] CM/SW Contact  Marlowe Sax, RN Phone Number: 04/15/2023, 1:10 PM  Clinical Narrative:   Alvis Lemmings the patient's daughter called and accepted the bed offer from [facility] Ins auth pending, Patietn needs to have a BM prior to DC and Ins needs to be approved    Expected Discharge Plan: Skilled Nursing Facility Barriers to Discharge: SNF Pending bed offer, Insurance Authorization  Expected Discharge Plan and Services                                               Social Determinants of Health (SDOH) Interventions SDOH Screenings   Food Insecurity: No Food Insecurity (04/12/2023)  Housing: Low Risk  (04/12/2023)  Transportation Needs: No Transportation Needs (04/12/2023)  Utilities: Not At Risk (04/12/2023)  Alcohol Screen: Low Risk  (05/29/2022)  Depression (PHQ2-9): Medium Risk (01/19/2023)  Financial Resource Strain: Low Risk  (05/29/2022)  Physical Activity: Insufficiently Active (05/29/2022)  Social Connections: Moderately Integrated (05/29/2022)  Stress: No Stress Concern Present (05/29/2022)  Tobacco Use: Medium Risk (04/13/2023)    Readmission Risk Interventions     No data to display

## 2023-04-16 DIAGNOSIS — G20C Parkinsonism, unspecified: Secondary | ICD-10-CM | POA: Diagnosis not present

## 2023-04-16 DIAGNOSIS — I10 Essential (primary) hypertension: Secondary | ICD-10-CM | POA: Diagnosis not present

## 2023-04-16 DIAGNOSIS — M48061 Spinal stenosis, lumbar region without neurogenic claudication: Secondary | ICD-10-CM | POA: Diagnosis not present

## 2023-04-16 DIAGNOSIS — I129 Hypertensive chronic kidney disease with stage 1 through stage 4 chronic kidney disease, or unspecified chronic kidney disease: Secondary | ICD-10-CM | POA: Diagnosis not present

## 2023-04-16 DIAGNOSIS — W19XXXD Unspecified fall, subsequent encounter: Secondary | ICD-10-CM | POA: Diagnosis not present

## 2023-04-16 DIAGNOSIS — S4992XA Unspecified injury of left shoulder and upper arm, initial encounter: Secondary | ICD-10-CM | POA: Diagnosis not present

## 2023-04-16 DIAGNOSIS — M1990 Unspecified osteoarthritis, unspecified site: Secondary | ICD-10-CM | POA: Diagnosis not present

## 2023-04-16 DIAGNOSIS — G20A1 Parkinson's disease without dyskinesia, without mention of fluctuations: Secondary | ICD-10-CM | POA: Diagnosis not present

## 2023-04-16 DIAGNOSIS — K5900 Constipation, unspecified: Secondary | ICD-10-CM | POA: Diagnosis not present

## 2023-04-16 DIAGNOSIS — D62 Acute posthemorrhagic anemia: Secondary | ICD-10-CM | POA: Diagnosis not present

## 2023-04-16 DIAGNOSIS — Z7401 Bed confinement status: Secondary | ICD-10-CM | POA: Diagnosis not present

## 2023-04-16 DIAGNOSIS — S72142D Displaced intertrochanteric fracture of left femur, subsequent encounter for closed fracture with routine healing: Secondary | ICD-10-CM | POA: Diagnosis not present

## 2023-04-16 DIAGNOSIS — M6281 Muscle weakness (generalized): Secondary | ICD-10-CM | POA: Diagnosis not present

## 2023-04-16 DIAGNOSIS — D649 Anemia, unspecified: Secondary | ICD-10-CM | POA: Diagnosis not present

## 2023-04-16 DIAGNOSIS — Z7901 Long term (current) use of anticoagulants: Secondary | ICD-10-CM | POA: Diagnosis not present

## 2023-04-16 DIAGNOSIS — M25512 Pain in left shoulder: Secondary | ICD-10-CM | POA: Diagnosis not present

## 2023-04-16 DIAGNOSIS — N1831 Chronic kidney disease, stage 3a: Secondary | ICD-10-CM | POA: Diagnosis not present

## 2023-04-16 DIAGNOSIS — K219 Gastro-esophageal reflux disease without esophagitis: Secondary | ICD-10-CM | POA: Diagnosis not present

## 2023-04-16 DIAGNOSIS — G629 Polyneuropathy, unspecified: Secondary | ICD-10-CM | POA: Diagnosis not present

## 2023-04-16 DIAGNOSIS — M25552 Pain in left hip: Secondary | ICD-10-CM | POA: Diagnosis not present

## 2023-04-16 DIAGNOSIS — S72002A Fracture of unspecified part of neck of left femur, initial encounter for closed fracture: Secondary | ICD-10-CM | POA: Diagnosis not present

## 2023-04-16 DIAGNOSIS — I959 Hypotension, unspecified: Secondary | ICD-10-CM | POA: Diagnosis not present

## 2023-04-16 DIAGNOSIS — R4182 Altered mental status, unspecified: Secondary | ICD-10-CM | POA: Diagnosis not present

## 2023-04-16 DIAGNOSIS — F418 Other specified anxiety disorders: Secondary | ICD-10-CM | POA: Diagnosis not present

## 2023-04-16 DIAGNOSIS — M79605 Pain in left leg: Secondary | ICD-10-CM | POA: Diagnosis not present

## 2023-04-16 DIAGNOSIS — F03A18 Unspecified dementia, mild, with other behavioral disturbance: Secondary | ICD-10-CM | POA: Diagnosis not present

## 2023-04-16 NOTE — TOC Progression Note (Addendum)
Transition of Care [facility]) - Progression Note    Patient Details  Name: Julie [name] MRN: [id number] Date of Birth: [birth year]  Transition of Care [facility]) CM/SW Contact  Marlowe Sax, RN Phone Number: 04/16/2023, 10:53 AM  Clinical Narrative:   left a VM for daughter [name] DC planned today to [facility] Called EMS to transport to [facility] she is 2nd on list Expected Discharge Plan: Skilled Nursing Facility Barriers to Discharge: SNF Pending bed offer, Insurance Authorization  Expected Discharge Plan and Services         Expected Discharge Date: 04/16/23                                     Social Determinants of Health (SDOH) Interventions SDOH Screenings   Food Insecurity: No Food Insecurity (04/12/2023)  Housing: Low Risk  (04/12/2023)  Transportation Needs: No Transportation Needs (04/12/2023)  Utilities: Not At Risk (04/12/2023)  Alcohol Screen: Low Risk  (05/29/2022)  Depression (PHQ2-9): Medium Risk (01/19/2023)  Financial Resource Strain: Low Risk  (05/29/2022)  Physical Activity: Insufficiently Active (05/29/2022)  Social Connections: Moderately Integrated (05/29/2022)  Stress: No Stress Concern Present (05/29/2022)  Tobacco Use: Medium Risk (04/13/2023)    Readmission Risk Interventions     No data to display

## 2023-04-16 NOTE — Progress Notes (Signed)
Patient left [facility] unit 1 A. She is alert and oriented x 4, conversing with transport team members. Her home health aide is following and her daughter Boykin Reaper will also go to [facility] with her.  Discharge VS were taken and all are WNL.

## 2023-04-16 NOTE — Progress Notes (Signed)
Report called to [facility] the nurse who will be receiving care of Ms. Rathod at [facility]. All questions answered and she verbalized understanding all discharge instructions.

## 2023-04-16 NOTE — Plan of Care (Signed)
  Problem: Education: Goal: Knowledge of General Education information will improve Description: Including pain rating scale, medication(s)/side effects and non-pharmacologic comfort measures 04/16/2023 1256 by Alver Fisher, RN Outcome: Completed/Met 04/16/2023 1032 by Alver Fisher, RN Outcome: Progressing   Problem: Health Behavior/Discharge Planning: Goal: Ability to manage health-related needs will improve 04/16/2023 1256 by Alver Fisher, RN Outcome: Completed/Met 04/16/2023 1032 by Alver Fisher, RN Outcome: Progressing   Problem: Clinical Measurements: Goal: Ability to maintain clinical measurements within normal limits will improve 04/16/2023 1256 by Alver Fisher, RN Outcome: Completed/Met 04/16/2023 1032 by Alver Fisher, RN Outcome: Progressing Goal: Will remain free from infection 04/16/2023 1256 by Alver Fisher, RN Outcome: Completed/Met 04/16/2023 1032 by Alver Fisher, RN Outcome: Progressing Goal: Diagnostic test results will improve 04/16/2023 1256 by Alver Fisher, RN Outcome: Completed/Met 04/16/2023 1032 by Tereasa Coop D, RN Outcome: Progressing Goal: Respiratory complications will improve 04/16/2023 1256 by Alver Fisher, RN Outcome: Completed/Met 04/16/2023 1032 by Alver Fisher, RN Outcome: Progressing Goal: Cardiovascular complication will be avoided 04/16/2023 1256 by Alver Fisher, RN Outcome: Completed/Met 04/16/2023 1032 by Alver Fisher, RN Outcome: Progressing   Problem: Activity: Goal: Risk for activity intolerance will decrease 04/16/2023 1256 by Alver Fisher, RN Outcome: Completed/Met 04/16/2023 1032 by Alver Fisher, RN Outcome: Progressing   Problem: Nutrition: Goal: Adequate nutrition will be maintained 04/16/2023 1256 by Alver Fisher, RN Outcome: Completed/Met 04/16/2023 1032 by Tereasa Coop D, RN Outcome: Progressing   Problem:  Coping: Goal: Level of anxiety will decrease 04/16/2023 1256 by Alver Fisher, RN Outcome: Completed/Met 04/16/2023 1032 by Alver Fisher, RN Outcome: Progressing   Problem: Elimination: Goal: Will not experience complications related to bowel motility 04/16/2023 1256 by Alver Fisher, RN Outcome: Completed/Met 04/16/2023 1032 by Alver Fisher, RN Outcome: Progressing Goal: Will not experience complications related to urinary retention 04/16/2023 1256 by Alver Fisher, RN Outcome: Completed/Met 04/16/2023 1032 by Alver Fisher, RN Outcome: Progressing   Problem: Pain Managment: Goal: General experience of comfort will improve 04/16/2023 1256 by Alver Fisher, RN Outcome: Completed/Met 04/16/2023 1032 by Alver Fisher, RN Outcome: Progressing   Problem: Safety: Goal: Ability to remain free from injury will improve 04/16/2023 1256 by Alver Fisher, RN Outcome: Completed/Met 04/16/2023 1032 by Alver Fisher, RN Outcome: Progressing   Problem: Skin Integrity: Goal: Risk for impaired skin integrity will decrease 04/16/2023 1256 by Alver Fisher, RN Outcome: Completed/Met 04/16/2023 1032 by Alver Fisher, RN Outcome: Progressing

## 2023-04-16 NOTE — Discharge Summary (Signed)
Physician Discharge Summary  Julie [name]:[id number] DOB: [birth year] DOA: 04/11/2023  PCP: Judy Pimple, MD  Admit date: 04/11/2023 Discharge date: 04/16/2023  Admitted From: Home Disposition: Skilled nursing facility  Recommendations for Outpatient Follow-up:  Follow up with PCP in 1-2 weeks Please obtain BMP/CBC in one week Orthopedic to schedule follow-up for staple removal and recheck  Discharge Condition: Fair CODE STATUS: Full code Diet recommendation: Low-salt diet  Discharge summary: 89 year old with history of Parkinson's, GERD, generalized weakness presented to the ER with fall and left hip fracture.  At baseline, she walks with a walker.  Lives at home with caretaker support in the daytime.  In the emergency room hemodynamically stable.  She was found to have comminuted left intertrochanteric femoral neck fracture with varus angulation.  Underwent surgery, developed delirium in the hospital that is improving now.       Assessment & plan of care:  Closed traumatic left intertrochanter fracture of the femur: Intramedullary nail, Dr. Allena Katz 5/12. Weightbearing as tolerated left lower extremity. DVT prophylaxis with Lovenox, will prescribe on discharge. Pain managed with Tylenol and occasional use of tramadol. Diarrhea at baseline.  Now on bowel regimen.  Avoid constipation.   Hospital-acquired delirium: As anticipated after anesthesia and surgery.  She did good clinical recovery and now stabilized.   Essential hypertension: Postoperative hypotension.  Blood pressures stabilized.  Discontinue midodrine.  Anemia of acute blood loss: Anticipated from large bone fracture and surgery.  Hemoglobin is stable.  CKD stage III AA: At about baseline.  Neuropathy: On gabapentin.   Patient is medically stable.  Transfer to SNF when bed available.  Discharge Diagnoses:  Principal Problem:   Closed hip fracture requiring operative repair, left, sequela Active Problems:    Acute delirium   Hypotension   Drop in hemoglobin   CKD stage 3a, GFR 45-59 ml/min (HCC)   Neuropathy   Parkinsonism   Neck pain   Fall   Constipation    Discharge Instructions  Discharge Instructions     Diet - low sodium heart healthy   Complete by: As directed    Increase activity slowly   Complete by: As directed       Allergies as of 04/16/2023       Reactions   Codeine    REACTION: headache and nausea and vomiting        Medication List     TAKE these medications    acetaminophen 500 MG tablet Commonly known as: TYLENOL Take 2 tablets (1,000 mg total) by mouth every 6 (six) hours as needed for mild pain or moderate pain. What changed:  how much to take reasons to take this   alendronate 70 MG tablet Commonly known as: FOSAMAX Take 1 tablet (70 mg total) by mouth every 7 (seven) days. Take with a full glass of water on an empty stomach.   Alum Hydroxide-Mag Carbonate 160-105 MG Chew Chew 1 tablet by mouth as needed (acid reflux).   cyanocobalamin 1000 MCG tablet Commonly known as: VITAMIN B12 Take 1,000 mcg by mouth daily.   docusate sodium 100 MG capsule Commonly known as: COLACE Take 1 capsule (100 mg total) by mouth 2 (two) times daily.   enoxaparin 40 MG/0.4ML injection Commonly known as: LOVENOX Inject 0.4 mLs (40 mg total) into the skin daily for 14 days.   Ferrex 150 150 MG capsule Generic drug: iron polysaccharides TAKE 1 CAPSULE BY MOUTH ONCE DAILY   gabapentin 300 MG capsule Commonly known as: NEURONTIN Take  1 capsule (300 mg total) by mouth at bedtime.   nystatin powder Commonly known as: MYCOSTATIN/NYSTOP Apply 1 Application topically 2 (two) times daily as needed. To affected area under breasts and abdomen   polyethylene glycol 17 g packet Commonly known as: MIRALAX / GLYCOLAX Take 17 g by mouth 2 (two) times daily.   SM Vitamin D3 100 MCG (4000 UT) Caps Generic drug: Cholecalciferol Take 4,000 Units by mouth daily.    traMADol 50 MG tablet Commonly known as: ULTRAM Take 1 tablet (50 mg total) by mouth every 12 (twelve) hours as needed for moderate pain.        Follow-up Information     Dedra Skeens, [place]. Schedule an appointment as soon as possible for a visit in 2 week(s).   Specialty: Orthopedic Surgery Why: Staple removal and x-rays of the left hip Contact information: [address] [place] [address] [phone]                Allergies  Allergen Reactions   Codeine     REACTION: headache and nausea and vomiting    Consultations: Orthopedics   Procedures/Studies: DG HIP UNILAT WITH PELVIS 2-3 VIEWS LEFT  Result Date: 04/12/2023 CLINICAL DATA:  Left hip fracture repair. EXAM: DG HIP (WITH OR WITHOUT PELVIS) 2-3V LEFT COMPARISON:  None Available. FINDINGS: Multiple fluoroscopic images were obtained during left hip fracture repair. A gamma nail and femoral rod have been placed across the fracture with a distal interlocking screw by the end of the study. IMPRESSION: Left hip fracture repair as above. Electronically Signed   By: Gerome Sam III M.D.   On: 04/12/2023 09:54   DG C-Arm 1-60 Min-No Report  Result Date: 04/12/2023 Fluoroscopy was utilized by the requesting physician.  No radiographic interpretation.   CT Head Wo Contrast  Result Date: 04/11/2023 CLINICAL DATA:  Neck trauma EXAM: CT HEAD WITHOUT CONTRAST CT CERVICAL SPINE WITHOUT CONTRAST TECHNIQUE: Multidetector CT imaging of the head and cervical spine was performed following the standard protocol without intravenous contrast. Multiplanar CT image reconstructions of the cervical spine were also generated. RADIATION DOSE REDUCTION: This exam was performed according to the departmental dose-optimization program which includes automated exposure control, adjustment of the mA and/or kV according to patient size and/or use of iterative reconstruction technique. COMPARISON:  10/09/2020  FINDINGS: CT HEAD FINDINGS Brain: No evidence of acute infarction, hemorrhage, hydrocephalus, extra-axial collection or mass lesion/mass effect. Extensive chronic small vessel ischemia in the cerebral white matter. Small cerebellar infarcts. Vascular: Aneurysm clipping along the right side of the circle-of-Willis. Intracranial arterial tortuosity. Skull: Unremarkable right-sided craniotomy. Sinuses/Orbits: Bilateral cataract resection.  No evidence of injury CT CERVICAL SPINE FINDINGS Alignment: Normal. Skull base and vertebrae: No acute fracture. No primary bone lesion or focal pathologic process. Soft tissues and spinal canal: No prevertebral fluid or swelling. No visible canal hematoma. Disc levels:  Generalized degenerative endplate and facet spurring. Upper chest: Negative. IMPRESSION: No evidence of acute intracranial or cervical spine injury. Electronically Signed   By: Tiburcio Pea M.D.   On: 04/11/2023 11:09   CT Cervical Spine Wo Contrast  Result Date: 04/11/2023 CLINICAL DATA:  Neck trauma EXAM: CT HEAD WITHOUT CONTRAST CT CERVICAL SPINE WITHOUT CONTRAST TECHNIQUE: Multidetector CT imaging of the head and cervical spine was performed following the standard protocol without intravenous contrast. Multiplanar CT image reconstructions of the cervical spine were also generated. RADIATION DOSE REDUCTION: This exam was performed according to the departmental dose-optimization program which  includes automated exposure control, adjustment of the mA and/or kV according to patient size and/or use of iterative reconstruction technique. COMPARISON:  10/09/2020 FINDINGS: CT HEAD FINDINGS Brain: No evidence of acute infarction, hemorrhage, hydrocephalus, extra-axial collection or mass lesion/mass effect. Extensive chronic small vessel ischemia in the cerebral white matter. Small cerebellar infarcts. Vascular: Aneurysm clipping along the right side of the circle-of-Willis. Intracranial arterial tortuosity. Skull:  Unremarkable right-sided craniotomy. Sinuses/Orbits: Bilateral cataract resection.  No evidence of injury CT CERVICAL SPINE FINDINGS Alignment: Normal. Skull base and vertebrae: No acute fracture. No primary bone lesion or focal pathologic process. Soft tissues and spinal canal: No prevertebral fluid or swelling. No visible canal hematoma. Disc levels:  Generalized degenerative endplate and facet spurring. Upper chest: Negative. IMPRESSION: No evidence of acute intracranial or cervical spine injury. Electronically Signed   By: Tiburcio Pea M.D.   On: 04/11/2023 11:09   DG Chest 1 View  Result Date: 04/11/2023 CLINICAL DATA:  Fall, hip fracture EXAM: CHEST  1 VIEW COMPARISON:  None Available. FINDINGS: Lungs are clear. No pneumothorax or pleural effusion. Cardiac size is at the upper limits of normal. Pulmonary vascularity is normal. No acute bone abnormality. IMPRESSION: 1. No active disease. Electronically Signed   By: Helyn Numbers M.D.   On: 04/11/2023 11:07   DG Knee 2 Views Left  Result Date: 04/11/2023 CLINICAL DATA:  Unwitnessed fall. EXAM: LEFT KNEE - 1-2 VIEW COMPARISON:  None Available. FINDINGS: Two view study. Study limited by positioning. No evidence for an acute periprosthetic fracture on this limited study. The lateral cross-table film is rotated, but no gross joint effusion evident. IMPRESSION: Negative. Electronically Signed   By: Kennith Center M.D.   On: 04/11/2023 10:50   DG Hip Unilat W or Wo Pelvis 2-3 Views Left  Result Date: 04/11/2023 CLINICAL DATA:  Unwitnessed fall. EXAM: DG HIP (WITH OR WITHOUT PELVIS) 2-3V LEFT COMPARISON:  None Available. FINDINGS: Bones are diffusely demineralized. Comminuted left intertrochanteric femoral neck fracture with associated varus angulation. No evidence for hip dislocation. SI joints unremarkable. Degenerative changes noted at the symphysis pubis. IMPRESSION: Comminuted left intertrochanteric femoral neck fracture with associated varus  angulation. Electronically Signed   By: Kennith Center M.D.   On: 04/11/2023 10:49   (Echo, Carotid, EGD, Colonoscopy, ERCP)    Subjective: Patient seen and examined.  No overnight events.  She was very apprehensive about any movement and nursing care.  Pain remains major issue but delirium has improved.  She had bowel movement yesterday after the Dulcolax.   Discharge Exam: Vitals:   04/15/23 2353 04/16/23 0757  BP: 134/70 (!) 127/41  Pulse: 75 74  Resp: 20 18  Temp: 98 F (36.7 C) 98.2 F (36.8 C)  SpO2: 98% 97%   Vitals:   04/15/23 0917 04/15/23 1557 04/15/23 2353 04/16/23 0757  BP: (!) 157/89 (!) 136/49 134/70 (!) 127/41  Pulse: 67 69 75 74  Resp: 18 18 20 18   Temp: 97.8 F (36.6 C) 98.1 F (36.7 C) 98 F (36.7 C) 98.2 F (36.8 C)  TempSrc:      SpO2: 100% 95% 98% 97%  Weight:      Height:        General: Pt is alert, awake,  Anxious and in mild distress on bed mobility. Cardiovascular: RRR, S1/S2 +, no rubs, no gallops Respiratory: CTA bilaterally, no wheezing, no rhonchi Abdominal: Soft, NT, ND, bowel sounds + Extremities:  Left lateral thigh incision clean and dry, mild edema surrounding incision lines,  no redness or erythema.    The results of significant diagnostics from this hospitalization (including imaging, microbiology, ancillary and laboratory) are listed below for reference.     Microbiology: No results found for this or any previous visit (from the past 240 hour(s)).   Labs: BNP (last 3 results) No results for input(s): "BNP" in the last 8760 hours. Basic Metabolic Panel: Recent Labs  Lab 04/11/23 1120 04/13/23 0443 04/14/23 0612 04/15/23 0858  NA 138 136 138 138  K 3.8 3.7 3.4* 4.0  CL 107 105 108 108  CO2 24 23 25 25   GLUCOSE 106* 83 95 88  BUN 14 24* 16 14  CREATININE 1.05* 1.26* 1.02* 0.93  CALCIUM 8.3* 7.1* 7.6* 7.8*   Liver Function Tests: No results for input(s): "AST", "ALT", "ALKPHOS", "BILITOT", "PROT", "ALBUMIN" in the  last 168 hours. No results for input(s): "LIPASE", "AMYLASE" in the last 168 hours. No results for input(s): "AMMONIA" in the last 168 hours. CBC: Recent Labs  Lab 04/11/23 1120 04/13/23 0443 04/14/23 0612 04/15/23 0858  WBC 7.4 8.2 7.2 6.5  NEUTROABS 5.9  --   --   --   HGB 12.6 8.9* 8.7* 8.8*  HCT 37.9 27.0* 27.0* 26.2*  MCV 91.5 91.2 88.2 85.6  PLT 216 174 173 215   Cardiac Enzymes: No results for input(s): "CKTOTAL", "CKMB", "CKMBINDEX", "TROPONINI" in the last 168 hours. BNP: Invalid input(s): "POCBNP" CBG: No results for input(s): "GLUCAP" in the last 168 hours. D-Dimer No results for input(s): "DDIMER" in the last 72 hours. Hgb A1c No results for input(s): "HGBA1C" in the last 72 hours. Lipid Profile No results for input(s): "CHOL", "HDL", "LDLCALC", "TRIG", "CHOLHDL", "LDLDIRECT" in the last 72 hours. Thyroid function studies No results for input(s): "TSH", "T4TOTAL", "T3FREE", "THYROIDAB" in the last 72 hours.  Invalid input(s): "FREET3" Anemia work up No results for input(s): "VITAMINB12", "FOLATE", "FERRITIN", "TIBC", "IRON", "RETICCTPCT" in the last 72 hours. Urinalysis    Component Value Date/Time   COLORURINE YELLOW (A) 04/13/2023 1542   APPEARANCEUR CLEAR (A) 04/13/2023 1542   LABSPEC <1.005 (L) 04/13/2023 1542   PHURINE 6.0 04/13/2023 1542   GLUCOSEU NEGATIVE 04/13/2023 1542   HGBUR SMALL (A) 04/13/2023 1542   BILIRUBINUR NEGATIVE 04/13/2023 1542   BILIRUBINUR 2 mg/dL 60/45/4098 1191   KETONESUR NEGATIVE 04/13/2023 1542   PROTEINUR NEGATIVE 04/13/2023 1542   UROBILINOGEN 1.0 02/17/2023 1533   NITRITE NEGATIVE 04/13/2023 1542   LEUKOCYTESUR TRACE (A) 04/13/2023 1542   Sepsis Labs Recent Labs  Lab 04/11/23 1120 04/13/23 0443 04/14/23 0612 04/15/23 0858  WBC 7.4 8.2 7.2 6.5   Microbiology No results found for this or any previous visit (from the past 240 hour(s)).   Time coordinating discharge: 35 minutes  SIGNED:   Dorcas Carrow,  MD  [facility] 04/16/2023, 9:54 AM

## 2023-04-16 NOTE — Plan of Care (Signed)

## 2023-04-16 NOTE — Progress Notes (Signed)
  Subjective: 4 Days Post-Op Procedure(s) (LRB): INTRAMEDULLARY (IM) NAIL INTERTROCHANTERIC (Left) Patient reports pain as mild to moderate. Patient is well, and has had no acute complaints or problems Plan is to go to Rehab after hospital stay. Negative for chest pain and shortness of breath Fever: no Gastrointestinal: Negative for nausea and vomiting  Objective: Vital signs in last 24 hours: Temp:  [97.8 F (36.6 C)-98.1 F (36.7 C)] 98 F (36.7 C) (05/15 2353) Pulse Rate:  [67-75] 75 (05/15 2353) Resp:  [18-20] 20 (05/15 2353) BP: (134-157)/(49-89) 134/70 (05/15 2353) SpO2:  [95 %-100 %] 98 % (05/15 2353)  Intake/Output from previous day:  Intake/Output Summary (Last 24 hours) at 04/16/2023 0648 Last data filed at 04/16/2023 0459 Gross per 24 hour  Intake 240 ml  Output 1200 ml  Net -960 ml    Intake/Output this shift: Total I/O In: 240 [P.O.:240] Out: 400 [Urine:400]  Labs: Recent Labs    04/14/23 0612 04/15/23 0858  HGB 8.7* 8.8*   Recent Labs    04/14/23 0612 04/15/23 0858  WBC 7.2 6.5  RBC 3.06* 3.06*  HCT 27.0* 26.2*  PLT 173 215   Recent Labs    04/14/23 0612 04/15/23 0858  NA 138 138  K 3.4* 4.0  CL 108 108  CO2 25 25  BUN 16 14  CREATININE 1.02* 0.93  GLUCOSE 95 88  CALCIUM 7.6* 7.8*   No results for input(s): "LABPT", "INR" in the last 72 hours.   EXAM General - Patient is Alert and Oriented Extremity - Neurovascular intact Sensation intact distally Dorsiflexion/Plantar flexion intact Compartment soft Dressing/Incision - clean, dry, no drainage Motor Function - intact, moving foot and toes well on exam.  Limited physical therapy  Past Medical History:  Diagnosis Date   Adenoma    Chronic leg pain    Colon polyp    DDD (degenerative disc disease), cervical    Diverticulosis of colon    internal----Dr. Mechele Collin   Duodenitis    Esophagitis    GERD (gastroesophageal reflux disease)    Hemorrhoids    HLD (hyperlipidemia)     IBS (irritable bowel syndrome)    with PP diarrhea   Neuropathy    Neuropathy    OA (osteoarthritis) of knee    Osteopenia    PONV (postoperative nausea and vomiting)    Scoliosis    Seborrheic dermatitis    Shortness of breath dyspnea    with activity   TMJ syndrome    Trochanteric bursitis    Unsteady gait    FALLS EASILY    Assessment/Plan: 4 Days Post-Op Procedure(s) (LRB): INTRAMEDULLARY (IM) NAIL INTERTROCHANTERIC (Left) Principal Problem:   Closed hip fracture requiring operative repair, left, sequela Active Problems:   Neuropathy   Parkinsonism   Fall   CKD stage 3a, GFR 45-59 ml/min (HCC)   Neck pain   Acute delirium   Drop in hemoglobin   Hypotension   Constipation  Estimated body mass index is 32.26 kg/m as calculated from the following:   Height as of this encounter: 5\' 2"  (1.575 m).   Weight as of this encounter: 80 kg. Advance diet Up with therapy  Discharge planning: Plan to follow-up at [facility] clinic orthopedics in 2 weeks.  Staple removal and x-rays of the left hip  DVT Prophylaxis - Lovenox, Foot Pumps, and TED hose Weight-Bearing as tolerated to left leg  Dedra Skeens, PA-C Orthopaedic Surgery 04/16/2023, 6:48 AM

## 2023-04-16 NOTE — Plan of Care (Signed)

## 2023-04-17 DIAGNOSIS — K219 Gastro-esophageal reflux disease without esophagitis: Secondary | ICD-10-CM | POA: Diagnosis not present

## 2023-04-17 DIAGNOSIS — G629 Polyneuropathy, unspecified: Secondary | ICD-10-CM | POA: Diagnosis not present

## 2023-04-17 DIAGNOSIS — K5900 Constipation, unspecified: Secondary | ICD-10-CM | POA: Diagnosis not present

## 2023-04-17 DIAGNOSIS — D62 Acute posthemorrhagic anemia: Secondary | ICD-10-CM | POA: Diagnosis not present

## 2023-04-17 DIAGNOSIS — G20C Parkinsonism, unspecified: Secondary | ICD-10-CM | POA: Diagnosis not present

## 2023-04-17 DIAGNOSIS — I10 Essential (primary) hypertension: Secondary | ICD-10-CM | POA: Diagnosis not present

## 2023-04-17 DIAGNOSIS — N1831 Chronic kidney disease, stage 3a: Secondary | ICD-10-CM | POA: Diagnosis not present

## 2023-04-17 DIAGNOSIS — S72142D Displaced intertrochanteric fracture of left femur, subsequent encounter for closed fracture with routine healing: Secondary | ICD-10-CM | POA: Diagnosis not present

## 2023-04-17 DIAGNOSIS — R4182 Altered mental status, unspecified: Secondary | ICD-10-CM | POA: Diagnosis not present

## 2023-04-17 NOTE — Consult Note (Signed)
[facility] manager [facility]) Accountable Care Organization (ACO) [facility] Liaison Note  04/17/2023  Julie [name] 1932-02-28 [id number]  Location: [facility] RN Hospital Liaison screened the patient remotely at [facility].  Insurance: [employer]   Julie [name] is a 89 y.o. female who is a Primary Care Patient of [facility], Audrie Gallus, MD. The patient was screened for readmission hospitalization with noted low risk score for unplanned readmission risk with 1 IP/1 ED in 6 months.  The patient was assessed for potential [facility]) Care Management service needs for post hospital transition for care coordination. Review of patient's electronic medical record reveals patient discharged to SNF as this facility will manage pt ongoing needs. [facility] PAC-RN does not follow patients at SNF for this insurance carrier.   [facility] Care Management/Population Health does not replace or interfere with any arrangements made by the Inpatient Transition of Care team.   For questions contact:   Elliot Cousin, RN, BSN [facility] Liaison [place]   [facility]  Population Health Office Hours MTWF  8:00 am-6:00 pm Off on Thursday [phone] mobile [phone] [Office toll free line][facility] Office Hours are M-F 8:30 - 5 pm 24 hour nurse advise line [phone] Concierge  [email or web address]

## 2023-04-20 DIAGNOSIS — I10 Essential (primary) hypertension: Secondary | ICD-10-CM | POA: Diagnosis not present

## 2023-04-20 DIAGNOSIS — Z7901 Long term (current) use of anticoagulants: Secondary | ICD-10-CM | POA: Diagnosis not present

## 2023-04-20 DIAGNOSIS — S4992XA Unspecified injury of left shoulder and upper arm, initial encounter: Secondary | ICD-10-CM | POA: Diagnosis not present

## 2023-04-20 DIAGNOSIS — K5900 Constipation, unspecified: Secondary | ICD-10-CM | POA: Diagnosis not present

## 2023-04-20 DIAGNOSIS — S72142D Displaced intertrochanteric fracture of left femur, subsequent encounter for closed fracture with routine healing: Secondary | ICD-10-CM | POA: Diagnosis not present

## 2023-04-20 DIAGNOSIS — K219 Gastro-esophageal reflux disease without esophagitis: Secondary | ICD-10-CM | POA: Diagnosis not present

## 2023-04-20 DIAGNOSIS — N1831 Chronic kidney disease, stage 3a: Secondary | ICD-10-CM | POA: Diagnosis not present

## 2023-04-21 DIAGNOSIS — N1831 Chronic kidney disease, stage 3a: Secondary | ICD-10-CM | POA: Diagnosis not present

## 2023-04-21 DIAGNOSIS — S72142D Displaced intertrochanteric fracture of left femur, subsequent encounter for closed fracture with routine healing: Secondary | ICD-10-CM | POA: Diagnosis not present

## 2023-04-21 DIAGNOSIS — G20C Parkinsonism, unspecified: Secondary | ICD-10-CM | POA: Diagnosis not present

## 2023-04-21 DIAGNOSIS — I10 Essential (primary) hypertension: Secondary | ICD-10-CM | POA: Diagnosis not present

## 2023-04-21 DIAGNOSIS — K5900 Constipation, unspecified: Secondary | ICD-10-CM | POA: Diagnosis not present

## 2023-04-21 DIAGNOSIS — D62 Acute posthemorrhagic anemia: Secondary | ICD-10-CM | POA: Diagnosis not present

## 2023-04-21 DIAGNOSIS — M6281 Muscle weakness (generalized): Secondary | ICD-10-CM | POA: Diagnosis not present

## 2023-04-21 DIAGNOSIS — R4182 Altered mental status, unspecified: Secondary | ICD-10-CM | POA: Diagnosis not present

## 2023-04-21 DIAGNOSIS — G629 Polyneuropathy, unspecified: Secondary | ICD-10-CM | POA: Diagnosis not present

## 2023-04-22 DIAGNOSIS — S72142D Displaced intertrochanteric fracture of left femur, subsequent encounter for closed fracture with routine healing: Secondary | ICD-10-CM | POA: Diagnosis not present

## 2023-04-22 DIAGNOSIS — G629 Polyneuropathy, unspecified: Secondary | ICD-10-CM | POA: Diagnosis not present

## 2023-04-22 DIAGNOSIS — R4182 Altered mental status, unspecified: Secondary | ICD-10-CM | POA: Diagnosis not present

## 2023-04-22 DIAGNOSIS — S4992XA Unspecified injury of left shoulder and upper arm, initial encounter: Secondary | ICD-10-CM | POA: Diagnosis not present

## 2023-04-22 DIAGNOSIS — Z7901 Long term (current) use of anticoagulants: Secondary | ICD-10-CM | POA: Diagnosis not present

## 2023-04-22 DIAGNOSIS — M1990 Unspecified osteoarthritis, unspecified site: Secondary | ICD-10-CM | POA: Diagnosis not present

## 2023-04-22 DIAGNOSIS — M79605 Pain in left leg: Secondary | ICD-10-CM | POA: Diagnosis not present

## 2023-04-22 DIAGNOSIS — K5900 Constipation, unspecified: Secondary | ICD-10-CM | POA: Diagnosis not present

## 2023-04-24 DIAGNOSIS — M79605 Pain in left leg: Secondary | ICD-10-CM | POA: Diagnosis not present

## 2023-04-24 DIAGNOSIS — R4182 Altered mental status, unspecified: Secondary | ICD-10-CM | POA: Diagnosis not present

## 2023-04-24 DIAGNOSIS — S72142D Displaced intertrochanteric fracture of left femur, subsequent encounter for closed fracture with routine healing: Secondary | ICD-10-CM | POA: Diagnosis not present

## 2023-04-24 DIAGNOSIS — S4992XA Unspecified injury of left shoulder and upper arm, initial encounter: Secondary | ICD-10-CM | POA: Diagnosis not present

## 2023-04-24 DIAGNOSIS — Z7901 Long term (current) use of anticoagulants: Secondary | ICD-10-CM | POA: Diagnosis not present

## 2023-04-24 DIAGNOSIS — K5900 Constipation, unspecified: Secondary | ICD-10-CM | POA: Diagnosis not present

## 2023-04-24 DIAGNOSIS — N1831 Chronic kidney disease, stage 3a: Secondary | ICD-10-CM | POA: Diagnosis not present

## 2023-04-24 DIAGNOSIS — M1990 Unspecified osteoarthritis, unspecified site: Secondary | ICD-10-CM | POA: Diagnosis not present

## 2023-04-29 DIAGNOSIS — M1990 Unspecified osteoarthritis, unspecified site: Secondary | ICD-10-CM | POA: Diagnosis not present

## 2023-04-29 DIAGNOSIS — G629 Polyneuropathy, unspecified: Secondary | ICD-10-CM | POA: Diagnosis not present

## 2023-04-29 DIAGNOSIS — S4992XA Unspecified injury of left shoulder and upper arm, initial encounter: Secondary | ICD-10-CM | POA: Diagnosis not present

## 2023-04-29 DIAGNOSIS — R4182 Altered mental status, unspecified: Secondary | ICD-10-CM | POA: Diagnosis not present

## 2023-04-29 DIAGNOSIS — S72142D Displaced intertrochanteric fracture of left femur, subsequent encounter for closed fracture with routine healing: Secondary | ICD-10-CM | POA: Diagnosis not present

## 2023-04-29 DIAGNOSIS — K5900 Constipation, unspecified: Secondary | ICD-10-CM | POA: Diagnosis not present

## 2023-04-29 DIAGNOSIS — N1831 Chronic kidney disease, stage 3a: Secondary | ICD-10-CM | POA: Diagnosis not present

## 2023-04-29 DIAGNOSIS — Z7901 Long term (current) use of anticoagulants: Secondary | ICD-10-CM | POA: Diagnosis not present

## 2023-04-29 DIAGNOSIS — I10 Essential (primary) hypertension: Secondary | ICD-10-CM | POA: Diagnosis not present

## 2023-04-30 DIAGNOSIS — M25552 Pain in left hip: Secondary | ICD-10-CM | POA: Diagnosis not present

## 2023-05-01 DIAGNOSIS — K219 Gastro-esophageal reflux disease without esophagitis: Secondary | ICD-10-CM | POA: Diagnosis not present

## 2023-05-01 DIAGNOSIS — S72142D Displaced intertrochanteric fracture of left femur, subsequent encounter for closed fracture with routine healing: Secondary | ICD-10-CM | POA: Diagnosis not present

## 2023-05-01 DIAGNOSIS — Z7901 Long term (current) use of anticoagulants: Secondary | ICD-10-CM | POA: Diagnosis not present

## 2023-05-01 DIAGNOSIS — K5900 Constipation, unspecified: Secondary | ICD-10-CM | POA: Diagnosis not present

## 2023-05-01 DIAGNOSIS — I10 Essential (primary) hypertension: Secondary | ICD-10-CM | POA: Diagnosis not present

## 2023-05-01 DIAGNOSIS — N1831 Chronic kidney disease, stage 3a: Secondary | ICD-10-CM | POA: Diagnosis not present

## 2023-05-07 DIAGNOSIS — S72142D Displaced intertrochanteric fracture of left femur, subsequent encounter for closed fracture with routine healing: Secondary | ICD-10-CM | POA: Diagnosis not present

## 2023-05-07 DIAGNOSIS — K5900 Constipation, unspecified: Secondary | ICD-10-CM | POA: Diagnosis not present

## 2023-05-07 DIAGNOSIS — R269 Unspecified abnormalities of gait and mobility: Secondary | ICD-10-CM | POA: Diagnosis not present

## 2023-05-07 DIAGNOSIS — J029 Acute pharyngitis, unspecified: Secondary | ICD-10-CM | POA: Diagnosis not present

## 2023-05-07 DIAGNOSIS — M159 Polyosteoarthritis, unspecified: Secondary | ICD-10-CM | POA: Diagnosis not present

## 2023-05-07 DIAGNOSIS — R2681 Unsteadiness on feet: Secondary | ICD-10-CM | POA: Diagnosis not present

## 2023-05-07 DIAGNOSIS — G629 Polyneuropathy, unspecified: Secondary | ICD-10-CM | POA: Diagnosis not present

## 2023-05-07 DIAGNOSIS — G20A1 Parkinson's disease without dyskinesia, without mention of fluctuations: Secondary | ICD-10-CM | POA: Diagnosis not present

## 2023-05-07 DIAGNOSIS — D649 Anemia, unspecified: Secondary | ICD-10-CM | POA: Diagnosis not present

## 2023-05-07 DIAGNOSIS — I10 Essential (primary) hypertension: Secondary | ICD-10-CM | POA: Diagnosis not present

## 2023-05-07 DIAGNOSIS — R5381 Other malaise: Secondary | ICD-10-CM | POA: Diagnosis not present

## 2023-05-07 DIAGNOSIS — S72002A Fracture of unspecified part of neck of left femur, initial encounter for closed fracture: Secondary | ICD-10-CM | POA: Diagnosis not present

## 2023-05-07 DIAGNOSIS — I7091 Generalized atherosclerosis: Secondary | ICD-10-CM | POA: Diagnosis not present

## 2023-05-07 DIAGNOSIS — R058 Other specified cough: Secondary | ICD-10-CM | POA: Diagnosis not present

## 2023-05-07 DIAGNOSIS — G8918 Other acute postprocedural pain: Secondary | ICD-10-CM | POA: Diagnosis not present

## 2023-05-07 DIAGNOSIS — M25562 Pain in left knee: Secondary | ICD-10-CM | POA: Diagnosis not present

## 2023-05-07 DIAGNOSIS — Z79899 Other long term (current) drug therapy: Secondary | ICD-10-CM | POA: Diagnosis not present

## 2023-05-07 DIAGNOSIS — M6281 Muscle weakness (generalized): Secondary | ICD-10-CM | POA: Diagnosis not present

## 2023-05-07 DIAGNOSIS — M1990 Unspecified osteoarthritis, unspecified site: Secondary | ICD-10-CM | POA: Diagnosis not present

## 2023-05-07 DIAGNOSIS — R262 Difficulty in walking, not elsewhere classified: Secondary | ICD-10-CM | POA: Diagnosis not present

## 2023-05-07 DIAGNOSIS — R195 Other fecal abnormalities: Secondary | ICD-10-CM | POA: Diagnosis not present

## 2023-05-07 DIAGNOSIS — N183 Chronic kidney disease, stage 3 unspecified: Secondary | ICD-10-CM | POA: Diagnosis not present

## 2023-05-07 DIAGNOSIS — R41841 Cognitive communication deficit: Secondary | ICD-10-CM | POA: Diagnosis not present

## 2023-05-08 DIAGNOSIS — G629 Polyneuropathy, unspecified: Secondary | ICD-10-CM | POA: Diagnosis not present

## 2023-05-08 DIAGNOSIS — D649 Anemia, unspecified: Secondary | ICD-10-CM | POA: Diagnosis not present

## 2023-05-08 DIAGNOSIS — S72002A Fracture of unspecified part of neck of left femur, initial encounter for closed fracture: Secondary | ICD-10-CM | POA: Diagnosis not present

## 2023-05-08 DIAGNOSIS — I10 Essential (primary) hypertension: Secondary | ICD-10-CM | POA: Diagnosis not present

## 2023-05-08 DIAGNOSIS — G20A1 Parkinson's disease without dyskinesia, without mention of fluctuations: Secondary | ICD-10-CM | POA: Diagnosis not present

## 2023-05-08 DIAGNOSIS — Z79899 Other long term (current) drug therapy: Secondary | ICD-10-CM | POA: Diagnosis not present

## 2023-05-11 DIAGNOSIS — S72002A Fracture of unspecified part of neck of left femur, initial encounter for closed fracture: Secondary | ICD-10-CM | POA: Diagnosis not present

## 2023-05-11 DIAGNOSIS — R195 Other fecal abnormalities: Secondary | ICD-10-CM | POA: Diagnosis not present

## 2023-05-13 DIAGNOSIS — N183 Chronic kidney disease, stage 3 unspecified: Secondary | ICD-10-CM | POA: Diagnosis not present

## 2023-05-13 DIAGNOSIS — S72142D Displaced intertrochanteric fracture of left femur, subsequent encounter for closed fracture with routine healing: Secondary | ICD-10-CM | POA: Diagnosis not present

## 2023-05-13 DIAGNOSIS — M6281 Muscle weakness (generalized): Secondary | ICD-10-CM | POA: Diagnosis not present

## 2023-05-13 DIAGNOSIS — G629 Polyneuropathy, unspecified: Secondary | ICD-10-CM | POA: Diagnosis not present

## 2023-05-13 DIAGNOSIS — G20A1 Parkinson's disease without dyskinesia, without mention of fluctuations: Secondary | ICD-10-CM | POA: Diagnosis not present

## 2023-05-13 DIAGNOSIS — M25562 Pain in left knee: Secondary | ICD-10-CM | POA: Diagnosis not present

## 2023-05-13 DIAGNOSIS — G8918 Other acute postprocedural pain: Secondary | ICD-10-CM | POA: Diagnosis not present

## 2023-05-13 DIAGNOSIS — M159 Polyosteoarthritis, unspecified: Secondary | ICD-10-CM | POA: Diagnosis not present

## 2023-05-13 DIAGNOSIS — K5900 Constipation, unspecified: Secondary | ICD-10-CM | POA: Diagnosis not present

## 2023-05-15 DIAGNOSIS — G20A1 Parkinson's disease without dyskinesia, without mention of fluctuations: Secondary | ICD-10-CM | POA: Diagnosis not present

## 2023-05-15 DIAGNOSIS — G629 Polyneuropathy, unspecified: Secondary | ICD-10-CM | POA: Diagnosis not present

## 2023-05-15 DIAGNOSIS — S72002A Fracture of unspecified part of neck of left femur, initial encounter for closed fracture: Secondary | ICD-10-CM | POA: Diagnosis not present

## 2023-05-15 DIAGNOSIS — I10 Essential (primary) hypertension: Secondary | ICD-10-CM | POA: Diagnosis not present

## 2023-05-20 DIAGNOSIS — K5900 Constipation, unspecified: Secondary | ICD-10-CM | POA: Diagnosis not present

## 2023-05-20 DIAGNOSIS — R058 Other specified cough: Secondary | ICD-10-CM | POA: Diagnosis not present

## 2023-05-20 DIAGNOSIS — G20A1 Parkinson's disease without dyskinesia, without mention of fluctuations: Secondary | ICD-10-CM | POA: Diagnosis not present

## 2023-05-20 DIAGNOSIS — N183 Chronic kidney disease, stage 3 unspecified: Secondary | ICD-10-CM | POA: Diagnosis not present

## 2023-05-20 DIAGNOSIS — M6281 Muscle weakness (generalized): Secondary | ICD-10-CM | POA: Diagnosis not present

## 2023-05-20 DIAGNOSIS — G8918 Other acute postprocedural pain: Secondary | ICD-10-CM | POA: Diagnosis not present

## 2023-05-20 DIAGNOSIS — S72142D Displaced intertrochanteric fracture of left femur, subsequent encounter for closed fracture with routine healing: Secondary | ICD-10-CM | POA: Diagnosis not present

## 2023-05-20 DIAGNOSIS — M25562 Pain in left knee: Secondary | ICD-10-CM | POA: Diagnosis not present

## 2023-05-20 DIAGNOSIS — G629 Polyneuropathy, unspecified: Secondary | ICD-10-CM | POA: Diagnosis not present

## 2023-05-20 DIAGNOSIS — M159 Polyosteoarthritis, unspecified: Secondary | ICD-10-CM | POA: Diagnosis not present

## 2023-05-20 DIAGNOSIS — J029 Acute pharyngitis, unspecified: Secondary | ICD-10-CM | POA: Diagnosis not present

## 2023-05-26 DIAGNOSIS — I7091 Generalized atherosclerosis: Secondary | ICD-10-CM | POA: Diagnosis not present

## 2023-05-27 DIAGNOSIS — G8918 Other acute postprocedural pain: Secondary | ICD-10-CM | POA: Diagnosis not present

## 2023-05-27 DIAGNOSIS — M25562 Pain in left knee: Secondary | ICD-10-CM | POA: Diagnosis not present

## 2023-05-27 DIAGNOSIS — S72142D Displaced intertrochanteric fracture of left femur, subsequent encounter for closed fracture with routine healing: Secondary | ICD-10-CM | POA: Diagnosis not present

## 2023-05-27 DIAGNOSIS — R5381 Other malaise: Secondary | ICD-10-CM | POA: Diagnosis not present

## 2023-05-27 DIAGNOSIS — R269 Unspecified abnormalities of gait and mobility: Secondary | ICD-10-CM | POA: Diagnosis not present

## 2023-05-27 DIAGNOSIS — M6281 Muscle weakness (generalized): Secondary | ICD-10-CM | POA: Diagnosis not present

## 2023-05-27 DIAGNOSIS — G20A1 Parkinson's disease without dyskinesia, without mention of fluctuations: Secondary | ICD-10-CM | POA: Diagnosis not present

## 2023-05-27 DIAGNOSIS — M159 Polyosteoarthritis, unspecified: Secondary | ICD-10-CM | POA: Diagnosis not present

## 2023-06-01 DIAGNOSIS — M6281 Muscle weakness (generalized): Secondary | ICD-10-CM | POA: Diagnosis not present

## 2023-06-01 DIAGNOSIS — R262 Difficulty in walking, not elsewhere classified: Secondary | ICD-10-CM | POA: Diagnosis not present

## 2023-06-01 DIAGNOSIS — R2681 Unsteadiness on feet: Secondary | ICD-10-CM | POA: Diagnosis not present

## 2023-06-01 DIAGNOSIS — R41841 Cognitive communication deficit: Secondary | ICD-10-CM | POA: Diagnosis not present

## 2023-06-01 DIAGNOSIS — G20A1 Parkinson's disease without dyskinesia, without mention of fluctuations: Secondary | ICD-10-CM | POA: Diagnosis not present

## 2023-06-02 DIAGNOSIS — R41841 Cognitive communication deficit: Secondary | ICD-10-CM | POA: Diagnosis not present

## 2023-06-02 DIAGNOSIS — R262 Difficulty in walking, not elsewhere classified: Secondary | ICD-10-CM | POA: Diagnosis not present

## 2023-06-02 DIAGNOSIS — R2681 Unsteadiness on feet: Secondary | ICD-10-CM | POA: Diagnosis not present

## 2023-06-02 DIAGNOSIS — M6281 Muscle weakness (generalized): Secondary | ICD-10-CM | POA: Diagnosis not present

## 2023-06-03 DIAGNOSIS — R262 Difficulty in walking, not elsewhere classified: Secondary | ICD-10-CM | POA: Diagnosis not present

## 2023-06-03 DIAGNOSIS — R41841 Cognitive communication deficit: Secondary | ICD-10-CM | POA: Diagnosis not present

## 2023-06-03 DIAGNOSIS — M6281 Muscle weakness (generalized): Secondary | ICD-10-CM | POA: Diagnosis not present

## 2023-06-03 DIAGNOSIS — R2681 Unsteadiness on feet: Secondary | ICD-10-CM | POA: Diagnosis not present

## 2023-06-04 DIAGNOSIS — R2681 Unsteadiness on feet: Secondary | ICD-10-CM | POA: Diagnosis not present

## 2023-06-04 DIAGNOSIS — M6281 Muscle weakness (generalized): Secondary | ICD-10-CM | POA: Diagnosis not present

## 2023-06-04 DIAGNOSIS — R41841 Cognitive communication deficit: Secondary | ICD-10-CM | POA: Diagnosis not present

## 2023-06-04 DIAGNOSIS — R262 Difficulty in walking, not elsewhere classified: Secondary | ICD-10-CM | POA: Diagnosis not present

## 2023-06-05 DIAGNOSIS — R2681 Unsteadiness on feet: Secondary | ICD-10-CM | POA: Diagnosis not present

## 2023-06-05 DIAGNOSIS — R262 Difficulty in walking, not elsewhere classified: Secondary | ICD-10-CM | POA: Diagnosis not present

## 2023-06-05 DIAGNOSIS — M6281 Muscle weakness (generalized): Secondary | ICD-10-CM | POA: Diagnosis not present

## 2023-06-05 DIAGNOSIS — R41841 Cognitive communication deficit: Secondary | ICD-10-CM | POA: Diagnosis not present

## 2023-06-08 DIAGNOSIS — R2681 Unsteadiness on feet: Secondary | ICD-10-CM | POA: Diagnosis not present

## 2023-06-08 DIAGNOSIS — R262 Difficulty in walking, not elsewhere classified: Secondary | ICD-10-CM | POA: Diagnosis not present

## 2023-06-08 DIAGNOSIS — M6281 Muscle weakness (generalized): Secondary | ICD-10-CM | POA: Diagnosis not present

## 2023-06-08 DIAGNOSIS — R41841 Cognitive communication deficit: Secondary | ICD-10-CM | POA: Diagnosis not present

## 2023-06-09 DIAGNOSIS — R262 Difficulty in walking, not elsewhere classified: Secondary | ICD-10-CM | POA: Diagnosis not present

## 2023-06-09 DIAGNOSIS — R41841 Cognitive communication deficit: Secondary | ICD-10-CM | POA: Diagnosis not present

## 2023-06-09 DIAGNOSIS — M6281 Muscle weakness (generalized): Secondary | ICD-10-CM | POA: Diagnosis not present

## 2023-06-09 DIAGNOSIS — R2681 Unsteadiness on feet: Secondary | ICD-10-CM | POA: Diagnosis not present

## 2023-06-10 DIAGNOSIS — M6281 Muscle weakness (generalized): Secondary | ICD-10-CM | POA: Diagnosis not present

## 2023-06-10 DIAGNOSIS — R262 Difficulty in walking, not elsewhere classified: Secondary | ICD-10-CM | POA: Diagnosis not present

## 2023-06-10 DIAGNOSIS — R41841 Cognitive communication deficit: Secondary | ICD-10-CM | POA: Diagnosis not present

## 2023-06-10 DIAGNOSIS — R2681 Unsteadiness on feet: Secondary | ICD-10-CM | POA: Diagnosis not present

## 2023-06-11 DIAGNOSIS — R262 Difficulty in walking, not elsewhere classified: Secondary | ICD-10-CM | POA: Diagnosis not present

## 2023-06-11 DIAGNOSIS — R41841 Cognitive communication deficit: Secondary | ICD-10-CM | POA: Diagnosis not present

## 2023-06-11 DIAGNOSIS — M6281 Muscle weakness (generalized): Secondary | ICD-10-CM | POA: Diagnosis not present

## 2023-06-11 DIAGNOSIS — R2681 Unsteadiness on feet: Secondary | ICD-10-CM | POA: Diagnosis not present

## 2023-06-12 DIAGNOSIS — R262 Difficulty in walking, not elsewhere classified: Secondary | ICD-10-CM | POA: Diagnosis not present

## 2023-06-12 DIAGNOSIS — M6281 Muscle weakness (generalized): Secondary | ICD-10-CM | POA: Diagnosis not present

## 2023-06-12 DIAGNOSIS — R41841 Cognitive communication deficit: Secondary | ICD-10-CM | POA: Diagnosis not present

## 2023-06-12 DIAGNOSIS — R2681 Unsteadiness on feet: Secondary | ICD-10-CM | POA: Diagnosis not present

## 2023-06-14 DIAGNOSIS — R41841 Cognitive communication deficit: Secondary | ICD-10-CM | POA: Diagnosis not present

## 2023-06-14 DIAGNOSIS — R2681 Unsteadiness on feet: Secondary | ICD-10-CM | POA: Diagnosis not present

## 2023-06-14 DIAGNOSIS — R262 Difficulty in walking, not elsewhere classified: Secondary | ICD-10-CM | POA: Diagnosis not present

## 2023-06-14 DIAGNOSIS — M6281 Muscle weakness (generalized): Secondary | ICD-10-CM | POA: Diagnosis not present

## 2023-06-15 DIAGNOSIS — R2681 Unsteadiness on feet: Secondary | ICD-10-CM | POA: Diagnosis not present

## 2023-06-15 DIAGNOSIS — R41841 Cognitive communication deficit: Secondary | ICD-10-CM | POA: Diagnosis not present

## 2023-06-15 DIAGNOSIS — R262 Difficulty in walking, not elsewhere classified: Secondary | ICD-10-CM | POA: Diagnosis not present

## 2023-06-15 DIAGNOSIS — M6281 Muscle weakness (generalized): Secondary | ICD-10-CM | POA: Diagnosis not present

## 2023-06-16 DIAGNOSIS — R41841 Cognitive communication deficit: Secondary | ICD-10-CM | POA: Diagnosis not present

## 2023-06-16 DIAGNOSIS — R262 Difficulty in walking, not elsewhere classified: Secondary | ICD-10-CM | POA: Diagnosis not present

## 2023-06-16 DIAGNOSIS — M6281 Muscle weakness (generalized): Secondary | ICD-10-CM | POA: Diagnosis not present

## 2023-06-16 DIAGNOSIS — R2681 Unsteadiness on feet: Secondary | ICD-10-CM | POA: Diagnosis not present

## 2023-06-17 DIAGNOSIS — R2681 Unsteadiness on feet: Secondary | ICD-10-CM | POA: Diagnosis not present

## 2023-06-17 DIAGNOSIS — M6281 Muscle weakness (generalized): Secondary | ICD-10-CM | POA: Diagnosis not present

## 2023-06-17 DIAGNOSIS — R41841 Cognitive communication deficit: Secondary | ICD-10-CM | POA: Diagnosis not present

## 2023-06-17 DIAGNOSIS — R262 Difficulty in walking, not elsewhere classified: Secondary | ICD-10-CM | POA: Diagnosis not present

## 2023-06-18 DIAGNOSIS — R262 Difficulty in walking, not elsewhere classified: Secondary | ICD-10-CM | POA: Diagnosis not present

## 2023-06-18 DIAGNOSIS — R41841 Cognitive communication deficit: Secondary | ICD-10-CM | POA: Diagnosis not present

## 2023-06-18 DIAGNOSIS — M6281 Muscle weakness (generalized): Secondary | ICD-10-CM | POA: Diagnosis not present

## 2023-06-18 DIAGNOSIS — R2681 Unsteadiness on feet: Secondary | ICD-10-CM | POA: Diagnosis not present

## 2023-06-19 DIAGNOSIS — R41841 Cognitive communication deficit: Secondary | ICD-10-CM | POA: Diagnosis not present

## 2023-06-19 DIAGNOSIS — R2681 Unsteadiness on feet: Secondary | ICD-10-CM | POA: Diagnosis not present

## 2023-06-19 DIAGNOSIS — M6281 Muscle weakness (generalized): Secondary | ICD-10-CM | POA: Diagnosis not present

## 2023-06-19 DIAGNOSIS — R262 Difficulty in walking, not elsewhere classified: Secondary | ICD-10-CM | POA: Diagnosis not present

## 2023-06-21 DIAGNOSIS — R262 Difficulty in walking, not elsewhere classified: Secondary | ICD-10-CM | POA: Diagnosis not present

## 2023-06-21 DIAGNOSIS — R41841 Cognitive communication deficit: Secondary | ICD-10-CM | POA: Diagnosis not present

## 2023-06-21 DIAGNOSIS — R2681 Unsteadiness on feet: Secondary | ICD-10-CM | POA: Diagnosis not present

## 2023-06-21 DIAGNOSIS — M6281 Muscle weakness (generalized): Secondary | ICD-10-CM | POA: Diagnosis not present

## 2023-06-22 DIAGNOSIS — M6281 Muscle weakness (generalized): Secondary | ICD-10-CM | POA: Diagnosis not present

## 2023-06-22 DIAGNOSIS — R262 Difficulty in walking, not elsewhere classified: Secondary | ICD-10-CM | POA: Diagnosis not present

## 2023-06-22 DIAGNOSIS — R41841 Cognitive communication deficit: Secondary | ICD-10-CM | POA: Diagnosis not present

## 2023-06-22 DIAGNOSIS — R2681 Unsteadiness on feet: Secondary | ICD-10-CM | POA: Diagnosis not present

## 2023-06-23 DIAGNOSIS — R41841 Cognitive communication deficit: Secondary | ICD-10-CM | POA: Diagnosis not present

## 2023-06-23 DIAGNOSIS — R262 Difficulty in walking, not elsewhere classified: Secondary | ICD-10-CM | POA: Diagnosis not present

## 2023-06-23 DIAGNOSIS — M6281 Muscle weakness (generalized): Secondary | ICD-10-CM | POA: Diagnosis not present

## 2023-06-23 DIAGNOSIS — R2681 Unsteadiness on feet: Secondary | ICD-10-CM | POA: Diagnosis not present

## 2023-06-24 DIAGNOSIS — R262 Difficulty in walking, not elsewhere classified: Secondary | ICD-10-CM | POA: Diagnosis not present

## 2023-06-24 DIAGNOSIS — R41841 Cognitive communication deficit: Secondary | ICD-10-CM | POA: Diagnosis not present

## 2023-06-24 DIAGNOSIS — R2681 Unsteadiness on feet: Secondary | ICD-10-CM | POA: Diagnosis not present

## 2023-06-24 DIAGNOSIS — M6281 Muscle weakness (generalized): Secondary | ICD-10-CM | POA: Diagnosis not present

## 2023-06-25 DIAGNOSIS — R41841 Cognitive communication deficit: Secondary | ICD-10-CM | POA: Diagnosis not present

## 2023-06-25 DIAGNOSIS — M6281 Muscle weakness (generalized): Secondary | ICD-10-CM | POA: Diagnosis not present

## 2023-06-25 DIAGNOSIS — R262 Difficulty in walking, not elsewhere classified: Secondary | ICD-10-CM | POA: Diagnosis not present

## 2023-06-25 DIAGNOSIS — R2681 Unsteadiness on feet: Secondary | ICD-10-CM | POA: Diagnosis not present

## 2023-06-26 DIAGNOSIS — R2681 Unsteadiness on feet: Secondary | ICD-10-CM | POA: Diagnosis not present

## 2023-06-26 DIAGNOSIS — R41841 Cognitive communication deficit: Secondary | ICD-10-CM | POA: Diagnosis not present

## 2023-06-26 DIAGNOSIS — R262 Difficulty in walking, not elsewhere classified: Secondary | ICD-10-CM | POA: Diagnosis not present

## 2023-06-26 DIAGNOSIS — M6281 Muscle weakness (generalized): Secondary | ICD-10-CM | POA: Diagnosis not present

## 2023-06-27 DIAGNOSIS — R2681 Unsteadiness on feet: Secondary | ICD-10-CM | POA: Diagnosis not present

## 2023-06-27 DIAGNOSIS — R41841 Cognitive communication deficit: Secondary | ICD-10-CM | POA: Diagnosis not present

## 2023-06-27 DIAGNOSIS — R262 Difficulty in walking, not elsewhere classified: Secondary | ICD-10-CM | POA: Diagnosis not present

## 2023-06-27 DIAGNOSIS — M6281 Muscle weakness (generalized): Secondary | ICD-10-CM | POA: Diagnosis not present

## 2023-06-29 DIAGNOSIS — M6281 Muscle weakness (generalized): Secondary | ICD-10-CM | POA: Diagnosis not present

## 2023-06-29 DIAGNOSIS — R2681 Unsteadiness on feet: Secondary | ICD-10-CM | POA: Diagnosis not present

## 2023-06-29 DIAGNOSIS — R41841 Cognitive communication deficit: Secondary | ICD-10-CM | POA: Diagnosis not present

## 2023-06-29 DIAGNOSIS — R262 Difficulty in walking, not elsewhere classified: Secondary | ICD-10-CM | POA: Diagnosis not present

## 2023-06-30 DIAGNOSIS — R262 Difficulty in walking, not elsewhere classified: Secondary | ICD-10-CM | POA: Diagnosis not present

## 2023-06-30 DIAGNOSIS — R41841 Cognitive communication deficit: Secondary | ICD-10-CM | POA: Diagnosis not present

## 2023-06-30 DIAGNOSIS — R2681 Unsteadiness on feet: Secondary | ICD-10-CM | POA: Diagnosis not present

## 2023-06-30 DIAGNOSIS — M6281 Muscle weakness (generalized): Secondary | ICD-10-CM | POA: Diagnosis not present

## 2023-07-01 DIAGNOSIS — R41841 Cognitive communication deficit: Secondary | ICD-10-CM | POA: Diagnosis not present

## 2023-07-01 DIAGNOSIS — R2681 Unsteadiness on feet: Secondary | ICD-10-CM | POA: Diagnosis not present

## 2023-07-01 DIAGNOSIS — R262 Difficulty in walking, not elsewhere classified: Secondary | ICD-10-CM | POA: Diagnosis not present

## 2023-07-01 DIAGNOSIS — M6281 Muscle weakness (generalized): Secondary | ICD-10-CM | POA: Diagnosis not present

## 2023-07-02 DIAGNOSIS — R262 Difficulty in walking, not elsewhere classified: Secondary | ICD-10-CM | POA: Diagnosis not present

## 2023-07-02 DIAGNOSIS — M6281 Muscle weakness (generalized): Secondary | ICD-10-CM | POA: Diagnosis not present

## 2023-07-02 DIAGNOSIS — R41841 Cognitive communication deficit: Secondary | ICD-10-CM | POA: Diagnosis not present

## 2023-07-02 DIAGNOSIS — R2681 Unsteadiness on feet: Secondary | ICD-10-CM | POA: Diagnosis not present

## 2023-07-03 DIAGNOSIS — R41841 Cognitive communication deficit: Secondary | ICD-10-CM | POA: Diagnosis not present

## 2023-07-03 DIAGNOSIS — S72142A Displaced intertrochanteric fracture of left femur, initial encounter for closed fracture: Secondary | ICD-10-CM | POA: Diagnosis not present

## 2023-07-03 DIAGNOSIS — R2681 Unsteadiness on feet: Secondary | ICD-10-CM | POA: Diagnosis not present

## 2023-07-03 DIAGNOSIS — M898X6 Other specified disorders of bone, lower leg: Secondary | ICD-10-CM | POA: Diagnosis not present

## 2023-07-03 DIAGNOSIS — R262 Difficulty in walking, not elsewhere classified: Secondary | ICD-10-CM | POA: Diagnosis not present

## 2023-07-03 DIAGNOSIS — M25552 Pain in left hip: Secondary | ICD-10-CM | POA: Diagnosis not present

## 2023-07-03 DIAGNOSIS — M6281 Muscle weakness (generalized): Secondary | ICD-10-CM | POA: Diagnosis not present

## 2023-07-05 DIAGNOSIS — R2681 Unsteadiness on feet: Secondary | ICD-10-CM | POA: Diagnosis not present

## 2023-07-05 DIAGNOSIS — R262 Difficulty in walking, not elsewhere classified: Secondary | ICD-10-CM | POA: Diagnosis not present

## 2023-07-05 DIAGNOSIS — R41841 Cognitive communication deficit: Secondary | ICD-10-CM | POA: Diagnosis not present

## 2023-07-05 DIAGNOSIS — M6281 Muscle weakness (generalized): Secondary | ICD-10-CM | POA: Diagnosis not present

## 2023-07-06 DIAGNOSIS — U071 COVID-19: Secondary | ICD-10-CM | POA: Diagnosis not present

## 2023-07-06 DIAGNOSIS — M6281 Muscle weakness (generalized): Secondary | ICD-10-CM | POA: Diagnosis not present

## 2023-07-06 DIAGNOSIS — R41841 Cognitive communication deficit: Secondary | ICD-10-CM | POA: Diagnosis not present

## 2023-07-06 DIAGNOSIS — I10 Essential (primary) hypertension: Secondary | ICD-10-CM | POA: Diagnosis not present

## 2023-07-06 DIAGNOSIS — Z79899 Other long term (current) drug therapy: Secondary | ICD-10-CM | POA: Diagnosis not present

## 2023-07-06 DIAGNOSIS — G20A1 Parkinson's disease without dyskinesia, without mention of fluctuations: Secondary | ICD-10-CM | POA: Diagnosis not present

## 2023-07-06 DIAGNOSIS — R2681 Unsteadiness on feet: Secondary | ICD-10-CM | POA: Diagnosis not present

## 2023-07-06 DIAGNOSIS — R262 Difficulty in walking, not elsewhere classified: Secondary | ICD-10-CM | POA: Diagnosis not present

## 2023-07-06 DIAGNOSIS — Z20828 Contact with and (suspected) exposure to other viral communicable diseases: Secondary | ICD-10-CM | POA: Diagnosis not present

## 2023-07-07 DIAGNOSIS — R41841 Cognitive communication deficit: Secondary | ICD-10-CM | POA: Diagnosis not present

## 2023-07-07 DIAGNOSIS — R2681 Unsteadiness on feet: Secondary | ICD-10-CM | POA: Diagnosis not present

## 2023-07-07 DIAGNOSIS — M6281 Muscle weakness (generalized): Secondary | ICD-10-CM | POA: Diagnosis not present

## 2023-07-07 DIAGNOSIS — R262 Difficulty in walking, not elsewhere classified: Secondary | ICD-10-CM | POA: Diagnosis not present

## 2023-07-08 DIAGNOSIS — R262 Difficulty in walking, not elsewhere classified: Secondary | ICD-10-CM | POA: Diagnosis not present

## 2023-07-08 DIAGNOSIS — R41841 Cognitive communication deficit: Secondary | ICD-10-CM | POA: Diagnosis not present

## 2023-07-08 DIAGNOSIS — M6281 Muscle weakness (generalized): Secondary | ICD-10-CM | POA: Diagnosis not present

## 2023-07-08 DIAGNOSIS — U071 COVID-19: Secondary | ICD-10-CM | POA: Diagnosis not present

## 2023-07-08 DIAGNOSIS — R2681 Unsteadiness on feet: Secondary | ICD-10-CM | POA: Diagnosis not present

## 2023-07-09 DIAGNOSIS — U071 COVID-19: Secondary | ICD-10-CM | POA: Diagnosis not present

## 2023-07-09 DIAGNOSIS — G20A1 Parkinson's disease without dyskinesia, without mention of fluctuations: Secondary | ICD-10-CM | POA: Diagnosis not present

## 2023-07-09 DIAGNOSIS — R262 Difficulty in walking, not elsewhere classified: Secondary | ICD-10-CM | POA: Diagnosis not present

## 2023-07-09 DIAGNOSIS — R2681 Unsteadiness on feet: Secondary | ICD-10-CM | POA: Diagnosis not present

## 2023-07-09 DIAGNOSIS — R41841 Cognitive communication deficit: Secondary | ICD-10-CM | POA: Diagnosis not present

## 2023-07-09 DIAGNOSIS — D631 Anemia in chronic kidney disease: Secondary | ICD-10-CM | POA: Diagnosis not present

## 2023-07-09 DIAGNOSIS — M6281 Muscle weakness (generalized): Secondary | ICD-10-CM | POA: Diagnosis not present

## 2023-07-10 DIAGNOSIS — R262 Difficulty in walking, not elsewhere classified: Secondary | ICD-10-CM | POA: Diagnosis not present

## 2023-07-10 DIAGNOSIS — M6281 Muscle weakness (generalized): Secondary | ICD-10-CM | POA: Diagnosis not present

## 2023-07-10 DIAGNOSIS — R2681 Unsteadiness on feet: Secondary | ICD-10-CM | POA: Diagnosis not present

## 2023-07-10 DIAGNOSIS — R41841 Cognitive communication deficit: Secondary | ICD-10-CM | POA: Diagnosis not present

## 2023-07-11 DIAGNOSIS — M6281 Muscle weakness (generalized): Secondary | ICD-10-CM | POA: Diagnosis not present

## 2023-07-11 DIAGNOSIS — R41841 Cognitive communication deficit: Secondary | ICD-10-CM | POA: Diagnosis not present

## 2023-07-11 DIAGNOSIS — R262 Difficulty in walking, not elsewhere classified: Secondary | ICD-10-CM | POA: Diagnosis not present

## 2023-07-11 DIAGNOSIS — R2681 Unsteadiness on feet: Secondary | ICD-10-CM | POA: Diagnosis not present

## 2023-07-13 DIAGNOSIS — R2681 Unsteadiness on feet: Secondary | ICD-10-CM | POA: Diagnosis not present

## 2023-07-13 DIAGNOSIS — R262 Difficulty in walking, not elsewhere classified: Secondary | ICD-10-CM | POA: Diagnosis not present

## 2023-07-13 DIAGNOSIS — R41841 Cognitive communication deficit: Secondary | ICD-10-CM | POA: Diagnosis not present

## 2023-07-13 DIAGNOSIS — M6281 Muscle weakness (generalized): Secondary | ICD-10-CM | POA: Diagnosis not present

## 2023-07-14 DIAGNOSIS — M6281 Muscle weakness (generalized): Secondary | ICD-10-CM | POA: Diagnosis not present

## 2023-07-14 DIAGNOSIS — R262 Difficulty in walking, not elsewhere classified: Secondary | ICD-10-CM | POA: Diagnosis not present

## 2023-07-14 DIAGNOSIS — R41841 Cognitive communication deficit: Secondary | ICD-10-CM | POA: Diagnosis not present

## 2023-07-14 DIAGNOSIS — R2681 Unsteadiness on feet: Secondary | ICD-10-CM | POA: Diagnosis not present

## 2023-07-15 DIAGNOSIS — R41841 Cognitive communication deficit: Secondary | ICD-10-CM | POA: Diagnosis not present

## 2023-07-15 DIAGNOSIS — G629 Polyneuropathy, unspecified: Secondary | ICD-10-CM | POA: Diagnosis not present

## 2023-07-15 DIAGNOSIS — R2681 Unsteadiness on feet: Secondary | ICD-10-CM | POA: Diagnosis not present

## 2023-07-15 DIAGNOSIS — R262 Difficulty in walking, not elsewhere classified: Secondary | ICD-10-CM | POA: Diagnosis not present

## 2023-07-15 DIAGNOSIS — M25569 Pain in unspecified knee: Secondary | ICD-10-CM | POA: Diagnosis not present

## 2023-07-15 DIAGNOSIS — M6281 Muscle weakness (generalized): Secondary | ICD-10-CM | POA: Diagnosis not present

## 2023-07-15 DIAGNOSIS — I10 Essential (primary) hypertension: Secondary | ICD-10-CM | POA: Diagnosis not present

## 2023-07-15 DIAGNOSIS — G20A1 Parkinson's disease without dyskinesia, without mention of fluctuations: Secondary | ICD-10-CM | POA: Diagnosis not present

## 2023-07-15 DIAGNOSIS — M25552 Pain in left hip: Secondary | ICD-10-CM | POA: Diagnosis not present

## 2023-07-16 DIAGNOSIS — M6281 Muscle weakness (generalized): Secondary | ICD-10-CM | POA: Diagnosis not present

## 2023-07-16 DIAGNOSIS — R2681 Unsteadiness on feet: Secondary | ICD-10-CM | POA: Diagnosis not present

## 2023-07-16 DIAGNOSIS — R262 Difficulty in walking, not elsewhere classified: Secondary | ICD-10-CM | POA: Diagnosis not present

## 2023-07-16 DIAGNOSIS — R41841 Cognitive communication deficit: Secondary | ICD-10-CM | POA: Diagnosis not present

## 2023-07-17 DIAGNOSIS — M159 Polyosteoarthritis, unspecified: Secondary | ICD-10-CM | POA: Diagnosis not present

## 2023-07-17 DIAGNOSIS — G8918 Other acute postprocedural pain: Secondary | ICD-10-CM | POA: Diagnosis not present

## 2023-07-17 DIAGNOSIS — R5381 Other malaise: Secondary | ICD-10-CM | POA: Diagnosis not present

## 2023-07-17 DIAGNOSIS — R2681 Unsteadiness on feet: Secondary | ICD-10-CM | POA: Diagnosis not present

## 2023-07-17 DIAGNOSIS — S72142D Displaced intertrochanteric fracture of left femur, subsequent encounter for closed fracture with routine healing: Secondary | ICD-10-CM | POA: Diagnosis not present

## 2023-07-17 DIAGNOSIS — M25562 Pain in left knee: Secondary | ICD-10-CM | POA: Diagnosis not present

## 2023-07-17 DIAGNOSIS — R41841 Cognitive communication deficit: Secondary | ICD-10-CM | POA: Diagnosis not present

## 2023-07-17 DIAGNOSIS — R262 Difficulty in walking, not elsewhere classified: Secondary | ICD-10-CM | POA: Diagnosis not present

## 2023-07-17 DIAGNOSIS — M6281 Muscle weakness (generalized): Secondary | ICD-10-CM | POA: Diagnosis not present

## 2023-07-17 DIAGNOSIS — R269 Unspecified abnormalities of gait and mobility: Secondary | ICD-10-CM | POA: Diagnosis not present

## 2023-07-20 DIAGNOSIS — R2681 Unsteadiness on feet: Secondary | ICD-10-CM | POA: Diagnosis not present

## 2023-07-20 DIAGNOSIS — R41841 Cognitive communication deficit: Secondary | ICD-10-CM | POA: Diagnosis not present

## 2023-07-20 DIAGNOSIS — R262 Difficulty in walking, not elsewhere classified: Secondary | ICD-10-CM | POA: Diagnosis not present

## 2023-07-20 DIAGNOSIS — M6281 Muscle weakness (generalized): Secondary | ICD-10-CM | POA: Diagnosis not present

## 2023-07-21 DIAGNOSIS — M6281 Muscle weakness (generalized): Secondary | ICD-10-CM | POA: Diagnosis not present

## 2023-07-21 DIAGNOSIS — R2681 Unsteadiness on feet: Secondary | ICD-10-CM | POA: Diagnosis not present

## 2023-07-21 DIAGNOSIS — R262 Difficulty in walking, not elsewhere classified: Secondary | ICD-10-CM | POA: Diagnosis not present

## 2023-07-21 DIAGNOSIS — R41841 Cognitive communication deficit: Secondary | ICD-10-CM | POA: Diagnosis not present

## 2023-07-22 DIAGNOSIS — M6281 Muscle weakness (generalized): Secondary | ICD-10-CM | POA: Diagnosis not present

## 2023-07-22 DIAGNOSIS — R2681 Unsteadiness on feet: Secondary | ICD-10-CM | POA: Diagnosis not present

## 2023-07-22 DIAGNOSIS — R262 Difficulty in walking, not elsewhere classified: Secondary | ICD-10-CM | POA: Diagnosis not present

## 2023-07-22 DIAGNOSIS — R41841 Cognitive communication deficit: Secondary | ICD-10-CM | POA: Diagnosis not present

## 2023-07-23 DIAGNOSIS — R41841 Cognitive communication deficit: Secondary | ICD-10-CM | POA: Diagnosis not present

## 2023-07-23 DIAGNOSIS — R2681 Unsteadiness on feet: Secondary | ICD-10-CM | POA: Diagnosis not present

## 2023-07-23 DIAGNOSIS — M6281 Muscle weakness (generalized): Secondary | ICD-10-CM | POA: Diagnosis not present

## 2023-07-23 DIAGNOSIS — R262 Difficulty in walking, not elsewhere classified: Secondary | ICD-10-CM | POA: Diagnosis not present

## 2023-07-24 DIAGNOSIS — M6281 Muscle weakness (generalized): Secondary | ICD-10-CM | POA: Diagnosis not present

## 2023-07-24 DIAGNOSIS — R41841 Cognitive communication deficit: Secondary | ICD-10-CM | POA: Diagnosis not present

## 2023-07-24 DIAGNOSIS — R2681 Unsteadiness on feet: Secondary | ICD-10-CM | POA: Diagnosis not present

## 2023-07-24 DIAGNOSIS — R262 Difficulty in walking, not elsewhere classified: Secondary | ICD-10-CM | POA: Diagnosis not present

## 2023-07-25 DIAGNOSIS — M6281 Muscle weakness (generalized): Secondary | ICD-10-CM | POA: Diagnosis not present

## 2023-07-25 DIAGNOSIS — R41841 Cognitive communication deficit: Secondary | ICD-10-CM | POA: Diagnosis not present

## 2023-07-25 DIAGNOSIS — R2681 Unsteadiness on feet: Secondary | ICD-10-CM | POA: Diagnosis not present

## 2023-07-25 DIAGNOSIS — R262 Difficulty in walking, not elsewhere classified: Secondary | ICD-10-CM | POA: Diagnosis not present

## 2023-07-27 DIAGNOSIS — R41841 Cognitive communication deficit: Secondary | ICD-10-CM | POA: Diagnosis not present

## 2023-07-27 DIAGNOSIS — R262 Difficulty in walking, not elsewhere classified: Secondary | ICD-10-CM | POA: Diagnosis not present

## 2023-07-27 DIAGNOSIS — R2681 Unsteadiness on feet: Secondary | ICD-10-CM | POA: Diagnosis not present

## 2023-07-27 DIAGNOSIS — M6281 Muscle weakness (generalized): Secondary | ICD-10-CM | POA: Diagnosis not present

## 2023-07-28 DIAGNOSIS — R2681 Unsteadiness on feet: Secondary | ICD-10-CM | POA: Diagnosis not present

## 2023-07-28 DIAGNOSIS — R41841 Cognitive communication deficit: Secondary | ICD-10-CM | POA: Diagnosis not present

## 2023-07-28 DIAGNOSIS — M6281 Muscle weakness (generalized): Secondary | ICD-10-CM | POA: Diagnosis not present

## 2023-07-28 DIAGNOSIS — R262 Difficulty in walking, not elsewhere classified: Secondary | ICD-10-CM | POA: Diagnosis not present

## 2023-07-29 DIAGNOSIS — M6281 Muscle weakness (generalized): Secondary | ICD-10-CM | POA: Diagnosis not present

## 2023-07-29 DIAGNOSIS — R262 Difficulty in walking, not elsewhere classified: Secondary | ICD-10-CM | POA: Diagnosis not present

## 2023-07-29 DIAGNOSIS — R41841 Cognitive communication deficit: Secondary | ICD-10-CM | POA: Diagnosis not present

## 2023-07-29 DIAGNOSIS — R2681 Unsteadiness on feet: Secondary | ICD-10-CM | POA: Diagnosis not present

## 2023-07-30 DIAGNOSIS — R2681 Unsteadiness on feet: Secondary | ICD-10-CM | POA: Diagnosis not present

## 2023-07-30 DIAGNOSIS — R262 Difficulty in walking, not elsewhere classified: Secondary | ICD-10-CM | POA: Diagnosis not present

## 2023-07-30 DIAGNOSIS — R41841 Cognitive communication deficit: Secondary | ICD-10-CM | POA: Diagnosis not present

## 2023-07-30 DIAGNOSIS — M6281 Muscle weakness (generalized): Secondary | ICD-10-CM | POA: Diagnosis not present

## 2023-07-31 DIAGNOSIS — M6281 Muscle weakness (generalized): Secondary | ICD-10-CM | POA: Diagnosis not present

## 2023-07-31 DIAGNOSIS — R262 Difficulty in walking, not elsewhere classified: Secondary | ICD-10-CM | POA: Diagnosis not present

## 2023-07-31 DIAGNOSIS — R41841 Cognitive communication deficit: Secondary | ICD-10-CM | POA: Diagnosis not present

## 2023-07-31 DIAGNOSIS — R2681 Unsteadiness on feet: Secondary | ICD-10-CM | POA: Diagnosis not present

## 2023-08-01 DIAGNOSIS — R262 Difficulty in walking, not elsewhere classified: Secondary | ICD-10-CM | POA: Diagnosis not present

## 2023-08-01 DIAGNOSIS — M6281 Muscle weakness (generalized): Secondary | ICD-10-CM | POA: Diagnosis not present

## 2023-08-01 DIAGNOSIS — R2681 Unsteadiness on feet: Secondary | ICD-10-CM | POA: Diagnosis not present

## 2023-08-01 DIAGNOSIS — R41841 Cognitive communication deficit: Secondary | ICD-10-CM | POA: Diagnosis not present

## 2023-08-03 DIAGNOSIS — R2681 Unsteadiness on feet: Secondary | ICD-10-CM | POA: Diagnosis not present

## 2023-08-03 DIAGNOSIS — M6281 Muscle weakness (generalized): Secondary | ICD-10-CM | POA: Diagnosis not present

## 2023-08-03 DIAGNOSIS — R262 Difficulty in walking, not elsewhere classified: Secondary | ICD-10-CM | POA: Diagnosis not present

## 2023-08-03 DIAGNOSIS — R41841 Cognitive communication deficit: Secondary | ICD-10-CM | POA: Diagnosis not present

## 2023-08-06 DIAGNOSIS — G894 Chronic pain syndrome: Secondary | ICD-10-CM | POA: Diagnosis not present

## 2023-08-06 DIAGNOSIS — F5101 Primary insomnia: Secondary | ICD-10-CM | POA: Diagnosis not present

## 2023-08-06 DIAGNOSIS — D509 Iron deficiency anemia, unspecified: Secondary | ICD-10-CM | POA: Diagnosis not present

## 2023-08-06 DIAGNOSIS — M6281 Muscle weakness (generalized): Secondary | ICD-10-CM | POA: Diagnosis not present

## 2023-08-06 DIAGNOSIS — R2681 Unsteadiness on feet: Secondary | ICD-10-CM | POA: Diagnosis not present

## 2023-08-06 DIAGNOSIS — F419 Anxiety disorder, unspecified: Secondary | ICD-10-CM | POA: Diagnosis not present

## 2023-08-06 DIAGNOSIS — F331 Major depressive disorder, recurrent, moderate: Secondary | ICD-10-CM | POA: Diagnosis not present

## 2023-08-06 DIAGNOSIS — M170 Bilateral primary osteoarthritis of knee: Secondary | ICD-10-CM | POA: Diagnosis not present

## 2023-08-07 DIAGNOSIS — G20A1 Parkinson's disease without dyskinesia, without mention of fluctuations: Secondary | ICD-10-CM | POA: Diagnosis not present

## 2023-08-07 DIAGNOSIS — D631 Anemia in chronic kidney disease: Secondary | ICD-10-CM | POA: Diagnosis not present

## 2023-08-07 DIAGNOSIS — N183 Chronic kidney disease, stage 3 unspecified: Secondary | ICD-10-CM | POA: Diagnosis not present

## 2023-08-07 DIAGNOSIS — D509 Iron deficiency anemia, unspecified: Secondary | ICD-10-CM | POA: Diagnosis not present

## 2023-08-07 DIAGNOSIS — K219 Gastro-esophageal reflux disease without esophagitis: Secondary | ICD-10-CM | POA: Diagnosis not present

## 2023-08-07 DIAGNOSIS — E785 Hyperlipidemia, unspecified: Secondary | ICD-10-CM | POA: Diagnosis not present

## 2023-08-07 DIAGNOSIS — R2681 Unsteadiness on feet: Secondary | ICD-10-CM | POA: Diagnosis not present

## 2023-08-07 DIAGNOSIS — G629 Polyneuropathy, unspecified: Secondary | ICD-10-CM | POA: Diagnosis not present

## 2023-08-07 DIAGNOSIS — G894 Chronic pain syndrome: Secondary | ICD-10-CM | POA: Diagnosis not present

## 2023-08-07 DIAGNOSIS — N170 Acute kidney failure with tubular necrosis: Secondary | ICD-10-CM | POA: Diagnosis not present

## 2023-08-07 DIAGNOSIS — M6281 Muscle weakness (generalized): Secondary | ICD-10-CM | POA: Diagnosis not present

## 2023-08-07 DIAGNOSIS — M170 Bilateral primary osteoarthritis of knee: Secondary | ICD-10-CM | POA: Diagnosis not present

## 2023-08-10 DIAGNOSIS — E785 Hyperlipidemia, unspecified: Secondary | ICD-10-CM | POA: Diagnosis not present

## 2023-08-10 DIAGNOSIS — G20A1 Parkinson's disease without dyskinesia, without mention of fluctuations: Secondary | ICD-10-CM | POA: Diagnosis not present

## 2023-08-10 DIAGNOSIS — D631 Anemia in chronic kidney disease: Secondary | ICD-10-CM | POA: Diagnosis not present

## 2023-08-10 DIAGNOSIS — K219 Gastro-esophageal reflux disease without esophagitis: Secondary | ICD-10-CM | POA: Diagnosis not present

## 2023-08-10 DIAGNOSIS — D509 Iron deficiency anemia, unspecified: Secondary | ICD-10-CM | POA: Diagnosis not present

## 2023-08-10 DIAGNOSIS — N170 Acute kidney failure with tubular necrosis: Secondary | ICD-10-CM | POA: Diagnosis not present

## 2023-08-10 DIAGNOSIS — N183 Chronic kidney disease, stage 3 unspecified: Secondary | ICD-10-CM | POA: Diagnosis not present

## 2023-08-10 DIAGNOSIS — G629 Polyneuropathy, unspecified: Secondary | ICD-10-CM | POA: Diagnosis not present

## 2023-08-10 DIAGNOSIS — M170 Bilateral primary osteoarthritis of knee: Secondary | ICD-10-CM | POA: Diagnosis not present

## 2023-08-11 DIAGNOSIS — Z79899 Other long term (current) drug therapy: Secondary | ICD-10-CM | POA: Diagnosis not present

## 2023-08-11 DIAGNOSIS — D519 Vitamin B12 deficiency anemia, unspecified: Secondary | ICD-10-CM | POA: Diagnosis not present

## 2023-08-11 DIAGNOSIS — F331 Major depressive disorder, recurrent, moderate: Secondary | ICD-10-CM | POA: Diagnosis not present

## 2023-08-11 DIAGNOSIS — D509 Iron deficiency anemia, unspecified: Secondary | ICD-10-CM | POA: Diagnosis not present

## 2023-08-11 DIAGNOSIS — E559 Vitamin D deficiency, unspecified: Secondary | ICD-10-CM | POA: Diagnosis not present

## 2023-08-14 DIAGNOSIS — G629 Polyneuropathy, unspecified: Secondary | ICD-10-CM | POA: Diagnosis not present

## 2023-08-14 DIAGNOSIS — N183 Chronic kidney disease, stage 3 unspecified: Secondary | ICD-10-CM | POA: Diagnosis not present

## 2023-08-14 DIAGNOSIS — M170 Bilateral primary osteoarthritis of knee: Secondary | ICD-10-CM | POA: Diagnosis not present

## 2023-08-14 DIAGNOSIS — D509 Iron deficiency anemia, unspecified: Secondary | ICD-10-CM | POA: Diagnosis not present

## 2023-08-14 DIAGNOSIS — E785 Hyperlipidemia, unspecified: Secondary | ICD-10-CM | POA: Diagnosis not present

## 2023-08-14 DIAGNOSIS — K219 Gastro-esophageal reflux disease without esophagitis: Secondary | ICD-10-CM | POA: Diagnosis not present

## 2023-08-14 DIAGNOSIS — N170 Acute kidney failure with tubular necrosis: Secondary | ICD-10-CM | POA: Diagnosis not present

## 2023-08-14 DIAGNOSIS — D631 Anemia in chronic kidney disease: Secondary | ICD-10-CM | POA: Diagnosis not present

## 2023-08-14 DIAGNOSIS — G20A1 Parkinson's disease without dyskinesia, without mention of fluctuations: Secondary | ICD-10-CM | POA: Diagnosis not present

## 2023-08-18 DIAGNOSIS — N183 Chronic kidney disease, stage 3 unspecified: Secondary | ICD-10-CM | POA: Diagnosis not present

## 2023-08-18 DIAGNOSIS — G894 Chronic pain syndrome: Secondary | ICD-10-CM | POA: Diagnosis not present

## 2023-08-18 DIAGNOSIS — D509 Iron deficiency anemia, unspecified: Secondary | ICD-10-CM | POA: Diagnosis not present

## 2023-08-18 DIAGNOSIS — N170 Acute kidney failure with tubular necrosis: Secondary | ICD-10-CM | POA: Diagnosis not present

## 2023-08-18 DIAGNOSIS — M170 Bilateral primary osteoarthritis of knee: Secondary | ICD-10-CM | POA: Diagnosis not present

## 2023-08-18 DIAGNOSIS — F419 Anxiety disorder, unspecified: Secondary | ICD-10-CM | POA: Diagnosis not present

## 2023-08-18 DIAGNOSIS — F331 Major depressive disorder, recurrent, moderate: Secondary | ICD-10-CM | POA: Diagnosis not present

## 2023-08-18 DIAGNOSIS — D631 Anemia in chronic kidney disease: Secondary | ICD-10-CM | POA: Diagnosis not present

## 2023-08-18 DIAGNOSIS — G20A1 Parkinson's disease without dyskinesia, without mention of fluctuations: Secondary | ICD-10-CM | POA: Diagnosis not present

## 2023-08-18 DIAGNOSIS — F5101 Primary insomnia: Secondary | ICD-10-CM | POA: Diagnosis not present

## 2023-08-18 DIAGNOSIS — K219 Gastro-esophageal reflux disease without esophagitis: Secondary | ICD-10-CM | POA: Diagnosis not present

## 2023-08-18 DIAGNOSIS — E785 Hyperlipidemia, unspecified: Secondary | ICD-10-CM | POA: Diagnosis not present

## 2023-08-18 DIAGNOSIS — G629 Polyneuropathy, unspecified: Secondary | ICD-10-CM | POA: Diagnosis not present

## 2023-08-20 DIAGNOSIS — E785 Hyperlipidemia, unspecified: Secondary | ICD-10-CM | POA: Diagnosis not present

## 2023-08-20 DIAGNOSIS — D509 Iron deficiency anemia, unspecified: Secondary | ICD-10-CM | POA: Diagnosis not present

## 2023-08-20 DIAGNOSIS — D631 Anemia in chronic kidney disease: Secondary | ICD-10-CM | POA: Diagnosis not present

## 2023-08-20 DIAGNOSIS — N183 Chronic kidney disease, stage 3 unspecified: Secondary | ICD-10-CM | POA: Diagnosis not present

## 2023-08-20 DIAGNOSIS — K219 Gastro-esophageal reflux disease without esophagitis: Secondary | ICD-10-CM | POA: Diagnosis not present

## 2023-08-20 DIAGNOSIS — G629 Polyneuropathy, unspecified: Secondary | ICD-10-CM | POA: Diagnosis not present

## 2023-08-20 DIAGNOSIS — G20A1 Parkinson's disease without dyskinesia, without mention of fluctuations: Secondary | ICD-10-CM | POA: Diagnosis not present

## 2023-08-20 DIAGNOSIS — M170 Bilateral primary osteoarthritis of knee: Secondary | ICD-10-CM | POA: Diagnosis not present

## 2023-08-20 DIAGNOSIS — N170 Acute kidney failure with tubular necrosis: Secondary | ICD-10-CM | POA: Diagnosis not present

## 2023-08-25 DIAGNOSIS — F419 Anxiety disorder, unspecified: Secondary | ICD-10-CM | POA: Diagnosis not present

## 2023-08-25 DIAGNOSIS — D509 Iron deficiency anemia, unspecified: Secondary | ICD-10-CM | POA: Diagnosis not present

## 2023-08-25 DIAGNOSIS — G20A1 Parkinson's disease without dyskinesia, without mention of fluctuations: Secondary | ICD-10-CM | POA: Diagnosis not present

## 2023-08-25 DIAGNOSIS — L89321 Pressure ulcer of left buttock, stage 1: Secondary | ICD-10-CM | POA: Diagnosis not present

## 2023-08-25 DIAGNOSIS — G629 Polyneuropathy, unspecified: Secondary | ICD-10-CM | POA: Diagnosis not present

## 2023-08-25 DIAGNOSIS — K219 Gastro-esophageal reflux disease without esophagitis: Secondary | ICD-10-CM | POA: Diagnosis not present

## 2023-08-25 DIAGNOSIS — F5101 Primary insomnia: Secondary | ICD-10-CM | POA: Diagnosis not present

## 2023-08-25 DIAGNOSIS — N183 Chronic kidney disease, stage 3 unspecified: Secondary | ICD-10-CM | POA: Diagnosis not present

## 2023-08-25 DIAGNOSIS — M170 Bilateral primary osteoarthritis of knee: Secondary | ICD-10-CM | POA: Diagnosis not present

## 2023-08-25 DIAGNOSIS — F331 Major depressive disorder, recurrent, moderate: Secondary | ICD-10-CM | POA: Diagnosis not present

## 2023-08-25 DIAGNOSIS — N170 Acute kidney failure with tubular necrosis: Secondary | ICD-10-CM | POA: Diagnosis not present

## 2023-08-25 DIAGNOSIS — G894 Chronic pain syndrome: Secondary | ICD-10-CM | POA: Diagnosis not present

## 2023-08-25 DIAGNOSIS — D631 Anemia in chronic kidney disease: Secondary | ICD-10-CM | POA: Diagnosis not present

## 2023-08-25 DIAGNOSIS — E785 Hyperlipidemia, unspecified: Secondary | ICD-10-CM | POA: Diagnosis not present

## 2023-08-26 DIAGNOSIS — R350 Frequency of micturition: Secondary | ICD-10-CM | POA: Diagnosis not present

## 2023-08-26 DIAGNOSIS — R300 Dysuria: Secondary | ICD-10-CM | POA: Diagnosis not present

## 2023-08-27 DIAGNOSIS — N183 Chronic kidney disease, stage 3 unspecified: Secondary | ICD-10-CM | POA: Diagnosis not present

## 2023-08-27 DIAGNOSIS — G20A1 Parkinson's disease without dyskinesia, without mention of fluctuations: Secondary | ICD-10-CM | POA: Diagnosis not present

## 2023-08-27 DIAGNOSIS — E785 Hyperlipidemia, unspecified: Secondary | ICD-10-CM | POA: Diagnosis not present

## 2023-08-27 DIAGNOSIS — K219 Gastro-esophageal reflux disease without esophagitis: Secondary | ICD-10-CM | POA: Diagnosis not present

## 2023-08-27 DIAGNOSIS — M170 Bilateral primary osteoarthritis of knee: Secondary | ICD-10-CM | POA: Diagnosis not present

## 2023-08-27 DIAGNOSIS — G629 Polyneuropathy, unspecified: Secondary | ICD-10-CM | POA: Diagnosis not present

## 2023-08-27 DIAGNOSIS — D631 Anemia in chronic kidney disease: Secondary | ICD-10-CM | POA: Diagnosis not present

## 2023-08-27 DIAGNOSIS — D509 Iron deficiency anemia, unspecified: Secondary | ICD-10-CM | POA: Diagnosis not present

## 2023-08-27 DIAGNOSIS — N170 Acute kidney failure with tubular necrosis: Secondary | ICD-10-CM | POA: Diagnosis not present

## 2023-09-01 DIAGNOSIS — G894 Chronic pain syndrome: Secondary | ICD-10-CM | POA: Diagnosis not present

## 2023-09-01 DIAGNOSIS — G20A1 Parkinson's disease without dyskinesia, without mention of fluctuations: Secondary | ICD-10-CM | POA: Diagnosis not present

## 2023-09-01 DIAGNOSIS — N183 Chronic kidney disease, stage 3 unspecified: Secondary | ICD-10-CM | POA: Diagnosis not present

## 2023-09-01 DIAGNOSIS — K219 Gastro-esophageal reflux disease without esophagitis: Secondary | ICD-10-CM | POA: Diagnosis not present

## 2023-09-01 DIAGNOSIS — L89321 Pressure ulcer of left buttock, stage 1: Secondary | ICD-10-CM | POA: Diagnosis not present

## 2023-09-01 DIAGNOSIS — E785 Hyperlipidemia, unspecified: Secondary | ICD-10-CM | POA: Diagnosis not present

## 2023-09-01 DIAGNOSIS — F419 Anxiety disorder, unspecified: Secondary | ICD-10-CM | POA: Diagnosis not present

## 2023-09-01 DIAGNOSIS — N170 Acute kidney failure with tubular necrosis: Secondary | ICD-10-CM | POA: Diagnosis not present

## 2023-09-01 DIAGNOSIS — N390 Urinary tract infection, site not specified: Secondary | ICD-10-CM | POA: Diagnosis not present

## 2023-09-01 DIAGNOSIS — M170 Bilateral primary osteoarthritis of knee: Secondary | ICD-10-CM | POA: Diagnosis not present

## 2023-09-01 DIAGNOSIS — D509 Iron deficiency anemia, unspecified: Secondary | ICD-10-CM | POA: Diagnosis not present

## 2023-09-01 DIAGNOSIS — F5101 Primary insomnia: Secondary | ICD-10-CM | POA: Diagnosis not present

## 2023-09-01 DIAGNOSIS — F331 Major depressive disorder, recurrent, moderate: Secondary | ICD-10-CM | POA: Diagnosis not present

## 2023-09-01 DIAGNOSIS — G629 Polyneuropathy, unspecified: Secondary | ICD-10-CM | POA: Diagnosis not present

## 2023-09-01 DIAGNOSIS — D631 Anemia in chronic kidney disease: Secondary | ICD-10-CM | POA: Diagnosis not present

## 2023-09-03 DIAGNOSIS — G629 Polyneuropathy, unspecified: Secondary | ICD-10-CM | POA: Diagnosis not present

## 2023-09-03 DIAGNOSIS — R2681 Unsteadiness on feet: Secondary | ICD-10-CM | POA: Diagnosis not present

## 2023-09-03 DIAGNOSIS — K219 Gastro-esophageal reflux disease without esophagitis: Secondary | ICD-10-CM | POA: Diagnosis not present

## 2023-09-03 DIAGNOSIS — G20A1 Parkinson's disease without dyskinesia, without mention of fluctuations: Secondary | ICD-10-CM | POA: Diagnosis not present

## 2023-09-03 DIAGNOSIS — D509 Iron deficiency anemia, unspecified: Secondary | ICD-10-CM | POA: Diagnosis not present

## 2023-09-03 DIAGNOSIS — M6281 Muscle weakness (generalized): Secondary | ICD-10-CM | POA: Diagnosis not present

## 2023-09-03 DIAGNOSIS — D631 Anemia in chronic kidney disease: Secondary | ICD-10-CM | POA: Diagnosis not present

## 2023-09-03 DIAGNOSIS — N170 Acute kidney failure with tubular necrosis: Secondary | ICD-10-CM | POA: Diagnosis not present

## 2023-09-03 DIAGNOSIS — M170 Bilateral primary osteoarthritis of knee: Secondary | ICD-10-CM | POA: Diagnosis not present

## 2023-09-03 DIAGNOSIS — N183 Chronic kidney disease, stage 3 unspecified: Secondary | ICD-10-CM | POA: Diagnosis not present

## 2023-09-03 DIAGNOSIS — E785 Hyperlipidemia, unspecified: Secondary | ICD-10-CM | POA: Diagnosis not present

## 2023-09-08 DIAGNOSIS — K219 Gastro-esophageal reflux disease without esophagitis: Secondary | ICD-10-CM | POA: Diagnosis not present

## 2023-09-08 DIAGNOSIS — N170 Acute kidney failure with tubular necrosis: Secondary | ICD-10-CM | POA: Diagnosis not present

## 2023-09-08 DIAGNOSIS — Z79899 Other long term (current) drug therapy: Secondary | ICD-10-CM | POA: Diagnosis not present

## 2023-09-08 DIAGNOSIS — D631 Anemia in chronic kidney disease: Secondary | ICD-10-CM | POA: Diagnosis not present

## 2023-09-08 DIAGNOSIS — N183 Chronic kidney disease, stage 3 unspecified: Secondary | ICD-10-CM | POA: Diagnosis not present

## 2023-09-08 DIAGNOSIS — G20A1 Parkinson's disease without dyskinesia, without mention of fluctuations: Secondary | ICD-10-CM | POA: Diagnosis not present

## 2023-09-08 DIAGNOSIS — M170 Bilateral primary osteoarthritis of knee: Secondary | ICD-10-CM | POA: Diagnosis not present

## 2023-09-08 DIAGNOSIS — D509 Iron deficiency anemia, unspecified: Secondary | ICD-10-CM | POA: Diagnosis not present

## 2023-09-08 DIAGNOSIS — E785 Hyperlipidemia, unspecified: Secondary | ICD-10-CM | POA: Diagnosis not present

## 2023-09-08 DIAGNOSIS — R350 Frequency of micturition: Secondary | ICD-10-CM | POA: Diagnosis not present

## 2023-09-08 DIAGNOSIS — G629 Polyneuropathy, unspecified: Secondary | ICD-10-CM | POA: Diagnosis not present

## 2023-09-09 DIAGNOSIS — D631 Anemia in chronic kidney disease: Secondary | ICD-10-CM | POA: Diagnosis not present

## 2023-09-09 DIAGNOSIS — K219 Gastro-esophageal reflux disease without esophagitis: Secondary | ICD-10-CM | POA: Diagnosis not present

## 2023-09-09 DIAGNOSIS — D509 Iron deficiency anemia, unspecified: Secondary | ICD-10-CM | POA: Diagnosis not present

## 2023-09-09 DIAGNOSIS — G20A1 Parkinson's disease without dyskinesia, without mention of fluctuations: Secondary | ICD-10-CM | POA: Diagnosis not present

## 2023-09-09 DIAGNOSIS — E785 Hyperlipidemia, unspecified: Secondary | ICD-10-CM | POA: Diagnosis not present

## 2023-09-09 DIAGNOSIS — N170 Acute kidney failure with tubular necrosis: Secondary | ICD-10-CM | POA: Diagnosis not present

## 2023-09-09 DIAGNOSIS — N183 Chronic kidney disease, stage 3 unspecified: Secondary | ICD-10-CM | POA: Diagnosis not present

## 2023-09-09 DIAGNOSIS — M170 Bilateral primary osteoarthritis of knee: Secondary | ICD-10-CM | POA: Diagnosis not present

## 2023-09-09 DIAGNOSIS — G629 Polyneuropathy, unspecified: Secondary | ICD-10-CM | POA: Diagnosis not present

## 2023-09-11 DIAGNOSIS — E875 Hyperkalemia: Secondary | ICD-10-CM | POA: Diagnosis not present

## 2023-09-15 DIAGNOSIS — E785 Hyperlipidemia, unspecified: Secondary | ICD-10-CM | POA: Diagnosis not present

## 2023-09-15 DIAGNOSIS — N170 Acute kidney failure with tubular necrosis: Secondary | ICD-10-CM | POA: Diagnosis not present

## 2023-09-15 DIAGNOSIS — K219 Gastro-esophageal reflux disease without esophagitis: Secondary | ICD-10-CM | POA: Diagnosis not present

## 2023-09-15 DIAGNOSIS — F5101 Primary insomnia: Secondary | ICD-10-CM | POA: Diagnosis not present

## 2023-09-15 DIAGNOSIS — F419 Anxiety disorder, unspecified: Secondary | ICD-10-CM | POA: Diagnosis not present

## 2023-09-15 DIAGNOSIS — M170 Bilateral primary osteoarthritis of knee: Secondary | ICD-10-CM | POA: Diagnosis not present

## 2023-09-15 DIAGNOSIS — G629 Polyneuropathy, unspecified: Secondary | ICD-10-CM | POA: Diagnosis not present

## 2023-09-15 DIAGNOSIS — G20A1 Parkinson's disease without dyskinesia, without mention of fluctuations: Secondary | ICD-10-CM | POA: Diagnosis not present

## 2023-09-15 DIAGNOSIS — N183 Chronic kidney disease, stage 3 unspecified: Secondary | ICD-10-CM | POA: Diagnosis not present

## 2023-09-15 DIAGNOSIS — D509 Iron deficiency anemia, unspecified: Secondary | ICD-10-CM | POA: Diagnosis not present

## 2023-09-15 DIAGNOSIS — D631 Anemia in chronic kidney disease: Secondary | ICD-10-CM | POA: Diagnosis not present

## 2023-09-15 DIAGNOSIS — E875 Hyperkalemia: Secondary | ICD-10-CM | POA: Diagnosis not present

## 2023-09-15 DIAGNOSIS — G894 Chronic pain syndrome: Secondary | ICD-10-CM | POA: Diagnosis not present

## 2023-09-15 DIAGNOSIS — F331 Major depressive disorder, recurrent, moderate: Secondary | ICD-10-CM | POA: Diagnosis not present

## 2023-09-16 DIAGNOSIS — K219 Gastro-esophageal reflux disease without esophagitis: Secondary | ICD-10-CM | POA: Diagnosis not present

## 2023-09-16 DIAGNOSIS — E785 Hyperlipidemia, unspecified: Secondary | ICD-10-CM | POA: Diagnosis not present

## 2023-09-16 DIAGNOSIS — G20A1 Parkinson's disease without dyskinesia, without mention of fluctuations: Secondary | ICD-10-CM | POA: Diagnosis not present

## 2023-09-16 DIAGNOSIS — N183 Chronic kidney disease, stage 3 unspecified: Secondary | ICD-10-CM | POA: Diagnosis not present

## 2023-09-16 DIAGNOSIS — D631 Anemia in chronic kidney disease: Secondary | ICD-10-CM | POA: Diagnosis not present

## 2023-09-16 DIAGNOSIS — M170 Bilateral primary osteoarthritis of knee: Secondary | ICD-10-CM | POA: Diagnosis not present

## 2023-09-16 DIAGNOSIS — G629 Polyneuropathy, unspecified: Secondary | ICD-10-CM | POA: Diagnosis not present

## 2023-09-16 DIAGNOSIS — N170 Acute kidney failure with tubular necrosis: Secondary | ICD-10-CM | POA: Diagnosis not present

## 2023-09-16 DIAGNOSIS — D509 Iron deficiency anemia, unspecified: Secondary | ICD-10-CM | POA: Diagnosis not present

## 2023-09-17 DIAGNOSIS — F4323 Adjustment disorder with mixed anxiety and depressed mood: Secondary | ICD-10-CM | POA: Diagnosis not present

## 2023-09-21 DIAGNOSIS — G894 Chronic pain syndrome: Secondary | ICD-10-CM | POA: Diagnosis not present

## 2023-09-21 DIAGNOSIS — F331 Major depressive disorder, recurrent, moderate: Secondary | ICD-10-CM | POA: Diagnosis not present

## 2023-09-21 DIAGNOSIS — F5101 Primary insomnia: Secondary | ICD-10-CM | POA: Diagnosis not present

## 2023-09-22 DIAGNOSIS — G20A1 Parkinson's disease without dyskinesia, without mention of fluctuations: Secondary | ICD-10-CM | POA: Diagnosis not present

## 2023-09-22 DIAGNOSIS — M170 Bilateral primary osteoarthritis of knee: Secondary | ICD-10-CM | POA: Diagnosis not present

## 2023-09-22 DIAGNOSIS — E785 Hyperlipidemia, unspecified: Secondary | ICD-10-CM | POA: Diagnosis not present

## 2023-09-22 DIAGNOSIS — N170 Acute kidney failure with tubular necrosis: Secondary | ICD-10-CM | POA: Diagnosis not present

## 2023-09-22 DIAGNOSIS — D509 Iron deficiency anemia, unspecified: Secondary | ICD-10-CM | POA: Diagnosis not present

## 2023-09-22 DIAGNOSIS — K219 Gastro-esophageal reflux disease without esophagitis: Secondary | ICD-10-CM | POA: Diagnosis not present

## 2023-09-22 DIAGNOSIS — D631 Anemia in chronic kidney disease: Secondary | ICD-10-CM | POA: Diagnosis not present

## 2023-09-22 DIAGNOSIS — G629 Polyneuropathy, unspecified: Secondary | ICD-10-CM | POA: Diagnosis not present

## 2023-09-22 DIAGNOSIS — N183 Chronic kidney disease, stage 3 unspecified: Secondary | ICD-10-CM | POA: Diagnosis not present

## 2023-09-24 DIAGNOSIS — N170 Acute kidney failure with tubular necrosis: Secondary | ICD-10-CM | POA: Diagnosis not present

## 2023-09-24 DIAGNOSIS — G20A1 Parkinson's disease without dyskinesia, without mention of fluctuations: Secondary | ICD-10-CM | POA: Diagnosis not present

## 2023-09-24 DIAGNOSIS — K219 Gastro-esophageal reflux disease without esophagitis: Secondary | ICD-10-CM | POA: Diagnosis not present

## 2023-09-24 DIAGNOSIS — N183 Chronic kidney disease, stage 3 unspecified: Secondary | ICD-10-CM | POA: Diagnosis not present

## 2023-09-24 DIAGNOSIS — G629 Polyneuropathy, unspecified: Secondary | ICD-10-CM | POA: Diagnosis not present

## 2023-09-24 DIAGNOSIS — E785 Hyperlipidemia, unspecified: Secondary | ICD-10-CM | POA: Diagnosis not present

## 2023-09-24 DIAGNOSIS — D631 Anemia in chronic kidney disease: Secondary | ICD-10-CM | POA: Diagnosis not present

## 2023-09-24 DIAGNOSIS — M170 Bilateral primary osteoarthritis of knee: Secondary | ICD-10-CM | POA: Diagnosis not present

## 2023-09-24 DIAGNOSIS — D509 Iron deficiency anemia, unspecified: Secondary | ICD-10-CM | POA: Diagnosis not present

## 2023-10-01 DIAGNOSIS — D631 Anemia in chronic kidney disease: Secondary | ICD-10-CM | POA: Diagnosis not present

## 2023-10-01 DIAGNOSIS — N170 Acute kidney failure with tubular necrosis: Secondary | ICD-10-CM | POA: Diagnosis not present

## 2023-10-01 DIAGNOSIS — E785 Hyperlipidemia, unspecified: Secondary | ICD-10-CM | POA: Diagnosis not present

## 2023-10-01 DIAGNOSIS — K219 Gastro-esophageal reflux disease without esophagitis: Secondary | ICD-10-CM | POA: Diagnosis not present

## 2023-10-01 DIAGNOSIS — M170 Bilateral primary osteoarthritis of knee: Secondary | ICD-10-CM | POA: Diagnosis not present

## 2023-10-01 DIAGNOSIS — G629 Polyneuropathy, unspecified: Secondary | ICD-10-CM | POA: Diagnosis not present

## 2023-10-01 DIAGNOSIS — G20A1 Parkinson's disease without dyskinesia, without mention of fluctuations: Secondary | ICD-10-CM | POA: Diagnosis not present

## 2023-10-01 DIAGNOSIS — N183 Chronic kidney disease, stage 3 unspecified: Secondary | ICD-10-CM | POA: Diagnosis not present

## 2023-10-01 DIAGNOSIS — D509 Iron deficiency anemia, unspecified: Secondary | ICD-10-CM | POA: Diagnosis not present

## 2023-10-02 DIAGNOSIS — E785 Hyperlipidemia, unspecified: Secondary | ICD-10-CM | POA: Diagnosis not present

## 2023-10-02 DIAGNOSIS — N170 Acute kidney failure with tubular necrosis: Secondary | ICD-10-CM | POA: Diagnosis not present

## 2023-10-02 DIAGNOSIS — G629 Polyneuropathy, unspecified: Secondary | ICD-10-CM | POA: Diagnosis not present

## 2023-10-02 DIAGNOSIS — M170 Bilateral primary osteoarthritis of knee: Secondary | ICD-10-CM | POA: Diagnosis not present

## 2023-10-02 DIAGNOSIS — N183 Chronic kidney disease, stage 3 unspecified: Secondary | ICD-10-CM | POA: Diagnosis not present

## 2023-10-02 DIAGNOSIS — D509 Iron deficiency anemia, unspecified: Secondary | ICD-10-CM | POA: Diagnosis not present

## 2023-10-02 DIAGNOSIS — D631 Anemia in chronic kidney disease: Secondary | ICD-10-CM | POA: Diagnosis not present

## 2023-10-02 DIAGNOSIS — G20A1 Parkinson's disease without dyskinesia, without mention of fluctuations: Secondary | ICD-10-CM | POA: Diagnosis not present

## 2023-10-02 DIAGNOSIS — K219 Gastro-esophageal reflux disease without esophagitis: Secondary | ICD-10-CM | POA: Diagnosis not present

## 2023-10-06 DIAGNOSIS — M170 Bilateral primary osteoarthritis of knee: Secondary | ICD-10-CM | POA: Diagnosis not present

## 2023-10-06 DIAGNOSIS — D631 Anemia in chronic kidney disease: Secondary | ICD-10-CM | POA: Diagnosis not present

## 2023-10-06 DIAGNOSIS — D509 Iron deficiency anemia, unspecified: Secondary | ICD-10-CM | POA: Diagnosis not present

## 2023-10-06 DIAGNOSIS — M810 Age-related osteoporosis without current pathological fracture: Secondary | ICD-10-CM | POA: Diagnosis not present

## 2023-10-06 DIAGNOSIS — G629 Polyneuropathy, unspecified: Secondary | ICD-10-CM | POA: Diagnosis not present

## 2023-10-06 DIAGNOSIS — E785 Hyperlipidemia, unspecified: Secondary | ICD-10-CM | POA: Diagnosis not present

## 2023-10-06 DIAGNOSIS — K219 Gastro-esophageal reflux disease without esophagitis: Secondary | ICD-10-CM | POA: Diagnosis not present

## 2023-10-06 DIAGNOSIS — N183 Chronic kidney disease, stage 3 unspecified: Secondary | ICD-10-CM | POA: Diagnosis not present

## 2023-10-06 DIAGNOSIS — K589 Irritable bowel syndrome without diarrhea: Secondary | ICD-10-CM | POA: Diagnosis not present

## 2023-10-09 DIAGNOSIS — M170 Bilateral primary osteoarthritis of knee: Secondary | ICD-10-CM | POA: Diagnosis not present

## 2023-10-09 DIAGNOSIS — F5101 Primary insomnia: Secondary | ICD-10-CM | POA: Diagnosis not present

## 2023-10-09 DIAGNOSIS — G894 Chronic pain syndrome: Secondary | ICD-10-CM | POA: Diagnosis not present

## 2023-10-09 DIAGNOSIS — F331 Major depressive disorder, recurrent, moderate: Secondary | ICD-10-CM | POA: Diagnosis not present

## 2023-10-12 DIAGNOSIS — M170 Bilateral primary osteoarthritis of knee: Secondary | ICD-10-CM | POA: Diagnosis not present

## 2023-10-12 DIAGNOSIS — D509 Iron deficiency anemia, unspecified: Secondary | ICD-10-CM | POA: Diagnosis not present

## 2023-10-12 DIAGNOSIS — N183 Chronic kidney disease, stage 3 unspecified: Secondary | ICD-10-CM | POA: Diagnosis not present

## 2023-10-12 DIAGNOSIS — M810 Age-related osteoporosis without current pathological fracture: Secondary | ICD-10-CM | POA: Diagnosis not present

## 2023-10-12 DIAGNOSIS — K589 Irritable bowel syndrome without diarrhea: Secondary | ICD-10-CM | POA: Diagnosis not present

## 2023-10-12 DIAGNOSIS — D631 Anemia in chronic kidney disease: Secondary | ICD-10-CM | POA: Diagnosis not present

## 2023-10-12 DIAGNOSIS — K219 Gastro-esophageal reflux disease without esophagitis: Secondary | ICD-10-CM | POA: Diagnosis not present

## 2023-10-12 DIAGNOSIS — M6281 Muscle weakness (generalized): Secondary | ICD-10-CM | POA: Diagnosis not present

## 2023-10-12 DIAGNOSIS — E785 Hyperlipidemia, unspecified: Secondary | ICD-10-CM | POA: Diagnosis not present

## 2023-10-12 DIAGNOSIS — R2681 Unsteadiness on feet: Secondary | ICD-10-CM | POA: Diagnosis not present

## 2023-10-12 DIAGNOSIS — G629 Polyneuropathy, unspecified: Secondary | ICD-10-CM | POA: Diagnosis not present

## 2023-10-13 DIAGNOSIS — D509 Iron deficiency anemia, unspecified: Secondary | ICD-10-CM | POA: Diagnosis not present

## 2023-10-13 DIAGNOSIS — G894 Chronic pain syndrome: Secondary | ICD-10-CM | POA: Diagnosis not present

## 2023-10-13 DIAGNOSIS — F419 Anxiety disorder, unspecified: Secondary | ICD-10-CM | POA: Diagnosis not present

## 2023-10-13 DIAGNOSIS — F331 Major depressive disorder, recurrent, moderate: Secondary | ICD-10-CM | POA: Diagnosis not present

## 2023-10-13 DIAGNOSIS — F5101 Primary insomnia: Secondary | ICD-10-CM | POA: Diagnosis not present

## 2023-10-13 DIAGNOSIS — M5442 Lumbago with sciatica, left side: Secondary | ICD-10-CM | POA: Diagnosis not present

## 2023-10-20 DIAGNOSIS — D509 Iron deficiency anemia, unspecified: Secondary | ICD-10-CM | POA: Diagnosis not present

## 2023-10-20 DIAGNOSIS — F5101 Primary insomnia: Secondary | ICD-10-CM | POA: Diagnosis not present

## 2023-10-20 DIAGNOSIS — G894 Chronic pain syndrome: Secondary | ICD-10-CM | POA: Diagnosis not present

## 2023-10-20 DIAGNOSIS — M5442 Lumbago with sciatica, left side: Secondary | ICD-10-CM | POA: Diagnosis not present

## 2023-10-20 DIAGNOSIS — F331 Major depressive disorder, recurrent, moderate: Secondary | ICD-10-CM | POA: Diagnosis not present

## 2023-10-20 DIAGNOSIS — F419 Anxiety disorder, unspecified: Secondary | ICD-10-CM | POA: Diagnosis not present

## 2023-10-22 DIAGNOSIS — M5442 Lumbago with sciatica, left side: Secondary | ICD-10-CM | POA: Diagnosis not present

## 2023-10-22 DIAGNOSIS — M810 Age-related osteoporosis without current pathological fracture: Secondary | ICD-10-CM | POA: Diagnosis not present

## 2023-10-22 DIAGNOSIS — G894 Chronic pain syndrome: Secondary | ICD-10-CM | POA: Diagnosis not present

## 2023-10-22 DIAGNOSIS — D509 Iron deficiency anemia, unspecified: Secondary | ICD-10-CM | POA: Diagnosis not present

## 2023-10-23 DIAGNOSIS — M25559 Pain in unspecified hip: Secondary | ICD-10-CM | POA: Diagnosis not present

## 2023-11-02 DIAGNOSIS — R601 Generalized edema: Secondary | ICD-10-CM | POA: Diagnosis not present

## 2023-11-02 DIAGNOSIS — I872 Venous insufficiency (chronic) (peripheral): Secondary | ICD-10-CM | POA: Diagnosis not present

## 2023-11-02 DIAGNOSIS — M6281 Muscle weakness (generalized): Secondary | ICD-10-CM | POA: Diagnosis not present

## 2023-11-02 DIAGNOSIS — R2689 Other abnormalities of gait and mobility: Secondary | ICD-10-CM | POA: Diagnosis not present

## 2023-11-02 DIAGNOSIS — R238 Other skin changes: Secondary | ICD-10-CM | POA: Diagnosis not present

## 2023-11-02 DIAGNOSIS — R262 Difficulty in walking, not elsewhere classified: Secondary | ICD-10-CM | POA: Diagnosis not present

## 2023-11-02 DIAGNOSIS — B351 Tinea unguium: Secondary | ICD-10-CM | POA: Diagnosis not present

## 2023-11-03 DIAGNOSIS — F419 Anxiety disorder, unspecified: Secondary | ICD-10-CM | POA: Diagnosis not present

## 2023-11-03 DIAGNOSIS — M170 Bilateral primary osteoarthritis of knee: Secondary | ICD-10-CM | POA: Diagnosis not present

## 2023-11-03 DIAGNOSIS — G894 Chronic pain syndrome: Secondary | ICD-10-CM | POA: Diagnosis not present

## 2023-11-03 DIAGNOSIS — R2681 Unsteadiness on feet: Secondary | ICD-10-CM | POA: Diagnosis not present

## 2023-11-03 DIAGNOSIS — D509 Iron deficiency anemia, unspecified: Secondary | ICD-10-CM | POA: Diagnosis not present

## 2023-11-03 DIAGNOSIS — M5442 Lumbago with sciatica, left side: Secondary | ICD-10-CM | POA: Diagnosis not present

## 2023-11-03 DIAGNOSIS — M6281 Muscle weakness (generalized): Secondary | ICD-10-CM | POA: Diagnosis not present

## 2023-11-03 DIAGNOSIS — F331 Major depressive disorder, recurrent, moderate: Secondary | ICD-10-CM | POA: Diagnosis not present

## 2023-11-05 DIAGNOSIS — E785 Hyperlipidemia, unspecified: Secondary | ICD-10-CM | POA: Diagnosis not present

## 2023-11-05 DIAGNOSIS — G629 Polyneuropathy, unspecified: Secondary | ICD-10-CM | POA: Diagnosis not present

## 2023-11-05 DIAGNOSIS — M810 Age-related osteoporosis without current pathological fracture: Secondary | ICD-10-CM | POA: Diagnosis not present

## 2023-11-05 DIAGNOSIS — K219 Gastro-esophageal reflux disease without esophagitis: Secondary | ICD-10-CM | POA: Diagnosis not present

## 2023-11-05 DIAGNOSIS — M6281 Muscle weakness (generalized): Secondary | ICD-10-CM | POA: Diagnosis not present

## 2023-11-05 DIAGNOSIS — R2681 Unsteadiness on feet: Secondary | ICD-10-CM | POA: Diagnosis not present

## 2023-11-05 DIAGNOSIS — N183 Chronic kidney disease, stage 3 unspecified: Secondary | ICD-10-CM | POA: Diagnosis not present

## 2023-11-05 DIAGNOSIS — M170 Bilateral primary osteoarthritis of knee: Secondary | ICD-10-CM | POA: Diagnosis not present

## 2023-11-05 DIAGNOSIS — E559 Vitamin D deficiency, unspecified: Secondary | ICD-10-CM | POA: Diagnosis not present

## 2023-11-05 DIAGNOSIS — Z9181 History of falling: Secondary | ICD-10-CM | POA: Diagnosis not present

## 2023-11-05 DIAGNOSIS — G20A1 Parkinson's disease without dyskinesia, without mention of fluctuations: Secondary | ICD-10-CM | POA: Diagnosis not present

## 2023-11-06 DIAGNOSIS — G20A1 Parkinson's disease without dyskinesia, without mention of fluctuations: Secondary | ICD-10-CM | POA: Diagnosis not present

## 2023-11-06 DIAGNOSIS — E785 Hyperlipidemia, unspecified: Secondary | ICD-10-CM | POA: Diagnosis not present

## 2023-11-06 DIAGNOSIS — E559 Vitamin D deficiency, unspecified: Secondary | ICD-10-CM | POA: Diagnosis not present

## 2023-11-06 DIAGNOSIS — M170 Bilateral primary osteoarthritis of knee: Secondary | ICD-10-CM | POA: Diagnosis not present

## 2023-11-06 DIAGNOSIS — Z9181 History of falling: Secondary | ICD-10-CM | POA: Diagnosis not present

## 2023-11-06 DIAGNOSIS — N183 Chronic kidney disease, stage 3 unspecified: Secondary | ICD-10-CM | POA: Diagnosis not present

## 2023-11-06 DIAGNOSIS — M810 Age-related osteoporosis without current pathological fracture: Secondary | ICD-10-CM | POA: Diagnosis not present

## 2023-11-06 DIAGNOSIS — K219 Gastro-esophageal reflux disease without esophagitis: Secondary | ICD-10-CM | POA: Diagnosis not present

## 2023-11-06 DIAGNOSIS — G629 Polyneuropathy, unspecified: Secondary | ICD-10-CM | POA: Diagnosis not present

## 2023-11-10 DIAGNOSIS — K219 Gastro-esophageal reflux disease without esophagitis: Secondary | ICD-10-CM | POA: Diagnosis not present

## 2023-11-10 DIAGNOSIS — M810 Age-related osteoporosis without current pathological fracture: Secondary | ICD-10-CM | POA: Diagnosis not present

## 2023-11-10 DIAGNOSIS — E785 Hyperlipidemia, unspecified: Secondary | ICD-10-CM | POA: Diagnosis not present

## 2023-11-10 DIAGNOSIS — N183 Chronic kidney disease, stage 3 unspecified: Secondary | ICD-10-CM | POA: Diagnosis not present

## 2023-11-10 DIAGNOSIS — Z9181 History of falling: Secondary | ICD-10-CM | POA: Diagnosis not present

## 2023-11-10 DIAGNOSIS — M170 Bilateral primary osteoarthritis of knee: Secondary | ICD-10-CM | POA: Diagnosis not present

## 2023-11-10 DIAGNOSIS — G629 Polyneuropathy, unspecified: Secondary | ICD-10-CM | POA: Diagnosis not present

## 2023-11-10 DIAGNOSIS — G20A1 Parkinson's disease without dyskinesia, without mention of fluctuations: Secondary | ICD-10-CM | POA: Diagnosis not present

## 2023-11-10 DIAGNOSIS — E559 Vitamin D deficiency, unspecified: Secondary | ICD-10-CM | POA: Diagnosis not present

## 2023-11-13 DIAGNOSIS — Z9181 History of falling: Secondary | ICD-10-CM | POA: Diagnosis not present

## 2023-11-13 DIAGNOSIS — M170 Bilateral primary osteoarthritis of knee: Secondary | ICD-10-CM | POA: Diagnosis not present

## 2023-11-13 DIAGNOSIS — M810 Age-related osteoporosis without current pathological fracture: Secondary | ICD-10-CM | POA: Diagnosis not present

## 2023-11-13 DIAGNOSIS — K219 Gastro-esophageal reflux disease without esophagitis: Secondary | ICD-10-CM | POA: Diagnosis not present

## 2023-11-13 DIAGNOSIS — N183 Chronic kidney disease, stage 3 unspecified: Secondary | ICD-10-CM | POA: Diagnosis not present

## 2023-11-13 DIAGNOSIS — E559 Vitamin D deficiency, unspecified: Secondary | ICD-10-CM | POA: Diagnosis not present

## 2023-11-13 DIAGNOSIS — E785 Hyperlipidemia, unspecified: Secondary | ICD-10-CM | POA: Diagnosis not present

## 2023-11-13 DIAGNOSIS — G20A1 Parkinson's disease without dyskinesia, without mention of fluctuations: Secondary | ICD-10-CM | POA: Diagnosis not present

## 2023-11-13 DIAGNOSIS — G629 Polyneuropathy, unspecified: Secondary | ICD-10-CM | POA: Diagnosis not present

## 2023-11-17 DIAGNOSIS — M4306 Spondylolysis, lumbar region: Secondary | ICD-10-CM | POA: Diagnosis not present

## 2023-11-17 DIAGNOSIS — M7072 Other bursitis of hip, left hip: Secondary | ICD-10-CM | POA: Diagnosis not present

## 2023-11-17 DIAGNOSIS — M25552 Pain in left hip: Secondary | ICD-10-CM | POA: Diagnosis not present

## 2023-11-19 DIAGNOSIS — E559 Vitamin D deficiency, unspecified: Secondary | ICD-10-CM | POA: Diagnosis not present

## 2023-11-19 DIAGNOSIS — G629 Polyneuropathy, unspecified: Secondary | ICD-10-CM | POA: Diagnosis not present

## 2023-11-19 DIAGNOSIS — F331 Major depressive disorder, recurrent, moderate: Secondary | ICD-10-CM | POA: Diagnosis not present

## 2023-11-19 DIAGNOSIS — M810 Age-related osteoporosis without current pathological fracture: Secondary | ICD-10-CM | POA: Diagnosis not present

## 2023-11-19 DIAGNOSIS — G20A1 Parkinson's disease without dyskinesia, without mention of fluctuations: Secondary | ICD-10-CM | POA: Diagnosis not present

## 2023-11-19 DIAGNOSIS — D509 Iron deficiency anemia, unspecified: Secondary | ICD-10-CM | POA: Diagnosis not present

## 2023-11-19 DIAGNOSIS — M170 Bilateral primary osteoarthritis of knee: Secondary | ICD-10-CM | POA: Diagnosis not present

## 2023-11-19 DIAGNOSIS — N183 Chronic kidney disease, stage 3 unspecified: Secondary | ICD-10-CM | POA: Diagnosis not present

## 2023-11-19 DIAGNOSIS — F5101 Primary insomnia: Secondary | ICD-10-CM | POA: Diagnosis not present

## 2023-11-19 DIAGNOSIS — M5442 Lumbago with sciatica, left side: Secondary | ICD-10-CM | POA: Diagnosis not present

## 2023-11-19 DIAGNOSIS — E785 Hyperlipidemia, unspecified: Secondary | ICD-10-CM | POA: Diagnosis not present

## 2023-11-19 DIAGNOSIS — Z9181 History of falling: Secondary | ICD-10-CM | POA: Diagnosis not present

## 2023-11-19 DIAGNOSIS — G894 Chronic pain syndrome: Secondary | ICD-10-CM | POA: Diagnosis not present

## 2023-11-19 DIAGNOSIS — K219 Gastro-esophageal reflux disease without esophagitis: Secondary | ICD-10-CM | POA: Diagnosis not present

## 2023-11-23 DIAGNOSIS — G629 Polyneuropathy, unspecified: Secondary | ICD-10-CM | POA: Diagnosis not present

## 2023-11-23 DIAGNOSIS — K219 Gastro-esophageal reflux disease without esophagitis: Secondary | ICD-10-CM | POA: Diagnosis not present

## 2023-11-23 DIAGNOSIS — G20A1 Parkinson's disease without dyskinesia, without mention of fluctuations: Secondary | ICD-10-CM | POA: Diagnosis not present

## 2023-11-23 DIAGNOSIS — Z9181 History of falling: Secondary | ICD-10-CM | POA: Diagnosis not present

## 2023-11-23 DIAGNOSIS — N183 Chronic kidney disease, stage 3 unspecified: Secondary | ICD-10-CM | POA: Diagnosis not present

## 2023-11-23 DIAGNOSIS — M170 Bilateral primary osteoarthritis of knee: Secondary | ICD-10-CM | POA: Diagnosis not present

## 2023-11-23 DIAGNOSIS — M810 Age-related osteoporosis without current pathological fracture: Secondary | ICD-10-CM | POA: Diagnosis not present

## 2023-11-23 DIAGNOSIS — E559 Vitamin D deficiency, unspecified: Secondary | ICD-10-CM | POA: Diagnosis not present

## 2023-11-23 DIAGNOSIS — E785 Hyperlipidemia, unspecified: Secondary | ICD-10-CM | POA: Diagnosis not present

## 2023-11-24 DIAGNOSIS — G629 Polyneuropathy, unspecified: Secondary | ICD-10-CM | POA: Diagnosis not present

## 2023-11-24 DIAGNOSIS — N183 Chronic kidney disease, stage 3 unspecified: Secondary | ICD-10-CM | POA: Diagnosis not present

## 2023-11-24 DIAGNOSIS — M810 Age-related osteoporosis without current pathological fracture: Secondary | ICD-10-CM | POA: Diagnosis not present

## 2023-11-24 DIAGNOSIS — M170 Bilateral primary osteoarthritis of knee: Secondary | ICD-10-CM | POA: Diagnosis not present

## 2023-11-24 DIAGNOSIS — E785 Hyperlipidemia, unspecified: Secondary | ICD-10-CM | POA: Diagnosis not present

## 2023-11-24 DIAGNOSIS — Z9181 History of falling: Secondary | ICD-10-CM | POA: Diagnosis not present

## 2023-11-24 DIAGNOSIS — K219 Gastro-esophageal reflux disease without esophagitis: Secondary | ICD-10-CM | POA: Diagnosis not present

## 2023-11-24 DIAGNOSIS — E559 Vitamin D deficiency, unspecified: Secondary | ICD-10-CM | POA: Diagnosis not present

## 2023-11-24 DIAGNOSIS — G20A1 Parkinson's disease without dyskinesia, without mention of fluctuations: Secondary | ICD-10-CM | POA: Diagnosis not present

## 2023-11-26 DIAGNOSIS — G894 Chronic pain syndrome: Secondary | ICD-10-CM | POA: Diagnosis not present

## 2023-11-26 DIAGNOSIS — F5101 Primary insomnia: Secondary | ICD-10-CM | POA: Diagnosis not present

## 2023-11-26 DIAGNOSIS — F331 Major depressive disorder, recurrent, moderate: Secondary | ICD-10-CM | POA: Diagnosis not present

## 2023-11-26 DIAGNOSIS — M810 Age-related osteoporosis without current pathological fracture: Secondary | ICD-10-CM | POA: Diagnosis not present

## 2023-11-26 DIAGNOSIS — M7072 Other bursitis of hip, left hip: Secondary | ICD-10-CM | POA: Diagnosis not present

## 2023-11-26 DIAGNOSIS — M170 Bilateral primary osteoarthritis of knee: Secondary | ICD-10-CM | POA: Diagnosis not present

## 2023-11-26 DIAGNOSIS — D509 Iron deficiency anemia, unspecified: Secondary | ICD-10-CM | POA: Diagnosis not present

## 2023-12-01 DIAGNOSIS — M170 Bilateral primary osteoarthritis of knee: Secondary | ICD-10-CM | POA: Diagnosis not present

## 2023-12-01 DIAGNOSIS — Z9181 History of falling: Secondary | ICD-10-CM | POA: Diagnosis not present

## 2023-12-01 DIAGNOSIS — E785 Hyperlipidemia, unspecified: Secondary | ICD-10-CM | POA: Diagnosis not present

## 2023-12-01 DIAGNOSIS — G20A1 Parkinson's disease without dyskinesia, without mention of fluctuations: Secondary | ICD-10-CM | POA: Diagnosis not present

## 2023-12-01 DIAGNOSIS — N183 Chronic kidney disease, stage 3 unspecified: Secondary | ICD-10-CM | POA: Diagnosis not present

## 2023-12-01 DIAGNOSIS — F5101 Primary insomnia: Secondary | ICD-10-CM | POA: Diagnosis not present

## 2023-12-01 DIAGNOSIS — M810 Age-related osteoporosis without current pathological fracture: Secondary | ICD-10-CM | POA: Diagnosis not present

## 2023-12-01 DIAGNOSIS — K219 Gastro-esophageal reflux disease without esophagitis: Secondary | ICD-10-CM | POA: Diagnosis not present

## 2023-12-01 DIAGNOSIS — G894 Chronic pain syndrome: Secondary | ICD-10-CM | POA: Diagnosis not present

## 2023-12-01 DIAGNOSIS — M5442 Lumbago with sciatica, left side: Secondary | ICD-10-CM | POA: Diagnosis not present

## 2023-12-01 DIAGNOSIS — E559 Vitamin D deficiency, unspecified: Secondary | ICD-10-CM | POA: Diagnosis not present

## 2023-12-01 DIAGNOSIS — G629 Polyneuropathy, unspecified: Secondary | ICD-10-CM | POA: Diagnosis not present

## 2023-12-01 DIAGNOSIS — F331 Major depressive disorder, recurrent, moderate: Secondary | ICD-10-CM | POA: Diagnosis not present

## 2023-12-01 DIAGNOSIS — D509 Iron deficiency anemia, unspecified: Secondary | ICD-10-CM | POA: Diagnosis not present

## 2024-10-02 ENCOUNTER — Other Ambulatory Visit: Payer: Self-pay | Admitting: Orthopedic Surgery
